# Patient Record
Sex: Female | Born: 1975 | Race: Black or African American | Hispanic: No | Marital: Married | Smoking: Never smoker
Health system: Southern US, Community
[De-identification: ages and names within clinical notes are randomized; demographics above are authoritative.]

## PROBLEM LIST (undated history)

## (undated) ENCOUNTER — Emergency Department (HOSPITAL_COMMUNITY): Admission: EM | Payer: Medicaid Other | Source: Home / Self Care

## (undated) DIAGNOSIS — M549 Dorsalgia, unspecified: Secondary | ICD-10-CM

## (undated) DIAGNOSIS — Z21 Asymptomatic human immunodeficiency virus [HIV] infection status: Secondary | ICD-10-CM

## (undated) DIAGNOSIS — E1129 Type 2 diabetes mellitus with other diabetic kidney complication: Secondary | ICD-10-CM

## (undated) DIAGNOSIS — R809 Proteinuria, unspecified: Secondary | ICD-10-CM

## (undated) DIAGNOSIS — B20 Human immunodeficiency virus [HIV] disease: Secondary | ICD-10-CM

## (undated) DIAGNOSIS — D849 Immunodeficiency, unspecified: Secondary | ICD-10-CM

## (undated) DIAGNOSIS — K8020 Calculus of gallbladder without cholecystitis without obstruction: Secondary | ICD-10-CM

## (undated) DIAGNOSIS — E119 Type 2 diabetes mellitus without complications: Secondary | ICD-10-CM

## (undated) HISTORY — DX: Type 2 diabetes mellitus without complications: E11.9

## (undated) HISTORY — DX: Dorsalgia, unspecified: M54.9

## (undated) HISTORY — PX: TUBAL LIGATION: SHX77

## (undated) HISTORY — DX: Asymptomatic human immunodeficiency virus (hiv) infection status: Z21

## (undated) HISTORY — DX: Type 2 diabetes mellitus with other diabetic kidney complication: R80.9

## (undated) HISTORY — DX: Type 2 diabetes mellitus with other diabetic kidney complication: E11.29

## (undated) HISTORY — PX: NO PAST SURGERIES: SHX2092

## (undated) HISTORY — DX: Calculus of gallbladder without cholecystitis without obstruction: K80.20

## (undated) HISTORY — DX: Human immunodeficiency virus (HIV) disease: B20

---

## 1998-10-16 ENCOUNTER — Inpatient Hospital Stay (HOSPITAL_COMMUNITY): Admission: AD | Admit: 1998-10-16 | Discharge: 1998-10-16 | Payer: Self-pay | Admitting: *Deleted

## 1998-12-11 ENCOUNTER — Ambulatory Visit (HOSPITAL_COMMUNITY): Admission: RE | Admit: 1998-12-11 | Discharge: 1998-12-11 | Payer: Self-pay | Admitting: *Deleted

## 1999-01-01 ENCOUNTER — Inpatient Hospital Stay (HOSPITAL_COMMUNITY): Admission: AD | Admit: 1999-01-01 | Discharge: 1999-01-01 | Payer: Self-pay | Admitting: Obstetrics

## 1999-01-28 ENCOUNTER — Encounter: Payer: Self-pay | Admitting: *Deleted

## 1999-01-28 ENCOUNTER — Inpatient Hospital Stay (HOSPITAL_COMMUNITY): Admission: AD | Admit: 1999-01-28 | Discharge: 1999-01-28 | Payer: Self-pay | Admitting: *Deleted

## 1999-01-31 ENCOUNTER — Encounter (HOSPITAL_COMMUNITY): Admission: RE | Admit: 1999-01-31 | Discharge: 1999-05-01 | Payer: Self-pay | Admitting: *Deleted

## 1999-02-06 ENCOUNTER — Encounter: Admission: RE | Admit: 1999-02-06 | Discharge: 1999-02-06 | Payer: Self-pay | Admitting: Obstetrics & Gynecology

## 1999-02-12 ENCOUNTER — Encounter: Admission: RE | Admit: 1999-02-12 | Discharge: 1999-02-12 | Payer: Self-pay | Admitting: Obstetrics & Gynecology

## 1999-02-19 ENCOUNTER — Encounter: Admission: RE | Admit: 1999-02-19 | Discharge: 1999-02-19 | Payer: Self-pay | Admitting: Obstetrics & Gynecology

## 1999-02-24 ENCOUNTER — Inpatient Hospital Stay (HOSPITAL_COMMUNITY): Admission: AD | Admit: 1999-02-24 | Discharge: 1999-02-24 | Payer: Self-pay | Admitting: Obstetrics & Gynecology

## 1999-02-24 ENCOUNTER — Encounter: Payer: Self-pay | Admitting: Obstetrics & Gynecology

## 1999-02-26 ENCOUNTER — Encounter: Admission: RE | Admit: 1999-02-26 | Discharge: 1999-02-26 | Payer: Self-pay | Admitting: Obstetrics & Gynecology

## 1999-02-28 ENCOUNTER — Inpatient Hospital Stay (HOSPITAL_COMMUNITY): Admission: AD | Admit: 1999-02-28 | Discharge: 1999-02-28 | Payer: Self-pay | Admitting: Obstetrics & Gynecology

## 1999-03-05 ENCOUNTER — Encounter: Admission: RE | Admit: 1999-03-05 | Discharge: 1999-03-05 | Payer: Self-pay | Admitting: Obstetrics & Gynecology

## 1999-03-06 ENCOUNTER — Inpatient Hospital Stay (HOSPITAL_COMMUNITY): Admission: AD | Admit: 1999-03-06 | Discharge: 1999-03-06 | Payer: Self-pay | Admitting: Obstetrics & Gynecology

## 1999-03-12 ENCOUNTER — Encounter: Admission: RE | Admit: 1999-03-12 | Discharge: 1999-03-12 | Payer: Self-pay | Admitting: Obstetrics & Gynecology

## 1999-03-26 ENCOUNTER — Encounter: Admission: RE | Admit: 1999-03-26 | Discharge: 1999-03-26 | Payer: Self-pay | Admitting: Obstetrics & Gynecology

## 1999-04-09 ENCOUNTER — Encounter: Admission: RE | Admit: 1999-04-09 | Discharge: 1999-04-09 | Payer: Self-pay | Admitting: Obstetrics & Gynecology

## 1999-04-16 ENCOUNTER — Encounter: Admission: RE | Admit: 1999-04-16 | Discharge: 1999-04-16 | Payer: Self-pay | Admitting: Obstetrics & Gynecology

## 1999-05-07 ENCOUNTER — Encounter: Admission: RE | Admit: 1999-05-07 | Discharge: 1999-05-07 | Payer: Self-pay | Admitting: Obstetrics & Gynecology

## 1999-05-12 ENCOUNTER — Inpatient Hospital Stay (HOSPITAL_COMMUNITY): Admission: AD | Admit: 1999-05-12 | Discharge: 1999-05-17 | Payer: Self-pay | Admitting: Obstetrics

## 1999-05-12 ENCOUNTER — Encounter (INDEPENDENT_AMBULATORY_CARE_PROVIDER_SITE_OTHER): Payer: Self-pay

## 1999-06-05 ENCOUNTER — Inpatient Hospital Stay (HOSPITAL_COMMUNITY): Admission: AD | Admit: 1999-06-05 | Discharge: 1999-06-05 | Payer: Self-pay | Admitting: Obstetrics & Gynecology

## 2000-02-02 ENCOUNTER — Inpatient Hospital Stay (HOSPITAL_COMMUNITY): Admission: AD | Admit: 2000-02-02 | Discharge: 2000-02-02 | Payer: Self-pay | Admitting: Obstetrics

## 2000-02-14 ENCOUNTER — Inpatient Hospital Stay (HOSPITAL_COMMUNITY): Admission: AD | Admit: 2000-02-14 | Discharge: 2000-02-14 | Payer: Self-pay | Admitting: Obstetrics

## 2000-03-19 ENCOUNTER — Inpatient Hospital Stay (HOSPITAL_COMMUNITY): Admission: EM | Admit: 2000-03-19 | Discharge: 2000-03-19 | Payer: Self-pay | Admitting: *Deleted

## 2000-04-01 ENCOUNTER — Inpatient Hospital Stay (HOSPITAL_COMMUNITY): Admission: AD | Admit: 2000-04-01 | Discharge: 2000-04-01 | Payer: Self-pay | Admitting: Obstetrics & Gynecology

## 2000-06-10 ENCOUNTER — Inpatient Hospital Stay (HOSPITAL_COMMUNITY): Admission: AD | Admit: 2000-06-10 | Discharge: 2000-06-10 | Payer: Self-pay | Admitting: Obstetrics & Gynecology

## 2000-07-12 ENCOUNTER — Inpatient Hospital Stay (HOSPITAL_COMMUNITY): Admission: AD | Admit: 2000-07-12 | Discharge: 2000-07-12 | Payer: Self-pay | Admitting: Obstetrics

## 2000-10-04 ENCOUNTER — Inpatient Hospital Stay (HOSPITAL_COMMUNITY): Admission: AD | Admit: 2000-10-04 | Discharge: 2000-10-04 | Payer: Self-pay | Admitting: Obstetrics

## 2000-10-08 ENCOUNTER — Inpatient Hospital Stay (HOSPITAL_COMMUNITY): Admission: AD | Admit: 2000-10-08 | Discharge: 2000-10-08 | Payer: Self-pay | Admitting: Obstetrics & Gynecology

## 2000-11-04 ENCOUNTER — Inpatient Hospital Stay (HOSPITAL_COMMUNITY): Admission: AD | Admit: 2000-11-04 | Discharge: 2000-11-04 | Payer: Self-pay | Admitting: Obstetrics & Gynecology

## 2001-01-19 ENCOUNTER — Inpatient Hospital Stay (HOSPITAL_COMMUNITY): Admission: AD | Admit: 2001-01-19 | Discharge: 2001-01-19 | Payer: Self-pay | Admitting: *Deleted

## 2001-01-26 ENCOUNTER — Inpatient Hospital Stay (HOSPITAL_COMMUNITY): Admission: AD | Admit: 2001-01-26 | Discharge: 2001-01-26 | Payer: Self-pay | Admitting: Obstetrics & Gynecology

## 2001-02-09 ENCOUNTER — Emergency Department (HOSPITAL_COMMUNITY): Admission: EM | Admit: 2001-02-09 | Discharge: 2001-02-09 | Payer: Self-pay | Admitting: Emergency Medicine

## 2001-03-18 ENCOUNTER — Inpatient Hospital Stay (HOSPITAL_COMMUNITY): Admission: AD | Admit: 2001-03-18 | Discharge: 2001-03-18 | Payer: Self-pay | Admitting: *Deleted

## 2001-03-18 ENCOUNTER — Encounter: Payer: Self-pay | Admitting: Obstetrics & Gynecology

## 2001-03-25 ENCOUNTER — Inpatient Hospital Stay (HOSPITAL_COMMUNITY): Admission: AD | Admit: 2001-03-25 | Discharge: 2001-03-25 | Payer: Self-pay | Admitting: Obstetrics & Gynecology

## 2001-04-01 ENCOUNTER — Inpatient Hospital Stay (HOSPITAL_COMMUNITY): Admission: RE | Admit: 2001-04-01 | Discharge: 2001-04-01 | Payer: Self-pay | Admitting: Obstetrics & Gynecology

## 2001-04-01 ENCOUNTER — Encounter: Payer: Self-pay | Admitting: Obstetrics & Gynecology

## 2001-04-12 ENCOUNTER — Inpatient Hospital Stay (HOSPITAL_COMMUNITY): Admission: AD | Admit: 2001-04-12 | Discharge: 2001-04-12 | Payer: Self-pay | Admitting: *Deleted

## 2001-05-09 ENCOUNTER — Inpatient Hospital Stay (HOSPITAL_COMMUNITY): Admission: AD | Admit: 2001-05-09 | Discharge: 2001-05-09 | Payer: Self-pay | Admitting: Obstetrics

## 2001-05-26 ENCOUNTER — Inpatient Hospital Stay (HOSPITAL_COMMUNITY): Admission: AD | Admit: 2001-05-26 | Discharge: 2001-05-26 | Payer: Self-pay | Admitting: *Deleted

## 2001-06-06 ENCOUNTER — Inpatient Hospital Stay (HOSPITAL_COMMUNITY): Admission: AD | Admit: 2001-06-06 | Discharge: 2001-06-06 | Payer: Self-pay

## 2001-06-14 ENCOUNTER — Inpatient Hospital Stay (HOSPITAL_COMMUNITY): Admission: AD | Admit: 2001-06-14 | Discharge: 2001-06-14 | Payer: Self-pay | Admitting: *Deleted

## 2001-06-17 ENCOUNTER — Ambulatory Visit (HOSPITAL_COMMUNITY): Admission: RE | Admit: 2001-06-17 | Discharge: 2001-06-17 | Payer: Self-pay | Admitting: *Deleted

## 2001-07-04 ENCOUNTER — Inpatient Hospital Stay (HOSPITAL_COMMUNITY): Admission: AD | Admit: 2001-07-04 | Discharge: 2001-07-04 | Payer: Self-pay | Admitting: Obstetrics

## 2001-07-08 ENCOUNTER — Inpatient Hospital Stay (HOSPITAL_COMMUNITY): Admission: AD | Admit: 2001-07-08 | Discharge: 2001-07-08 | Payer: Self-pay | Admitting: Obstetrics & Gynecology

## 2001-08-22 ENCOUNTER — Inpatient Hospital Stay (HOSPITAL_COMMUNITY): Admission: AD | Admit: 2001-08-22 | Discharge: 2001-08-22 | Payer: Self-pay | Admitting: Obstetrics

## 2001-09-07 ENCOUNTER — Inpatient Hospital Stay (HOSPITAL_COMMUNITY): Admission: AD | Admit: 2001-09-07 | Discharge: 2001-09-07 | Payer: Self-pay | Admitting: Obstetrics

## 2001-09-20 ENCOUNTER — Inpatient Hospital Stay (HOSPITAL_COMMUNITY): Admission: AD | Admit: 2001-09-20 | Discharge: 2001-09-20 | Payer: Self-pay | Admitting: *Deleted

## 2001-09-22 ENCOUNTER — Inpatient Hospital Stay (HOSPITAL_COMMUNITY): Admission: AD | Admit: 2001-09-22 | Discharge: 2001-09-22 | Payer: Self-pay | Admitting: *Deleted

## 2001-09-27 ENCOUNTER — Inpatient Hospital Stay (HOSPITAL_COMMUNITY): Admission: AD | Admit: 2001-09-27 | Discharge: 2001-09-27 | Payer: Self-pay | Admitting: *Deleted

## 2001-09-27 ENCOUNTER — Encounter: Payer: Self-pay | Admitting: *Deleted

## 2001-10-17 ENCOUNTER — Inpatient Hospital Stay (HOSPITAL_COMMUNITY): Admission: AD | Admit: 2001-10-17 | Discharge: 2001-10-17 | Payer: Self-pay | Admitting: *Deleted

## 2001-10-20 ENCOUNTER — Inpatient Hospital Stay (HOSPITAL_COMMUNITY): Admission: AD | Admit: 2001-10-20 | Discharge: 2001-10-20 | Payer: Self-pay | Admitting: *Deleted

## 2001-10-25 ENCOUNTER — Encounter (HOSPITAL_COMMUNITY): Admission: AD | Admit: 2001-10-25 | Discharge: 2001-11-08 | Payer: Self-pay | Admitting: *Deleted

## 2001-10-27 ENCOUNTER — Inpatient Hospital Stay (HOSPITAL_COMMUNITY): Admission: AD | Admit: 2001-10-27 | Discharge: 2001-10-27 | Payer: Self-pay | Admitting: *Deleted

## 2001-10-28 ENCOUNTER — Ambulatory Visit (HOSPITAL_COMMUNITY): Admission: RE | Admit: 2001-10-28 | Discharge: 2001-10-28 | Payer: Self-pay | Admitting: *Deleted

## 2001-10-28 ENCOUNTER — Encounter: Payer: Self-pay | Admitting: *Deleted

## 2001-11-09 ENCOUNTER — Inpatient Hospital Stay (HOSPITAL_COMMUNITY): Admission: AD | Admit: 2001-11-09 | Discharge: 2001-11-12 | Payer: Self-pay | Admitting: *Deleted

## 2001-11-09 ENCOUNTER — Encounter (INDEPENDENT_AMBULATORY_CARE_PROVIDER_SITE_OTHER): Payer: Self-pay

## 2001-11-17 ENCOUNTER — Inpatient Hospital Stay (HOSPITAL_COMMUNITY): Admission: AD | Admit: 2001-11-17 | Discharge: 2001-11-17 | Payer: Self-pay | Admitting: *Deleted

## 2001-12-13 ENCOUNTER — Inpatient Hospital Stay (HOSPITAL_COMMUNITY): Admission: AD | Admit: 2001-12-13 | Discharge: 2001-12-13 | Payer: Self-pay | Admitting: *Deleted

## 2002-05-15 ENCOUNTER — Inpatient Hospital Stay (HOSPITAL_COMMUNITY): Admission: AD | Admit: 2002-05-15 | Discharge: 2002-05-15 | Payer: Self-pay | Admitting: *Deleted

## 2002-06-13 ENCOUNTER — Inpatient Hospital Stay (HOSPITAL_COMMUNITY): Admission: AD | Admit: 2002-06-13 | Discharge: 2002-06-13 | Payer: Self-pay | Admitting: *Deleted

## 2002-07-06 ENCOUNTER — Inpatient Hospital Stay (HOSPITAL_COMMUNITY): Admission: AD | Admit: 2002-07-06 | Discharge: 2002-07-06 | Payer: Self-pay | Admitting: *Deleted

## 2002-07-12 ENCOUNTER — Emergency Department (HOSPITAL_COMMUNITY): Admission: EM | Admit: 2002-07-12 | Discharge: 2002-07-12 | Payer: Self-pay | Admitting: Emergency Medicine

## 2002-07-25 ENCOUNTER — Inpatient Hospital Stay (HOSPITAL_COMMUNITY): Admission: AD | Admit: 2002-07-25 | Discharge: 2002-07-25 | Payer: Self-pay | Admitting: *Deleted

## 2002-08-08 ENCOUNTER — Inpatient Hospital Stay (HOSPITAL_COMMUNITY): Admission: AD | Admit: 2002-08-08 | Discharge: 2002-08-08 | Payer: Self-pay | Admitting: *Deleted

## 2002-09-04 ENCOUNTER — Inpatient Hospital Stay (HOSPITAL_COMMUNITY): Admission: AD | Admit: 2002-09-04 | Discharge: 2002-09-04 | Payer: Self-pay | Admitting: *Deleted

## 2002-10-06 ENCOUNTER — Ambulatory Visit (HOSPITAL_COMMUNITY): Admission: RE | Admit: 2002-10-06 | Discharge: 2002-10-06 | Payer: Self-pay | Admitting: *Deleted

## 2002-11-20 ENCOUNTER — Other Ambulatory Visit: Admission: RE | Admit: 2002-11-20 | Discharge: 2002-11-20 | Payer: Self-pay | Admitting: Obstetrics and Gynecology

## 2003-01-24 ENCOUNTER — Inpatient Hospital Stay (HOSPITAL_COMMUNITY): Admission: AD | Admit: 2003-01-24 | Discharge: 2003-01-24 | Payer: Self-pay | Admitting: *Deleted

## 2003-01-27 ENCOUNTER — Inpatient Hospital Stay (HOSPITAL_COMMUNITY): Admission: AD | Admit: 2003-01-27 | Discharge: 2003-01-27 | Payer: Self-pay | Admitting: Obstetrics and Gynecology

## 2003-02-02 ENCOUNTER — Inpatient Hospital Stay (HOSPITAL_COMMUNITY): Admission: AD | Admit: 2003-02-02 | Discharge: 2003-02-02 | Payer: Self-pay | Admitting: Obstetrics and Gynecology

## 2003-02-07 ENCOUNTER — Inpatient Hospital Stay (HOSPITAL_COMMUNITY): Admission: AD | Admit: 2003-02-07 | Discharge: 2003-02-07 | Payer: Self-pay | Admitting: Obstetrics and Gynecology

## 2003-02-26 ENCOUNTER — Ambulatory Visit (HOSPITAL_COMMUNITY): Admission: RE | Admit: 2003-02-26 | Discharge: 2003-02-26 | Payer: Self-pay | Admitting: Obstetrics and Gynecology

## 2003-02-26 ENCOUNTER — Encounter: Payer: Self-pay | Admitting: Obstetrics and Gynecology

## 2003-03-14 ENCOUNTER — Inpatient Hospital Stay (HOSPITAL_COMMUNITY): Admission: AD | Admit: 2003-03-14 | Discharge: 2003-03-14 | Payer: Self-pay | Admitting: Obstetrics and Gynecology

## 2003-04-02 ENCOUNTER — Inpatient Hospital Stay (HOSPITAL_COMMUNITY): Admission: AD | Admit: 2003-04-02 | Discharge: 2003-04-02 | Payer: Self-pay | Admitting: Obstetrics and Gynecology

## 2003-04-11 ENCOUNTER — Inpatient Hospital Stay (HOSPITAL_COMMUNITY): Admission: AD | Admit: 2003-04-11 | Discharge: 2003-04-11 | Payer: Self-pay | Admitting: Obstetrics and Gynecology

## 2003-04-23 ENCOUNTER — Inpatient Hospital Stay (HOSPITAL_COMMUNITY): Admission: AD | Admit: 2003-04-23 | Discharge: 2003-04-23 | Payer: Self-pay | Admitting: Obstetrics and Gynecology

## 2003-04-24 ENCOUNTER — Inpatient Hospital Stay (HOSPITAL_COMMUNITY): Admission: AD | Admit: 2003-04-24 | Discharge: 2003-04-24 | Payer: Self-pay | Admitting: Obstetrics and Gynecology

## 2003-04-26 ENCOUNTER — Inpatient Hospital Stay (HOSPITAL_COMMUNITY): Admission: AD | Admit: 2003-04-26 | Discharge: 2003-05-01 | Payer: Self-pay | Admitting: Otolaryngology

## 2003-04-27 ENCOUNTER — Encounter (INDEPENDENT_AMBULATORY_CARE_PROVIDER_SITE_OTHER): Payer: Self-pay | Admitting: Specialist

## 2003-06-05 ENCOUNTER — Other Ambulatory Visit: Admission: RE | Admit: 2003-06-05 | Discharge: 2003-06-05 | Payer: Self-pay | Admitting: Obstetrics and Gynecology

## 2003-11-03 ENCOUNTER — Emergency Department (HOSPITAL_COMMUNITY): Admission: EM | Admit: 2003-11-03 | Discharge: 2003-11-04 | Payer: Self-pay | Admitting: Emergency Medicine

## 2003-11-09 ENCOUNTER — Inpatient Hospital Stay (HOSPITAL_COMMUNITY): Admission: AD | Admit: 2003-11-09 | Discharge: 2003-11-09 | Payer: Self-pay | Admitting: *Deleted

## 2004-02-20 ENCOUNTER — Inpatient Hospital Stay (HOSPITAL_COMMUNITY): Admission: AD | Admit: 2004-02-20 | Discharge: 2004-02-20 | Payer: Self-pay | Admitting: Obstetrics and Gynecology

## 2004-05-13 ENCOUNTER — Emergency Department (HOSPITAL_COMMUNITY): Admission: EM | Admit: 2004-05-13 | Discharge: 2004-05-13 | Payer: Self-pay | Admitting: Emergency Medicine

## 2004-08-04 ENCOUNTER — Inpatient Hospital Stay (HOSPITAL_COMMUNITY): Admission: AD | Admit: 2004-08-04 | Discharge: 2004-08-04 | Payer: Self-pay | Admitting: Obstetrics & Gynecology

## 2004-09-11 ENCOUNTER — Emergency Department (HOSPITAL_COMMUNITY): Admission: EM | Admit: 2004-09-11 | Discharge: 2004-09-11 | Payer: Self-pay | Admitting: Emergency Medicine

## 2004-10-01 ENCOUNTER — Emergency Department (HOSPITAL_COMMUNITY): Admission: EM | Admit: 2004-10-01 | Discharge: 2004-10-01 | Payer: Self-pay | Admitting: Family Medicine

## 2004-10-15 ENCOUNTER — Inpatient Hospital Stay (HOSPITAL_COMMUNITY): Admission: AD | Admit: 2004-10-15 | Discharge: 2004-10-15 | Payer: Self-pay | Admitting: Obstetrics and Gynecology

## 2004-12-02 ENCOUNTER — Emergency Department (HOSPITAL_COMMUNITY): Admission: EM | Admit: 2004-12-02 | Discharge: 2004-12-02 | Payer: Self-pay | Admitting: Emergency Medicine

## 2005-03-02 ENCOUNTER — Emergency Department (HOSPITAL_COMMUNITY): Admission: EM | Admit: 2005-03-02 | Discharge: 2005-03-02 | Payer: Self-pay | Admitting: Family Medicine

## 2005-04-02 ENCOUNTER — Emergency Department (HOSPITAL_COMMUNITY): Admission: EM | Admit: 2005-04-02 | Discharge: 2005-04-03 | Payer: Self-pay | Admitting: Emergency Medicine

## 2005-08-04 ENCOUNTER — Inpatient Hospital Stay (HOSPITAL_COMMUNITY): Admission: AD | Admit: 2005-08-04 | Discharge: 2005-08-04 | Payer: Self-pay | Admitting: *Deleted

## 2005-08-06 ENCOUNTER — Emergency Department (HOSPITAL_COMMUNITY): Admission: EM | Admit: 2005-08-06 | Discharge: 2005-08-06 | Payer: Self-pay | Admitting: Emergency Medicine

## 2006-03-11 ENCOUNTER — Emergency Department (HOSPITAL_COMMUNITY): Admission: EM | Admit: 2006-03-11 | Discharge: 2006-03-11 | Payer: Self-pay | Admitting: Emergency Medicine

## 2006-03-12 ENCOUNTER — Ambulatory Visit (HOSPITAL_COMMUNITY): Admission: RE | Admit: 2006-03-12 | Discharge: 2006-03-12 | Payer: Self-pay | Admitting: Emergency Medicine

## 2006-03-23 ENCOUNTER — Emergency Department (HOSPITAL_COMMUNITY): Admission: EM | Admit: 2006-03-23 | Discharge: 2006-03-23 | Payer: Self-pay | Admitting: Emergency Medicine

## 2006-03-25 ENCOUNTER — Emergency Department (HOSPITAL_COMMUNITY): Admission: EM | Admit: 2006-03-25 | Discharge: 2006-03-25 | Payer: Self-pay | Admitting: Family Medicine

## 2006-04-06 ENCOUNTER — Inpatient Hospital Stay (HOSPITAL_COMMUNITY): Admission: AD | Admit: 2006-04-06 | Discharge: 2006-04-06 | Payer: Self-pay

## 2006-05-24 ENCOUNTER — Inpatient Hospital Stay (HOSPITAL_COMMUNITY): Admission: AD | Admit: 2006-05-24 | Discharge: 2006-05-24 | Payer: Self-pay | Admitting: Family Medicine

## 2006-06-11 ENCOUNTER — Inpatient Hospital Stay (HOSPITAL_COMMUNITY): Admission: AD | Admit: 2006-06-11 | Discharge: 2006-06-11 | Payer: Self-pay | Admitting: Gynecology

## 2006-09-17 ENCOUNTER — Encounter: Admission: RE | Admit: 2006-09-17 | Discharge: 2006-09-17 | Payer: Self-pay | Admitting: Internal Medicine

## 2006-09-17 ENCOUNTER — Ambulatory Visit: Payer: Self-pay | Admitting: Internal Medicine

## 2006-09-17 ENCOUNTER — Encounter (INDEPENDENT_AMBULATORY_CARE_PROVIDER_SITE_OTHER): Payer: Self-pay | Admitting: *Deleted

## 2006-09-17 LAB — CONVERTED CEMR LAB
ALT: 27 units/L (ref 0–35)
AST: 30 units/L (ref 0–37)
Albumin: 4 g/dL (ref 3.5–5.2)
Alkaline Phosphatase: 73 units/L (ref 39–117)
BUN: 6 mg/dL (ref 6–23)
Basophils Absolute: 0 10*3/uL (ref 0.0–0.1)
Basophils Relative: 0 % (ref 0–1)
Bilirubin Urine: NEGATIVE
CD4 Count: 210 microliters
CD4 T Cell Abs: 210
CO2: 24 meq/L (ref 19–32)
Calcium: 8.9 mg/dL (ref 8.4–10.5)
Chlamydia, Swab/Urine, PCR: NEGATIVE
Chloride: 107 meq/L (ref 96–112)
Creatinine, Ser: 0.75 mg/dL (ref 0.40–1.20)
Eosinophils Absolute: 0 10*3/uL (ref 0.0–0.7)
Eosinophils Relative: 0 % (ref 0–5)
GC Probe Amp, Urine: NEGATIVE
Glucose, Bld: 100 mg/dL — ABNORMAL HIGH (ref 70–99)
HCT: 37.9 % (ref 36.0–46.0)
HCV Ab: NEGATIVE
HIV 1 RNA Quant: 29900 copies/mL
HIV 1 RNA Quant: 29900 copies/mL — ABNORMAL HIGH (ref <50)
HIV-1 RNA Quant, Log: 4.48 — ABNORMAL HIGH (ref <1.70)
HIV-1 antibody: POSITIVE — AB
HIV-2 Ab: UNDETERMINED — AB
HIV: REACTIVE
Hemoglobin, Urine: NEGATIVE
Hemoglobin: 11.7 g/dL — ABNORMAL LOW (ref 12.0–15.0)
Hep B Core Total Ab: NEGATIVE
Hep B S Ab: NEGATIVE
Hepatitis B Surface Ag: NEGATIVE
Ketones, ur: NEGATIVE mg/dL
Leukocytes, UA: NEGATIVE
Lymphocytes Relative: 47 % — ABNORMAL HIGH (ref 12–46)
Lymphs Abs: 1.1 10*3/uL (ref 0.7–3.3)
MCHC: 30.9 g/dL (ref 30.0–36.0)
MCV: 83.1 fL (ref 78.0–100.0)
Monocytes Absolute: 0.2 10*3/uL (ref 0.2–0.7)
Monocytes Relative: 10 % (ref 3–11)
Neutro Abs: 1 10*3/uL — ABNORMAL LOW (ref 1.7–7.7)
Neutrophils Relative %: 43 % (ref 43–77)
Nitrite: NEGATIVE
Platelets: 157 10*3/uL (ref 150–400)
Potassium: 3.9 meq/L (ref 3.5–5.3)
Protein, ur: NEGATIVE mg/dL
RBC: 4.56 M/uL (ref 3.87–5.11)
RDW: 13.1 % (ref 11.5–14.0)
Sodium: 138 meq/L (ref 135–145)
Specific Gravity, Urine: 1.03 (ref 1.005–1.03)
Total Bilirubin: 0.4 mg/dL (ref 0.3–1.2)
Total Protein: 8.8 g/dL — ABNORMAL HIGH (ref 6.0–8.3)
Urine Glucose: NEGATIVE mg/dL
Urobilinogen, UA: 1 (ref 0.0–1.0)
WBC: 2.3 10*3/uL — ABNORMAL LOW (ref 4.0–10.5)
pH: 6 (ref 5.0–8.0)

## 2006-09-29 ENCOUNTER — Ambulatory Visit: Payer: Self-pay | Admitting: Internal Medicine

## 2006-09-29 DIAGNOSIS — B20 Human immunodeficiency virus [HIV] disease: Secondary | ICD-10-CM

## 2006-09-29 HISTORY — DX: Human immunodeficiency virus (HIV) disease: B20

## 2006-10-06 ENCOUNTER — Ambulatory Visit: Payer: Self-pay | Admitting: Internal Medicine

## 2006-10-11 ENCOUNTER — Encounter (INDEPENDENT_AMBULATORY_CARE_PROVIDER_SITE_OTHER): Payer: Self-pay | Admitting: *Deleted

## 2006-10-11 LAB — CONVERTED CEMR LAB
CD4 Count: 0 microliters
HIV 1 RNA Quant: 0 copies/mL

## 2006-10-13 ENCOUNTER — Encounter: Payer: Self-pay | Admitting: Internal Medicine

## 2006-10-24 ENCOUNTER — Encounter (INDEPENDENT_AMBULATORY_CARE_PROVIDER_SITE_OTHER): Payer: Self-pay | Admitting: *Deleted

## 2006-11-09 ENCOUNTER — Encounter: Admission: RE | Admit: 2006-11-09 | Discharge: 2006-11-09 | Payer: Self-pay | Admitting: Internal Medicine

## 2006-11-09 ENCOUNTER — Ambulatory Visit: Payer: Self-pay | Admitting: Internal Medicine

## 2006-11-09 ENCOUNTER — Encounter: Payer: Self-pay | Admitting: Internal Medicine

## 2006-11-23 ENCOUNTER — Ambulatory Visit: Payer: Self-pay | Admitting: Internal Medicine

## 2006-12-22 ENCOUNTER — Ambulatory Visit: Payer: Self-pay | Admitting: Internal Medicine

## 2006-12-22 ENCOUNTER — Encounter: Admission: RE | Admit: 2006-12-22 | Discharge: 2006-12-22 | Payer: Self-pay | Admitting: Internal Medicine

## 2006-12-22 LAB — CONVERTED CEMR LAB
ALT: 17 units/L (ref 0–35)
AST: 20 units/L (ref 0–37)
Albumin: 3.6 g/dL (ref 3.5–5.2)
Alkaline Phosphatase: 74 units/L (ref 39–117)
BUN: 5 mg/dL — ABNORMAL LOW (ref 6–23)
Basophils Absolute: 0 10*3/uL (ref 0.0–0.1)
Basophils Relative: 0 % (ref 0–1)
CD4 Count: 200 microliters
CO2: 24 meq/L (ref 19–32)
Calcium: 9.2 mg/dL (ref 8.4–10.5)
Chloride: 105 meq/L (ref 96–112)
Creatinine, Ser: 0.83 mg/dL (ref 0.40–1.20)
Eosinophils Absolute: 0 10*3/uL (ref 0.0–0.7)
Eosinophils Relative: 1 % (ref 0–5)
Glucose, Bld: 98 mg/dL (ref 70–99)
HCT: 35.4 % — ABNORMAL LOW (ref 36.0–46.0)
HIV 1 RNA Quant: 241 copies/mL — ABNORMAL HIGH (ref <50)
HIV-1 RNA Quant, Log: 2.38 — ABNORMAL HIGH (ref <1.70)
Hemoglobin: 11.2 g/dL — ABNORMAL LOW (ref 12.0–15.0)
Lymphocytes Relative: 36 % (ref 12–46)
Lymphs Abs: 0.8 10*3/uL (ref 0.7–3.3)
MCHC: 31.6 g/dL (ref 30.0–36.0)
MCV: 83.7 fL (ref 78.0–100.0)
Monocytes Absolute: 0.1 10*3/uL — ABNORMAL LOW (ref 0.2–0.7)
Monocytes Relative: 6 % (ref 3–11)
Neutro Abs: 1.2 10*3/uL — ABNORMAL LOW (ref 1.7–7.7)
Neutrophils Relative %: 57 % (ref 43–77)
Platelets: 145 10*3/uL — ABNORMAL LOW (ref 150–400)
Potassium: 3.5 meq/L (ref 3.5–5.3)
RBC: 4.23 M/uL (ref 3.87–5.11)
RDW: 13.9 % (ref 11.5–14.0)
Sodium: 137 meq/L (ref 135–145)
Total Bilirubin: 2.8 mg/dL — ABNORMAL HIGH (ref 0.3–1.2)
Total Protein: 8.2 g/dL (ref 6.0–8.3)
WBC: 2.2 10*3/uL — ABNORMAL LOW (ref 4.0–10.5)

## 2007-01-12 ENCOUNTER — Ambulatory Visit: Payer: Self-pay | Admitting: Internal Medicine

## 2007-01-12 DIAGNOSIS — F329 Major depressive disorder, single episode, unspecified: Secondary | ICD-10-CM | POA: Insufficient documentation

## 2007-01-31 ENCOUNTER — Encounter: Payer: Self-pay | Admitting: Internal Medicine

## 2007-02-21 ENCOUNTER — Telehealth: Payer: Self-pay | Admitting: Internal Medicine

## 2007-02-21 ENCOUNTER — Inpatient Hospital Stay (HOSPITAL_COMMUNITY): Admission: AD | Admit: 2007-02-21 | Discharge: 2007-02-21 | Payer: Self-pay | Admitting: Obstetrics & Gynecology

## 2007-03-28 ENCOUNTER — Encounter: Admission: RE | Admit: 2007-03-28 | Discharge: 2007-03-28 | Payer: Self-pay | Admitting: Internal Medicine

## 2007-03-28 ENCOUNTER — Ambulatory Visit: Payer: Self-pay | Admitting: Internal Medicine

## 2007-03-28 LAB — CONVERTED CEMR LAB
ALT: 14 units/L (ref 0–35)
AST: 18 units/L (ref 0–37)
Albumin: 4 g/dL (ref 3.5–5.2)
Alkaline Phosphatase: 108 units/L (ref 39–117)
BUN: 12 mg/dL (ref 6–23)
Basophils Absolute: 0 10*3/uL (ref 0.0–0.1)
Basophils Relative: 0 % (ref 0–1)
CO2: 22 meq/L (ref 19–32)
Calcium: 8.7 mg/dL (ref 8.4–10.5)
Chloride: 106 meq/L (ref 96–112)
Creatinine, Ser: 0.75 mg/dL (ref 0.40–1.20)
Eosinophils Absolute: 0 10*3/uL (ref 0.0–0.7)
Eosinophils Relative: 1 % (ref 0–5)
Glucose, Bld: 91 mg/dL (ref 70–99)
HCT: 35.8 % — ABNORMAL LOW (ref 36.0–46.0)
HIV 1 RNA Quant: 288 copies/mL — ABNORMAL HIGH (ref <50)
HIV-1 RNA Quant, Log: 2.46 — ABNORMAL HIGH (ref <1.70)
Hemoglobin: 11.2 g/dL — ABNORMAL LOW (ref 12.0–15.0)
Lymphocytes Relative: 32 % (ref 12–46)
Lymphs Abs: 1.3 10*3/uL (ref 0.7–3.3)
MCHC: 31.3 g/dL (ref 30.0–36.0)
MCV: 83.4 fL (ref 78.0–100.0)
Monocytes Absolute: 0.3 10*3/uL (ref 0.2–0.7)
Monocytes Relative: 7 % (ref 3–11)
Neutro Abs: 2.5 10*3/uL (ref 1.7–7.7)
Neutrophils Relative %: 60 % (ref 43–77)
Platelets: 183 10*3/uL (ref 150–400)
Potassium: 4 meq/L (ref 3.5–5.3)
RBC: 4.29 M/uL (ref 3.87–5.11)
RDW: 13.7 % (ref 11.5–14.0)
Sodium: 138 meq/L (ref 135–145)
Total Bilirubin: 1 mg/dL (ref 0.3–1.2)
Total Protein: 8.4 g/dL — ABNORMAL HIGH (ref 6.0–8.3)
WBC: 4.2 10*3/uL (ref 4.0–10.5)

## 2007-04-11 ENCOUNTER — Encounter (INDEPENDENT_AMBULATORY_CARE_PROVIDER_SITE_OTHER): Payer: Self-pay | Admitting: *Deleted

## 2007-04-13 ENCOUNTER — Ambulatory Visit: Payer: Self-pay | Admitting: Internal Medicine

## 2007-07-07 ENCOUNTER — Encounter: Admission: RE | Admit: 2007-07-07 | Discharge: 2007-07-07 | Payer: Self-pay | Admitting: Internal Medicine

## 2007-07-07 ENCOUNTER — Ambulatory Visit: Payer: Self-pay | Admitting: Internal Medicine

## 2007-07-07 LAB — CONVERTED CEMR LAB
ALT: 20 units/L (ref 0–35)
AST: 22 units/L (ref 0–37)
Albumin: 4.2 g/dL (ref 3.5–5.2)
Alkaline Phosphatase: 128 units/L — ABNORMAL HIGH (ref 39–117)
BUN: 7 mg/dL (ref 6–23)
Basophils Absolute: 0 10*3/uL (ref 0.0–0.1)
Basophils Relative: 0 % (ref 0–1)
CO2: 21 meq/L (ref 19–32)
Calcium: 9.2 mg/dL (ref 8.4–10.5)
Chloride: 105 meq/L (ref 96–112)
Creatinine, Ser: 0.73 mg/dL (ref 0.40–1.20)
Eosinophils Absolute: 0 10*3/uL — ABNORMAL LOW (ref 0.2–0.7)
Eosinophils Relative: 1 % (ref 0–5)
Glucose, Bld: 130 mg/dL — ABNORMAL HIGH (ref 70–99)
HCT: 39.4 % (ref 36.0–46.0)
HIV 1 RNA Quant: <50 copies/mL (ref <50)
HIV-1 RNA Quant, Log: <1.7 (ref <1.70)
Hemoglobin: 12.6 g/dL (ref 12.0–15.0)
Lymphocytes Relative: 29 % (ref 12–46)
Lymphs Abs: 1.4 10*3/uL (ref 0.7–4.0)
MCHC: 32 g/dL (ref 30.0–36.0)
MCV: 82.1 fL (ref 78.0–100.0)
Monocytes Absolute: 0.3 10*3/uL (ref 0.1–1.0)
Monocytes Relative: 6 % (ref 3–12)
Neutro Abs: 2.9 10*3/uL (ref 1.7–7.7)
Neutrophils Relative %: 64 % (ref 43–77)
Platelets: 242 10*3/uL (ref 150–400)
Potassium: 4.1 meq/L (ref 3.5–5.3)
Preg, Serum: NEGATIVE
RBC: 4.8 M/uL (ref 3.87–5.11)
RDW: 13.8 % (ref 11.5–15.5)
Sodium: 138 meq/L (ref 135–145)
Total Bilirubin: 1.8 mg/dL — ABNORMAL HIGH (ref 0.3–1.2)
Total Protein: 8.9 g/dL — ABNORMAL HIGH (ref 6.0–8.3)
WBC: 4.6 10*3/uL (ref 4.0–10.5)

## 2007-07-26 ENCOUNTER — Ambulatory Visit: Payer: Self-pay | Admitting: Internal Medicine

## 2007-07-27 ENCOUNTER — Encounter: Payer: Self-pay | Admitting: Internal Medicine

## 2007-08-15 ENCOUNTER — Encounter: Payer: Self-pay | Admitting: Internal Medicine

## 2007-11-21 ENCOUNTER — Inpatient Hospital Stay (HOSPITAL_COMMUNITY): Admission: AD | Admit: 2007-11-21 | Discharge: 2007-11-21 | Payer: Self-pay | Admitting: Gynecology

## 2007-12-05 ENCOUNTER — Ambulatory Visit: Payer: Self-pay | Admitting: Internal Medicine

## 2007-12-05 ENCOUNTER — Encounter: Admission: RE | Admit: 2007-12-05 | Discharge: 2007-12-05 | Payer: Self-pay | Admitting: Internal Medicine

## 2007-12-05 LAB — CONVERTED CEMR LAB
ALT: 36 units/L — ABNORMAL HIGH (ref 0–35)
AST: 36 units/L (ref 0–37)
Albumin: 4.3 g/dL (ref 3.5–5.2)
Alkaline Phosphatase: 112 units/L (ref 39–117)
BUN: 8 mg/dL (ref 6–23)
Basophils Absolute: 0 10*3/uL (ref 0.0–0.1)
Basophils Relative: 0 % (ref 0–1)
CO2: 22 meq/L (ref 19–32)
Calcium: 9.4 mg/dL (ref 8.4–10.5)
Chloride: 105 meq/L (ref 96–112)
Creatinine, Ser: 0.74 mg/dL (ref 0.40–1.20)
Eosinophils Absolute: 0 10*3/uL (ref 0.0–0.7)
Eosinophils Relative: 1 % (ref 0–5)
Glucose, Bld: 104 mg/dL — ABNORMAL HIGH (ref 70–99)
HCT: 40.2 % (ref 36.0–46.0)
HIV 1 RNA Quant: 120 copies/mL — ABNORMAL HIGH (ref <50)
HIV-1 RNA Quant, Log: 2.08 — ABNORMAL HIGH (ref <1.70)
Hemoglobin: 12.6 g/dL (ref 12.0–15.0)
Lymphocytes Relative: 31 % (ref 12–46)
Lymphs Abs: 1.6 10*3/uL (ref 0.7–4.0)
MCHC: 31.3 g/dL (ref 30.0–36.0)
MCV: 83.2 fL (ref 78.0–100.0)
Monocytes Absolute: 0.4 10*3/uL (ref 0.1–1.0)
Monocytes Relative: 7 % (ref 3–12)
Neutro Abs: 3.3 10*3/uL (ref 1.7–7.7)
Neutrophils Relative %: 61 % (ref 43–77)
Platelets: 222 10*3/uL (ref 150–400)
Potassium: 3.8 meq/L (ref 3.5–5.3)
RBC: 4.83 M/uL (ref 3.87–5.11)
RDW: 13 % (ref 11.5–15.5)
Sodium: 139 meq/L (ref 135–145)
Total Bilirubin: 0.9 mg/dL (ref 0.3–1.2)
Total Protein: 8.7 g/dL — ABNORMAL HIGH (ref 6.0–8.3)
WBC: 5.4 10*3/uL (ref 4.0–10.5)

## 2007-12-20 ENCOUNTER — Ambulatory Visit: Payer: Self-pay | Admitting: Internal Medicine

## 2008-02-13 ENCOUNTER — Emergency Department (HOSPITAL_COMMUNITY): Admission: EM | Admit: 2008-02-13 | Discharge: 2008-02-13 | Payer: Self-pay | Admitting: Emergency Medicine

## 2008-02-14 ENCOUNTER — Observation Stay (HOSPITAL_COMMUNITY): Admission: EM | Admit: 2008-02-14 | Discharge: 2008-02-14 | Payer: Self-pay | Admitting: Emergency Medicine

## 2008-03-19 ENCOUNTER — Ambulatory Visit: Payer: Self-pay | Admitting: Internal Medicine

## 2008-03-19 ENCOUNTER — Encounter: Admission: RE | Admit: 2008-03-19 | Discharge: 2008-03-19 | Payer: Self-pay | Admitting: Internal Medicine

## 2008-03-19 LAB — CONVERTED CEMR LAB
ALT: 48 units/L — ABNORMAL HIGH (ref 0–35)
AST: 43 units/L — ABNORMAL HIGH (ref 0–37)
Albumin: 4.1 g/dL (ref 3.5–5.2)
Alkaline Phosphatase: 75 units/L (ref 39–117)
BUN: 5 mg/dL — ABNORMAL LOW (ref 6–23)
Basophils Absolute: 0 10*3/uL (ref 0.0–0.1)
Basophils Relative: 0 % (ref 0–1)
CO2: 21 meq/L (ref 19–32)
Calcium: 9.1 mg/dL (ref 8.4–10.5)
Chloride: 107 meq/L (ref 96–112)
Creatinine, Ser: 0.69 mg/dL (ref 0.40–1.20)
Eosinophils Absolute: 0 10*3/uL (ref 0.0–0.7)
Eosinophils Relative: 1 % (ref 0–5)
Glucose, Bld: 98 mg/dL (ref 70–99)
HCT: 35.9 % — ABNORMAL LOW (ref 36.0–46.0)
HIV 1 RNA Quant: 139 copies/mL — ABNORMAL HIGH (ref <50)
HIV-1 RNA Quant, Log: 2.14 — ABNORMAL HIGH (ref <1.70)
Hemoglobin: 11.5 g/dL — ABNORMAL LOW (ref 12.0–15.0)
Lymphocytes Relative: 40 % (ref 12–46)
Lymphs Abs: 1.3 10*3/uL (ref 0.7–4.0)
MCHC: 32 g/dL (ref 30.0–36.0)
MCV: 82.7 fL (ref 78.0–100.0)
Monocytes Absolute: 0.2 10*3/uL (ref 0.1–1.0)
Monocytes Relative: 7 % (ref 3–12)
Neutro Abs: 1.6 10*3/uL — ABNORMAL LOW (ref 1.7–7.7)
Neutrophils Relative %: 52 % (ref 43–77)
Platelets: 168 10*3/uL (ref 150–400)
Potassium: 3.9 meq/L (ref 3.5–5.3)
RBC: 4.34 M/uL (ref 3.87–5.11)
RDW: 13.2 % (ref 11.5–15.5)
Sodium: 139 meq/L (ref 135–145)
Total Bilirubin: 0.5 mg/dL (ref 0.3–1.2)
Total Protein: 7.9 g/dL (ref 6.0–8.3)
WBC: 3.2 10*3/uL — ABNORMAL LOW (ref 4.0–10.5)

## 2008-04-10 ENCOUNTER — Encounter: Payer: Self-pay | Admitting: Internal Medicine

## 2008-04-11 ENCOUNTER — Ambulatory Visit: Payer: Self-pay | Admitting: Internal Medicine

## 2008-07-04 ENCOUNTER — Ambulatory Visit: Payer: Self-pay | Admitting: Internal Medicine

## 2008-07-04 LAB — CONVERTED CEMR LAB
ALT: 83 units/L — ABNORMAL HIGH (ref 0–35)
AST: 102 units/L — ABNORMAL HIGH (ref 0–37)
Albumin: 4.1 g/dL (ref 3.5–5.2)
Alkaline Phosphatase: 94 units/L (ref 39–117)
BUN: 6 mg/dL (ref 6–23)
Basophils Absolute: 0 10*3/uL (ref 0.0–0.1)
Basophils Relative: 1 % (ref 0–1)
CO2: 21 meq/L (ref 19–32)
Calcium: 8.9 mg/dL (ref 8.4–10.5)
Chloride: 105 meq/L (ref 96–112)
Creatinine, Ser: 0.83 mg/dL (ref 0.40–1.20)
Eosinophils Absolute: 0 10*3/uL (ref 0.0–0.7)
Eosinophils Relative: 1 % (ref 0–5)
Glucose, Bld: 218 mg/dL — ABNORMAL HIGH (ref 70–99)
HCT: 39.6 % (ref 36.0–46.0)
HIV 1 RNA Quant: 65 copies/mL — ABNORMAL HIGH (ref <48)
HIV-1 RNA Quant, Log: 1.81 — ABNORMAL HIGH (ref <1.68)
Hemoglobin: 11.9 g/dL — ABNORMAL LOW (ref 12.0–15.0)
Lymphocytes Relative: 41 % (ref 12–46)
Lymphs Abs: 1.5 10*3/uL (ref 0.7–4.0)
MCHC: 30.1 g/dL (ref 30.0–36.0)
MCV: 85.3 fL (ref 78.0–100.0)
Monocytes Absolute: 0.2 10*3/uL (ref 0.1–1.0)
Monocytes Relative: 6 % (ref 3–12)
Neutro Abs: 1.8 10*3/uL (ref 1.7–7.7)
Neutrophils Relative %: 51 % (ref 43–77)
Platelets: 159 10*3/uL (ref 150–400)
Potassium: 3.9 meq/L (ref 3.5–5.3)
RBC: 4.64 M/uL (ref 3.87–5.11)
RDW: 13.3 % (ref 11.5–15.5)
Sodium: 139 meq/L (ref 135–145)
Total Bilirubin: 0.4 mg/dL (ref 0.3–1.2)
Total Protein: 7.9 g/dL (ref 6.0–8.3)
WBC: 3.6 10*3/uL — ABNORMAL LOW (ref 4.0–10.5)

## 2008-08-15 ENCOUNTER — Ambulatory Visit: Payer: Self-pay | Admitting: Internal Medicine

## 2008-08-15 DIAGNOSIS — R7309 Other abnormal glucose: Secondary | ICD-10-CM | POA: Insufficient documentation

## 2008-08-22 ENCOUNTER — Encounter: Payer: Self-pay | Admitting: Internal Medicine

## 2008-08-22 ENCOUNTER — Ambulatory Visit: Payer: Self-pay | Admitting: Internal Medicine

## 2008-08-28 ENCOUNTER — Encounter: Payer: Self-pay | Admitting: Internal Medicine

## 2008-09-03 ENCOUNTER — Encounter: Payer: Self-pay | Admitting: Internal Medicine

## 2008-09-04 ENCOUNTER — Encounter: Payer: Self-pay | Admitting: Internal Medicine

## 2008-11-13 ENCOUNTER — Ambulatory Visit: Payer: Self-pay | Admitting: Internal Medicine

## 2008-11-13 LAB — CONVERTED CEMR LAB
Albumin: 4 g/dL (ref 3.5–5.2)
Alkaline Phosphatase: 105 units/L (ref 39–117)
Basophils Absolute: 0 10*3/uL (ref 0.0–0.1)
Basophils Relative: 1 % (ref 0–1)
CO2: 23 meq/L (ref 19–32)
Eosinophils Relative: 2 % (ref 0–5)
GFR calc Af Amer: >60 mL/min (ref >60)
GFR calc non Af Amer: >60 mL/min (ref >60)
Glucose, Bld: 112 mg/dL — ABNORMAL HIGH (ref 70–99)
HCT: 33 % — ABNORMAL LOW (ref 36.0–46.0)
HIV-1 RNA Quant, Log: 2.05 — ABNORMAL HIGH (ref <1.68)
Lymphocytes Relative: 37 % (ref 12–46)
MCHC: 30.9 g/dL (ref 30.0–36.0)
Platelets: 157 10*3/uL (ref 150–400)
Potassium: 4 meq/L (ref 3.5–5.3)
RDW: 12.9 % (ref 11.5–15.5)
Sodium: 141 meq/L (ref 135–145)
Total Protein: 8.3 g/dL (ref 6.0–8.3)

## 2009-03-07 ENCOUNTER — Ambulatory Visit: Payer: Self-pay | Admitting: Internal Medicine

## 2009-03-07 LAB — CONVERTED CEMR LAB
ALT: 48 units/L — ABNORMAL HIGH (ref 0–35)
BUN: 9 mg/dL (ref 6–23)
CO2: 24 meq/L (ref 19–32)
Creatinine, Ser: 0.99 mg/dL (ref 0.40–1.20)
Eosinophils Absolute: 0 10*3/uL (ref 0.0–0.7)
Eosinophils Relative: 1 % (ref 0–5)
Glucose, Bld: 107 mg/dL — ABNORMAL HIGH (ref 70–99)
HCT: 34.2 % — ABNORMAL LOW (ref 36.0–46.0)
HDL: 39 mg/dL — ABNORMAL LOW (ref >39)
HIV 1 RNA Quant: 5780 copies/mL — ABNORMAL HIGH (ref <48)
Hemoglobin: 11.1 g/dL — ABNORMAL LOW (ref 12.0–15.0)
LDL Cholesterol: 65 mg/dL (ref 0–99)
Lymphocytes Relative: 39 % (ref 12–46)
Lymphs Abs: 1.5 10*3/uL (ref 0.7–4.0)
MCV: 80.3 fL (ref 78.0–100.0)
Monocytes Absolute: 0.3 10*3/uL (ref 0.1–1.0)
Monocytes Relative: 7 % (ref 3–12)
RBC: 4.26 M/uL (ref 3.87–5.11)
Total Bilirubin: 0.4 mg/dL (ref 0.3–1.2)
Total CHOL/HDL Ratio: 3.4
Triglycerides: 140 mg/dL (ref <150)
WBC: 3.9 10*3/uL — ABNORMAL LOW (ref 4.0–10.5)

## 2009-03-22 ENCOUNTER — Ambulatory Visit: Payer: Self-pay | Admitting: Internal Medicine

## 2009-05-22 ENCOUNTER — Ambulatory Visit: Payer: Self-pay | Admitting: Internal Medicine

## 2009-05-22 LAB — CONVERTED CEMR LAB
AST: 35 units/L (ref 0–37)
Albumin: 3.6 g/dL (ref 3.5–5.2)
Alkaline Phosphatase: 72 units/L (ref 39–117)
BUN: 7 mg/dL (ref 6–23)
Basophils Absolute: 0 10*3/uL (ref 0.0–0.1)
Calcium: 8.7 mg/dL (ref 8.4–10.5)
Creatinine, Ser: 0.7 mg/dL (ref 0.40–1.20)
Eosinophils Relative: 0 % (ref 0–5)
Glucose, Bld: 126 mg/dL — ABNORMAL HIGH (ref 70–99)
HCT: 34.2 % — ABNORMAL LOW (ref 36.0–46.0)
HIV 1 RNA Quant: 7760 copies/mL — ABNORMAL HIGH (ref <48)
HIV-1 RNA Quant, Log: 3.89 — ABNORMAL HIGH (ref <1.68)
Hemoglobin: 10.2 g/dL — ABNORMAL LOW (ref 12.0–15.0)
Lymphocytes Relative: 46 % (ref 12–46)
Monocytes Absolute: 0.3 10*3/uL (ref 0.1–1.0)
Monocytes Relative: 11 % (ref 3–12)
Neutro Abs: 1.1 10*3/uL — ABNORMAL LOW (ref 1.7–7.7)
RBC: 3.99 M/uL (ref 3.87–5.11)
RDW: 13.4 % (ref 11.5–15.5)

## 2009-06-12 ENCOUNTER — Ambulatory Visit: Payer: Self-pay | Admitting: Internal Medicine

## 2009-07-29 ENCOUNTER — Ambulatory Visit: Payer: Self-pay | Admitting: Internal Medicine

## 2009-07-29 LAB — CONVERTED CEMR LAB
Albumin: 3.9 g/dL (ref 3.5–5.2)
Alkaline Phosphatase: 90 units/L (ref 39–117)
BUN: 9 mg/dL (ref 6–23)
Basophils Absolute: 0 10*3/uL (ref 0.0–0.1)
CO2: 22 meq/L (ref 19–32)
Calcium: 9.2 mg/dL (ref 8.4–10.5)
Chloride: 104 meq/L (ref 96–112)
Eosinophils Relative: 1 % (ref 0–5)
Glucose, Bld: 133 mg/dL — ABNORMAL HIGH (ref 70–99)
HCT: 37.8 % (ref 36.0–46.0)
HIV-1 RNA Quant, Log: 1.92 — ABNORMAL HIGH (ref <1.68)
Hemoglobin: 11.8 g/dL — ABNORMAL LOW (ref 12.0–15.0)
Lymphocytes Relative: 39 % (ref 12–46)
Lymphs Abs: 1.6 10*3/uL (ref 0.7–4.0)
Monocytes Absolute: 0.3 10*3/uL (ref 0.1–1.0)
Monocytes Relative: 6 % (ref 3–12)
Neutro Abs: 2.2 10*3/uL (ref 1.7–7.7)
Potassium: 3.9 meq/L (ref 3.5–5.3)
RBC: 4.6 M/uL (ref 3.87–5.11)
Sodium: 137 meq/L (ref 135–145)
Total Protein: 9.1 g/dL — ABNORMAL HIGH (ref 6.0–8.3)
WBC: 4 10*3/uL (ref 4.0–10.5)

## 2009-08-02 ENCOUNTER — Ambulatory Visit: Payer: Self-pay | Admitting: Internal Medicine

## 2009-09-09 ENCOUNTER — Emergency Department (HOSPITAL_COMMUNITY): Admission: EM | Admit: 2009-09-09 | Discharge: 2009-09-09 | Payer: Self-pay | Admitting: Emergency Medicine

## 2009-09-13 ENCOUNTER — Encounter: Payer: Self-pay | Admitting: Internal Medicine

## 2009-10-04 ENCOUNTER — Encounter: Payer: Self-pay | Admitting: Internal Medicine

## 2009-10-10 ENCOUNTER — Emergency Department (HOSPITAL_COMMUNITY): Admission: EM | Admit: 2009-10-10 | Discharge: 2009-10-10 | Payer: Self-pay | Admitting: Family Medicine

## 2009-11-07 ENCOUNTER — Ambulatory Visit: Payer: Self-pay | Admitting: Internal Medicine

## 2009-11-07 ENCOUNTER — Emergency Department (HOSPITAL_COMMUNITY): Admission: EM | Admit: 2009-11-07 | Discharge: 2009-11-07 | Payer: Self-pay | Admitting: Emergency Medicine

## 2009-11-07 LAB — CONVERTED CEMR LAB
AST: 18 units/L (ref 0–37)
Alkaline Phosphatase: 83 units/L (ref 39–117)
BUN: 10 mg/dL (ref 6–23)
Basophils Absolute: 0 10*3/uL (ref 0.0–0.1)
Creatinine, Ser: 0.89 mg/dL (ref 0.40–1.20)
Eosinophils Absolute: 0 10*3/uL (ref 0.0–0.7)
Eosinophils Relative: 1 % (ref 0–5)
HCT: 35.5 % — ABNORMAL LOW (ref 36.0–46.0)
Lymphocytes Relative: 26 % (ref 12–46)
Neutrophils Relative %: 66 % (ref 43–77)
Platelets: 174 10*3/uL (ref 150–400)
RDW: 13.3 % (ref 11.5–15.5)

## 2009-12-13 ENCOUNTER — Encounter (INDEPENDENT_AMBULATORY_CARE_PROVIDER_SITE_OTHER): Payer: Self-pay | Admitting: *Deleted

## 2009-12-18 ENCOUNTER — Ambulatory Visit: Payer: Self-pay | Admitting: Internal Medicine

## 2010-01-24 ENCOUNTER — Telehealth (INDEPENDENT_AMBULATORY_CARE_PROVIDER_SITE_OTHER): Payer: Self-pay | Admitting: *Deleted

## 2010-01-24 ENCOUNTER — Ambulatory Visit: Payer: Self-pay | Admitting: Internal Medicine

## 2010-01-24 LAB — CONVERTED CEMR LAB: Pap Smear: NEGATIVE

## 2010-01-28 ENCOUNTER — Encounter: Payer: Self-pay | Admitting: Internal Medicine

## 2010-01-31 ENCOUNTER — Encounter: Payer: Self-pay | Admitting: Internal Medicine

## 2010-02-03 ENCOUNTER — Telehealth (INDEPENDENT_AMBULATORY_CARE_PROVIDER_SITE_OTHER): Payer: Self-pay | Admitting: *Deleted

## 2010-05-13 ENCOUNTER — Ambulatory Visit: Payer: Self-pay | Admitting: Internal Medicine

## 2010-05-13 LAB — CONVERTED CEMR LAB
CO2: 23 meq/L (ref 19–32)
Creatinine, Ser: 0.74 mg/dL (ref 0.40–1.20)
Eosinophils Relative: 1 % (ref 0–5)
Glucose, Bld: 86 mg/dL (ref 70–99)
HCT: 33.8 % — ABNORMAL LOW (ref 36.0–46.0)
HIV 1 RNA Quant: 43 copies/mL — ABNORMAL HIGH (ref <20)
HIV-1 RNA Quant, Log: 1.63 — ABNORMAL HIGH (ref <1.30)
Hemoglobin: 10.5 g/dL — ABNORMAL LOW (ref 12.0–15.0)
Lymphocytes Relative: 47 % — ABNORMAL HIGH (ref 12–46)
Lymphs Abs: 1.6 10*3/uL (ref 0.7–4.0)
Monocytes Absolute: 0.2 10*3/uL (ref 0.1–1.0)
Monocytes Relative: 7 % (ref 3–12)
RBC: 4.13 M/uL (ref 3.87–5.11)
Sodium: 141 meq/L (ref 135–145)
Total Bilirubin: 0.3 mg/dL (ref 0.3–1.2)
Total Protein: 7.6 g/dL (ref 6.0–8.3)
WBC: 3.4 10*3/uL — ABNORMAL LOW (ref 4.0–10.5)

## 2010-05-28 ENCOUNTER — Ambulatory Visit: Payer: Self-pay | Admitting: Internal Medicine

## 2010-08-26 ENCOUNTER — Encounter: Payer: Self-pay | Admitting: Internal Medicine

## 2010-08-26 ENCOUNTER — Ambulatory Visit
Admission: RE | Admit: 2010-08-26 | Discharge: 2010-08-26 | Payer: Self-pay | Source: Home / Self Care | Attending: Internal Medicine | Admitting: Internal Medicine

## 2010-08-26 LAB — CONVERTED CEMR LAB
ALT: 21 units/L (ref 0–35)
AST: 27 units/L (ref 0–37)
Albumin: 4 g/dL (ref 3.5–5.2)
Alkaline Phosphatase: 91 units/L (ref 39–117)
BUN: 7 mg/dL (ref 6–23)
Basophils Absolute: 0 10*3/uL (ref 0.0–0.1)
Basophils Relative: 1 % (ref 0–1)
CO2: 27 meq/L (ref 19–32)
Calcium: 9.2 mg/dL (ref 8.4–10.5)
Chloride: 105 meq/L (ref 96–112)
Creatinine, Ser: 0.74 mg/dL (ref 0.40–1.20)
Eosinophils Absolute: 0 10*3/uL (ref 0.0–0.7)
Eosinophils Relative: 1 % (ref 0–5)
Glucose, Bld: 121 mg/dL — ABNORMAL HIGH (ref 70–99)
HCT: 36.2 % (ref 36.0–46.0)
HIV 1 RNA Quant: <20 copies/mL (ref <20)
HIV-1 RNA Quant, Log: <1.3 (ref <1.30)
Hemoglobin: 11.4 g/dL — ABNORMAL LOW (ref 12.0–15.0)
Lymphocytes Relative: 37 % (ref 12–46)
Lymphs Abs: 1.5 10*3/uL (ref 0.7–4.0)
MCHC: 31.5 g/dL (ref 30.0–36.0)
MCV: 82.6 fL (ref 78.0–100.0)
Monocytes Absolute: 0.3 10*3/uL (ref 0.1–1.0)
Monocytes Relative: 7 % (ref 3–12)
Neutro Abs: 2.2 10*3/uL (ref 1.7–7.7)
Neutrophils Relative %: 54 % (ref 43–77)
Platelets: 165 10*3/uL (ref 150–400)
Potassium: 3.9 meq/L (ref 3.5–5.3)
RBC: 4.38 M/uL (ref 3.87–5.11)
RDW: 13.3 % (ref 11.5–15.5)
Sodium: 141 meq/L (ref 135–145)
Total Bilirubin: 0.3 mg/dL (ref 0.3–1.2)
Total Protein: 8.1 g/dL (ref 6.0–8.3)
WBC: 4.1 10*3/uL (ref 4.0–10.5)

## 2010-09-01 LAB — T-HELPER CELL (CD4) - (RCID CLINIC ONLY)
CD4 % Helper T Cell: 30 % — ABNORMAL LOW (ref 33–55)
CD4 T Cell Abs: 460 uL (ref 400–2700)

## 2010-09-11 ENCOUNTER — Ambulatory Visit
Admission: RE | Admit: 2010-09-11 | Discharge: 2010-09-11 | Payer: Self-pay | Source: Home / Self Care | Attending: Internal Medicine | Admitting: Internal Medicine

## 2010-09-14 LAB — CONVERTED CEMR LAB
ALT: 20 units/L (ref 0–35)
AST: 26 units/L (ref 0–37)
Albumin: 3.8 g/dL (ref 3.5–5.2)
Alkaline Phosphatase: 66 units/L (ref 39–117)
BUN: 7 mg/dL (ref 6–23)
Basophils Absolute: 0 10*3/uL (ref 0.0–0.1)
Basophils Relative: 0 % (ref 0–1)
CD4 Count: 200 microliters
CO2: 23 meq/L (ref 19–32)
Calcium: 9 mg/dL (ref 8.4–10.5)
Chloride: 108 meq/L (ref 96–112)
Creatinine, Ser: 0.84 mg/dL (ref 0.40–1.20)
Eosinophils Absolute: 0 10*3/uL (ref 0.0–0.7)
Eosinophils Relative: 0 % (ref 0–5)
Glucose, Bld: 109 mg/dL — ABNORMAL HIGH (ref 70–99)
HCT: 37.5 % (ref 36.0–46.0)
HIV 1 RNA Quant: 21600 copies/mL — ABNORMAL HIGH (ref <50)
HIV-1 RNA Quant, Log: 4.33 — ABNORMAL HIGH (ref <1.70)
Hemoglobin: 11.7 g/dL — ABNORMAL LOW (ref 12.0–15.0)
Lymphocytes Relative: 43 % (ref 12–46)
Lymphs Abs: 1 10*3/uL (ref 0.7–3.3)
MCHC: 31.2 g/dL (ref 30.0–36.0)
MCV: 84.3 fL (ref 78.0–100.0)
Monocytes Absolute: 0.2 10*3/uL (ref 0.2–0.7)
Monocytes Relative: 11 % (ref 3–11)
Neutro Abs: 1 10*3/uL — ABNORMAL LOW (ref 1.7–7.7)
Neutrophils Relative %: 45 % (ref 43–77)
Platelets: 164 10*3/uL (ref 150–400)
Potassium: 3.9 meq/L (ref 3.5–5.3)
RBC: 4.45 M/uL (ref 3.87–5.11)
RDW: 13.7 % (ref 11.5–14.0)
Sodium: 139 meq/L (ref 135–145)
Total Bilirubin: 0.4 mg/dL (ref 0.3–1.2)
Total Protein: 8.8 g/dL — ABNORMAL HIGH (ref 6.0–8.3)
WBC: 2.2 10*3/uL — ABNORMAL LOW (ref 4.0–10.5)

## 2010-09-18 NOTE — Miscellaneous (Signed)
Summary: Orders Update - labs  Clinical Lists Changes  Orders: Added new Test order of T-CBC w/Diff [phone]) - Signed Added new Test order of T-CD4SP [facility]) (CD4SP) - Signed Added new Test order of T-Comprehensive Metabolic Panel [id number]) - Signed Added new Test order of T-HIV Viral Load [phone]) - Signed     Process Orders Check Orders Results:     [facility]: ABN not required for this insurance Order queued for requisitioning for [facility]: November 07, 2009 3:13 PM  Tests Sent for requisitioning (November 07, 2009 3:13 PM):     11/07/2009: [facility] -- T-CBC w/Diff [[id number]] (signed)     11/07/2009: [facility] -- T-Comprehensive Metabolic Panel [[id number]] (signed)     11/07/2009: [facility] -- T-HIV Viral Load [phone] (signed)

## 2010-09-18 NOTE — Miscellaneous (Signed)
Summary: RW Update  Clinical Lists Changes  Observations: Added new observation of YEARAIDSPOS: 2010  (10/04/2009 14:52) Added new observation of HIV STATUS: CDC-defined AIDS  (10/04/2009 14:52)

## 2010-09-18 NOTE — Letter (Signed)
Summary: Generic Letter  [facility]  [address]   [place], [place] [address]   Phone: [phone]  Fax: [phone]          Minda Ditto  RILEA ARUTYUNYAN [id number] [facility] [place], [place]  [address]  Dear Ms. Allison Quarry,  It is time to call the clinic for your annual PAP Smear Appointment.  Please call the number above to schedule this important annual test.   I look forward to see you soon.  Sincerely,    Jennet Maduro RN Infectious Disease Clinic [facility]

## 2010-09-18 NOTE — Assessment & Plan Note (Signed)
Summary: f/u [mkj]   CC:  follow-up visit.  History of Present Illness: Pt here to get lab results. She has been taking her Atripla every day. She got married 4/4 to a HIV positive man. They have not been using condoms.  Preventive Screening-Counseling & Management  Alcohol-Tobacco     Alcohol drinks/day: 0     Smoking Status: never     Packs/Day: 1     Year Started:   12/09  Caffeine-Diet-Exercise     Caffeine use/day: soda 2 per day     Does Patient Exercise: yes     Type of exercise: walking     Exercise (avg: min/session): <30     Times/week: 7  Hep-HIV-STD-Contraception     HIV Risk: yes  Safety-Violence-Falls     Seat Belt Use: yes      Sexual History:  currently monogamous.        Drug Use:  never.        Blood Transfusions:  no.        Travel History:  NONE.     Updated Prior Medication List: ATRIPLA 600-200-300 MG TABS (EFAVIRENZ-EMTRICITAB-TENOFOVIR) Take 1 tablet by mouth at bedtime  Current Allergies (reviewed today): No known allergies  Past History:  Past Medical History: Last updated: 09/29/2006 HIV disease  Social History: Sexual History:  currently monogamous  Review of Systems  The patient denies anorexia, fever, and weight loss.    Vital Signs:  Patient profile:   35 year old female Menstrual status:  regular Height:      67 inches (170.18 cm) Weight:      276.8 pounds (125.82 kg) BMI:     43.51 Temp:     98.4 degrees F (36.89 degrees C) oral Pulse rate:   81 / minute BP sitting:   129 / 83  (right arm)  Vitals Entered By: Wendall Mola CMA Duncan Dull) (Dec 18, 2009 2:37 PM) CC: follow-up visit Is Patient Diabetic? No Pain Assessment Patient in pain? no      Nutritional Status BMI of > 30 = obese Nutritional Status Detail appetite"fine"  Does patient need assistance? Functional Status Self care Ambulation Normal Comments pt. missed about 2 doses of med. since last visit   Physical Exam  General:  alert,  well-developed, well-nourished, and well-hydrated.   Head:  normocephalic and atraumatic.   Mouth:  pharynx pink and moist.   Lungs:  normal breath sounds.      Impression & Recommendations:  Problem # 1:  HIV DISEASE (ICD-042) Pt.s most recent CD4ct was 420 and VL <48 .  Pt instructed to continue the current antiretroviral regimen.  Pt encouraged to take medication regularly and not miss doses.  Pt will f/u in 3 months for repeat blood work and will see me 2 weeks later.  Discussed the need to use condoms even if both are positive.  Diagnostics Reviewed:  HIV: CDC-defined AIDS (10/04/2009)   HIV-Western blot: POSITIVE (09/17/2006)   CD4: 420 (11/08/2009)   WBC: 6.2 (11/07/2009)   Hgb: 11.2 (11/07/2009)   HCT: 35.5 (11/07/2009)   Platelets: 174 (11/07/2009) HIV-1 RNA: <48 copies/mL (11/07/2009)   HBSAg: NO (10/11/2006)  Other Orders: Est. Patient Level III ([address]) Future Orders: T-CD4SP ([facility]) (CD4SP) ... 03/18/2010 T-HIV Viral Load [phone]) ... 03/18/2010 T-Comprehensive Metabolic Panel [id number]) ... 03/18/2010 T-CBC w/Diff ([id number]) ... 03/18/2010 T-RPR (Syphilis) [phone]) ... 03/18/2010  Patient Instructions: 1)  Please schedule a follow-up appointment in 3 months, 2 weeks after labs.  Prescriptions: ATRIPLA 600-200-300 MG TABS (EFAVIRENZ-EMTRICITAB-TENOFOVIR) Take 1 tablet by mouth at bedtime  #30 x 5   Entered and Authorized by:   Yisroel Ramming MD   Signed by:   Yisroel Ramming MD on 12/18/2009   Method used:   Print then Give to Patient   RxID:   [id number]

## 2010-09-18 NOTE — Assessment & Plan Note (Signed)
Summary: PAP SMEAR   Vital Signs:  Patient profile:   35 year old female Menstrual status:  regular LMP:     01/20/2010  Vitals Entered By: Jennet Maduro RN (January 24, 2010 4:02 PM) CC: PAP smear appt.  Pt given 2 bags of condoms.  Pt. given educational materials re:  HIV and women, partner protection, diet, exercise, nutrition and self-esteem.  Pt. currently on menses.  Informed that PAP might need to be repeated due to menses.  Pt. recently married.  Husband present for this visit.  Does patient need assistance? Functional Status Self care Ambulation Normal LMP (date): 01/20/2010 LMP - Character: normal     Menstrual Status regular Enter LMP: 01/20/2010 Last PAP Result NEGATIVE FOR INTRAEPITHELIAL LESIONS OR MALIGNANCY.   Preventive Screening-Counseling & Management  Caffeine-Diet-Exercise     Caffeine use/day: soda 2 per day     Does Patient Exercise: yes     Type of exercise: walking     Exercise (avg: min/session): <30     Times/week: 7  Hep-HIV-STD-Contraception     HIV Risk: no risk noted     HIV Risk Counseling: to avoid increased HIV risk  Safety-Violence-Falls     Seat Belt Use: yes  Comments: Pt. given 2 bags of condoms.  Using condoms as birth control.      Sexual History:  currently monogamous.    Evaluation and Follow-Up  Prevention For Positives: 01/24/2010   Safe sex practices discussed with patient. Condoms offered. Prior Medications: ATRIPLA 600-200-300 MG TABS (EFAVIRENZ-EMTRICITAB-TENOFOVIR) Take 1 tablet by mouth at bedtime Current Allergies: No known allergies  Orders Added: 1)  Est. Patient Level I [[address]] 2)  T-PAP [facility]) [[address]]        Medication Adherence: 01/24/2010   Adherence to medications reviewed with patient. Counseling to provide adequate adherence provided    Prevention For Positives: 01/24/2010   Safe sex practices discussed with patient. Condoms offered.                             Appended Document:  PAP SMEAR, RYAN WHITE FLOWSHEET UPDATED    Clinical Lists Changes  Observations: Added new observation of PAP SMEAR: NEGATIVE (01/24/2010 15:33) Added new observation of LAST PAP DAT: 01/24/2010 (01/24/2010 15:33)

## 2010-09-18 NOTE — Progress Notes (Signed)
Summary: Fax labs  Phone Note Call from Patient   Caller: Patient Reason for Call: Talk to Nurse Details for Reason: send labs to dentist Summary of Call: pt. called requesting labs be faxed to [facility] at [phone].  She needs to have a tooth pulled.  She is coming to see Angelique Blonder today for pap and I will have her sign a release at that time. Initial call taken by: Wendall Mola CMA Duncan Dull),  January 24, 2010 11:15 AM  Follow-up for Phone Call        ok Follow-up by: Yisroel Ramming MD,  January 24, 2010 3:26 PM     Appended Document: Fax labs pt. called, Dental office was having trouble with fax on Monday 01/27/10.  Labs refaxed

## 2010-09-18 NOTE — Miscellaneous (Signed)
Summary: Orders Update  Clinical Lists Changes  Orders: Added new Test order of T-CBC w/Diff [phone]) - Signed Added new Test order of T-CD4SP [facility]) (CD4SP) - Signed Added new Test order of T-Comprehensive Metabolic Panel [id number]) - Signed Added new Test order of T-HIV Viral Load [id number]) - Signed     Process Orders Check Orders Results:     [facility]: ABN not required for this insurance Tests Sent for requisitioning (May 13, 2010 12:13 PM):     05/13/2010: [facility] -- T-CBC w/Diff [[id number]] (signed)     05/13/2010: [facility] -- T-Comprehensive Metabolic Panel [[id number]] (signed)     05/13/2010: [facility] -- T-HIV Viral Load [phone] (signed)
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
## 2010-09-18 NOTE — Letter (Signed)
Summary: Results Follow-up Letter  [facility]  [address]   [place], [place] [address]   Phone: [phone]  Fax: [phone]           January 31, 2010  [address] [place], [place]  [address]  Dear Ms. Yetta Barre,   The following are the results of your recent test(s):  Test     Result     Pap Smear    Normal_XXX___  Not Normal_____  Comments:  Everything was normal.  I will see you in one year for your next PAP smear.  Thank you for coming to the Center for this important test.  Sincerely,    Jennet Maduro [facility]

## 2010-09-18 NOTE — Miscellaneous (Signed)
Summary: Dr. Dareen Piano  Dr. Dareen Piano   Imported By: Florinda Marker 01/28/2010 16:04:12  _____________________________________________________________________  External Attachment:    Type:   Image     Comment:   External Document

## 2010-09-18 NOTE — Miscellaneous (Signed)
Summary: Orders Update - [facility] referral  Clinical Lists Changes  Orders: Added new Referral order of Misc. Referral (Misc. Ref) - Signed 

## 2010-09-18 NOTE — Progress Notes (Signed)
Summary: needs oral surgeon  Phone Note Call from Patient   Caller: Patient Call For: Nurse Reason for Call: Talk to Nurse Details for Reason: needs oral surgeon Summary of Call: The oral surgeon I had faxed pts. labs to [facility], will not pull pts. tooth because of CD4 count, which is above 400.  I gave her the number for Ocie Doyne [phone].  Pt. will call back if she needs labs faxed to their office. Initial call taken by: Wendall Mola CMA Duncan Dull),  February 03, 2010 3:09 PM

## 2010-11-09 LAB — T-HELPER CELL (CD4) - (RCID CLINIC ONLY): CD4 T Cell Abs: 420 uL (ref 400–2700)

## 2010-11-11 ENCOUNTER — Inpatient Hospital Stay (INDEPENDENT_AMBULATORY_CARE_PROVIDER_SITE_OTHER)
Admission: RE | Admit: 2010-11-11 | Discharge: 2010-11-11 | Disposition: A | Discharge status: Home / Self Care | Payer: Medicaid Other | Source: Ambulatory Visit | Attending: Family Medicine | Admitting: Family Medicine

## 2010-11-11 DIAGNOSIS — N390 Urinary tract infection, site not specified: Secondary | ICD-10-CM

## 2010-11-11 LAB — POCT URINALYSIS DIP (DEVICE)
Nitrite: POSITIVE — AB
Specific Gravity, Urine: >=1.03 (ref 1.005–1.030)
Urobilinogen, UA: 1 mg/dL (ref 0.0–1.0)
pH: 6.5 (ref 5.0–8.0)

## 2010-11-20 LAB — T-HELPER CELL (CD4) - (RCID CLINIC ONLY): CD4 % Helper T Cell: 27 % — ABNORMAL LOW (ref 33–55)

## 2010-11-23 LAB — T-HELPER CELL (CD4) - (RCID CLINIC ONLY)
CD4 % Helper T Cell: 27 % — ABNORMAL LOW (ref 33–55)
CD4 T Cell Abs: 390 uL — ABNORMAL LOW (ref 400–2700)

## 2010-11-24 ENCOUNTER — Other Ambulatory Visit (INDEPENDENT_AMBULATORY_CARE_PROVIDER_SITE_OTHER): Payer: Medicaid Other

## 2010-11-24 ENCOUNTER — Other Ambulatory Visit: Payer: Self-pay | Admitting: Infectious Diseases

## 2010-11-24 DIAGNOSIS — Z79899 Other long term (current) drug therapy: Secondary | ICD-10-CM

## 2010-11-24 DIAGNOSIS — B20 Human immunodeficiency virus [HIV] disease: Secondary | ICD-10-CM

## 2010-11-24 LAB — LIPID PANEL: Total CHOL/HDL Ratio: 2.8 Ratio

## 2010-11-25 LAB — CBC WITH DIFFERENTIAL/PLATELET
Eosinophils Absolute: 0.1 10*3/uL (ref 0.0–0.7)
Eosinophils Relative: 1 % (ref 0–5)
HCT: 38.2 % (ref 36.0–46.0)
Lymphocytes Relative: 41 % (ref 12–46)
Lymphs Abs: 1.8 10*3/uL (ref 0.7–4.0)
MCH: 26.3 pg (ref 26.0–34.0)
MCV: 84.5 fL (ref 78.0–100.0)
Monocytes Absolute: 0.3 10*3/uL (ref 0.1–1.0)
RBC: 4.52 MIL/uL (ref 3.87–5.11)
RDW: 13 % (ref 11.5–15.5)
WBC: 4.4 10*3/uL (ref 4.0–10.5)

## 2010-11-25 LAB — T-HELPER CELL (CD4) - (RCID CLINIC ONLY): CD4 % Helper T Cell: 33 % (ref 33–55)

## 2010-11-25 LAB — HIV-1 RNA QUANT-NO REFLEX-BLD
HIV 1 RNA Quant: <20 copies/mL (ref <20)
HIV-1 RNA Quant, Log: <1.3 {Log} (ref <1.30)

## 2010-11-25 LAB — HEMOGLOBIN A1C
Hgb A1c MFr Bld: 6.5 % — ABNORMAL HIGH (ref <5.7)
Mean Plasma Glucose: 140 mg/dL — ABNORMAL HIGH (ref <117)

## 2010-11-26 ENCOUNTER — Emergency Department (HOSPITAL_COMMUNITY)
Admission: EM | Admit: 2010-11-26 | Discharge: 2010-11-26 | Disposition: A | Discharge status: Home / Self Care | Payer: Medicaid Other | Attending: Emergency Medicine | Admitting: Emergency Medicine

## 2010-11-26 DIAGNOSIS — B20 Human immunodeficiency virus [HIV] disease: Secondary | ICD-10-CM | POA: Insufficient documentation

## 2010-11-26 DIAGNOSIS — R0789 Other chest pain: Secondary | ICD-10-CM | POA: Insufficient documentation

## 2010-11-26 DIAGNOSIS — M546 Pain in thoracic spine: Secondary | ICD-10-CM | POA: Insufficient documentation

## 2010-11-26 DIAGNOSIS — M542 Cervicalgia: Secondary | ICD-10-CM | POA: Insufficient documentation

## 2010-11-26 DIAGNOSIS — M25519 Pain in unspecified shoulder: Secondary | ICD-10-CM | POA: Insufficient documentation

## 2010-11-26 LAB — COMPLETE METABOLIC PANEL WITH GFR
BUN: 11 mg/dL (ref 6–23)
CO2: 22 mEq/L (ref 19–32)
Calcium: 9.7 mg/dL (ref 8.4–10.5)
Chloride: 104 mEq/L (ref 96–112)
Creat: 0.78 mg/dL (ref 0.40–1.20)
GFR, Est African American: >60 mL/min (ref >60)
GFR, Est Non African American: >60 mL/min (ref >60)
Glucose, Bld: 101 mg/dL — ABNORMAL HIGH (ref 70–99)

## 2010-11-27 LAB — T-HELPER CELL (CD4) - (RCID CLINIC ONLY)
CD4 % Helper T Cell: 28 % — ABNORMAL LOW (ref 33–55)
CD4 T Cell Abs: 300 uL — ABNORMAL LOW (ref 400–2700)

## 2010-12-08 ENCOUNTER — Encounter: Payer: Self-pay | Admitting: Adult Health

## 2010-12-08 ENCOUNTER — Ambulatory Visit (INDEPENDENT_AMBULATORY_CARE_PROVIDER_SITE_OTHER): Payer: Medicaid Other | Admitting: Adult Health

## 2010-12-08 ENCOUNTER — Ambulatory Visit: Payer: Self-pay | Admitting: Adult Health

## 2010-12-08 DIAGNOSIS — E119 Type 2 diabetes mellitus without complications: Secondary | ICD-10-CM | POA: Insufficient documentation

## 2010-12-08 DIAGNOSIS — B20 Human immunodeficiency virus [HIV] disease: Secondary | ICD-10-CM

## 2010-12-08 DIAGNOSIS — Z23 Encounter for immunization: Secondary | ICD-10-CM

## 2010-12-08 DIAGNOSIS — E1165 Type 2 diabetes mellitus with hyperglycemia: Secondary | ICD-10-CM | POA: Insufficient documentation

## 2010-12-08 DIAGNOSIS — Z0000 Encounter for general adult medical examination without abnormal findings: Secondary | ICD-10-CM

## 2010-12-08 NOTE — Progress Notes (Signed)
  Subjective:    Patient ID: Beverly Johnson, female    DOB: 1976/04/17, 35 y.o.   MRN: [id number]  HPI presents to clinic for followup. Voices no physical complaints. Adherent to medications with good tolerance.   Review of Systems  Constitutional: Negative.   HENT: Negative.   Eyes: Negative.   Respiratory: Negative.   Cardiovascular: Negative.   Gastrointestinal: Negative.   Genitourinary: Negative.   Musculoskeletal: Negative.   Skin: Negative.   Neurological: Negative.   Hematological: Negative.   Psychiatric/Behavioral: Negative.        Objective:   Physical Exam  Constitutional: She is oriented to person, place, and time. She appears well-developed.       Morbidly obese  HENT:  Head: Normocephalic and atraumatic.  Eyes: Conjunctivae are normal. Pupils are equal, round, and reactive to light.  Neck: Normal range of motion. Neck supple.  Cardiovascular: Normal rate, regular rhythm, normal heart sounds and intact distal pulses.   Pulmonary/Chest: Effort normal and breath sounds normal.  Abdominal: Soft. Bowel sounds are normal.  Musculoskeletal: Normal range of motion.  Neurological: She is alert and oriented to person, place, and time.  Skin: Skin is warm and dry.  Psychiatric: Judgment and thought content normal.       Affect is blunted and her behavior is somewhat distant with short curt answers to questions.          Assessment & Plan:    1. HIV. CD4 from 11/24/2010 was 650 at 33%. Viral load less than 20 copies per mL. Currently stable. Given she has a history of tubal ligation. We recommend she continue present management with repeat labs in 10 weeks and followup in 3 months.   2.Diabetes Mellitus, Type II. Her hemoglobin A1c was 6.5%, which demonstrates a mean daily, glucose of 140 mg/dL. Rather than starting her on medications for her hyperglycemia, we recommend a referral to diabetes educator, for evaluation and teaching, and reevaluate her labs on her next  blood draw. If there are indications that her blood sugars continue to rise in spite of lifestyle modification then we will recommend some form of oral hypoglycemic therapy.  She verbally acknowledged this information and agreed with plan of care.

## 2010-12-30 NOTE — Discharge Summary (Signed)
NAMEKEAIRRA, BARDON                ACCOUNT NO.:  [id number]   MEDICAL RECORD NO.:  [id number]           PATIENT TYPE:   LOCATION:                                 FACILITY:   PHYSICIAN:  Mick Sell, MD DATE OF BIRTH:  14-May-1976   DATE OF ADMISSION:  02/14/2008  DATE OF DISCHARGE:  02/14/2008                               DISCHARGE SUMMARY   DISCHARGE DIAGNOSES:  1. Influenza like illness, empirically being treated for H1N      influenza.  2. Human immunodeficiency virus last known CD4 count 410 in April      2009.  3. Obesity.  4. Depression.  5. Status post bilateral tubal ligation.  6. Status post cesarean section.   DISCHARGE MEDICATIONS:  1. Reyataz 300 mg p.o. daily.  2. Norvir 100 mg p.o. daily.  3. Truvada 200 - 300 mg p.o. daily.  4. Tamiflu 75 mg p.o. b.i.d. x5 days.   CONDITION AT DISCHARGE AND FOLLOWUP:  Stable.  The patient was  instructed to follow up with Dr. Yisroel Ramming in the Infectious Disease  Clinic on March 06, 2008 at 3:30 p.m.  The patient was instructed to try  to stay at home and to stay away from other people until her symptoms  were gone or at least until she had completed all the Tamiflu in order  to prevent transmission.  There is influenza nasal swab pending as well  as an H1N1 serology pending.   PROCEDURES:  Chest x-ray on February 13, 2008, showed a low lung volumes  with bibasilar atelectasis and peribronchial thickening.   CONSULTANTS:  None.   ADMISSION HISTORY AND PHYSICAL:  Please see the chart for full details.   SUMMARY:  Ms. Allison Quarry is a 35 year old female with a past medical history  of HIV who presents with 5 days of upper respiratory infection, symptoms  of runny nose, sore throat, and dry cough.  Her symptoms have gotten  acutely worse in the past 2 days and now include feeling very hot,  sweaty with body aches and pains, headache and chest pain with coughing.  She went to the Urgent Care Center the evening prior to  admission, was  given amoxicillin, Tylenol, and guaifenesin.  She only took the  guaifenesin.  She feels much worse in this evening with increased pain  and cough.  She denies any nausea, vomiting, or diarrhea but states it  hurts to swallow.  Her cough is dry with no hematemesis.  Her chest pain  is central and is increased with cough at rest.  She has no wheezes or  shortness of breath.  She had a fever to 102 degrees on presentation.   Vital signs on admission, temperature initially was 102 degrees  Fahrenheit went down to 98.6, blood pressure 130/82, pulse initially was  107, went down to 96, respiration rate was 22, and she was sating 99% on  room air.  Pertinent physical exam findings include the fact that she  looked uncomfortable.  Her oropharynx was clear.  Neck was supple.  She  did have some tenderness  on the right anterior cervical area but no  lymphadenopathy.  Lungs were clear.  Heart was regular.  Abdomen was  benign.  Her skin was very warm.  She did not have any lymphadenopathy.  The rest of her exam was nonfocal.   LABORATORY DATA:  On admission, urine pregnancy was negative.  UA was  negative.  CBC revealed a white blood cell count of 3.7, hemoglobin  11.8, and platelets 146,000 with an MCV of 82.  Her iSTAT 8 was  revealing of a sodium of 137, potassium 3.7, chloride 104, glucose 106,  BUN 4, and creatinine 0.9, and her strep pneumo urinary antigen was  negative.   HOSPITAL COURSE:  1. Febrile illness with flu-like symptoms.  The patient was      immediately placed on droplet precautions given the pandemic of      H1N1 influenza that has reached [place], [place].  She      was started on Tamiflu empirically and influenza nasal swab and      H1N1 serology are pending.  The patient no longer meets the      criteria for inpatient treatment, therefore was discharged home to      convalesce.  She was instructed to increase her p.o. fluid intake,      to use  Tylenol for pain and use over-the-counter remedies for      symptomatic relief.  She was again instructed to try to stay away      from contact with people as she may have H1N1 and was encouraged to      complete the entire course of Tamiflu.  A repeat CBC with      differential revealed a white blood cell count of 3.5 with an ANC      of 2.5 and her complete metabolic profile revealed a slightly      elevated total bilirubin at 1.5 with slightly elevated AST at 91      and ALT at 65.  Her CD4 helper count was 230, down from 410 in      April 2009, but this could be due to her acute illness.  It is not      clear at this time what the elevated AST and ALT are from, however,      they have been elevated in the past.  This raises the concern that      the elevated liver enzymes could be a medication side effect which      can be followed up in the outpatient setting.  She has had an      abdominal ultrasound in July 2007 that revealed cholelithiasis with      no cholecystitis.   DISCHARGE LABS AND VITAL SIGNS:  On the day of discharge, her  temperature was 98.2, pulse 98, respiration 16, blood pressure 122/83,  and she was sating at 100% on room air.  Again, her CD4 count is 230 in  the setting of an acute illness.  Sodium was 134, potassium 3.9,  chloride 105, bicarb 23, glucose 93, BUN 2, creatinine 0.68, total bili  1.5, alk phos 65, AST 91, ALT 65, total protein 7.2, albumin 3.4, and  calcium 8.9.  White blood cell count 3.5 with an ANC of 2.5, hemoglobin  11.1 with an MCV of 81, and platelets 158.   Pending labs include an H1N1 serologies and influenza A and B nasal  swab.      Chauncey Reading, D.O.  Electronically  Signed      Mick Sell, MD  Electronically Signed    EA/MEDQ  D:  02/14/2008  T:  02/15/2008  Job:  [id number]   cc:   Tresa Endo L. Philipp Deputy, M.D.

## 2011-01-02 NOTE — Discharge Summary (Signed)
NAMEMAURISA, TESMER                          ACCOUNT NO.:  [id number]   MEDICAL RECORD NO.:  [id number]                   PATIENT TYPE:  INP   LOCATION:  9113                                 FACILITY:  [facility]   PHYSICIAN:  James A. Ashley Royalty, M.D.             DATE OF BIRTH:  [birth year]   DATE OF ADMISSION:  04/26/2003  DATE OF DISCHARGE:  05/01/2003                                 DISCHARGE SUMMARY   ADMITTING DIAGNOSES:  1. Intrauterine pregnancy at [redacted] weeks gestation.  2. Hypertension.  Rule out preeclampsia.  3. Previous cesarean section times two.  4. Desire for sterilization.  5. Obesity.  6. Group B strep positive.  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   DISCHARGE DIAGNOSES:  1. Repeat cesarean section with bilateral tubal sterilization.  2. Resolving preeclampsia.  3. Resolving anemia.   HOSPITAL COURSE:  On April 26, 2003, the patient was admitted to [facility] for 23-hour observation secondary to increased blood pressures and  increased SGOT.  The patient is a 35 year old, G3, P2, with an EDC of  May 11, 2003, which puts her at [redacted] weeks gestation.  Prenatal care was  complicated by a GBS positive status, repeat cesarean section x2, HSV, and  obesity.  The patient is also desiring repeat cesarean section and tubal  sterilization procedure.  Upon admission, the patient had borderline  preeclampsia, labs with an SGOT of 43 and SGPT of 23, uric acid of 3.9,  platelets of 187, and hemoglobin of 10.0.  Urine protein was negative.  DTR's were 2+ at this time.  The patient was observed for further signs of  preeclampsia.  On April 27, 2003, the patient was without significant  signs of PIH; however, her blood pressure was 160/84.  DTR's were 1+.  Hemoglobin was 9.7 at this time, with platelets of 205, and an SGOT of 41.  The plan at this time was made for a repeat cesarean section with a  bilateral tubal sterilization.   On April 27, 2003, a  repeat cesarean section was performed, with  delivery of a female infant weighing 7 pounds, 3 ounces, and 19.25 inches  long with Apgars of 8/8/9.  Following the procedure, the patient was taken  to the recovery room in satisfactory condition  where she was initiated on a  PCA pump.   On April 28, 2003, the patient had an eight hour urine output of only  125 cc.  Also, the patient had decreased level of consciousness at this  time, but a PCA pump was ordered to be discharged.  Hemoglobin at this time  was 8.1, platelets were 192, with an SGOT of 54 at this time.  The patient  was then transferred to the intensive care unit where she was placed on  magnesium sulfate.   On April 29, 2003, vital signs were stable.  DTR's were 1 to 2+.  Intake  and output was excellent.  The abdomen was soft.  The incision was clean,  dry, and intact.  Blood pressure at this time was 145/70.  Magnesium sulfate  was discontinued at this time.   On April 30, 2003, blood pressure was 140/84.  The incision was clean,  dry, and intact.  Abdomen was soft.  Vital signs were stable.  Hemoglobin  was decreased to 5.6 at this time.  The patient was transferred to the  postpartum floor with a repeat CBC ordered for the a.m.   On May 01, 2003, the patient was afebrile, lungs were clear, blood  pressure was 160/90.  The patient denied any severe symptoms related to [facility].  CBC was hemoglobin was 6.2, WBC's were 5.9.  The patient was discharged home  at this time.   DISCHARGE DIAGNOSES:  1. Resolving preeclampsia.  2. Resolving anemia.   FOLLOW UP:  Instructions were given to return to the office on May 03, 2003.   DISCHARGE MEDICATIONS:  1. Chromagen forte.  2. Percocet for pain.     Velora Mediate, NP                           Rudy Jew. Ashley Royalty, M.D.    TC/MEDQ  D:  06/26/2003  T:  06/27/2003  Job:  [id number]

## 2011-01-02 NOTE — Op Note (Signed)
NAMEKIMORI, TARTAGLIA                          ACCOUNT NO.:  [id number]   MEDICAL RECORD NO.:  [id number]                   PATIENT TYPE:  INP   LOCATION:  9113                                 FACILITY:  [facility]   PHYSICIAN:  James A. Ashley Royalty, M.D.             DATE OF BIRTH:  04-11-1976   DATE OF PROCEDURE:  04/27/2003  DATE OF DISCHARGE:  05/01/2003                                 OPERATIVE REPORT   PREOPERATIVE DIAGNOSIS:  1. Intrauterine pregnancy at approximately [redacted] weeks gestation.  2. Hypertension - possible pre-eclampsia.  3. Previous cesarean section.  4. Desire for attempt at permanent surgical sterilization.   POSTOPERATIVE DIAGNOSIS:  1. Intrauterine pregnancy at approximately [redacted] weeks gestation.  2. Hypertension - possible pre-eclampsia.  3. Previous cesarean section.  4. Desire for attempt at permanent surgical sterilization.   PROCEDURE:  1. Repeat low transverse cesarean section.  2. Bilateral tubal sterilization procedure (modified [place]).   SURGEON:  Rudy Jew. Ashley Royalty, M.D.   ASSISTANT:  Ronda Fairly. Galen Daft, M.D.   ANESTHESIA:  Spinal.   FINDINGS:  7 pound 3 ounce female, Apgars 8 at 1 minute, 9 at 5 minutes,  sent to the newborn nursery.   ESTIMATED BLOOD LOSS:  600 mL.   COMPLICATIONS:  None.   PACKS AND DRAINS:  None.   PROCEDURE:  The patient was taken to the operating room and placed in the  sitting position.  After spinal anesthesia was administered, she was placed  in the dorsal supine position and prepped and draped in the usual manner for  abdominal surgery.  A Foley catheter was placed.  A Pfannenstiel incision  was made down to the level of the fascia.  The fascia was nicked with the  knife and incised transversely with Mayo scissors.  The underlying rectus  muscles were separated from the overlying fascia using sharp and blunt  dissection.  The rectus muscles were separated in the midline exposing the  peritoneum which was entered  atraumatically with Metzenbaum scissors.  The  incision was extended longitudinally.  The uterus was identified and a  bladder flap created by incising the anterior uterine serosa and sharply and  bluntly dissecting the bladder inferiorly.  It was held in place with a  bladder blade.  The uterus was then entered through a low transverse  incision using sharp and blunt dissection.  The fluid was noted to be clear.  The infant was delivered from a vertex presentation in an atraumatic manner.  The infant was suctioned.  The cord was clamped, cut, and the infant given  immediately to the awaiting pediatric team.  Cord blood was obtained.  The  placenta and membranes were removed in their entirety and submitted to  pathology for histologic studies.  The uterus was then exteriorized.  The  uterus was then closed in two running layers of #1 Vicryl, the first using a  running locking  layer, the second was a running, intermittently locking, and  imbricating layer.  Hemostasis was noted.   Attention was turned to the tubal sterilization procedure.  The left  fallopian tube was grasped in the distal isthmic to proximal ampullary  portion.  A #1 plain catgut suture was tied around a knuckle of the tube in  this region.  A second suture was employed, as well.  The intervening  portion of fallopian tube was incised with Metzenbaum scissors and submitted  to pathology for histologic studies.  Hemostasis was noted.  The right  fallopian tube was grasped and an area in the distal isthmic to proximal  ampullary portion was chosen for ligation.  A #1 plain catgut suture was  placed and tied securely around this knuckle of tube.  A second suture was  tied, as well.  The intervening portion of fallopian tube was excised with  Metzenbaum scissors and submitted to pathology for histologic studies.  Hemostasis was noted.   At this point, the uterus, tubes, and ovaries were found, otherwise, to be  normal and  returned to the abdominal cavity.  Copious irrigation was  accomplished.  The fascia was closed with 0 Vicryl in a running fashion.  The skin was closed with staples.  The patient tolerated the procedure  extremely well.  At the conclusion of the procedure, the urine was clear and  copious.  She was taken to the recovery room in excellent condition.                                               James A. Ashley Royalty, M.D.    JAM/MEDQ  D:  05/03/2003  T:  05/04/2003  Job:  [id number]

## 2011-01-02 NOTE — Op Note (Signed)
[facility]  Patient:    Beverly Johnson, Beverly Johnson Visit Number: [id number] MRN: [id number]          Service Type: Attending:  Elinor Dodge, M.D. Dictated by:   Elinor Dodge, M.D. Proc. Date: 11/09/01                             Operative Report  PREOPERATIVE DIAGNOSES:       1. Previous cesarean section.                               2. Cephalopelvic disproportion.  POSTOPERATIVE DIAGNOSES:      1. Previous cesarean section.                               2. Cephalopelvic disproportion.  PROCEDURE:                    Low flap cesarean section.  SURGEON:                      Elinor Dodge, M.D.  DESCRIPTION OF PROCEDURE:     Under satisfactory spinal anesthesia with the patient in the dorsal supine position and a Foley catheter in the urinary bladder, the abdomen was prepped and draped in the usual sterile manner and entered through a vertical transverse incision along the line of the previous cesarean section, removing the old scar.  On entering the peritoneal cavity, which was done in layers, the visceral peritoneum and the anterior surface of the uterus were opened transversely with sharp dissection.  The bladder was pushed away from the lower uterine segment.  It was entered with sharp and blunt dissection.  From a LOT presentation, the baby was delivered with Simpson forceps after two failed attempts with the vacuum because of the size of the baby.  The baby was then easily delivered, the cord doubly clamped and divided and the baby handed to the pediatrician.  The placenta was spontaneously removed.  The uterus was explored.  The cervix was dilated with ring forceps.  The uterus was closed with continuous running locked 0 Vicryl on an atraumatic needle.  A figure-of-eight was placed in the midportion of the incision using the same suture for one small bleeding area in that area. The area was once again observed for bleeding and none was noted.  The fascia was  closed from either end of the incision, meeting in the midpoint, with continuous running alternating lock 0 Vicryl on an atraumatic needle. Subcutaneous bleeders were controlled with hot cautery.  Skin staples were used for skin edge approximation.  A dry sterile dressing was applied.  The patient tolerated the procedure well with a 600 cc blood loss and the Foley catheter draining clear amber urine at the end of the procedure.  She was transferred to the recovery room in satisfactory condition. Dictated by:   Elinor Dodge, M.D. Attending:  Elinor Dodge, M.D. DD:  11/09/01 TD:  11/10/01 Job: [id number]

## 2011-01-02 NOTE — Discharge Summary (Signed)
[facility]  Patient:    Beverly Johnson, Beverly Johnson Visit Number: [id number] MRN: [id number]          Service Type: MED Location: [facility] Attending Physician:  Enid Cutter Dictated by:   Elinor Dodge, M.D. Admit Date:  11/17/2001 Discharge Date: 11/17/2001                             Discharge Summary  HISTORY OF PRESENT ILLNESS:   The patient is a 35 year old para 1-0-2-1 with a previous C section who came in with spontaneous rupture of membranes for three hours and was admitted in active labor.  She did not want a VBAC.  After discussion, the patient insisted on a repeat cesarean section.  She is GBS positive.  HOSPITAL COURSE:              This was done shortly after she arrived the same day.  The cesarean section went well.  The patient did extremely well postoperatively and had an unremarkable postoperative course and was discharged on Motrin and Percocet for pain, given instructions to return for staple removal one week from the time of the surgery. Dictated by:   Elinor Dodge, M.D. Attending Physician:  Enid Cutter DD:  11/28/01 TD:  11/28/01 Job: [id number]

## 2011-02-24 ENCOUNTER — Other Ambulatory Visit (INDEPENDENT_AMBULATORY_CARE_PROVIDER_SITE_OTHER): Payer: Medicaid Other

## 2011-02-24 DIAGNOSIS — B20 Human immunodeficiency virus [HIV] disease: Secondary | ICD-10-CM

## 2011-02-24 DIAGNOSIS — E119 Type 2 diabetes mellitus without complications: Secondary | ICD-10-CM

## 2011-02-24 DIAGNOSIS — Z79899 Other long term (current) drug therapy: Secondary | ICD-10-CM

## 2011-02-24 LAB — COMPREHENSIVE METABOLIC PANEL
Albumin: 3.9 g/dL (ref 3.5–5.2)
Alkaline Phosphatase: 81 U/L (ref 39–117)
BUN: 8 mg/dL (ref 6–23)
Creat: 0.73 mg/dL (ref 0.50–1.10)
Glucose, Bld: 129 mg/dL — ABNORMAL HIGH (ref 70–99)
Total Bilirubin: 0.4 mg/dL (ref 0.3–1.2)

## 2011-02-24 LAB — LIPID PANEL
Cholesterol: 117 mg/dL (ref 0–200)
HDL: 41 mg/dL (ref >39)
Total CHOL/HDL Ratio: 2.9 Ratio

## 2011-02-25 LAB — CBC WITH DIFFERENTIAL/PLATELET
Basophils Relative: 0 % (ref 0–1)
Eosinophils Absolute: 0 10*3/uL (ref 0.0–0.7)
Eosinophils Relative: 1 % (ref 0–5)
HCT: 37.3 % (ref 36.0–46.0)
Hemoglobin: 11.5 g/dL — ABNORMAL LOW (ref 12.0–15.0)
MCH: 25.8 pg — ABNORMAL LOW (ref 26.0–34.0)
MCHC: 30.8 g/dL (ref 30.0–36.0)
Monocytes Absolute: 0.3 10*3/uL (ref 0.1–1.0)
Monocytes Relative: 7 % (ref 3–12)

## 2011-02-25 LAB — HIV-1 RNA QUANT-NO REFLEX-BLD: HIV-1 RNA Quant, Log: <1.3 {Log} (ref <1.30)

## 2011-02-25 LAB — HEMOGLOBIN A1C: Mean Plasma Glucose: 134 mg/dL — ABNORMAL HIGH (ref <117)

## 2011-03-10 ENCOUNTER — Ambulatory Visit (INDEPENDENT_AMBULATORY_CARE_PROVIDER_SITE_OTHER): Payer: Medicaid Other | Admitting: Adult Health

## 2011-03-10 ENCOUNTER — Encounter: Payer: Self-pay | Admitting: Adult Health

## 2011-03-10 DIAGNOSIS — R7309 Other abnormal glucose: Secondary | ICD-10-CM

## 2011-03-10 DIAGNOSIS — Z79899 Other long term (current) drug therapy: Secondary | ICD-10-CM

## 2011-03-10 DIAGNOSIS — B20 Human immunodeficiency virus [HIV] disease: Secondary | ICD-10-CM

## 2011-03-10 DIAGNOSIS — K8020 Calculus of gallbladder without cholecystitis without obstruction: Secondary | ICD-10-CM | POA: Insufficient documentation

## 2011-03-10 HISTORY — DX: Calculus of gallbladder without cholecystitis without obstruction: K80.20

## 2011-03-10 NOTE — Progress Notes (Signed)
  Subjective:    Patient ID: Beverly Johnson, female    DOB: September 17, 1975, 35 y.o.   MRN: [id number]  HPI Presents to clinic for routine scheduled followup. Relates issues with intermittent adherence to her antiretrovirals, stating that many times, she just forgets to take the medication. Other times, she relates symptoms that include dizziness, and "hot flashes." She states she has her husband available to help her, but she still manages to forget medications quite frequently. She states she does not want to change therapies, but she admits some intolerance to her medicines. She does relate a past history of enlarged. Gallstones and of recent, these have been problematic for her with right upper quadrant pain, and postprandial epigastric pain.   Review of Systems  Constitutional: Negative.   HENT: Negative.   Eyes: Negative.   Respiratory: Negative.   Cardiovascular: Negative.   Gastrointestinal: Positive for abdominal pain.  Genitourinary: Negative.   Musculoskeletal: Negative.   Skin: Negative.   Neurological: Negative.   Hematological: Negative.   Psychiatric/Behavioral: Negative.        Objective:   Physical Exam  Constitutional: She is oriented to person, place, and time. She appears well-developed. No distress.       Obese  HENT:  Head: Normocephalic and atraumatic.  Eyes: Conjunctivae and EOM are normal. Pupils are equal, round, and reactive to light.  Neck: Normal range of motion. Neck supple.  Cardiovascular: Normal rate and regular rhythm.   Pulmonary/Chest: Effort normal and breath sounds normal.  Abdominal: Soft. Bowel sounds are normal.       Some deep palpatory tenderness noted at the epigastrium, and the RUQ.  Musculoskeletal: Normal range of motion.  Neurological: She is alert and oriented to person, place, and time.  Skin: Skin is warm and dry.  Psychiatric: She has a normal mood and affect. Her behavior is normal. Judgment and thought content normal.           Assessment & Plan:  1. HIV. Labs obtained 02/24/2011. Show a CD4 count of 560 at 34% with a viral load of less than 20 copies per mL. She remains clinically stable, even though adherence remains marginal. She does not want to change therapies. Therefore, we strategized by having her incorporate her husband into her care by allowing him to give her the medication every night. She verbally acknowledged this plan and agreed with it. We will followup on next clinic visit, and should there still be problems, we will discuss treatment switch. We will have her come back and repeat labs in 10 weeks and have her followup in 3 months.  2. Cholelithiasis. We'll get an ultrasound of the abdomen first and depending on the findings, subsequent referral to general surgery for evaluation.

## 2011-05-12 LAB — URINALYSIS, ROUTINE W REFLEX MICROSCOPIC
Specific Gravity, Urine: >1.03 — ABNORMAL HIGH
Urobilinogen, UA: 1
pH: 5.5

## 2011-05-12 LAB — URINE MICROSCOPIC-ADD ON

## 2011-05-12 LAB — T-HELPER CELL (CD4) - (RCID CLINIC ONLY): CD4 T Cell Abs: 410

## 2011-05-14 LAB — URINALYSIS, ROUTINE W REFLEX MICROSCOPIC
Bilirubin Urine: NEGATIVE
Ketones, ur: NEGATIVE
Nitrite: NEGATIVE
Urobilinogen, UA: 4 — ABNORMAL HIGH
pH: 8

## 2011-05-14 LAB — COMPREHENSIVE METABOLIC PANEL
BUN: 2 — ABNORMAL LOW
CO2: 23
Chloride: 105
Creatinine, Ser: 0.68
GFR calc non Af Amer: >60
Glucose, Bld: 93
Total Bilirubin: 1.5 — ABNORMAL HIGH

## 2011-05-14 LAB — DIFFERENTIAL
Basophils Absolute: 0
Lymphocytes Relative: 18
Neutro Abs: 2.5

## 2011-05-14 LAB — URINE CULTURE: Colony Count: 70000

## 2011-05-14 LAB — CBC
HCT: 32.6 — ABNORMAL LOW
HCT: 35.2 — ABNORMAL LOW
Hemoglobin: 11.1 — ABNORMAL LOW
MCHC: 33.4
MCV: 81.1
MCV: 81.6
Platelets: 146 — ABNORMAL LOW
RBC: 4.02
RDW: 13.8
WBC: 3.5 — ABNORMAL LOW

## 2011-05-14 LAB — STREP PNEUMONIAE URINARY ANTIGEN: Strep Pneumo Urinary Antigen: NEGATIVE

## 2011-05-14 LAB — CULTURE, BLOOD (ROUTINE X 2)

## 2011-05-14 LAB — T-HELPER CELLS (CD4) COUNT (NOT AT ARMC)
CD4 % Helper T Cell: 35
CD4 T Cell Abs: 230 — ABNORMAL LOW

## 2011-05-14 LAB — POCT I-STAT, CHEM 8
BUN: 4 — ABNORMAL LOW
Calcium, Ion: 1.08 — ABNORMAL LOW
Chloride: 104
Sodium: 137

## 2011-05-14 LAB — HIV-1 RNA QUANT-NO REFLEX-BLD: HIV-1 RNA Quant, Log: <1.7 (ref <1.70)

## 2011-05-19 LAB — T-HELPER CELL (CD4) - (RCID CLINIC ONLY): CD4 T Cell Abs: 420

## 2011-05-22 LAB — GLUCOSE, CAPILLARY: Glucose-Capillary: 181 mg/dL — ABNORMAL HIGH (ref 70–99)

## 2011-05-26 LAB — T-HELPER CELL (CD4) - (RCID CLINIC ONLY)
CD4 % Helper T Cell: 27 — ABNORMAL LOW
CD4 T Cell Abs: 390 — ABNORMAL LOW

## 2011-06-01 ENCOUNTER — Telehealth: Payer: Self-pay | Admitting: *Deleted

## 2011-06-01 LAB — T-HELPER CELL (CD4) - (RCID CLINIC ONLY): CD4 % Helper T Cell: 26 — ABNORMAL LOW

## 2011-06-01 NOTE — Telephone Encounter (Signed)
Patient called asking about her surgical referral. Per NP note the referral would be based on ultrasound results of which patient never got.  Ultrasound referral given to prior authorization coordinator. When it is authorized ultrasound will be scheduled. Wendall Mola CMA

## 2011-06-02 ENCOUNTER — Telehealth: Payer: Self-pay | Admitting: *Deleted

## 2011-06-02 ENCOUNTER — Other Ambulatory Visit: Payer: Self-pay | Admitting: Infectious Diseases

## 2011-06-02 LAB — URINALYSIS, ROUTINE W REFLEX MICROSCOPIC
Bilirubin Urine: NEGATIVE
Glucose, UA: NEGATIVE
Ketones, ur: NEGATIVE
Protein, ur: NEGATIVE

## 2011-06-02 NOTE — Telephone Encounter (Signed)
Called patient to notify her of an appointment for an abdominal ultrasound scheduled for 06/04/11 at 8:15 AM at [facility]. Traci Sermon, NP originally ordered this in July.  Will get another provider to look at the results. Wendall Mola CMA

## 2011-06-04 ENCOUNTER — Ambulatory Visit (HOSPITAL_COMMUNITY)
Admission: RE | Admit: 2011-06-04 | Discharge: 2011-06-04 | Disposition: A | Discharge status: Home / Self Care | Payer: Medicaid Other | Source: Ambulatory Visit | Attending: Adult Health | Admitting: Adult Health

## 2011-06-04 DIAGNOSIS — K8020 Calculus of gallbladder without cholecystitis without obstruction: Secondary | ICD-10-CM | POA: Insufficient documentation

## 2011-06-04 DIAGNOSIS — R109 Unspecified abdominal pain: Secondary | ICD-10-CM | POA: Insufficient documentation

## 2011-06-04 DIAGNOSIS — K7689 Other specified diseases of liver: Secondary | ICD-10-CM | POA: Insufficient documentation

## 2011-06-05 ENCOUNTER — Telehealth: Payer: Self-pay | Admitting: *Deleted

## 2011-06-05 ENCOUNTER — Other Ambulatory Visit: Payer: Self-pay

## 2011-06-05 NOTE — Telephone Encounter (Signed)
Patient notified of appointment with [facility] for 06/16/11 at 11:10 AM with Dr. Carolynne Edouard. Wendall Mola CMA

## 2011-06-16 ENCOUNTER — Ambulatory Visit (INDEPENDENT_AMBULATORY_CARE_PROVIDER_SITE_OTHER): Payer: Medicaid Other | Admitting: General Surgery

## 2011-06-16 ENCOUNTER — Telehealth: Payer: Self-pay | Admitting: *Deleted

## 2011-06-16 ENCOUNTER — Encounter (INDEPENDENT_AMBULATORY_CARE_PROVIDER_SITE_OTHER): Payer: Self-pay | Admitting: General Surgery

## 2011-06-16 DIAGNOSIS — K8020 Calculus of gallbladder without cholecystitis without obstruction: Secondary | ICD-10-CM | POA: Insufficient documentation

## 2011-06-16 NOTE — Telephone Encounter (Signed)
She is in a surgeon's office to discuss her gallstones. She needed to know what meds she took. I told her Atripla & asked about who gave her meds for her other conditions. States she does not have other conditions or meds. States she is not a diabetic & does not have depression. She does not have another md. Told her with medicaid it was a good idea to get a pcp. She refused stating she has no need of one.

## 2011-06-16 NOTE — Patient Instructions (Signed)
Plan for laparoscopic cholecystectomy with cholangiogram

## 2011-06-16 NOTE — Progress Notes (Signed)
Subjective:     Patient ID: Beverly Johnson, female   DOB: 08/23/1975, 35 y.o.   MRN: [id number]  HPI We are asked to see the patient in consultation by Dr. Bradford Farrington to evaluate her for gallstones. The patient is a 35-year-old white female who has been experiencing right upper quadrant pain for several years. She describes the pain as all day every day type of pain. She denies any nausea or vomiting. She underwent an ultrasound which did show stones in her gallbladder but no gallbladder wall thickening or ductal dilatation.  Review of Systems  Constitutional: Negative.   HENT: Negative.   Eyes: Negative.   Respiratory: Negative.   Cardiovascular: Negative.   Gastrointestinal: Positive for abdominal pain.  Genitourinary: Negative.   Musculoskeletal: Negative.   Skin: Negative.   Neurological: Negative.   Hematological: Negative.   Psychiatric/Behavioral: Negative.    Past Medical History  Diagnosis Date  . Gallstones 03/10/2011  . HIV infection    History reviewed. No pertinent past surgical history. Current Outpatient Prescriptions  Medication Sig Dispense Refill  . efavirenz-emtrictabine-tenofovir (ATRIPLA) 600-200-300 MG per tablet Take 1 tablet by mouth at bedtime.         No Known Allergies     Objective:   Physical Exam  Constitutional: She is oriented to person, place, and time. She appears well-developed and well-nourished.  HENT:  Head: Normocephalic and atraumatic.  Eyes: Conjunctivae and EOM are normal. Pupils are equal, round, and reactive to light.  Neck: Normal range of motion. Neck supple.  Cardiovascular: Normal rate, regular rhythm and normal heart sounds.   Pulmonary/Chest: Effort normal and breath sounds normal.  Abdominal: Soft. Bowel sounds are normal. There is tenderness.  Musculoskeletal: Normal range of motion.  Neurological: She is alert and oriented to person, place, and time.  Skin: Skin is warm and dry.  Psychiatric: She has a normal  mood and affect. Her behavior is normal.       Assessment:     The patient has what sounds like symptomatic gallstones. Some of her pain so seemed to be migratory. Because of this I cannot guarantee that removing her gallbladder will take all of her pain away but I do believe it may help her. I have discussed with her in detail the risks and benefits of the operation to remove the gallbladder as well as some of the technical aspects and she understands and wishes to proceed.    Plan:     Plan for laparoscopic cholecystectomy with intraoperative cholangiogram      

## 2011-06-22 ENCOUNTER — Other Ambulatory Visit: Payer: Medicaid Other

## 2011-06-22 ENCOUNTER — Other Ambulatory Visit: Payer: Self-pay

## 2011-06-22 DIAGNOSIS — Z113 Encounter for screening for infections with a predominantly sexual mode of transmission: Secondary | ICD-10-CM

## 2011-06-22 DIAGNOSIS — Z79899 Other long term (current) drug therapy: Secondary | ICD-10-CM

## 2011-06-22 DIAGNOSIS — B20 Human immunodeficiency virus [HIV] disease: Secondary | ICD-10-CM

## 2011-06-23 ENCOUNTER — Other Ambulatory Visit (INDEPENDENT_AMBULATORY_CARE_PROVIDER_SITE_OTHER): Payer: Medicaid Other

## 2011-06-23 ENCOUNTER — Other Ambulatory Visit: Payer: Self-pay | Admitting: *Deleted

## 2011-06-23 ENCOUNTER — Other Ambulatory Visit: Payer: Self-pay | Admitting: Internal Medicine

## 2011-06-23 ENCOUNTER — Encounter (HOSPITAL_COMMUNITY): Payer: Self-pay

## 2011-06-23 DIAGNOSIS — Z113 Encounter for screening for infections with a predominantly sexual mode of transmission: Secondary | ICD-10-CM

## 2011-06-23 DIAGNOSIS — Z79899 Other long term (current) drug therapy: Secondary | ICD-10-CM

## 2011-06-23 DIAGNOSIS — B20 Human immunodeficiency virus [HIV] disease: Secondary | ICD-10-CM

## 2011-06-23 DIAGNOSIS — N390 Urinary tract infection, site not specified: Secondary | ICD-10-CM

## 2011-06-23 LAB — URINALYSIS, ROUTINE W REFLEX MICROSCOPIC
Ketones, ur: NEGATIVE mg/dL
Nitrite: POSITIVE — AB

## 2011-06-23 LAB — URINALYSIS, MICROSCOPIC ONLY: Casts: NONE SEEN

## 2011-06-23 MED ORDER — CIPROFLOXACIN HCL 500 MG PO TABS: 500.0000 mg | ORAL_TABLET | Freq: Two times a day (BID) | ORAL | Status: DC

## 2011-06-24 ENCOUNTER — Telehealth: Payer: Self-pay | Admitting: *Deleted

## 2011-06-24 ENCOUNTER — Ambulatory Visit (HOSPITAL_COMMUNITY): Admission: RE | Admit: 2011-06-24 | Payer: Medicaid Other | Source: Ambulatory Visit

## 2011-06-24 ENCOUNTER — Encounter (HOSPITAL_COMMUNITY): Payer: Self-pay

## 2011-06-24 ENCOUNTER — Other Ambulatory Visit: Payer: Self-pay | Admitting: *Deleted

## 2011-06-24 ENCOUNTER — Encounter (HOSPITAL_COMMUNITY)
Admission: RE | Admit: 2011-06-24 | Discharge: 2011-06-24 | Disposition: A | Discharge status: Home / Self Care | Payer: Medicaid Other | Source: Ambulatory Visit | Attending: General Surgery | Admitting: General Surgery

## 2011-06-24 DIAGNOSIS — N390 Urinary tract infection, site not specified: Secondary | ICD-10-CM

## 2011-06-24 LAB — GC/CHLAMYDIA PROBE AMP, URINE
Chlamydia, Swab/Urine, PCR: NEGATIVE
GC Probe Amp, Urine: NEGATIVE

## 2011-06-24 LAB — CBC WITH DIFFERENTIAL/PLATELET
Basophils Absolute: 0 10*3/uL (ref 0.0–0.1)
Basophils Relative: 0 % (ref 0–1)
Lymphocytes Relative: 41 % (ref 12–46)
MCHC: 31.2 g/dL (ref 30.0–36.0)
Neutro Abs: 1.7 10*3/uL (ref 1.7–7.7)
Neutrophils Relative %: 50 % (ref 43–77)
Platelets: 173 10*3/uL (ref 150–400)
RDW: 13.5 % (ref 11.5–15.5)
WBC: 3.5 10*3/uL — ABNORMAL LOW (ref 4.0–10.5)

## 2011-06-24 LAB — LIPID PANEL
LDL Cholesterol: 62 mg/dL (ref 0–99)
Total CHOL/HDL Ratio: 2.7 Ratio
Triglycerides: 74 mg/dL (ref <150)
VLDL: 15 mg/dL (ref 0–40)

## 2011-06-24 LAB — COMPLETE METABOLIC PANEL WITH GFR
ALT: 15 U/L (ref 0–35)
AST: 17 U/L (ref 0–37)
Albumin: 3.7 g/dL (ref 3.5–5.2)
Alkaline Phosphatase: 103 U/L (ref 39–117)
Calcium: 8.6 mg/dL (ref 8.4–10.5)
Chloride: 106 mEq/L (ref 96–112)
Creat: 0.68 mg/dL (ref 0.50–1.10)
Potassium: 4.3 mEq/L (ref 3.5–5.3)

## 2011-06-24 LAB — T-HELPER CELL (CD4) - (RCID CLINIC ONLY): CD4 T Cell Abs: 570 uL (ref 400–2700)

## 2011-06-24 LAB — SURGICAL PCR SCREEN
MRSA, PCR: NEGATIVE
Staphylococcus aureus: POSITIVE — AB

## 2011-06-24 LAB — HCG, SERUM, QUALITATIVE: Preg, Serum: NEGATIVE

## 2011-06-24 MED ORDER — CIPROFLOXACIN HCL 500 MG PO TABS: 500.0000 mg | ORAL_TABLET | Freq: Two times a day (BID) | ORAL | Status: DC

## 2011-06-24 NOTE — Pre-Procedure Instructions (Signed)
22 Beverly Johnson  06/24/2011   Your procedure is scheduled on: November 12  Report to [facility] at 5:30 AM.  Call this number if you have problems the morning of surgery: [phone]   Remember:   Do not eat food:After Midnight.  Do not drink clear liquids: 4 Hours before arrival.  Take these medicines the morning of surgery with A SIP OF WATER: Cipro, Atripla   Do not wear jewelry, make-up or nail polish.  Do not wear lotions, powders, or perfumes. You may wear deodorant.  Do not shave 48 hours prior to surgery.  Do not bring valuables to the hospital.  Contacts, dentures or bridgework may not be worn into surgery.  Leave suitcase in the car. After surgery it may be brought to your room.  For patients admitted to the hospital, checkout time is 11:00 AM the day of discharge.   Patients discharged the day of surgery will not be allowed to drive home.  Name and phone number of your driver: NA  Special Instructions: CHG Shower Use Special Wash: 1/2 bottle night before surgery and 1/2 bottle morning of surgery.   Please read over the following fact sheets that you were given: Pain Booklet, Coughing and Deep Breathing and Surgical Site Infection Prevention

## 2011-06-24 NOTE — Telephone Encounter (Signed)
Patient called and advised that she does not have the $3 copay medicaid requires to pick up her meds. Asked what she is to do if she can not pick up her meds. Asked her if she has anyone she can borrow the copay from and she said she will call her mother.

## 2011-06-25 LAB — HIV-1 RNA QUANT-NO REFLEX-BLD: HIV 1 RNA Quant: NOT DETECTED copies/mL (ref <20)

## 2011-06-28 MED ORDER — CEFAZOLIN SODIUM-DEXTROSE 2-3 GM-% IV SOLR
2.0000 g | INTRAVENOUS | Status: AC
  Administered 2011-06-29: 2 g via INTRAVENOUS
  Filled 2011-06-28: qty 50

## 2011-06-29 ENCOUNTER — Encounter (HOSPITAL_COMMUNITY): Payer: Self-pay | Admitting: Certified Registered Nurse Anesthetist

## 2011-06-29 ENCOUNTER — Inpatient Hospital Stay (HOSPITAL_COMMUNITY)
Admission: RE | Admit: 2011-06-29 | Discharge: 2011-06-30 | DRG: 419 | Disposition: A | Discharge status: Home / Self Care | Payer: Medicaid Other | Source: Ambulatory Visit | Attending: General Surgery | Admitting: General Surgery

## 2011-06-29 ENCOUNTER — Encounter (HOSPITAL_COMMUNITY)
Admission: RE | Disposition: A | Discharge status: Home / Self Care | Payer: Self-pay | Source: Ambulatory Visit | Attending: General Surgery

## 2011-06-29 ENCOUNTER — Other Ambulatory Visit (INDEPENDENT_AMBULATORY_CARE_PROVIDER_SITE_OTHER): Payer: Self-pay | Admitting: General Surgery

## 2011-06-29 ENCOUNTER — Ambulatory Visit (HOSPITAL_COMMUNITY): Payer: Medicaid Other | Admitting: Certified Registered Nurse Anesthetist

## 2011-06-29 ENCOUNTER — Ambulatory Visit (HOSPITAL_COMMUNITY): Payer: Medicaid Other

## 2011-06-29 ENCOUNTER — Encounter (HOSPITAL_COMMUNITY): Payer: Self-pay | Admitting: *Deleted

## 2011-06-29 DIAGNOSIS — Z01812 Encounter for preprocedural laboratory examination: Secondary | ICD-10-CM

## 2011-06-29 DIAGNOSIS — K8010 Calculus of gallbladder with chronic cholecystitis without obstruction: Secondary | ICD-10-CM

## 2011-06-29 DIAGNOSIS — N390 Urinary tract infection, site not specified: Secondary | ICD-10-CM

## 2011-06-29 DIAGNOSIS — K8020 Calculus of gallbladder without cholecystitis without obstruction: Principal | ICD-10-CM | POA: Diagnosis present

## 2011-06-29 HISTORY — PX: CHOLECYSTECTOMY: SHX55

## 2011-06-29 LAB — GLUCOSE, CAPILLARY: Glucose-Capillary: 141 mg/dL — ABNORMAL HIGH (ref 70–99)

## 2011-06-29 SURGERY — LAPAROSCOPIC CHOLECYSTECTOMY WITH INTRAOPERATIVE CHOLANGIOGRAM
Anesthesia: General | Site: Abdomen | Wound class: Contaminated

## 2011-06-29 MED ORDER — LIDOCAINE HCL 4 % IJ SOLN
INTRAMUSCULAR | Status: DC | PRN
  Administered 2011-06-29: 5 mL

## 2011-06-29 MED ORDER — CIPROFLOXACIN HCL 500 MG PO TABS
500.0000 mg | ORAL_TABLET | Freq: Two times a day (BID) | ORAL | Status: DC
  Administered 2011-06-29 – 2011-06-30 (×3): 500 mg via ORAL
  Filled 2011-06-29 (×5): qty 1

## 2011-06-29 MED ORDER — HYDROCODONE-ACETAMINOPHEN 5-325 MG PO TABS
1.0000 | ORAL_TABLET | ORAL | Status: DC | PRN
  Administered 2011-06-29: 2 via ORAL
  Administered 2011-06-30: 1 via ORAL
  Administered 2011-06-30: 2 via ORAL
  Filled 2011-06-29: qty 1
  Filled 2011-06-29 (×3): qty 2

## 2011-06-29 MED ORDER — ONDANSETRON HCL 4 MG/2ML IJ SOLN
4.0000 mg | Freq: Four times a day (QID) | INTRAMUSCULAR | Status: DC | PRN
  Administered 2011-06-29: 4 mg via INTRAVENOUS
  Filled 2011-06-29: qty 2

## 2011-06-29 MED ORDER — ONDANSETRON HCL 4 MG/2ML IJ SOLN
INTRAMUSCULAR | Status: DC | PRN
  Administered 2011-06-29: 4 mg via INTRAVENOUS

## 2011-06-29 MED ORDER — SODIUM CHLORIDE 0.9 % IR SOLN
Status: DC | PRN
  Administered 2011-06-29: 500 mL

## 2011-06-29 MED ORDER — PROMETHAZINE HCL 25 MG/ML IJ SOLN: 6.2500 mg | INTRAMUSCULAR | Status: DC | PRN

## 2011-06-29 MED ORDER — IOHEXOL 300 MG/ML  SOLN
INTRAMUSCULAR | Status: DC | PRN
  Administered 2011-06-29: 6 mL

## 2011-06-29 MED ORDER — GLYCOPYRROLATE 0.2 MG/ML IJ SOLN
INTRAMUSCULAR | Status: DC | PRN
  Administered 2011-06-29: .7 mg via INTRAVENOUS

## 2011-06-29 MED ORDER — LIDOCAINE HCL (CARDIAC) 20 MG/ML IV SOLN
INTRAVENOUS | Status: DC | PRN
  Administered 2011-06-29: 100 mg via INTRAVENOUS

## 2011-06-29 MED ORDER — ONDANSETRON HCL 4 MG PO TABS: 4.0000 mg | ORAL_TABLET | Freq: Four times a day (QID) | ORAL | Status: DC | PRN

## 2011-06-29 MED ORDER — MIDAZOLAM HCL 5 MG/5ML IJ SOLN
INTRAMUSCULAR | Status: DC | PRN
  Administered 2011-06-29: 2 mg via INTRAVENOUS

## 2011-06-29 MED ORDER — FENTANYL CITRATE 0.05 MG/ML IJ SOLN
INTRAMUSCULAR | Status: DC | PRN
  Administered 2011-06-29: 100 ug via INTRAVENOUS
  Administered 2011-06-29: 50 ug via INTRAVENOUS
  Administered 2011-06-29: 100 ug via INTRAVENOUS

## 2011-06-29 MED ORDER — LACTATED RINGERS IV SOLN: INTRAVENOUS | Status: DC

## 2011-06-29 MED ORDER — MORPHINE SULFATE 2 MG/ML IJ SOLN
2.0000 mg | INTRAMUSCULAR | Status: DC | PRN
  Administered 2011-06-29 (×3): 2 mg via INTRAVENOUS
  Filled 2011-06-29 (×3): qty 1

## 2011-06-29 MED ORDER — NEOSTIGMINE METHYLSULFATE 1 MG/ML IJ SOLN
INTRAMUSCULAR | Status: DC | PRN
  Administered 2011-06-29: 4 mg via INTRAVENOUS

## 2011-06-29 MED ORDER — WHITE PETROLATUM GEL
Status: AC
  Administered 2011-06-29: 13:00:00
  Filled 2011-06-29: qty 5

## 2011-06-29 MED ORDER — EFAVIRENZ-EMTRICITAB-TENOFOVIR 600-200-300 MG PO TABS
1.0000 | ORAL_TABLET | Freq: Every day | ORAL | Status: DC
Start: 2011-06-29 — End: 2011-06-30
  Administered 2011-06-29: 1 via ORAL
  Filled 2011-06-29 (×2): qty 1

## 2011-06-29 MED ORDER — ROCURONIUM BROMIDE 100 MG/10ML IV SOLN
INTRAVENOUS | Status: DC | PRN
  Administered 2011-06-29: 10 mg via INTRAVENOUS
  Administered 2011-06-29: 50 mg via INTRAVENOUS

## 2011-06-29 MED ORDER — KCL IN DEXTROSE-NACL 20-5-0.9 MEQ/L-%-% IV SOLN
INTRAVENOUS | Status: DC
  Administered 2011-06-29 – 2011-06-30 (×2): via INTRAVENOUS
  Filled 2011-06-29 (×2): qty 1000

## 2011-06-29 MED ORDER — LACTATED RINGERS IV SOLN
INTRAVENOUS | Status: DC | PRN
  Administered 2011-06-29: 08:00:00 via INTRAVENOUS

## 2011-06-29 MED ORDER — BUPIVACAINE-EPINEPHRINE 0.25% -1:200000 IJ SOLN
INTRAMUSCULAR | Status: DC | PRN
  Administered 2011-06-29: 22 mL

## 2011-06-29 MED ORDER — MEPERIDINE HCL 25 MG/ML IJ SOLN: 6.2500 mg | INTRAMUSCULAR | Status: DC | PRN

## 2011-06-29 MED ORDER — PROPOFOL 10 MG/ML IV EMUL
INTRAVENOUS | Status: DC | PRN
  Administered 2011-06-29: 200 mg via INTRAVENOUS

## 2011-06-29 MED ORDER — HYDROMORPHONE HCL PF 1 MG/ML IJ SOLN
0.2500 mg | INTRAMUSCULAR | Status: DC | PRN
  Administered 2011-06-29 (×2): 0.5 mg via INTRAVENOUS

## 2011-06-29 SURGICAL SUPPLY — 44 items
ADH SKN CLS APL DERMABOND .7 (GAUZE/BANDAGES/DRESSINGS) ×1
APPLIER CLIP ROT 10 11.4 M/L (STAPLE) ×2
APR CLP MED LRG 11.4X10 (STAPLE) ×1
BAG SPEC RTRVL LRG 6X4 10 (ENDOMECHANICALS) ×1
BLADE SURG ROTATE 9660 (MISCELLANEOUS) IMPLANT
CANISTER SUCTION 2500CC (MISCELLANEOUS) ×2 IMPLANT
CATH REDDICK CHOLANGI 4FR 50CM (CATHETERS) ×2 IMPLANT
CHLORAPREP W/TINT 26ML (MISCELLANEOUS) ×2 IMPLANT
CLIP APPLIE ROT 10 11.4 M/L (STAPLE) ×1 IMPLANT
CLOTH BEACON ORANGE TIMEOUT ST (SAFETY) ×2 IMPLANT
COVER MAYO STAND STRL (DRAPES) ×2 IMPLANT
COVER SURGICAL LIGHT HANDLE (MISCELLANEOUS) ×2 IMPLANT
DECANTER SPIKE VIAL GLASS SM (MISCELLANEOUS) ×4 IMPLANT
DERMABOND ADVANCED (GAUZE/BANDAGES/DRESSINGS) ×1
DERMABOND ADVANCED .7 DNX12 (GAUZE/BANDAGES/DRESSINGS) ×1 IMPLANT
DRAPE C-ARM 42X72 X-RAY (DRAPES) ×2 IMPLANT
DRAPE UTILITY 15X26 W/TAPE STR (DRAPE) ×4 IMPLANT
ELECT REM PT RETURN 9FT ADLT (ELECTROSURGICAL) ×2
ELECTRODE REM PT RTRN 9FT ADLT (ELECTROSURGICAL) ×1 IMPLANT
GLOVE BIO SURGEON STRL SZ7.5 (GLOVE) ×3 IMPLANT
GLOVE BIOGEL PI IND STRL 7.0 (GLOVE) IMPLANT
GLOVE BIOGEL PI IND STRL 7.5 (GLOVE) IMPLANT
GLOVE BIOGEL PI INDICATOR 7.0 (GLOVE) ×1
GLOVE BIOGEL PI INDICATOR 7.5 (GLOVE) ×2
GLOVE SURG SS PI 6.5 STRL IVOR (GLOVE) ×2 IMPLANT
GOWN STRL NON-REIN LRG LVL3 (GOWN DISPOSABLE) ×9 IMPLANT
IV CATH 14GX2 1/4 (CATHETERS) ×2 IMPLANT
KIT BASIN OR (CUSTOM PROCEDURE TRAY) ×2 IMPLANT
KIT ROOM TURNOVER OR (KITS) ×2 IMPLANT
NS IRRIG 1000ML POUR BTL (IV SOLUTION) ×2 IMPLANT
PAD ARMBOARD 7.5X6 YLW CONV (MISCELLANEOUS) ×2 IMPLANT
POUCH SPECIMEN RETRIEVAL 10MM (ENDOMECHANICALS) ×2 IMPLANT
SCISSORS LAP 5X35 DISP (ENDOMECHANICALS) IMPLANT
SET IRRIG TUBING LAPAROSCOPIC (IRRIGATION / IRRIGATOR) ×2 IMPLANT
SLEEVE ENDOPATH XCEL 5M (ENDOMECHANICALS) ×2 IMPLANT
SPECIMEN JAR SMALL (MISCELLANEOUS) ×2 IMPLANT
SUT MNCRL AB 4-0 PS2 18 (SUTURE) ×2 IMPLANT
TOWEL OR 17X24 6PK STRL BLUE (TOWEL DISPOSABLE) ×2 IMPLANT
TOWEL OR 17X26 10 PK STRL BLUE (TOWEL DISPOSABLE) ×2 IMPLANT
TRAY LAPAROSCOPIC (CUSTOM PROCEDURE TRAY) ×2 IMPLANT
TROCAR XCEL BLUNT TIP 100MML (ENDOMECHANICALS) ×2 IMPLANT
TROCAR XCEL NON-BLD 11X100MML (ENDOMECHANICALS) ×2 IMPLANT
TROCAR XCEL NON-BLD 5MMX100MML (ENDOMECHANICALS) ×2 IMPLANT
WATER STERILE IRR 1000ML POUR (IV SOLUTION) IMPLANT

## 2011-06-29 NOTE — Anesthesia Postprocedure Evaluation (Signed)
  Anesthesia Post-op Note  Patient: Beverly Johnson  Procedure(s) Performed:  LAPAROSCOPIC CHOLECYSTECTOMY WITH INTRAOPERATIVE CHOLANGIOGRAM  Patient Location: PACU  Anesthesia Type: General  Level of Consciousness: sedated  Airway and Oxygen Therapy: Patient Spontanous Breathing  Post-op Pain: none  Post-op Assessment: Patient's Cardiovascular Status Stable,  and Respiratory Function Stable  Post-op Vital Signs: stable  Complications: No apparent anesthesia complications

## 2011-06-29 NOTE — H&P (View-Only) (Signed)
Subjective:     Patient ID: Beverly Johnson, female   DOB: 12-26-1975, 35 y.o.   MRN: [id number]  HPI We are asked to see the patient in consultation by Dr. Talmadge Chad to evaluate her for gallstones. The patient is a 35 year old white female who has been experiencing right upper quadrant pain for several years. She describes the pain as all day every day type of pain. She denies any nausea or vomiting. She underwent an ultrasound which did show stones in her gallbladder but no gallbladder wall thickening or ductal dilatation.  Review of Systems  Constitutional: Negative.   HENT: Negative.   Eyes: Negative.   Respiratory: Negative.   Cardiovascular: Negative.   Gastrointestinal: Positive for abdominal pain.  Genitourinary: Negative.   Musculoskeletal: Negative.   Skin: Negative.   Neurological: Negative.   Hematological: Negative.   Psychiatric/Behavioral: Negative.    Past Medical History  Diagnosis Date  . Gallstones 03/10/2011  . HIV infection    History reviewed. No pertinent past surgical history. Current Outpatient Prescriptions  Medication Sig Dispense Refill  . efavirenz-emtrictabine-tenofovir (ATRIPLA) 600-200-300 MG per tablet Take 1 tablet by mouth at bedtime.         No Known Allergies     Objective:   Physical Exam  Constitutional: She is oriented to person, place, and time. She appears well-developed and well-nourished.  HENT:  Head: Normocephalic and atraumatic.  Eyes: Conjunctivae and EOM are normal. Pupils are equal, round, and reactive to light.  Neck: Normal range of motion. Neck supple.  Cardiovascular: Normal rate, regular rhythm and normal heart sounds.   Pulmonary/Chest: Effort normal and breath sounds normal.  Abdominal: Soft. Bowel sounds are normal. There is tenderness.  Musculoskeletal: Normal range of motion.  Neurological: She is alert and oriented to person, place, and time.  Skin: Skin is warm and dry.  Psychiatric: She has a normal  mood and affect. Her behavior is normal.       Assessment:     The patient has what sounds like symptomatic gallstones. Some of her pain so seemed to be migratory. Because of this I cannot guarantee that removing her gallbladder will take all of her pain away but I do believe it may help her. I have discussed with her in detail the risks and benefits of the operation to remove the gallbladder as well as some of the technical aspects and she understands and wishes to proceed.    Plan:     Plan for laparoscopic cholecystectomy with intraoperative cholangiogram

## 2011-06-29 NOTE — Progress Notes (Signed)
Via volunteer, Ree Kida, pt's family notified that pt going to [phone].

## 2011-06-29 NOTE — Interval H&P Note (Signed)
History and Physical Interval Note:   06/29/2011   7:19 AM   Beverly Johnson  has presented today for surgery, with the diagnosis of gallstones  The various methods of treatment have been discussed with the patient and family. After consideration of risks, benefits and other options for treatment, the patient has consented to  Procedure(s): LAPAROSCOPIC CHOLECYSTECTOMY WITH INTRAOPERATIVE CHOLANGIOGRAM as a surgical intervention .  The patients' history has been reviewed, patient examined, no change in status, stable for surgery.  I have reviewed the patients' chart and labs.  Questions were answered to the patient's satisfaction.     Robyne Askew  MD

## 2011-06-29 NOTE — Anesthesia Preprocedure Evaluation (Addendum)
Anesthesia Evaluation  Patient identified by MRN, date of birth, ID band Patient awake    Reviewed: Allergy & Precautions, H&P , NPO status , Patient's Chart, lab work & pertinent test results, reviewed documented beta blocker date and time   Airway Mallampati: II TM Distance: >3 FB Neck ROM: Full    Dental  (+) Teeth Intact and Dental Advisory Given   Pulmonary neg pulmonary ROS,  clear to auscultation  Pulmonary exam normal       Cardiovascular neg cardio ROS Regular Normal    Neuro/Psych PSYCHIATRIC DISORDERS Depression Negative Neurological ROS     GI/Hepatic Neg liver ROS, Gallstones   Endo/Other  Diabetes mellitus-Patient does not take medications for DM well controlled with diet alone  Renal/GU negative Renal ROS     Musculoskeletal negative musculoskeletal ROS (+)   Abdominal (+) obese,   Peds  Hematology  (+) HIV, Lab work reviewed for HIV, patient well controlled on medication   Anesthesia Other Findings   Reproductive/Obstetrics negative OB ROS                           Anesthesia Physical Anesthesia Plan  ASA: II  Anesthesia Plan: General   Post-op Pain Management:    Induction: Intravenous  Airway Management Planned: Oral ETT  Additional Equipment:   Intra-op Plan:   Post-operative Plan:   Informed Consent: I have reviewed the patients History and Physical, chart, labs and discussed the procedure including the risks, benefits and alternatives for the proposed anesthesia with the patient or authorized representative who has indicated his/her understanding and acceptance.   Dental advisory given  Plan Discussed with: CRNA and Surgeon  Anesthesia Plan Comments:         Anesthesia Quick Evaluation

## 2011-06-29 NOTE — Op Note (Signed)
Date 06/29/2011 Medical record #[id number] Name Saul Dorsi Preoperative diagnosis: Gallstones Postoperative diagnosis: Gallstones Anesthesia: Gen. Endotracheal Estimated blood loss: Minimal Complications: None  Procedure: After informed consent was obtained the patient was brought to the operating room and placed in the supine position on the operating room table. After adequate induction of general anesthesia the patient's abdomen was prepped with ChloraPrep allowed to dry and draped in usual sterile manner. The area below the umbilicus was infiltrated with quarter percent  Marcaine. A small incision was made with a 15 blade knife. The incision was carried down through the subcutaneous tissue bluntly with a hemostat and [employer] retractors. The linea alba was identified. The linea alba was incised with a 15 blade knife and each side was grasped with Coker clamps. The preperitoneal space was then probed with a hemostat until the peritoneum was opened and access was gained to the abdominal cavity. A 0 Vicryl pursestring stitch was placed in the fascia surrounding the opening. A Hassan cannula was then placed through the opening and anchored in place with the previously placed Vicryl purse string stitch. The abdomen was insufflated with carbon dioxide without difficulty. A laparoscope was inserted through the [facility] cannula in the right upper quadrant was inspected. Next the epigastric region was infiltrated with % Marcaine. A small incision was made with a 15 blade knife. A 10 mm port was placed bluntly through this incision into the abdominal cavity under direct vision. Next 2 sites were chosen laterally on the right side of the abdomen for placement of 5 mm ports. Each of these areas was infiltrated with quarter percent Marcaine. Small stab incisions were made with a 15 blade knife. 5 mm ports were then placed bluntly through these incisions into the abdominal cavity under direct vision without  difficulty. A blunt grasper was placed through the lateralmost 5 mm port and used to grasp the dome of the gallbladder and elevated anteriorly and superiorly. Another blunt grasper was placed through the other 5 mm port and used to retract the body and neck of the gallbladder. A dissector was placed through the epigastric port and using the electrocautery the peritoneal reflection at the gallbladder neck was opened. Blunt dissection was then carried out in this area until the gallbladder neck-cystic duct junction was readily identified and a good window was created. A single clip was placed on the gallbladder neck. A small  ductotomy was made just below the clip with laparoscopic scissors. A 14-gauge Angiocath was then placed through the anterior abdominal wall under direct vision. A Reddick cholangiogram catheter was then placed through the Angiocath and flushed. The catheter was then placed in the cystic duct and anchored in place with a clip. A cholangiogram was obtained that showed no filling defects good emptying into the duodenum an adequate length on the cystic duct. The anchoring clip and catheters were then removed from the patient. 3 clips were placed proximally on the cystic duct and the duct was divided between the 2 sets of clips. Posterior to this the cystic artery was identified and again dissected bluntly in a circumferential manner until a good window  was created. 2 clips were placed proximally and one distally on the artery and the artery was divided between the 2 sets of clips. Next a laparoscopic hook cautery device was used to separate the gallbladder from the liver bed. Prior to completely detaching the gallbladder from the liver bed the liver bed was inspected and several small bleeding points were coagulated with the  electrocautery until the area was completely hemostatic. The gallbladder was then detached the rest of it from the liver bed without difficulty. A laparoscopic bag was inserted  through the epigastric port. The gallbladder was placed within the bag and the bag was sealed. A laparoscope was then moved to the epigastric port. The gallbladder grasper was placed through the [facility] cannula and used to grasp the opening of the bag. The bag with the gallbladder was then removed with the [facility] cannula through the infraumbilical port without difficulty. The fascial defect was then closed with the previously placed Vicryl pursestring stitch as well as with another figure-of-eight 0 Vicryl stitch. The liver bed was inspected again and found to be hemostatic. The abdomen was irrigated with copious amounts of saline until the effluent was clear. The ports were then removed under direct vision without difficulty and were found to be hemostatic. The gas was allowed to escape. The skin incisions were all closed with interrupted 4-0 Monocryl subcuticular stitches. Dermabond dressings were applied. The patient tolerated the procedure well. At the end of the case all needle sponge and instrument counts were correct. The patient was then awakened and taken to recovery in stable condition

## 2011-06-29 NOTE — Preoperative (Signed)
Beta Blockers   Reason not to administer Beta Blockers:Not Applicable 

## 2011-06-29 NOTE — Transfer of Care (Signed)
Immediate Anesthesia Transfer of Care Note  Patient: Beverly Johnson  Procedure(s) Performed:  LAPAROSCOPIC CHOLECYSTECTOMY WITH INTRAOPERATIVE CHOLANGIOGRAM  Patient Location: PACU  Anesthesia Type: General  Level of Consciousness: awake, alert  and oriented  Airway & Oxygen Therapy: Patient Spontanous Breathing and Patient connected to nasal cannula oxygen  Post-op Assessment: Report given to PACU RN and Post -op Vital signs reviewed and stable  Post vital signs: Reviewed and stable  Complications: No apparent anesthesia complications

## 2011-06-29 NOTE — Anesthesia Procedure Notes (Signed)
Procedure Name: Intubation Date/Time: 06/29/2011 7:57 AM Performed by: Delbert Harness Pre-anesthesia Checklist: Patient identified, Emergency Drugs available, Suction available and Patient being monitored Patient Re-evaluated:Patient Re-evaluated prior to inductionOxygen Delivery Method: Circle System Utilized Preoxygenation: Pre-oxygenation with 100% oxygen Intubation Type: IV induction Ventilation: Oral airway inserted - appropriate to patient size and Mask ventilation without difficulty Laryngoscope Size: Mac and 3 Grade View: Grade II Tube type: Oral Tube size: 7.0 mm Number of attempts: 1 Airway Equipment and Method: stylet Placement Confirmation: ETT inserted through vocal cords under direct vision,  breath sounds checked- equal and bilateral and positive ETCO2 Secured at: 22 cm Tube secured with: Tape Dental Injury: Teeth and Oropharynx as per pre-operative assessment

## 2011-06-30 MED ORDER — HYDROCODONE-ACETAMINOPHEN 5-325 MG PO TABS: 1.0000 | ORAL_TABLET | ORAL | Status: AC | PRN

## 2011-06-30 NOTE — Progress Notes (Signed)
  1 Day Post-Op  Subjective: The patient complains of soreness. She is tolerating her diet.  Objective: Vital signs in last 24 hours: Temp:  [98.2 F (36.8 C)-98.8 F (37.1 C)] 98.2 F (36.8 C) (11/13 1036) Pulse Rate:  [68-95] 68  (11/13 1036) Resp:  [18-19] 18  (11/13 1036) BP: (98-138)/(58-84) 138/84 mmHg (11/13 1036) SpO2:  [100 %] 100 % (11/13 1036) Last BM Date: 06/28/11  Intake/Output from previous day: 11/12 0701 - 11/13 0700 In: 2572.2 [P.O.:840; I.V.:1732.2] Out: 2915 [Urine:2900; Blood:15] Intake/Output this shift: Total I/O In: 120 [P.O.:120] Out: 300 [Urine:300]  on exam the abdomen is soft with mild tenderness. Her incisions are healing nicely. There is no sign of infection.  Lab Results:  No results found for this basename: WBC:2,HGB:2,HCT:2,PLT:2 in the last 72 hours BMET No results found for this basename: NA:2,K:2,CL:2,CO2:2,GLUCOSE:2,BUN:2,CREATININE:2,CALCIUM:2 in the last 72 hours PT/INR No results found for this basename: LABPROT:2,INR:2 in the last 72 hours ABG No results found for this basename: PHART:2,PCO2:2,PO2:2,HCO3:2 in the last 72 hours  Studies/Results: Dg Cholangiogram Operative Or 16  06/29/2011  *RADIOLOGY REPORT*  Clinical Data:   Cholelithiasis, cholecystectomy  INTRAOPERATIVE CHOLANGIOGRAM  Technique:  Cholangiographic images from the C-arm fluoroscopic device were submitted for interpretation post-operatively.  Please see the procedural report for the amount of contrast and the fluoroscopy time utilized.  Comparison:  06/04/2011  Findings:  Biliary confluence, common hepatic duct, common bile duct and cystic duct remnant are all patent.  Contrast opacifies the duodenum.  Negative for biliary obstruction, filling defect, dilatation, or retained stone fragment.  IMPRESSION: Patent biliary system  Original Report Authenticated By: Judie Petit. Ruel Favors, M.D.    Anti-infectives: Anti-infectives     Start     Dose/Rate Route Frequency Ordered  Stop   06/29/11 2200  efavirenz-emtrictabine-tenofovir (ATRIPLA) 600-200-300 MG per tablet 1 tablet       1 tablet Oral Daily at bedtime 06/29/11 1143     06/29/11 1145   ciprofloxacin (CIPRO) tablet 500 mg        500 mg Oral 2 times daily 06/29/11 1143     06/28/11 1330   ceFAZolin (ANCEF) IVPB 2 g/50 mL premix        2 g 100 mL/hr over 30 Minutes Intravenous 60 min pre-op 06/28/11 1319 06/29/11 0757          Assessment/Plan: s/p Procedure(s): LAPAROSCOPIC CHOLECYSTECTOMY WITH INTRAOPERATIVE CHOLANGIOGRAM Advance diet Discharge  LOS: 1 day    TOTH III,PAUL S 06/30/2011

## 2011-06-30 NOTE — Progress Notes (Signed)
Discharged home. Patient is alert and oriented x4, not in any distress, c/o of mild pain in the incision site, pain med given.Home discharge instruction given, no question verbalized.

## 2011-07-02 NOTE — Discharge Summary (Signed)
Physician Discharge Summary  Patient ID: Beverly Johnson MRN: [id number] DOB/AGE: 22-Dec-1975 35 y.o.  Admit date: 06/29/2011 Discharge date: 07/02/2011  Admission Diagnoses:  Discharge Diagnoses:  Active Problems:  Cholelithiasis   Discharged Condition: good  Hospital Course: Patient had surgery. She tolerated it well. She was discharged the following morning.   Consults: none  Significant Diagnostic Studies: none   Treatments: surgery: Lap Cole with ioc  Discharge Exam: Blood pressure 135/82, pulse 71, temperature 98.5 F (36.9 C), temperature source Oral, resp. rate 18, height 5\' 6"  (1.676 m), weight 270 lb (122.471 kg), last menstrual period 06/09/2011, SpO2 100.00%. See daily note  Disposition: Home or Self Care  Discharge Orders    Future Appointments: Provider: Department: Dept Phone: Center:   07/07/2011 10:15 AM Staci Righter, MD [facility] [phone] [facility]     Future Orders Please Complete By Expires   Diet - low sodium heart healthy      Increase activity slowly      Discharge instructions      Comments:   [facility] ______[facility] LAPAROSCOPIC SURGERY: POST OP INSTRUCTIONS Always review your discharge instruction sheet given to you by the facility where your surgery was performed. IF YOU HAVE DISABILITY OR FAMILY LEAVE FORMS, YOU MUST BRING THEM TO THE OFFICE FOR PROCESSING.   DO NOT GIVE THEM TO YOUR DOCTOR.  A prescription for pain medication may be given to you upon discharge.  Take your pain medication as prescribed, if needed.  If narcotic pain medicine is not needed, then you may take acetaminophen (Tylenol) or ibuprofen (Advil) as needed. Take your usually prescribed medications unless otherwise directed. If you need a refill on your pain medication, please contact your pharmacy.  They will contact our office to request authorization. Prescriptions will not be filled after 5pm or on week-ends. You should follow a light diet  the first few days after arrival home, such as soup and crackers, etc.  Be sure to include lots of fluids daily. Most patients will experience some swelling and bruising in the area of the incisions.  Ice packs will help.  Swelling and bruising can take several days to resolve.  It is common to experience some constipation if taking pain medication after surgery.  Increasing fluid intake and taking a stool softener (such as Colace) will usually help or prevent this problem from occurring.  A mild laxative (Milk of Magnesia or Miralax) should be taken according to package instructions if there are no bowel movements after 48 hours. Unless discharge instructions indicate otherwise, you may remove your bandages 24-48 hours after surgery, and you may shower at that time.  You may have steri-strips (small skin tapes) in place directly over the incision.  These strips should be left on the skin for 7-10 days.  If your surgeon used skin glue on the incision, you may shower in 24 hours.  The glue will flake off over the next 2-3 weeks.  Any sutures or staples will be removed at the office during your follow-up visit. ACTIVITIES:  You may resume regular (light) daily activities beginning the next day-such as daily self-care, walking, climbing stairs-gradually increasing activities as tolerated.  You may have sexual intercourse when it is comfortable.  Refrain from any heavy lifting or straining until approved by your doctor. You may drive when you are no longer taking prescription pain medication, you can comfortably wear a seatbelt, and you can safely maneuver your car and apply brakes. RETURN TO  WORK:  __________________________________________________________ Beverly Johnson should see your doctor in the office for a follow-up appointment approximately 2-3 weeks after your surgery.  Make sure that you call for this appointment within a day or two after you arrive home to insure a convenient appointment time. OTHER  INSTRUCTIONS: __________________________________________________________________________________________________________________________ __________________________________________________________________________________________________________________________ WHEN TO CALL YOUR DOCTOR: Fever over 101.0 Inability to urinate Continued bleeding from incision. Increased pain, redness, or drainage from the incision. Increasing abdominal pain  The clinic staff is available to answer your questions during regular business hours.  Please don't hesitate to call and ask to speak to one of the nurses for clinical concerns.  If you have a medical emergency, go to the nearest emergency room or call 911.  A surgeon from [facility] is always on call at the hospital. [address], [place], [place]  [address] ? [address] [address], [place], [place]   [address] [phone] ? [phone] ? FAX [phone] Web site: [email or web address]   Lifting restrictions      Comments:   Do not lift more than 5lbs for next 2 weeks   No wound care      No dressing needed      Call MD for:  temperature >100.4      Call MD for:  persistant nausea and vomiting      Call MD for:  severe uncontrolled pain      Call MD for:  redness, tenderness, or signs of infection (pain, swelling, redness, odor or green/yellow discharge around incision site)      Call MD for:  difficulty breathing, headache or visual disturbances      Call MD for:  hives      Call MD for:  persistant dizziness or light-headedness      Call MD for:  extreme fatigue        Discharge Medication List as of 06/30/2011 12:37 PM    START taking these medications   Details  HYDROcodone-acetaminophen (NORCO) 5-325 MG per tablet Take 1-2 tablets by mouth every 4 (four) hours as needed., Starting 06/30/2011, Until Fri 07/10/11, Print      CONTINUE these medications which have NOT CHANGED   Details    efavirenz-emtrictabine-tenofovir (ATRIPLA) 600-200-300 MG per tablet Take 1 tablet by mouth at bedtime.  , Until Discontinued, Historical Med      STOP taking these medications     ciprofloxacin (CIPRO) 500 MG tablet        Follow-up Information    Follow up with TOTH III,Tamecca Artiga S, MD in 2 weeks.   Contact information:   [employer] [address] [place] [address] [phone]          Signed: Robyne Askew 07/02/2011, 9:22 AM

## 2011-07-03 ENCOUNTER — Encounter (HOSPITAL_COMMUNITY): Payer: Self-pay | Admitting: General Surgery

## 2011-07-06 ENCOUNTER — Ambulatory Visit: Payer: Medicaid Other | Admitting: Adult Health

## 2011-07-07 ENCOUNTER — Encounter: Payer: Self-pay | Admitting: Internal Medicine

## 2011-07-07 ENCOUNTER — Ambulatory Visit (INDEPENDENT_AMBULATORY_CARE_PROVIDER_SITE_OTHER): Payer: Medicaid Other | Admitting: Internal Medicine

## 2011-07-07 VITALS — BP 114/78 | HR 81 | Temp 98.2°F | Wt 266.0 lb

## 2011-07-07 DIAGNOSIS — B20 Human immunodeficiency virus [HIV] disease: Secondary | ICD-10-CM

## 2011-07-07 NOTE — Patient Instructions (Signed)
Increase exercise 

## 2011-07-07 NOTE — Assessment & Plan Note (Addendum)
The patient has good continued compliance and tolerance to medications and is undetectable to. She will continue with medications and followup with her primary provider in 4 months. I did remind her of the need for condom use. I also discussed with her the benefits of increasing her activity and weight loss due to her long-term prognosis which is excellent with her good adherence of the medications. I did discuss HIV and heart disease which occurs later in life and therefore she needs to improve her activity. The patient did voice her understanding. She has no issues in regards to the recent cholecystectomy and does have followup with surgery arranged.  The patient did refuse her flu shot

## 2011-07-07 NOTE — Progress Notes (Signed)
  Subjective:    Patient ID: Beverly Johnson, female    DOB: 1975-09-07, 35 y.o.   MRN: [id number]  HPI the patient comes in today for routine followup of her HIV. The patient tells me she has excellent tolerance and adherence to the medications. She has no specific complaints today though does not verbalize much with me. She denies any dysphasia, weight loss, diarrhea or pain. She recently underwent laparoscopic cholecystectomy, about one week ago, and postop is doing well.    Review of Systems  Constitutional: Negative for fever, activity change, appetite change, fatigue and unexpected weight change.  HENT: Negative for sore throat and trouble swallowing.   Respiratory: Negative for cough and shortness of breath.   Cardiovascular: Negative for palpitations.  Gastrointestinal: Negative for nausea, diarrhea and constipation.  Genitourinary: Negative for dysuria.  Skin: Negative for rash.  Neurological: Negative for headaches.  Hematological: Negative for adenopathy.  Psychiatric/Behavioral: Negative for dysphoric mood.       Objective:   Physical Exam  Constitutional: She appears well-developed and well-nourished. No distress.  Cardiovascular: Normal rate, regular rhythm and normal heart sounds.  Exam reveals no gallop and no friction rub.   No murmur heard. Pulmonary/Chest: Effort normal and breath sounds normal. No respiratory distress. She has no wheezes. She has no rales.  Abdominal: Soft. Bowel sounds are normal. She exhibits no distension. There is no tenderness.       Lap incisions clean and dry  Lymphadenopathy:    She has no cervical adenopathy.  Skin: Skin is warm and dry. No erythema.  Psychiatric: She has a normal mood and affect. Her behavior is normal.          Assessment & Plan:

## 2011-07-28 ENCOUNTER — Encounter (INDEPENDENT_AMBULATORY_CARE_PROVIDER_SITE_OTHER): Payer: Self-pay | Admitting: General Surgery

## 2011-07-28 ENCOUNTER — Ambulatory Visit (INDEPENDENT_AMBULATORY_CARE_PROVIDER_SITE_OTHER): Payer: Medicaid Other | Admitting: General Surgery

## 2011-07-28 DIAGNOSIS — K8020 Calculus of gallbladder without cholecystitis without obstruction: Secondary | ICD-10-CM

## 2011-07-28 NOTE — Patient Instructions (Signed)
May return to all normal activities 

## 2011-08-12 NOTE — Progress Notes (Signed)
Subjective:     Patient ID: Beverly Johnson, female   DOB: 21-Aug-1975, 35 y.o.   MRN: [id number]  HPI The patient is a 35 yo female who is about 3 weeks s/p laparoscopic cholecystectomy. She has done well since surgery and has no complaints. Her appetite is good and her bowels are working normally  Review of Systems     Objective:   Physical Exam On exam her abdomen is soft and nontender. Her incisions are healing well    Assessment:     S/P lap chole    Plan:     At this point she can begin returning to her normal activities without any restrictions. F/U prn

## 2011-08-28 ENCOUNTER — Encounter: Payer: Self-pay | Admitting: *Deleted

## 2011-08-28 ENCOUNTER — Telehealth: Payer: Self-pay | Admitting: *Deleted

## 2011-08-28 NOTE — Telephone Encounter (Signed)
Unable to leave message.  Phone disconnected.  Will mail a letter to remind pt to call Center for pap smear appt.

## 2011-09-11 ENCOUNTER — Ambulatory Visit (INDEPENDENT_AMBULATORY_CARE_PROVIDER_SITE_OTHER): Payer: Medicaid Other | Admitting: *Deleted

## 2011-09-11 ENCOUNTER — Other Ambulatory Visit (HOSPITAL_COMMUNITY)
Admission: RE | Admit: 2011-09-11 | Discharge: 2011-09-11 | Disposition: A | Discharge status: Home / Self Care | Payer: Medicaid Other | Source: Ambulatory Visit | Attending: Infectious Diseases | Admitting: Infectious Diseases

## 2011-09-11 DIAGNOSIS — Z01419 Encounter for gynecological examination (general) (routine) without abnormal findings: Secondary | ICD-10-CM | POA: Insufficient documentation

## 2011-09-11 DIAGNOSIS — B20 Human immunodeficiency virus [HIV] disease: Secondary | ICD-10-CM

## 2011-09-11 DIAGNOSIS — Z124 Encounter for screening for malignant neoplasm of cervix: Secondary | ICD-10-CM

## 2011-09-11 DIAGNOSIS — Z23 Encounter for immunization: Secondary | ICD-10-CM

## 2011-09-11 DIAGNOSIS — Z21 Asymptomatic human immunodeficiency virus [HIV] infection status: Secondary | ICD-10-CM

## 2011-09-11 NOTE — Progress Notes (Signed)
  Subjective:     Beverly Johnson is a 36 y.o. woman who comes in today for a  pap smear only.  Objective:    There were no vitals taken for this visit. Pelvic Exam: Pap smear obtained.   Assessment:    Screening pap smear.   Plan:    Follow up in one year, or as indicated by Pap results.  Pt given educational materials re:  HIV and women, BSE, nutrition, diet, exercise and self-esteem.

## 2011-09-11 NOTE — Patient Instructions (Signed)
  Your results will be ready in about a week.  I will mail them to you.  Thank you for coming to the Center for your care.

## 2011-09-24 ENCOUNTER — Encounter: Payer: Self-pay | Admitting: *Deleted

## 2011-11-12 ENCOUNTER — Encounter (HOSPITAL_COMMUNITY): Payer: Self-pay | Admitting: Emergency Medicine

## 2011-11-12 ENCOUNTER — Emergency Department (HOSPITAL_COMMUNITY)
Admission: EM | Admit: 2011-11-12 | Discharge: 2011-11-12 | Disposition: A | Discharge status: Home / Self Care | Payer: Medicaid Other | Attending: Emergency Medicine | Admitting: Emergency Medicine

## 2011-11-12 DIAGNOSIS — K3189 Other diseases of stomach and duodenum: Secondary | ICD-10-CM | POA: Insufficient documentation

## 2011-11-12 DIAGNOSIS — Z21 Asymptomatic human immunodeficiency virus [HIV] infection status: Secondary | ICD-10-CM | POA: Insufficient documentation

## 2011-11-12 DIAGNOSIS — K2990 Gastroduodenitis, unspecified, without bleeding: Secondary | ICD-10-CM | POA: Insufficient documentation

## 2011-11-12 DIAGNOSIS — K2970 Gastritis, unspecified, without bleeding: Secondary | ICD-10-CM | POA: Insufficient documentation

## 2011-11-12 DIAGNOSIS — M549 Dorsalgia, unspecified: Secondary | ICD-10-CM | POA: Insufficient documentation

## 2011-11-12 LAB — DIFFERENTIAL
Basophils Absolute: 0 10*3/uL (ref 0.0–0.1)
Lymphocytes Relative: 41 % (ref 12–46)
Lymphs Abs: 2 10*3/uL (ref 0.7–4.0)
Monocytes Absolute: 0.2 10*3/uL (ref 0.1–1.0)
Neutro Abs: 2.6 10*3/uL (ref 1.7–7.7)

## 2011-11-12 LAB — COMPREHENSIVE METABOLIC PANEL
ALT: 20 U/L (ref 0–35)
AST: 22 U/L (ref 0–37)
CO2: 25 mEq/L (ref 19–32)
Chloride: 102 mEq/L (ref 96–112)
Creatinine, Ser: 0.67 mg/dL (ref 0.50–1.10)
GFR calc non Af Amer: >90 mL/min (ref >90)
Glucose, Bld: 89 mg/dL (ref 70–99)
Sodium: 136 mEq/L (ref 135–145)
Total Bilirubin: 0.3 mg/dL (ref 0.3–1.2)

## 2011-11-12 LAB — CBC
HCT: 38 % (ref 36.0–46.0)
RBC: 4.59 MIL/uL (ref 3.87–5.11)
RDW: 13 % (ref 11.5–15.5)
WBC: 4.9 10*3/uL (ref 4.0–10.5)

## 2011-11-12 MED ORDER — FAMOTIDINE 20 MG PO TABS: 20.0000 mg | ORAL_TABLET | Freq: Two times a day (BID) | ORAL | Status: DC

## 2011-11-12 MED ORDER — GI COCKTAIL ~~LOC~~
30.0000 mL | Freq: Once | ORAL | Status: AC
  Administered 2011-11-12: 30 mL via ORAL
  Filled 2011-11-12: qty 30

## 2011-11-12 MED ORDER — OMEPRAZOLE 20 MG PO CPDR: 20.0000 mg | DELAYED_RELEASE_CAPSULE | Freq: Every day | ORAL | Status: DC

## 2011-11-12 MED ORDER — PANTOPRAZOLE SODIUM 40 MG PO TBEC
40.0000 mg | DELAYED_RELEASE_TABLET | Freq: Once | ORAL | Status: AC
  Administered 2011-11-12: 40 mg via ORAL
  Filled 2011-11-12: qty 1

## 2011-11-12 MED ORDER — FAMOTIDINE 20 MG PO TABS
20.0000 mg | ORAL_TABLET | Freq: Once | ORAL | Status: AC
  Administered 2011-11-12: 20 mg via ORAL
  Filled 2011-11-12: qty 1

## 2011-11-12 NOTE — ED Notes (Signed)
PT. REPORTS RIGHT BACK PAIN , SORE THROAT AND GASTRIC REFLUX FOR SEVERAL DAYS .

## 2011-11-12 NOTE — Discharge Instructions (Signed)
You were evaluated today for your complaints of abdominal pain. You have decided to leave AGAINST MEDICAL ADVICE before getting the rest of your lab test results back. If you have any worsening pains or wished to have continued evaluation and treatment of your symptoms and return to the emergency room at any time. You should return for worsening pain, fever, chills, persistent nausea vomiting. Please followup with the primary care provider.   Gastritis Gastritis is an inflammation (the body's way of reacting to injury and/or infection) of the stomach. It is often caused by viral or bacterial (germ) infections. It can also be caused by chemicals (including alcohol) and medications. This illness may be associated with generalized malaise (feeling tired, not well), cramps, and fever. The illness may last 2 to 7 days. If symptoms of gastritis continue, gastroscopy (looking into the stomach with a telescope-like instrument), biopsy (taking tissue samples), and/or blood tests may be necessary to determine the cause. Antibiotics will not affect the illness unless there is a bacterial infection present. One common bacterial cause of gastritis is an organism known as H. Pylori. This can be treated with antibiotics. Other forms of gastritis are caused by too much acid in the stomach. They can be treated with medications such as H2 blockers and antacids. Home treatment is usually all that is needed. Young children will quickly become dehydrated (loss of body fluids) if vomiting and diarrhea are both present. Medications may be given to control nausea. Medications are usually not given for diarrhea unless especially bothersome. Some medications slow the removal of the virus from the gastrointestinal tract. This slows down the healing process. HOME CARE INSTRUCTIONS Home care instructions for nausea and vomiting:  For adults: drink small amounts of fluids often. Drink at least 2 quarts a day. Take sips frequently. Do not  drink large amounts of fluid at one time. This may worsen the nausea.   Only take over-the-counter or prescription medicines for pain, discomfort, or fever as directed by your caregiver.   Drink clear liquids only. Those are anything you can see through such as water, broth, or soft drinks.   Once you are keeping clear liquids down, you may start full liquids, soups, juices, and ice cream or sherbet. Slowly add bland (plain, not spicy) foods to your diet.  Home care instructions for diarrhea:  Diarrhea can be caused by bacterial infections or a virus. Your condition should improve with time, rest, fluids, and/or anti-diarrheal medication.   Until your diarrhea is under control, you should drink clear liquids often in small amounts. Clear liquids include: water, broth, jell-o water and weak tea.  Avoid:  Milk.   Fruits.   Tobacco.   Alcohol.   Extremely hot or cold fluids.   Too much intake of anything at one time.  When your diarrhea stops you may add the following foods, which help the stool to become more formed:  Rice.   Bananas.   Apples without skin.   Dry toast.  Once these foods are tolerated you may add low-fat yogurt and low-fat cottage cheese. They will help to restore the normal bacterial balance in your bowel. Wash your hands well to avoid spreading bacteria (germ) or virus. SEEK IMMEDIATE MEDICAL CARE IF:   You are unable to keep fluids down.   Vomiting or diarrhea become persistent (constant).   Abdominal pain develops, increases, or localizes. (Right sided pain can be appendicitis. Left sided pain in adults can be diverticulitis.)   You develop a fever (  an oral temperature above 102 F (38.9 C)).   Diarrhea becomes excessive or contains blood or mucus.   You have excessive weakness, dizziness, fainting or extreme thirst.   You are not improving or you are getting worse.   You have any other questions or concerns.  Document Released: 07/28/2001  Document Revised: 07/23/2011 Document Reviewed: 08/03/2005 [facility] Patient Information 2012 [place], [place].   RESOURCE GUIDE  Dental Problems  Patients with Medicaid: [facility] [phone] [address]                                           [phone] [address] Phone:  [phone]                                                  Phone:  [phone]  If unable to pay or uninsured, contact:  [facility] or [facility]. to become qualified for the adult dental clinic.  Chronic Pain Problems Contact [facility]  [phone] Patients need to be referred by their primary care doctor.  Insufficient Money for Medicine Contact [employer] Way:  call "211" or [facility] 413-838-9544.  No Primary Care Doctor Call [facility]  ([phone] Other agencies that provide inexpensive medical care    [facility]  [phone]    [facility]  [phone]    [facility]  [phone]    [facility]  [phone]    [facility]  [phone]    [facility]  2486869926  Psychological Services [facility]  [phone] [facility] Services  2480662692 [facility] Mental Health   ([phone] (emergency services 340-803-4894)  Substance Abuse Resources [facility]  6124598674 [facility] (843)846-0388 [facility] (825) 437-2876 Floyd[facility] Flock (415)225-9193 Residential & Outpatient Substance Abuse Program  260-450-6779  Abuse/Neglect [facility] Child Abuse Hotline 782-451-7658 [facility] Child Abuse Hotline 458-003-2210 (After Hours)  Emergency Shelter [facility] (909)784-8307  [facility] (514)064-9783 [facility] 667-243-5893  MRSA Hotline #:   361-723-4021    [facility] Resources  [facility]     [employer]                           [facility] Dept. [address] [place]                       [address]      [address]  Patrecia Pace  [facility] Phone:  [phone]                                   Phone:  [phone]                 Phone:  [place] Phone:  [place] [phone] (After Hours)

## 2011-11-12 NOTE — ED Provider Notes (Signed)
History     CSN: [id number]  Arrival date & time 11/12/11  1949   First MD Initiated Contact with Patient 11/12/11 2222      Chief Complaint  Patient presents with  . Back Pain     HPI  History provided by the patient. Patient is a 36 year old female with history of cholecystectomy, and HIV infection with last CD4 count greater than 500 presents with complaints of indigestion symptoms for the past week. Patient states that she began to feel burning pains up her chest and throat which became worse after eating. Pain has been persistent and now radiates some to bilateral mid back area. Patient has tried several home remedies including drinking milk and taking mustard seads. She has also used TUMS occasionally without significant relief of symptoms. Symptoms are described as moderate. Patient denies any other aggravating or alleviating factors. Patient denies having any other associated symptoms. She denies any fever, chills, sweats, nausea, vomiting, diaphoresis, heart palpitations, shortness of breath, diarrhea or constipation. Patient denies any changes in BMs, no dark tarry stools or blood in stool.    Past Medical History  Diagnosis Date  . Gallstones 03/10/2011  . HIV infection     Past Surgical History  Procedure Date  . No past surgeries   . Cholecystectomy 06/29/2011    Procedure: LAPAROSCOPIC CHOLECYSTECTOMY WITH INTRAOPERATIVE CHOLANGIOGRAM;  Surgeon: Robyne Askew, MD;  Location: [facility] OR;  Service: General;  Laterality: N/A;    No family history on file.  History  Substance Use Topics  . Smoking status: Never Smoker   . Smokeless tobacco: Never Used  . Alcohol Use: No    OB History    Grav Para Term Preterm Abortions TAB SAB Ect Mult Living                  Review of Systems  Constitutional: Negative for fever and chills.  HENT: Positive for sore throat. Negative for congestion, rhinorrhea, trouble swallowing and voice change.   Respiratory: Negative for  cough and shortness of breath.   Gastrointestinal: Negative for nausea, vomiting, diarrhea and constipation.  Genitourinary: Negative for dysuria, frequency, hematuria, flank pain, vaginal bleeding and vaginal discharge.    Allergies  Review of patient's allergies indicates no known allergies.  Home Medications   Current Outpatient Rx  Name Route Sig Dispense Refill  . EFAVIRENZ-EMTRICITAB-TENOFOVIR 600-200-300 MG PO TABS Oral Take 1 tablet by mouth at bedtime.        BP 126/75  Pulse 89  Temp(Src) 99.2 F (37.3 C) (Oral)  Resp 20  SpO2 100%  LMP 10/28/2011  Physical Exam  Nursing note and vitals reviewed. Constitutional: She is oriented to person, place, and time. She appears well-developed and well-nourished. No distress.  HENT:  Head: Normocephalic.  Mouth/Throat: Oropharynx is clear and moist.  Neck: Normal range of motion. Neck supple.  Cardiovascular: Normal rate and regular rhythm.   Pulmonary/Chest: Effort normal and breath sounds normal. No respiratory distress. She has no wheezes. She has no rales.  Abdominal: Soft. She exhibits no distension. There is tenderness in the epigastric area.       Mild pains.  Musculoskeletal: She exhibits no edema and no tenderness.  Lymphadenopathy:    She has no cervical adenopathy.  Neurological: She is alert and oriented to person, place, and time.  Skin: Skin is warm and dry. No rash noted.  Psychiatric: She has a normal mood and affect. Her behavior is normal.    ED Course  Procedures   Results for orders placed during the hospital encounter of 11/12/11  CBC      Component Value Range   WBC 4.9  4.0 - 10.5 (K/uL)   RBC 4.59  3.87 - 5.11 (MIL/uL)   Hemoglobin 12.0  12.0 - 15.0 (g/dL)   HCT 16.1  09.6 - 04.5 (%)   MCV 82.8  78.0 - 100.0 (fL)   MCH 26.1  26.0 - 34.0 (pg)   MCHC 31.6  30.0 - 36.0 (g/dL)   RDW 40.9  81.1 - 91.4 (%)   Platelets 157  150 - 400 (K/uL)  DIFFERENTIAL      Component Value Range    Neutrophils Relative 53  43 - 77 (%)   Neutro Abs 2.6  1.7 - 7.7 (K/uL)   Lymphocytes Relative 41  12 - 46 (%)   Lymphs Abs 2.0  0.7 - 4.0 (K/uL)   Monocytes Relative 5  3 - 12 (%)   Monocytes Absolute 0.2  0.1 - 1.0 (K/uL)   Eosinophils Relative 1  0 - 5 (%)   Eosinophils Absolute 0.1  0.0 - 0.7 (K/uL)   Basophils Relative 0  0 - 1 (%)   Basophils Absolute 0.0  0.0 - 0.1 (K/uL)       1. Gastritis       MDM  10:20 PM patient seen and evaluated. Patient in no acute distress.  Pain is not pleuritic. Patient with no respiratory complaints. Patient is PERC negative.   11:50 PM patient states that she needs to leave to catch the bus and does not want to wait for any further test results. Patient does report feeling much better after medications. Patient was warned of concerning symptoms for which she should return and for the possibility for more emergent condition causing her symptoms. She expressed her understanding and still wished to leave.     Angus Seller, [place] 11/12/11 2356

## 2011-11-13 NOTE — ED Provider Notes (Signed)
Medical screening examination/treatment/procedure(s) were performed by non-physician practitioner and as supervising physician I was immediately available for consultation/collaboration.   Carleene Cooper III, MD 11/13/11 [phone]

## 2011-11-17 ENCOUNTER — Other Ambulatory Visit: Payer: Medicaid Other

## 2011-11-27 ENCOUNTER — Other Ambulatory Visit: Payer: Medicaid Other

## 2011-12-01 ENCOUNTER — Ambulatory Visit: Payer: Medicaid Other | Admitting: Internal Medicine

## 2011-12-10 ENCOUNTER — Ambulatory Visit: Payer: Medicaid Other | Admitting: Internal Medicine

## 2011-12-24 ENCOUNTER — Other Ambulatory Visit: Payer: Medicaid Other

## 2011-12-24 DIAGNOSIS — B20 Human immunodeficiency virus [HIV] disease: Secondary | ICD-10-CM

## 2011-12-24 LAB — CBC WITH DIFFERENTIAL/PLATELET
Eosinophils Absolute: 0 10*3/uL (ref 0.0–0.7)
Lymphocytes Relative: 38 % (ref 12–46)
Lymphs Abs: 1.4 10*3/uL (ref 0.7–4.0)
MCH: 27.1 pg (ref 26.0–34.0)
Neutrophils Relative %: 55 % (ref 43–77)
Platelets: 161 10*3/uL (ref 150–400)
RBC: 4.54 MIL/uL (ref 3.87–5.11)
WBC: 3.8 10*3/uL — ABNORMAL LOW (ref 4.0–10.5)

## 2011-12-24 LAB — COMPLETE METABOLIC PANEL WITH GFR
BUN: 9 mg/dL (ref 6–23)
CO2: 22 mEq/L (ref 19–32)
Calcium: 9.3 mg/dL (ref 8.4–10.5)
Chloride: 105 mEq/L (ref 96–112)
Creat: 0.71 mg/dL (ref 0.50–1.10)
GFR, Est African American: >89 mL/min
GFR, Est Non African American: >89 mL/min

## 2011-12-25 LAB — T-HELPER CELL (CD4) - (RCID CLINIC ONLY): CD4 T Cell Abs: 440 uL (ref 400–2700)

## 2011-12-29 ENCOUNTER — Ambulatory Visit: Payer: Medicaid Other | Admitting: Internal Medicine

## 2012-01-07 ENCOUNTER — Ambulatory Visit (INDEPENDENT_AMBULATORY_CARE_PROVIDER_SITE_OTHER): Payer: Medicaid Other | Admitting: Internal Medicine

## 2012-01-07 ENCOUNTER — Encounter: Payer: Self-pay | Admitting: Internal Medicine

## 2012-01-07 VITALS — BP 116/82 | HR 79 | Temp 98.5°F | Ht 66.0 in | Wt 175.0 lb

## 2012-01-07 DIAGNOSIS — B20 Human immunodeficiency virus [HIV] disease: Secondary | ICD-10-CM

## 2012-01-07 NOTE — Progress Notes (Signed)
  Subjective:    Patient ID: Beverly Johnson, female    DOB: 02/17/1976, 36 y.o.   MRN: [id number]  HPI This patient comes in for followup of her HIV. She was last seen about 6 months ago and was doing well and undetectable at that time. Her most recent labs also show a good CD4 count though the viral load has increased to 80. In discussion with her during this appointment, she tells me that she stopped her medications about the time of her last lab visit. She does not give a specific reason for stopping it only just not wanting to take it. She's had no specific problems with the medication she just tells me that she feels well and doesn't want to take it anymore. He does not feel she has significant side effects the medication but does endorse difficulty taking it daily. She tells me she has been "too busy" to take it sometimes. And apparently she has missed a number of doses. She denies feeling bad, she denies any intention of suicidal ideation and denies wanting to die. She lives at home with her husband and young children.   Review of Systems  Constitutional: Negative for fever, chills, appetite change and unexpected weight change.  HENT: Negative for sore throat and trouble swallowing.   Respiratory: Negative for cough, shortness of breath and wheezing.   Cardiovascular: Negative for chest pain, palpitations and leg swelling.  Gastrointestinal: Negative for nausea, abdominal pain and diarrhea.  Musculoskeletal: Negative for myalgias, joint swelling and arthralgias.  Skin: Negative for pallor and rash.  Neurological: Negative for dizziness, weakness and headaches.  Hematological: Negative for adenopathy.  Psychiatric/Behavioral: Negative for suicidal ideas, behavioral problems, confusion, sleep disturbance, dysphoric mood and decreased concentration. The patient is not nervous/anxious.        Objective:   Physical Exam  Constitutional: She appears well-developed and well-nourished. No  distress.       Morbidly obese  HENT:  Mouth/Throat: Oropharynx is clear and moist. No oropharyngeal exudate.  Cardiovascular: Normal rate, regular rhythm and normal heart sounds.  Exam reveals no gallop and no friction rub.   No murmur heard. Pulmonary/Chest: Effort normal and breath sounds normal. No respiratory distress. She has no wheezes. She has no rales.  Lymphadenopathy:    She has no cervical adenopathy.  Psychiatric:       Flat affect, minimal interaction.          Assessment & Plan:

## 2012-01-07 NOTE — Assessment & Plan Note (Signed)
I did spend time with her to discuss the risks of stopping therapy. Most predominantly I discussed with her the fact that without therapy she will die within a few years whereas if she takes the medicine daily she can have a long life. The patient did voice her understanding of the risks of not taking medication however continued to repeat that she feels well and does not want to take the medication. She did state that she does get some sleepiness with the medication however she is not interested in changing her therapy to anything else. By the end of the appointment, the patient was considering restarting the medication and did tell me at the end that she was going to restart. She did continue to have a flat affect but hopefully will start. I did tell her that the risks of stopping medicine and starting again are that it became resistant and that she will need to be followed closely with a repeat lab in about 4-6 weeks including a genotype. The patient voiced her understanding and will return in about 6 weeks to see if there is any resistance at that time. She does not want any mental health intervention and has no active suicidal ideation therefore will be monitored closely. She will return 2 weeks after her lab visit to further discuss. At this visit I will reassess adherence and understanding.

## 2012-02-23 ENCOUNTER — Other Ambulatory Visit: Payer: Medicaid Other

## 2012-02-23 DIAGNOSIS — B20 Human immunodeficiency virus [HIV] disease: Secondary | ICD-10-CM

## 2012-02-25 LAB — HIV-1 RNA ULTRAQUANT REFLEX TO GENTYP+: HIV-1 RNA Quant, Log: <1.3 {Log} (ref <1.30)

## 2012-03-08 ENCOUNTER — Encounter: Payer: Self-pay | Admitting: Internal Medicine

## 2012-03-08 ENCOUNTER — Ambulatory Visit (INDEPENDENT_AMBULATORY_CARE_PROVIDER_SITE_OTHER): Payer: Medicaid Other | Admitting: Internal Medicine

## 2012-03-08 VITALS — BP 108/75 | HR 79 | Temp 98.5°F | Ht 66.0 in | Wt 272.0 lb

## 2012-03-08 DIAGNOSIS — E119 Type 2 diabetes mellitus without complications: Secondary | ICD-10-CM

## 2012-03-08 DIAGNOSIS — B20 Human immunodeficiency virus [HIV] disease: Secondary | ICD-10-CM

## 2012-03-08 NOTE — Progress Notes (Signed)
  Subjective:    Patient ID: Beverly Johnson, female    DOB: 02-Jun-1976, 36 y.o.   MRN: [id number]  HPI She comes in here for followup of her HIV. She was seen about 2 months ago and endorsed stopping her medication. She had no particular reason other than feeling that she did not have time to take the medicine. She was depressed though denied any suicidal ideation. She had no particular reason or complication with the medication. She comes in now for followup after having just restarted her medicine. She reports excellent compliance and good tolerance with no issues with the medication. In fact her labs show that she is again undetectable with a good CD4 count. She has no complaints today and is happy to have lost several pounds. She continues to try to improve her diet and does have her young children that keep her active. No complaints and no recent hospitalizations   Review of Systems  Constitutional: Negative for fever, chills, fatigue and unexpected weight change.  HENT: Negative for sore throat and trouble swallowing.   Respiratory: Negative for cough and shortness of breath.   Cardiovascular: Negative for chest pain, palpitations and leg swelling.  Gastrointestinal: Negative for nausea, abdominal pain, diarrhea and constipation.  Musculoskeletal: Negative for myalgias, joint swelling and arthralgias.  Skin: Negative for rash.  Neurological: Negative for dizziness, weakness and headaches.  Hematological: Negative for adenopathy.  Psychiatric/Behavioral: Negative for dysphoric mood. The patient is not nervous/anxious.        Objective:   Physical Exam  Constitutional: She appears well-developed and well-nourished. No distress.  HENT:  Mouth/Throat: Oropharynx is clear and moist. No oropharyngeal exudate.  Cardiovascular: Normal rate, regular rhythm and normal heart sounds.  Exam reveals no gallop and no friction rub.   No murmur heard. Pulmonary/Chest: Effort normal and breath sounds  normal. No respiratory distress. She has no wheezes. She has no rales.  Abdominal: Soft. Bowel sounds are normal. She exhibits no distension. There is no tenderness. There is no rebound.  Lymphadenopathy:    She has no cervical adenopathy.          Assessment & Plan:

## 2012-03-08 NOTE — Assessment & Plan Note (Signed)
She is now doing well back on her medications. I encouraged her to have continued complaints and she is doing well and she is tolerating it very well. She has no complaints today. Encouraged continued weight loss as well. She does carry a diagnosis of diabetes as well and we'll check check a hemoglobin A1c next visit. If she has a significant elevation, I will refer her for a primary physician

## 2012-03-14 ENCOUNTER — Telehealth: Payer: Self-pay | Admitting: *Deleted

## 2012-03-14 NOTE — Telephone Encounter (Signed)
Requesting assistance with obtaining diabetic supplies to self-monitor her diabetes.  The pt remembers being seen "at another building on [address]" for this problem.  Does not currently have a PCP that manages her diabetes.  MD please advise.

## 2012-03-15 NOTE — Telephone Encounter (Signed)
She should get a PCP

## 2012-03-16 ENCOUNTER — Other Ambulatory Visit: Payer: Self-pay | Admitting: Internal Medicine

## 2012-03-16 DIAGNOSIS — E119 Type 2 diabetes mellitus without complications: Secondary | ICD-10-CM

## 2012-03-16 NOTE — Telephone Encounter (Addendum)
Pt's Medicaid is for [facility].  Will refer there for Primary Care needs.

## 2012-04-27 ENCOUNTER — Ambulatory Visit (INDEPENDENT_AMBULATORY_CARE_PROVIDER_SITE_OTHER): Payer: Medicaid Other | Admitting: Internal Medicine

## 2012-04-27 ENCOUNTER — Encounter: Payer: Self-pay | Admitting: Internal Medicine

## 2012-04-27 VITALS — BP 126/74 | HR 101 | Temp 98.1°F | Ht 66.0 in | Wt 279.2 lb

## 2012-04-27 DIAGNOSIS — Z9049 Acquired absence of other specified parts of digestive tract: Secondary | ICD-10-CM

## 2012-04-27 DIAGNOSIS — E669 Obesity, unspecified: Secondary | ICD-10-CM

## 2012-04-27 DIAGNOSIS — E6609 Other obesity due to excess calories: Secondary | ICD-10-CM | POA: Insufficient documentation

## 2012-04-27 DIAGNOSIS — Z6841 Body Mass Index (BMI) 40.0 and over, adult: Secondary | ICD-10-CM

## 2012-04-27 DIAGNOSIS — B20 Human immunodeficiency virus [HIV] disease: Secondary | ICD-10-CM

## 2012-04-27 DIAGNOSIS — E119 Type 2 diabetes mellitus without complications: Secondary | ICD-10-CM

## 2012-04-27 DIAGNOSIS — Z79899 Other long term (current) drug therapy: Secondary | ICD-10-CM

## 2012-04-27 DIAGNOSIS — Z9089 Acquired absence of other organs: Secondary | ICD-10-CM

## 2012-04-27 HISTORY — DX: Obesity, unspecified: E66.9

## 2012-04-27 LAB — CBC
HCT: 36.1 % (ref 36.0–46.0)
Hemoglobin: 12.1 g/dL (ref 12.0–15.0)
MCH: 26.9 pg (ref 26.0–34.0)
MCV: 80.4 fL (ref 78.0–100.0)
Platelets: 172 10*3/uL (ref 150–400)
RBC: 4.49 MIL/uL (ref 3.87–5.11)
RDW: 14.2 % (ref 11.5–15.5)
WBC: 4.6 10*3/uL (ref 4.0–10.5)

## 2012-04-27 LAB — GLUCOSE, CAPILLARY: Glucose-Capillary: 183 mg/dL — ABNORMAL HIGH (ref 70–99)

## 2012-04-27 NOTE — Progress Notes (Addendum)
Subjective:   Patient ID: Beverly Johnson female   DOB: Aug 17, 1976 36 y.o.   MRN: [id number]  HPI: [name] is a 36 y.o. African American morbidly obese female with PMH of HIV on Atripla and s/p cholecystectomy presenting to clinic today for new patient visit and evaluation.  [name] has no complaints at this time and was told she should have a PCP by her other physicians.  [name] claims she was told she had diabetes last year however never followed up, was never started on any medicine, and has not checked her blood glucose levels since then.  Her A1c today in clinic was 6.5 and she was counseled extensively on diabetes, diet control, weight loss, glucose monitoring, and exercise.  She does not like taking medications and is apprehensive about having to start any medications for diabetes, but is willing to check her glucose levels regularly.  [name] claims to have a sedentary lifestyle at home watching tv and taking care of her three children, however, she has started to cook more at home and eat healthier and walking more than normal.  She would like to lose weight but finds it very difficult.  She has no other major complaints at this time.  She denies any other major health problems other than gallstones in the past resulting in cholecystectomy and diagnosis of HIV approximately 6 years ago and she is with her partner who she apparently acquired it from.  She was counseled on still using contraception with her partner.  She denies any nausea, vomiting, diarrhea, fever, chills, abdominal pain, chest pain, shortness of breath, or any urinary complaints at this time.  [name] initially did not want to meet with anyone in regards to her diabetes however after further discussion she agrees to meet with our diabetes coordinator in clinic and we will then proceed with ordering correct glucose monitoring device and supplies per her recommendations and evaluation.  She does not like needles and  refuses to get Tetanus and influenza vaccine at this time.  [name] regularly follows up with Dr. Luciana Axe in ID clinic and has been adherent with her HIV medication recently with low viral load and CD4 count recently ~500.    Past Medical History  Diagnosis Date  . Gallstones 03/10/2011  . HIV infection    Current Outpatient Prescriptions  Medication Sig Dispense Refill  . efavirenz-emtrictabine-tenofovir (ATRIPLA) 600-200-300 MG per tablet Take 1 tablet by mouth at bedtime.         No family history on file. History   Social History  . Marital Status: Married    Spouse Name: N/A    Number of Children: N/A  . Years of Education: N/A   Social History Main Topics  . Smoking status: Never Smoker   . Smokeless tobacco: Never Used  . Alcohol Use: No  . Drug Use: No  . Sexually Active: None     pt. declined condoms   Other Topics Concern  . None   Social History Narrative  . None   Review of Systems: Constitutional: Denies fever, chills, diaphoresis, appetite change and fatigue. Morbid obesity.   HEENT: Denies photophobia, eye pain, redness, hearing loss, ear pain, congestion, sore throat, rhinorrhea, sneezing, mouth sores, trouble swallowing, neck pain, neck stiffness and tinnitus.   Respiratory: Denies SOB, DOE, cough, chest tightness,  and wheezing.   Cardiovascular: Denies chest pain, palpitations and leg swelling.  Gastrointestinal: Denies nausea, vomiting, abdominal pain, diarrhea, constipation, blood in stool  and abdominal distention.  Genitourinary: Denies dysuria, urgency, frequency, hematuria, flank pain and difficulty urinating.  Musculoskeletal: Denies myalgias, back pain, joint swelling, arthralgias and gait problem.  Skin: Denies pallor and wound. +Eczema on neck.   Neurological: Denies dizziness, seizures, syncope, weakness, light-headedness, numbness and headaches.  Hematological: Denies adenopathy. Easy bruising, personal or family bleeding history    Psychiatric/Behavioral: Denies suicidal ideation, mood changes, confusion, nervousness, sleep disturbance and agitation  Objective:  Physical Exam: Filed Vitals:   04/27/12 1336  BP: 126/74  Pulse: 101  Temp: 98.1 F (36.7 C)  TempSrc: Oral  Height: 5\' 6"  (1.676 m)  Weight: 279 lb 3.2 oz (126.644 kg)  SpO2: 98%   Constitutional: Vital signs reviewed.  Patient is a morbidly obese female in no acute distress and cooperative with exam. Alert and oriented x3.  Head: Normocephalic and atraumatic Ear: TM normal bilaterally Mouth: no erythema or exudates, MMM Eyes: PERRL, EOMI, conjunctivae normal, No scleral icterus.  Neck: Supple, Trachea midline normal ROM, +eczema patches on neck mainly on lateral and back surface.  Cardiovascular: RRR, S1 normal, S2 normal, no MRG, pulses symmetric and intact bilaterally Pulmonary/Chest: CTAB, no wheezes, rales, or rhonchi Abdominal: Soft. Obese, Non-tender, non-distended, bowel sounds are normal, no masses, organomegaly, or guarding present.  GU: no CVA tenderness Musculoskeletal: No joint deformities, erythema, or stiffness, ROM full and nontender.  Cracked right big toe nail.  +2dp b/l Hematology: no cervical, inginal, or axillary adenopathy.  Neurological: A&O x3, Strength is normal and symmetric bilaterally, cranial nerve II-XII are grossly intact, no focal motor deficit, sensory intact to light touch bilaterally.  Skin: Warm, dry and intact. No rash, cyanosis, or clubbing.  Psychiatric: Normal mood and affect. speech and behavior is normal. Judgment and thought content normal. Cognition and memory are normal.   Assessment & Plan:   INTERNAL MEDICINE TEACHING ATTENDING ADDENDUM - Lars Mage, MD: I personally saw and evaluated Ms Johnson in this clinic visit in conjunction with the resident, Dr. Virgina Organ. I have discussed the patient's plan of care with Dr. Virgina Organ during this visit. I have confirmed the physical exam findings and have read and  agree with the clinic note including the plan.

## 2012-04-27 NOTE — Patient Instructions (Addendum)
Please see Lupita Leash our diabetes educator on first available appointment, we can then order your diabetes supplies for glucose monitoring at this time  Continue working on your diet and weightloss as discussed today  Try to exercise at least 30 minutes each day  Return to clinic in one month for follow up with diary of glucose checks three times a day as discussed  Continue Atripla and follow up with Dr. Luciana Axe accordingly  Diabetes, Type 2 Diabetes is a long-lasting (chronic) disease. In type 2 diabetes, the pancreas does not make enough insulin (a hormone), and the body does not respond normally to the insulin that is made. This type of diabetes was also previously called adult-onset diabetes. It usually occurs after the age of 29, but it can occur at any age.  CAUSES  Type 2 diabetes happens because the pancreasis not making enough insulin or your body has trouble using the insulin that your pancreas does make properly. SYMPTOMS   Drinking more than usual.   Urinating more than usual.   Blurred vision.   Dry, itchy skin.   Frequent infections.   Feeling more tired than usual (fatigue).  DIAGNOSIS The diagnosis of type 2 diabetes is usually made by one of the following tests:  Fasting blood glucose test. You will not eat for at least 8 hours and then take a blood test.   Random blood glucose test. Your blood glucose (sugar) is checked at any time of the day regardless of when you ate.   Oral glucose tolerance test (OGTT). Your blood glucose is measured after you have not eaten (fasted) and then after you drink a glucose containing beverage.  TREATMENT   Healthy eating.   Exercise.   Medicine, if needed.   Monitoring blood glucose.   Seeing your caregiver regularly.  HOME CARE INSTRUCTIONS   Check your blood glucose at least once a day. More frequent monitoring may be necessary, depending on your medicines and on how well your diabetes is controlled. Your caregiver will  advise you.   Take your medicine as directed by your caregiver.   Do not smoke.   Make wise food choices. Ask your caregiver for information. Weight loss can improve your diabetes.   Learn about low blood glucose (hypoglycemia) and how to treat it.   Get your eyes checked regularly.   Have a yearly physical exam. Have your blood pressure checked and your blood and urine tested.   Wear a pendant or bracelet saying that you have diabetes.   Check your feet every night for cuts, sores, blisters, and redness. Let your caregiver know if you have any problems.  SEEK MEDICAL CARE IF:   You have problems keeping your blood glucose in target range.   You have problems with your medicines.   You have symptoms of an illness that do not improve after 24 hours.   You have a sore or wound that is not healing.   You notice a change in vision or a new problem with your vision.   You have a fever.  MAKE SURE YOU:  Understand these instructions.   Will watch your condition.   Will get help right away if you are not doing well or get worse.  Document Released: 08/03/2005 Document Revised: 07/23/2011 Document Reviewed: 01/19/2011 [facility] Patient Information 2012 [place], [place].  Diabetes and Exercise Regular exercise is important and can help:   Control blood glucose (sugar).   Decrease blood pressure.    Control blood lipids (  cholesterol, triglycerides).   Improve overall health.  BENEFITS FROM EXERCISE  Improved fitness.   Improved flexibility.   Improved endurance.   Increased bone density.   Weight control.   Increased muscle strength.   Decreased body fat.   Improvement of the body's use of insulin, a hormone.   Increased insulin sensitivity.   Reduction of insulin needs.   Reduced stress and tension.   Helps you feel better.  People with diabetes who add exercise to their lifestyle gain additional benefits, including:  Weight loss.   Reduced  appetite.   Improvement of the body's use of blood glucose.   Decreased risk factors for heart disease:   Lowering of cholesterol and triglycerides.   Raising the level of good cholesterol (high-density lipoproteins, HDL).   Lowering blood sugar.   Decreased blood pressure.  TYPE 1 DIABETES AND EXERCISE  Exercise will usually lower your blood glucose.   If blood glucose is greater than 240 mg/dl, check urine ketones. If ketones are present, do not exercise.   Location of the insulin injection sites may need to be adjusted with exercise. Avoid injecting insulin into areas of the body that will be exercised. For example, avoid injecting insulin into:   The arms when playing tennis.   The legs when jogging. For more information, discuss this with your caregiver.   Keep a record of:   Food intake.   Type and amount of exercise.   Expected peak times of insulin action.   Blood glucose levels.  Do this before, during, and after exercise. Review your records with your caregiver. This will help you to develop guidelines for adjusting food intake and insulin amounts.  TYPE 2 DIABETES AND EXERCISE  Regular physical activity can help control blood glucose.   Exercise is important because it may:   Increase the body's sensitivity to insulin.   Improve blood glucose control.   Exercise reduces the risk of heart disease. It decreases serum cholesterol and triglycerides. It also lowers blood pressure.   Those who take insulin or oral hypoglycemic agents should watch for signs of hypoglycemia. These signs include dizziness, shaking, sweating, chills, and confusion.   Body water is lost during exercise. It must be replaced. This will help to avoid loss of body fluids (dehydration) or heat stroke.  Be sure to talk to your caregiver before starting an exercise program to make sure it is safe for you. Remember, any activity is better than none.  Document Released: 10/24/2003 Document  Revised: 07/23/2011 Document Reviewed: 02/07/2009 [facility] Patient Information 2012 [place], [place].  Diabetes, Frequently Asked Questions WHAT IS DIABETES? Most of the food we eat is turned into glucose (sugar). Our bodies use it for energy. The pancreas makes a hormone called insulin. It helps glucose get into the cells of our bodies. When you have diabetes, your body either does not make enough insulin or cannot use its own insulin as well as it should. This causes sugars to build up in your blood. WHAT ARE THE SYMPTOMS OF DIABETES?  Frequent urination.   Excessive thirst.   Unexplained weight loss.   Extreme hunger.   Blurred vision.   Tingling or numbness in hands or feet.   Feeling very tired much of the time.   Dry, itchy skin.   Sores that are slow to heal.   Yeast infections.  WHAT ARE THE TYPES OF DIABETES? Type 1 Diabetes   About 10% of affected people have this type.   Usually occurs  before the age of 74.   Usually occurs in thin to normal weight people.  Type 2 Diabetes  About 90% of affected people have this type.   Usually occurs after the age of 52.   Usually occurs in overweight people.   More likely to have:   A family history of diabetes.   A history of diabetes during pregnancy (gestational diabetes).   High blood pressure.   High cholesterol and triglycerides.  Gestational Diabetes  Occurs in about 4% of pregnancies.   Usually goes away after the baby is born.   More likely to occur in women with:   Family history of diabetes.   Previous gestational diabetes.   Obese.   Over 21 years old.  WHAT IS PRE-DIABETES? Pre-diabetes means your blood glucose is higher than normal, but lower than the diabetes range. It also means you are at risk of getting type 2 diabetes and heart disease. If you are told you have pre-diabetes, have your blood glucose checked again in 1 to 2 years. WHAT IS THE TREATMENT FOR DIABETES? Treatment is aimed  at keeping blood glucose near normal levels at all times. Learning how to manage this yourself is important in treating diabetes. Depending on the type of diabetes you have, your treatment will include one or more of the following:  Monitoring your blood glucose.   Meal planning.   Exercise.   Oral medicine (pills) or insulin.  CAN DIABETES BE PREVENTED? With type 1 diabetes, prevention is more difficult, because the triggers that cause it are not yet known. With type 2 diabetes, prevention is more likely, with lifestyle changes:  Maintain a healthy weight.   Eat healthy.   Exercise.  IS THERE A CURE FOR DIABETES? No, there is no cure for diabetes. There is a lot of research going on that is looking for a cure, and progress is being made. Diabetes can be treated and controlled. People with diabetes can manage their diabetes and lead normal, active lives. SHOULD I BE TESTED FOR DIABETES? If you are at least 34 years old, you should be tested for diabetes. You should be tested again every 3 years. If you are 45 or older and overweight, you may want to get tested more often. If you are younger than 45, overweight, and have one or more of the following risk factors, you should be tested:  Family history of diabetes.   Inactive lifestyle.   High blood pressure.  WHAT ARE SOME OTHER SOURCES FOR INFORMATION ON DIABETES? The following organizations may help in your search for more information on diabetes: National Diabetes Education Program (NDEP) Internet: [employer] American Diabetes Association Internet: http://www.diabetes.org  Juvenile Diabetes Foundation International Internet: [employer] Document Released: 08/06/2003 Document Revised: 07/23/2011 Document Reviewed: 05/31/2009 [facility] Patient Information 2012 [place], [place].

## 2012-04-27 NOTE — Assessment & Plan Note (Addendum)
Counseled on weightloss especially in setting of diabetes and diet control along with exercise.    -continue to monitor -f/u diabetes education with Lupita Leash -f/u lipid panel

## 2012-04-27 NOTE — Assessment & Plan Note (Signed)
Followed closely by Dr. Luciana Axe, On Atripla, Last CD4 count 530 Viral load <20 (July 2013)  -Continue to follow with Dr. Luciana Axe -Continue Atripla -Counseled on contraception

## 2012-04-27 NOTE — Assessment & Plan Note (Signed)
Hx of gallstones, cholecystectomy November 2012

## 2012-04-27 NOTE — Assessment & Plan Note (Addendum)
Claims to have been diagnosed in 2012.  HbA1c today 6.5 on no medication, no monitor, does not check sugar at home  -Counseled extensively on diabetes education, monitoring of blood glucose, diet control, exercise -Referred to Lupita Leash for diabetes education, will order monitor and strips per her recommendations -Return to clinic in one month with diary of blood glucose checks TID -Counseled on weight loss and exercise -Will try conservative therapy with diet and exercise and weightloss at this time before starting medication -f/u cbc, cmp, lipid panel, urine microalbumin -referred to ophthalmologist for eye exam -foot exam done

## 2012-04-28 LAB — COMPLETE METABOLIC PANEL WITH GFR
ALT: 29 U/L (ref 0–35)
AST: 36 U/L (ref 0–37)
Albumin: 3.9 g/dL (ref 3.5–5.2)
Alkaline Phosphatase: 83 U/L (ref 39–117)
BUN: 9 mg/dL (ref 6–23)
Calcium: 9.4 mg/dL (ref 8.4–10.5)
Chloride: 104 mEq/L (ref 96–112)
Glucose, Bld: 144 mg/dL — ABNORMAL HIGH (ref 70–99)
Potassium: 3.9 mEq/L (ref 3.5–5.3)
Total Bilirubin: 0.5 mg/dL (ref 0.3–1.2)

## 2012-04-28 LAB — MICROALBUMIN / CREATININE URINE RATIO
Creatinine, Urine: 463.4 mg/dL
Microalb Creat Ratio: 1.6 mg/g (ref 0.0–30.0)
Microalb, Ur: 0.76 mg/dL (ref 0.00–1.89)

## 2012-04-28 LAB — LIPID PANEL
Cholesterol: 114 mg/dL (ref 0–200)
Total CHOL/HDL Ratio: 2.9 Ratio
VLDL: 32 mg/dL (ref 0–40)

## 2012-04-29 ENCOUNTER — Telehealth: Payer: Self-pay | Admitting: *Deleted

## 2012-04-29 NOTE — Telephone Encounter (Signed)
I would like her to been by Beverly Johnson in case we need to change her monitor and it becomes and issue plus she needs proper diabetic education as this is a new diagnosis and will need lifestyle changes and education.  She will also need to be taught how to use the machine and strips properly.  If we cannot get an appointment with Beverly Johnson as soon as possible please let me know and I can then order if necessary.    Thank you

## 2012-04-29 NOTE — Telephone Encounter (Signed)
Returned pt's call/message left.  Talked to Dr Virgina Organ yesterday; pt needs to scheduled an appt w/Donna Plyler - this was Told to pt. She said she was told otherwise and rxs for meter/strips would be sent to her pharmacy so she can start checking Her blood sugars. Left pt a message for to call and schedule an appt .

## 2012-05-02 NOTE — Telephone Encounter (Signed)
Pt has an appt w/Donna Plyler 05/04/12.

## 2012-05-04 ENCOUNTER — Other Ambulatory Visit: Payer: Self-pay | Admitting: Dietician

## 2012-05-04 ENCOUNTER — Ambulatory Visit (INDEPENDENT_AMBULATORY_CARE_PROVIDER_SITE_OTHER): Payer: Medicaid Other | Admitting: Dietician

## 2012-05-04 DIAGNOSIS — E119 Type 2 diabetes mellitus without complications: Secondary | ICD-10-CM

## 2012-05-04 MED ORDER — ACCU-CHEK FASTCLIX LANCETS MISC: 1.0000 | Freq: Two times a day (BID) | Status: DC

## 2012-05-04 MED ORDER — GLUCOSE BLOOD VI STRP: ORAL_STRIP | Status: DC

## 2012-05-04 MED ORDER — ACCU-CHEK NANO SMARTVIEW W/DEVICE KIT: 1.0000 | PACK | Freq: Two times a day (BID) | Status: DC

## 2012-05-04 NOTE — Progress Notes (Signed)
Diabetes Self-Management Training (DSMT)  Initial Visit  05/04/2012 [name], identified by name and date of birth, is a 36 y.o. female with Type 2 Diabetes. Year of diabetes diagnosis: 20  ASSESSMENT Patient concerns are Nutrition/meal planning, Monitoring and Weight control.  Last menstrual period 03/30/2012. There is no height or weight on file to calculate BMI. Lab Results  Component Value Date   LDLCALC 42 04/27/2012   Lab Results  Component Value Date   HGBA1C 6.5 04/27/2012   Labs reviewed.  Family history of diabetes: Not sure  Support systems: spouse- Who does not eat healthy per patient Special needs: None Prior DM Education: No Patients belief/attitude about diabetes: Diabetes can be controlled. Self foot exams daily: No Diabetes Complications: None   Medications See Medications list.  Is interested in learning more- when/if she is put on meds. No meds for diabetes now.   Exercise Plan Doing ADLs and walking for 30 minutesa day.   Self-Monitoring Medication Nutrition Monitor: gave her an accu check nano today CBG was 157 after coffee with 4 sugar and one cream Hyperglycemia: No Hypoglycemia: No   Meal Planning Limited knowledge and Interested in improving   Assessment comments: patient reports adequate resources for food shopping, yet goes to free meals at church for homeless. drinks koolaid, eats fried foods and sweets often    INDIVIDUAL DIABETES EDUCATION PLAN:  Nutrition management Physical activity and exercise Monitoring _______________________________________________________________________  Intervention TOPICS COVERED TODAY:  Nutrition management  Role of diet in the treatment of diabetes and the relationship between the three main macronutritents and blood glucose control. Reviewed blood glucose goals for pre and post meals and how to evaluate the patients' food intake on their blood glucose level. Physical activity and exercise   Role of exercise on diabetes management, blood pressure control and cardiac health. Monitoring  Taught/evaluated SMBG with accu check nano meter meter.  1.  Learning Objective:       State 2 things to change to assist with weight loss 2.  Behavioral Objective:         Nutrition: To improve blood glucose control I will follow meal plan of less sweet snd more fruit and veggies Never 0% Physical Activity: For improved blood glucose control and decrease insulin resistance, I will exercise 5 days a week  Sometimes 25%  Personalized Follow-Up Plan for Ongoing Self Management Support:  Doctor's Office, church, friends, family and CDE visits ______________________________________________________________________   Outcomes Expected outcomes: Demonstrated interest in learning.Expect positive changes in lifestyle. Self-care Barriers: Low literacy, Lack of transportation, Lack of material resources Education material provided: yes Patient to contact team via Phone if problems or questions. Time in: 0930     Time out: 1030 Future DSMT - 4-6 wks   Plyler, Lupita Leash

## 2012-05-04 NOTE — Patient Instructions (Addendum)
Please make follow up appointment in 1 month.  Please check blood sugar and bing meter to next visit.  In the next month I will : Cut back on sweets by eating more fruits and walk more to decrease weight and blood sugars.

## 2012-07-19 ENCOUNTER — Other Ambulatory Visit: Payer: Medicaid Other

## 2012-07-19 DIAGNOSIS — B20 Human immunodeficiency virus [HIV] disease: Secondary | ICD-10-CM

## 2012-07-19 DIAGNOSIS — E119 Type 2 diabetes mellitus without complications: Secondary | ICD-10-CM

## 2012-07-19 LAB — CBC WITH DIFFERENTIAL/PLATELET
Basophils Absolute: 0 10*3/uL (ref 0.0–0.1)
Eosinophils Absolute: 0 10*3/uL (ref 0.0–0.7)
Eosinophils Relative: 1 % (ref 0–5)
HCT: 36.1 % (ref 36.0–46.0)
MCH: 26.9 pg (ref 26.0–34.0)
MCHC: 33.8 g/dL (ref 30.0–36.0)
MCV: 79.7 fL (ref 78.0–100.0)
Monocytes Absolute: 0.2 10*3/uL (ref 0.1–1.0)
Platelets: 170 10*3/uL (ref 150–400)
RDW: 14.1 % (ref 11.5–15.5)

## 2012-07-19 LAB — COMPLETE METABOLIC PANEL WITH GFR
ALT: 32 U/L (ref 0–35)
AST: 32 U/L (ref 0–37)
BUN: 9 mg/dL (ref 6–23)
CO2: 25 mEq/L (ref 19–32)
Calcium: 9.3 mg/dL (ref 8.4–10.5)
Chloride: 104 mEq/L (ref 96–112)
Creat: 0.71 mg/dL (ref 0.50–1.10)
GFR, Est African American: >89 mL/min
Total Bilirubin: 0.4 mg/dL (ref 0.3–1.2)

## 2012-07-19 LAB — HEMOGLOBIN A1C
Hgb A1c MFr Bld: 6.5 % — ABNORMAL HIGH (ref <5.7)
Mean Plasma Glucose: 140 mg/dL — ABNORMAL HIGH (ref <117)

## 2012-07-20 LAB — T-HELPER CELL (CD4) - (RCID CLINIC ONLY): CD4 T Cell Abs: 540 uL (ref 400–2700)

## 2012-07-25 ENCOUNTER — Encounter: Payer: Self-pay | Admitting: *Deleted

## 2012-08-02 ENCOUNTER — Ambulatory Visit: Payer: Medicaid Other | Admitting: Internal Medicine

## 2012-08-04 ENCOUNTER — Encounter: Payer: Self-pay | Admitting: Internal Medicine

## 2012-08-04 ENCOUNTER — Ambulatory Visit (INDEPENDENT_AMBULATORY_CARE_PROVIDER_SITE_OTHER): Payer: Medicaid Other | Admitting: Internal Medicine

## 2012-08-04 VITALS — BP 110/73 | HR 94 | Temp 98.2°F | Wt 273.0 lb

## 2012-08-04 DIAGNOSIS — B20 Human immunodeficiency virus [HIV] disease: Secondary | ICD-10-CM

## 2012-08-04 DIAGNOSIS — Z113 Encounter for screening for infections with a predominantly sexual mode of transmission: Secondary | ICD-10-CM

## 2012-08-04 NOTE — Progress Notes (Signed)
  Subjective:    Patient ID: Beverly Johnson, female    DOB: 1975-12-05, 36 y.o.   MRN: [id number]  HPI She is here for followup of her HIV. She had previously stopped her medication for no reason however again in the spring started. She continues on Atripla initially in July was again undetectable as she is at this appointment. Her CD4 count also is stable. She has been established with a primary care physician for her diabetes. She does not require any medication at this time and she is following her blood sugars at home. She is unsure when her next appointment is with them. She has no complaints the   Review of Systems  Constitutional: Negative for fatigue and unexpected weight change.  HENT: Negative for trouble swallowing.   Respiratory: Negative for cough and shortness of breath.   Cardiovascular: Negative for leg swelling.  Gastrointestinal: Negative for nausea, abdominal pain and diarrhea.  Neurological: Negative for dizziness and headaches.       Objective:   Physical Exam  Constitutional: She appears well-developed and well-nourished. No distress.  HENT:  Mouth/Throat: Oropharynx is clear and moist. No oropharyngeal exudate.  Cardiovascular: Normal rate, regular rhythm and normal heart sounds.  Exam reveals no gallop and no friction rub.   No murmur heard. Pulmonary/Chest: Effort normal and breath sounds normal. No respiratory distress. She has no wheezes. She has no rales.          Assessment & Plan:

## 2012-08-04 NOTE — Assessment & Plan Note (Addendum)
She continues to do well and denies any missed doses. Encourage continued compliance. She knows to use condoms for sexual activity. She was encouraged followup with her primary physician and continue monitoring her blood sugars.  She does not currently have a followup appointment scheduled and told me it she is waiting for the clinic to call her

## 2012-11-07 ENCOUNTER — Encounter: Payer: Self-pay | Admitting: Internal Medicine

## 2012-11-07 ENCOUNTER — Emergency Department (HOSPITAL_COMMUNITY): Admission: EM | Admit: 2012-11-07 | Discharge: 2012-11-07 | Payer: Medicaid Other | Source: Home / Self Care

## 2012-11-07 ENCOUNTER — Ambulatory Visit (INDEPENDENT_AMBULATORY_CARE_PROVIDER_SITE_OTHER): Payer: Medicaid Other | Admitting: Internal Medicine

## 2012-11-07 VITALS — BP 129/90 | HR 94 | Temp 97.8°F | Wt 280.7 lb

## 2012-11-07 DIAGNOSIS — N949 Unspecified condition associated with female genital organs and menstrual cycle: Secondary | ICD-10-CM | POA: Insufficient documentation

## 2012-11-07 DIAGNOSIS — N9489 Other specified conditions associated with female genital organs and menstrual cycle: Secondary | ICD-10-CM

## 2012-11-07 MED ORDER — AZITHROMYCIN 500 MG PO TABS: 1000.0000 mg | ORAL_TABLET | Freq: Every day | ORAL | Status: DC

## 2012-11-07 NOTE — Progress Notes (Signed)
Patient ID: Beverly Johnson, female   DOB: 03/12/1976, 37 y.o.   MRN: [id number]  Subjective:   Patient ID: Beverly Johnson female   DOB: Aug 14, 1976 37 y.o.   MRN: [id number]  HPI: Beverly Johnson is a 38 y.o. female with history of T2DM (HbA1c 6.5) and HIV (last CD4 540, VL undetectable 07/2012) presenting to the clinic with complaints of a painful swelling over her L labia.  Patient reports 4 days of swelling and pain over L labia. Says pain occurs mostly with manipulation and when urine irritates skin. Denies any drainage from area.  Denies any vaginal discharge, abdominal pain, fever, chills, N/V/D, groin swelling. LMP 10/16/12. Sexually active with one partner, husband. Does not use condoms, both partners HIV +. Tubal ligation. Has history of genital herpes in the past. Says that this is very different today.   Past Medical History  Diagnosis Date  . Gallstones 03/10/2011  . HIV infection   . Diabetes mellitus without complication    Current Outpatient Prescriptions  Medication Sig Dispense Refill  . ACCU-CHEK FASTCLIX LANCETS MISC 1 each by Does not apply route 2 (two) times daily after a meal. Dx code 250.00  102 each  5  . azithromycin (ZITHROMAX) 500 MG tablet Take 2 tablets (1,000 mg total) by mouth daily.  2 tablet  0  . Blood Glucose Monitoring Suppl (ACCU-CHEK NANO SMARTVIEW) W/DEVICE KIT 1 each by Does not apply route 2 (two) times daily.  1 kit  0  . efavirenz-emtrictabine-tenofovir (ATRIPLA) 600-200-300 MG per tablet Take 1 tablet by mouth at bedtime.        Marland Kitchen glucose blood (ACCU-CHEK SMARTVIEW) test strip Use to check blood sugar twice a day after meals, dx code 250.00  100 each  6   No current facility-administered medications for this visit.   No family history on file. History   Social History  . Marital Status: Married    Spouse Name: N/A    Number of Children: N/A  . Years of Education: N/A   Social History Main Topics  . Smoking status: Never Smoker   .  Smokeless tobacco: Never Used  . Alcohol Use: No  . Drug Use: No  . Sexually Active: None     Comment: pt. declined condoms   Other Topics Concern  . None   Social History Narrative  . None   Review of Systems: 10 pt ROS performed, pertinent positives and negatives noted in HPI Objective:  Physical Exam: Filed Vitals:   11/07/12 1525  BP: 129/90  Pulse: 94  Temp: 97.8 F (36.6 C)  TempSrc: Oral  Weight: 280 lb 11.2 oz (127.325 kg)  SpO2: 99%   Vitals reviewed. General: sitting in bed Cardiac: RRR, no rubs, murmurs or gallops Pulm: clear to auscultation bilaterally, no wheezes, rales, or rhonchi Abd: soft, nontender, nondistended, BS present Groin: No inguinal LAD GU: Examination of L external labia reveals edema, small area of induration (1x1cm) with overlying skin erosion and erythema. No visible ulcer. No fluctuance. No vesicles. TTP. Neuro: alert and oriented X3, cranial nerves II-XII grossly intact, strength and sensation to light touch equal in bilateral upper and lower extremities  Assessment & Plan:   Please see problem-based charting for assessment and plan.

## 2012-11-07 NOTE — Assessment & Plan Note (Addendum)
Patient with edema and small area of induration over L labia with some superficial skin irritation and erosion. No vesicles or ulcer bases. Area is very TTP. No fluctuance or obvious abscess to drain today. Patient without systemic sx.  Solitary lesion which is painful, clinically not consistent with genital herpes and patient reports not similar to prior outbreaks. Possibly chancroid vs mild cellulitis. Do not suspect syphilitic chancre as lesion very painful. Patient refused IM ceftriaxone but agreed to po azithromycin. This should be adequate treatment for chancroid. Instructed her to RTC if worsening sx or lesion enlarges. May need drainage or further antibiotics (ie bactrim) if sx do not improve. She was instructed to take naproxen 500mg  BID to help with pain/swelling. Will check RPR.  Case discussed w Dr. Eben Burow.    ADDENDUM 11/09/2012  Called patient, she says that she is doing much better with resolution of her symptoms. Her RPR was negative. Notified her of negative lab results and instructed her to call back if symptoms return. She is in agreement.

## 2012-11-07 NOTE — Patient Instructions (Signed)
1. Please take azithromycin 2 pills once today.  2. You can take naproxen 500mg  twice a day to help with pain and swelling. 3. Please come back in 1 week if your symptoms are not better. I will call you if your lab results are not normal.

## 2013-01-31 ENCOUNTER — Other Ambulatory Visit (INDEPENDENT_AMBULATORY_CARE_PROVIDER_SITE_OTHER): Payer: Medicaid Other

## 2013-01-31 DIAGNOSIS — B20 Human immunodeficiency virus [HIV] disease: Secondary | ICD-10-CM

## 2013-02-01 LAB — T-HELPER CELL (CD4) - (RCID CLINIC ONLY): CD4 T Cell Abs: 610 uL (ref 400–2700)

## 2013-02-02 LAB — HIV-1 RNA QUANT-NO REFLEX-BLD
HIV 1 RNA Quant: 30 copies/mL — ABNORMAL HIGH (ref <20)
HIV-1 RNA Quant, Log: 1.48 {Log} — ABNORMAL HIGH (ref <1.30)

## 2013-02-14 ENCOUNTER — Ambulatory Visit: Payer: Medicaid Other | Admitting: Internal Medicine

## 2013-02-16 ENCOUNTER — Ambulatory Visit: Payer: Medicaid Other | Admitting: Internal Medicine

## 2013-02-21 ENCOUNTER — Telehealth: Payer: Self-pay | Admitting: *Deleted

## 2013-02-21 NOTE — Telephone Encounter (Signed)
Made appt.  Pt verbalized understanding.

## 2013-02-23 ENCOUNTER — Other Ambulatory Visit: Payer: Self-pay

## 2013-03-07 ENCOUNTER — Encounter: Payer: Medicaid Other | Admitting: Internal Medicine

## 2013-03-20 ENCOUNTER — Ambulatory Visit: Payer: Medicaid Other

## 2013-06-08 ENCOUNTER — Other Ambulatory Visit (HOSPITAL_COMMUNITY)
Admission: RE | Admit: 2013-06-08 | Discharge: 2013-06-08 | Disposition: A | Discharge status: Home / Self Care | Payer: Medicaid Other | Source: Ambulatory Visit | Attending: Infectious Diseases | Admitting: Infectious Diseases

## 2013-06-08 ENCOUNTER — Other Ambulatory Visit: Payer: Medicaid Other

## 2013-06-08 DIAGNOSIS — Z79899 Other long term (current) drug therapy: Secondary | ICD-10-CM

## 2013-06-08 DIAGNOSIS — Z113 Encounter for screening for infections with a predominantly sexual mode of transmission: Secondary | ICD-10-CM | POA: Insufficient documentation

## 2013-06-08 DIAGNOSIS — B20 Human immunodeficiency virus [HIV] disease: Secondary | ICD-10-CM

## 2013-06-08 LAB — COMPREHENSIVE METABOLIC PANEL
ALT: 46 U/L — ABNORMAL HIGH (ref 0–35)
Alkaline Phosphatase: 104 U/L (ref 39–117)
CO2: 26 mEq/L (ref 19–32)
Calcium: 9.2 mg/dL (ref 8.4–10.5)
Chloride: 105 mEq/L (ref 96–112)
Sodium: 137 mEq/L (ref 135–145)
Total Bilirubin: 0.3 mg/dL (ref 0.3–1.2)
Total Protein: 7.8 g/dL (ref 6.0–8.3)

## 2013-06-08 LAB — CBC WITH DIFFERENTIAL/PLATELET
Basophils Relative: 0 % (ref 0–1)
Eosinophils Absolute: 0 10*3/uL (ref 0.0–0.7)
Lymphocytes Relative: 47 % — ABNORMAL HIGH (ref 12–46)
Lymphs Abs: 1.6 10*3/uL (ref 0.7–4.0)
MCH: 26.7 pg (ref 26.0–34.0)
Neutrophils Relative %: 45 % (ref 43–77)
Platelets: 151 10*3/uL (ref 150–400)
RBC: 4.39 MIL/uL (ref 3.87–5.11)
WBC: 3.3 10*3/uL — ABNORMAL LOW (ref 4.0–10.5)

## 2013-06-08 LAB — LIPID PANEL
Cholesterol: 114 mg/dL (ref 0–200)
HDL: 43 mg/dL (ref >39)
LDL Cholesterol: 51 mg/dL (ref 0–99)
Total CHOL/HDL Ratio: 2.7 Ratio
VLDL: 20 mg/dL (ref 0–40)

## 2013-06-09 LAB — HIV-1 RNA QUANT-NO REFLEX-BLD: HIV 1 RNA Quant: 26 copies/mL — ABNORMAL HIGH (ref <20)

## 2013-06-19 ENCOUNTER — Ambulatory Visit: Payer: Medicaid Other

## 2013-06-22 ENCOUNTER — Other Ambulatory Visit: Payer: Self-pay

## 2013-06-22 ENCOUNTER — Ambulatory Visit: Payer: Medicaid Other | Admitting: Internal Medicine

## 2013-06-22 ENCOUNTER — Telehealth: Payer: Self-pay | Admitting: *Deleted

## 2013-06-22 NOTE — Telephone Encounter (Signed)
Called patient and left voice mail explaining our no show policy. Today was her 3rd no show. Wendall Mola

## 2013-06-28 ENCOUNTER — Emergency Department (HOSPITAL_COMMUNITY)
Admission: EM | Admit: 2013-06-28 | Discharge: 2013-06-28 | Disposition: A | Discharge status: Home / Self Care | Payer: Medicaid Other | Attending: Emergency Medicine | Admitting: Emergency Medicine

## 2013-06-28 ENCOUNTER — Encounter (HOSPITAL_COMMUNITY): Payer: Self-pay | Admitting: Emergency Medicine

## 2013-06-28 DIAGNOSIS — Y9389 Activity, other specified: Secondary | ICD-10-CM | POA: Insufficient documentation

## 2013-06-28 DIAGNOSIS — IMO0002 Reserved for concepts with insufficient information to code with codable children: Secondary | ICD-10-CM | POA: Insufficient documentation

## 2013-06-28 DIAGNOSIS — Z21 Asymptomatic human immunodeficiency virus [HIV] infection status: Secondary | ICD-10-CM | POA: Insufficient documentation

## 2013-06-28 DIAGNOSIS — Y92410 Unspecified street and highway as the place of occurrence of the external cause: Secondary | ICD-10-CM | POA: Insufficient documentation

## 2013-06-28 DIAGNOSIS — Z8719 Personal history of other diseases of the digestive system: Secondary | ICD-10-CM | POA: Insufficient documentation

## 2013-06-28 DIAGNOSIS — E119 Type 2 diabetes mellitus without complications: Secondary | ICD-10-CM | POA: Insufficient documentation

## 2013-06-28 MED ORDER — HYDROCODONE-ACETAMINOPHEN 5-325 MG PO TABS: 1.0000 | ORAL_TABLET | ORAL | Status: DC | PRN
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
## 2013-06-30 NOTE — ED Provider Notes (Signed)
Medical screening examination/treatment/procedure(s) were performed by non-physician practitioner and as supervising physician I was immediately available for consultation/collaboration.     Tysheem Accardo, MD 06/30/13 0714 

## 2013-07-27 ENCOUNTER — Ambulatory Visit (INDEPENDENT_AMBULATORY_CARE_PROVIDER_SITE_OTHER): Payer: Medicaid Other | Admitting: Internal Medicine

## 2013-07-27 ENCOUNTER — Telehealth: Payer: Self-pay | Admitting: *Deleted

## 2013-07-27 ENCOUNTER — Encounter: Payer: Self-pay | Admitting: Internal Medicine

## 2013-07-27 VITALS — BP 114/76 | HR 88 | Temp 98.3°F | Ht 67.0 in | Wt 272.0 lb

## 2013-07-27 DIAGNOSIS — B20 Human immunodeficiency virus [HIV] disease: Secondary | ICD-10-CM

## 2013-07-27 MED ORDER — EFAVIRENZ-EMTRICITAB-TENOFOVIR 600-200-300 MG PO TABS: 1.0000 | ORAL_TABLET | Freq: Every day | ORAL | Status: DC

## 2013-07-27 NOTE — Telephone Encounter (Signed)
Patient called requesting refills, states she hasn't needed a prescription in 3 years.  Patient came in for labs 05/2013, but hasn't been seen by a doctor in 1 year.  Pt has 3+ no-shows within 1 year.  Patient informed about walk-in clinic, advised that her doctor is here all day today.  Pt verbalized understanding that she wouldn't be given an appointment, stated she would try to be here today between 2 and 4 to hopefully be worked into Dr. Ephriam Knuckles clinic. Andree Coss, RN

## 2013-07-28 NOTE — Assessment & Plan Note (Signed)
Continue with Atripla. Reminded she needs q 6 month follow up to be sure her medication is effective.

## 2013-07-28 NOTE — Progress Notes (Signed)
   Subjective:    Patient ID: Beverly Johnson, female    DOB: 1975/09/02, 37 y.o.   MRN: [id number]  HPI She comes in for a walkin visit.  She has a long history of HIV and has been well controlled on Atripla.  She has not been seen in over 1 year and does not feel she needs to come in for appts.  She has no complaints today, though answers with yes or no and does not engage in conversation.  Denies weight loss, diarrhea.     Review of Systems  Constitutional: Negative for fatigue.  Gastrointestinal: Negative for abdominal pain and diarrhea.  Neurological: Negative for dizziness and light-headedness.  Psychiatric/Behavioral: Negative for sleep disturbance and dysphoric mood.       Objective:   Physical Exam  Constitutional: She appears well-developed and well-nourished. No distress.  HENT:  Mouth/Throat: No oropharyngeal exudate.  Eyes: Right eye exhibits no discharge. Left eye exhibits no discharge. No scleral icterus.  Cardiovascular: Normal rate, regular rhythm and normal heart sounds.   No murmur heard. Pulmonary/Chest: Effort normal. No respiratory distress.  Lymphadenopathy:    She has no cervical adenopathy.  Skin: No rash noted.          Assessment & Plan:
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
## 2013-08-08 ENCOUNTER — Telehealth: Payer: Self-pay | Admitting: *Deleted

## 2013-08-08 NOTE — Telephone Encounter (Signed)
Requested pt call [facility] for PAP smear appt.

## 2013-09-14 ENCOUNTER — Telehealth: Payer: Self-pay | Admitting: Licensed Clinical Social Worker

## 2013-09-14 NOTE — Telephone Encounter (Signed)
Patient complaining of upper abdominal pain under her breasts for 2 days. She states that she was told her liver enzymes were high and wanted to be seen today. She denies nausea or vomiting, fever or chills. Patient was very emotional, I advised her to go to the emergency room due to her symptoms and we did not have the availability to see her this morning. Patient agreed.

## 2013-09-19 ENCOUNTER — Encounter: Payer: Medicaid Other | Admitting: Internal Medicine

## 2013-11-23 ENCOUNTER — Other Ambulatory Visit: Payer: Self-pay

## 2014-01-12 ENCOUNTER — Other Ambulatory Visit: Payer: Self-pay | Admitting: Internal Medicine

## 2014-01-12 DIAGNOSIS — B20 Human immunodeficiency virus [HIV] disease: Secondary | ICD-10-CM

## 2014-02-20 ENCOUNTER — Encounter: Payer: Medicaid Other | Admitting: Internal Medicine

## 2014-02-27 ENCOUNTER — Ambulatory Visit (INDEPENDENT_AMBULATORY_CARE_PROVIDER_SITE_OTHER): Payer: Medicaid Other | Admitting: Internal Medicine

## 2014-02-27 ENCOUNTER — Encounter: Payer: Self-pay | Admitting: Internal Medicine

## 2014-02-27 VITALS — BP 121/86 | HR 74 | Temp 98.0°F | Ht 66.0 in | Wt 274.2 lb

## 2014-02-27 DIAGNOSIS — Z0000 Encounter for general adult medical examination without abnormal findings: Secondary | ICD-10-CM

## 2014-02-27 DIAGNOSIS — E119 Type 2 diabetes mellitus without complications: Secondary | ICD-10-CM

## 2014-02-27 LAB — GLUCOSE, CAPILLARY: Glucose-Capillary: 115 mg/dL — ABNORMAL HIGH (ref 70–99)

## 2014-02-27 LAB — POCT GLYCOSYLATED HEMOGLOBIN (HGB A1C): HEMOGLOBIN A1C: 6.6

## 2014-02-27 MED ORDER — METFORMIN HCL 500 MG PO TABS: 500.0000 mg | ORAL_TABLET | Freq: Two times a day (BID) | ORAL | Status: DC

## 2014-02-27 NOTE — Patient Instructions (Signed)
Please send me your records from the papsmear  Please get your eye exam done and send [place] the results as well.   Please reconsider the pneumonia vaccine  We started metformin today 572m by mouth twice a day. You may have some GI side effects in the beginning.   General Instructions:   Please bring your medicines with you each time you come to clinic.  Medicines may include prescription medications, over-the-counter medications, herbal remedies, eye drops, vitamins, or other pills.   Progress Toward Treatment Goals:  No flowsheet data found.  Self Care Goals & Plans:  Self Care Goal 02/27/2014  Monitor my health keep track of my blood glucose; check my feet daily  Be physically active find an activity I enjoy    No flowsheet data found.   Care Management & Community Referrals:  No flowsheet data found.    Metformin tablets What is this medicine? METFORMIN (met FOR min) is used to treat type 2 diabetes. It helps to control blood sugar. Treatment is combined with diet and exercise. This medicine can be used alone or with other medicines for diabetes. This medicine may be used for other purposes; ask your health care provider or pharmacist if you have questions. COMMON BRAND NAME(S): Glucophage What should I tell my health care provider before I take this medicine? They need to know if you have any of these conditions: -anemia -frequently drink alcohol-containing beverages -become easily dehydrated -heart attack -heart failure that is treated with medications -kidney disease -liver disease -polycystic ovary syndrome -serious infection or injury -vomiting -an unusual or allergic reaction to metformin, other medicines, foods, dyes, or preservatives -pregnant or trying to get pregnant -breast-feeding How should I use this medicine? Take this medicine by mouth. Take it with meals. Swallow the tablets with a drink of water. Follow the directions on the prescription label.  Take your medicine at regular intervals. Do not take your medicine more often than directed. Talk to your pediatrician regarding the use of this medicine in children. While this drug may be prescribed for children as young as 166years of age for selected conditions, precautions do apply. Overdosage: If you think you have taken too much of this medicine contact a poison control center or emergency room at once. NOTE: This medicine is only for you. Do not share this medicine with others. What if I miss a dose? If you miss a dose, take it as soon as you can. If it is almost time for your next dose, take only that dose. Do not take double or extra doses. What may interact with this medicine? Do not take this medicine with any of the following medications: -dofetilide -gatifloxacin -certain contrast medicines given before X-rays, CT scans, MRI, or other procedures This medicine may also interact with the following medications: -digoxin -diuretics -female hormones, like estrogens or progestins and birth control pills -isoniazid -medicines for blood pressure, heart disease, irregular heart beat -morphine -nicotinic acid -phenothiazines like chlorpromazine, mesoridazine, prochlorperazine, thioridazine -phenytoin -procainamide -quinidine -quinine -ranitidine -steroid medicines like prednisone or cortisone -stimulant medicines for attention disorders, weight loss, or to stay awake -thyroid medicines -trimethoprim -vancomycin This list may not describe all possible interactions. Give your health care provider a list of all the medicines, herbs, non-prescription drugs, or dietary supplements you use. Also tell them if you smoke, drink alcohol, or use illegal drugs. Some items may interact with your medicine. What should I watch for while using this medicine? Visit your doctor or health care  professional for regular checks on your progress. A test called the HbA1C (A1C) will be monitored. This is  a simple blood test. It measures your blood sugar control over the last 2 to 3 months. You will receive this test every 3 to 6 months. Learn how to check your blood sugar. Learn the symptoms of low and high blood sugar and how to manage them. Always carry a quick-source of sugar with you in case you have symptoms of low blood sugar. Examples include hard sugar candy or glucose tablets. Make sure others know that you can choke if you eat or drink when you develop serious symptoms of low blood sugar, such as seizures or unconsciousness. They must get medical help at once. Tell your doctor or health care professional if you have high blood sugar. You might need to change the dose of your medicine. If you are sick or exercising more than usual, you might need to change the dose of your medicine. Do not skip meals. Ask your doctor or health care professional if you should avoid alcohol. Many nonprescription cough and cold products contain sugar or alcohol. These can affect blood sugar. This medicine may cause ovulation in premenopausal women who do not have regular monthly periods. This may increase your chances of becoming pregnant. You should not take this medicine if you become pregnant or think you may be pregnant. Talk with your doctor or health care professional about your birth control options while taking this medicine. Contact your doctor or health care professional right away if think you are pregnant. If you are going to need surgery, a MRI, CT scan, or other procedure, tell your doctor that you are taking this medicine. You may need to stop taking this medicine before the procedure. Wear a medical ID bracelet or chain, and carry a card that describes your disease and details of your medicine and dosage times. What side effects may I notice from receiving this medicine? Side effects that you should report to your doctor or health care professional as soon as possible: -allergic reactions like skin  rash, itching or hives, swelling of the face, lips, or tongue -breathing problems -feeling faint or lightheaded, falls -muscle aches or pains -signs and symptoms of low blood sugar such as feeling anxious, confusion, dizziness, increased hunger, unusually weak or tired, sweating, shakiness, cold, irritable, headache, blurred vision, fast heartbeat, loss of consciousness -slow or irregular heartbeat -unusual stomach pain or discomfort -unusually tired or weak Side effects that usually do not require medical attention (report to your doctor or health care professional if they continue or are bothersome): -diarrhea -headache -heartburn -metallic taste in mouth -nausea -stomach gas, upset This list may not describe all possible side effects. Call your doctor for medical advice about side effects. You may report side effects to FDA at 1-800-FDA-1088. Where should I keep my medicine? Keep out of the reach of children. Store at room temperature between 15 and 30 degrees C (59 and 86 degrees F). Protect from moisture and light. Throw away any unused medicine after the expiration date. NOTE: This sheet is a summary. It may not cover all possible information. If you have questions about this medicine, talk to your doctor, pharmacist, or health care provider.  2015, [employer]vier/[employer] Standard. (2012-11-15 16:03:44)

## 2014-02-28 LAB — MICROALBUMIN / CREATININE URINE RATIO
CREATININE, URINE: 404.7 mg/dL
MICROALB UR: 0.84 mg/dL (ref 0.00–1.89)
Microalb Creat Ratio: 2.1 mg/g (ref 0.0–30.0)

## 2014-03-02 DIAGNOSIS — Z0000 Encounter for general adult medical examination without abnormal findings: Secondary | ICD-10-CM | POA: Insufficient documentation

## 2014-03-02 NOTE — Assessment & Plan Note (Signed)
Lab Results  Component Value Date   HGBA1C 6.6 02/27/2014   HGBA1C 6.5* 07/19/2012   HGBA1C 6.5 04/27/2012    Assessment: Diabetes control: good control (HgbA1C at goal) Progress toward A1C goal:  at goal Comments: wants to start metformin  Plan: Medications:  started low dose metformin 500mg  bid Home glucose monitoring: Frequency:   Timing:   Instruction/counseling given: reminded to get eye exam, reminded to bring medications to each visit, discussed the need for weight loss and discussed diet Educational resources provided:   Self management tools provided:   Other plans: foot exam done today

## 2014-03-02 NOTE — Progress Notes (Signed)
   Subjective:   Patient ID: Beverly Johnson female   DOB: 1975-08-21 38 y.o.   MRN: [id number]  HPI: [name] is a 38 y.o. female with HIV and well controlled DM2 presenting to opc today for routine follow up visit.   HIV--follows with ID clinic. On Atripla. Does not use condoms with husband who also has HIV. I counseled her again today on using protection.   DM2--A1C checked today, 6.6. Foot exam done. Could not do eye exam in opc today due to availability but she is asked to return to her prior eye exam provider. She wishes to start metformin today as she says her diet is not well controlled and it may help with her weight loss and keep her diabetes in check.   Health maintenance--refuses pneumococcal vaccine, does not like injections but has tattoos. Reports recently having a papsmear as part of a study protocol she is on, says she will send records. 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 No genital warts.   Past Medical History  Diagnosis Date  . Gallstones 03/10/2011  . HIV infection   . Diabetes mellitus without complication    Current Outpatient Prescriptions  Medication Sig Dispense Refill  . ATRIPLA 600-200-300 MG per tablet TAKE 1 TABLET BY MOUTH AT BEDTIME.  30 tablet  0  . metFORMIN (GLUCOPHAGE) 500 MG tablet Take 1 tablet (500 mg total) by mouth 2 (two) times daily with a meal.  60 tablet  3   No current facility-administered medications for this visit.   No family history on file. History   Social History  . Marital Status: Married    Spouse Name: N/A    Number of Children: N/A  . Years of Education: N/A   Social History Main Topics  . Smoking status: Never Smoker   . Smokeless tobacco: Never Used  . Alcohol Use: No  . Drug Use: No  . Sexual Activity: None     Comment: pt. declined condoms   Other Topics Concern  . None   Social History Narrative  . None   Review of Systems:  Constitutional:  Denies fever, chills  HEENT:  Denies congestion  Respiratory:   Denies SOB  Cardiovascular:  Denies chest pain  Gastrointestinal:  Denies nausea, vomiting, abdominal pain  Genitourinary:  Denies dysuria  Musculoskeletal:  Denies myalgias  Skin:  Denies pallor, rash and wound.   Neurological:  Denies headaches   Objective:  Physical Exam: Filed Vitals:   02/27/14 1456  BP: 121/86  Pulse: 74  Temp: 98 F (36.7 C)  TempSrc: Oral  Height: 5\' 6"  (1.676 m)  Weight: 274 lb 3.2 oz (124.376 kg)  SpO2: 98%   Vitals reviewed. General: sitting in chair, NAD HEENT: EOMI Cardiac: RRR Pulm: clear to auscultation bilaterally, no wheezes, rales, or rhonchi Abd: soft, nontender, obese, BS present Ext: warm and well perfused, +2DP B/L Neuro: alert and oriented X3, cranial nerves II-XII grossly intact, strength and sensation to light touch equal in bilateral upper and lower extremities  Assessment & Plan:  Discussed with Dr. Dareen Piano Started low dose metformin Needs eye exam

## 2014-03-02 NOTE — Progress Notes (Signed)
INTERNAL MEDICINE TEACHING ATTENDING ADDENDUM - Deandrae Wajda, MD: I reviewed and discussed at the time of visit with the resident Dr. Qureshi, the patient's medical history, physical examination, diagnosis and results of pertinent tests and treatment and I agree with the patient's care as documented.  

## 2014-03-02 NOTE — Assessment & Plan Note (Addendum)
Refuses vaccination.   Foot exam and a1c done today. Unable to do eye exam in opc today due to availability. Recommended to return to [facility] if cannot be seen in opc.  Reports having recent papsmear done and being told she has hpv. Need records she will get them faxed to [place].  Microalbumin done

## 2014-03-09 ENCOUNTER — Ambulatory Visit: Payer: Medicaid Other

## 2014-05-16 ENCOUNTER — Encounter: Payer: Self-pay | Admitting: *Deleted

## 2014-05-16 ENCOUNTER — Telehealth: Payer: Self-pay | Admitting: *Deleted

## 2014-05-16 NOTE — Telephone Encounter (Signed)
Needing MD/Lab/Pap smear appts - will send letter.

## 2014-05-31 ENCOUNTER — Other Ambulatory Visit: Payer: Medicaid Other

## 2014-05-31 DIAGNOSIS — B20 Human immunodeficiency virus [HIV] disease: Secondary | ICD-10-CM

## 2014-05-31 LAB — COMPLETE METABOLIC PANEL WITH GFR
ALT: 19 U/L (ref 0–35)
AST: 27 U/L (ref 0–37)
Albumin: 3.8 g/dL (ref 3.5–5.2)
Alkaline Phosphatase: 87 U/L (ref 39–117)
BILIRUBIN TOTAL: 0.4 mg/dL (ref 0.2–1.2)
BUN: 4 mg/dL — ABNORMAL LOW (ref 6–23)
CHLORIDE: 105 meq/L (ref 96–112)
CO2: 24 mEq/L (ref 19–32)
Calcium: 9.4 mg/dL (ref 8.4–10.5)
Creat: 0.73 mg/dL (ref 0.50–1.10)
GLUCOSE: 101 mg/dL — AB (ref 70–99)
Potassium: 3.7 mEq/L (ref 3.5–5.3)
Sodium: 138 mEq/L (ref 135–145)
Total Protein: 8.4 g/dL — ABNORMAL HIGH (ref 6.0–8.3)

## 2014-06-01 LAB — T-HELPER CELL (CD4) - (RCID CLINIC ONLY)
CD4 % Helper T Cell: 35 % (ref 33–55)
CD4 T Cell Abs: 630 /uL (ref 400–2700)

## 2014-06-04 LAB — HIV-1 RNA QUANT-NO REFLEX-BLD
HIV 1 RNA Quant: 160 copies/mL — ABNORMAL HIGH (ref <20)
HIV-1 RNA Quant, Log: 2.2 {Log} — ABNORMAL HIGH (ref <1.30)

## 2014-06-28 ENCOUNTER — Ambulatory Visit: Payer: Medicaid Other | Admitting: Internal Medicine

## 2014-09-04 ENCOUNTER — Emergency Department (HOSPITAL_COMMUNITY): Payer: Medicaid Other

## 2014-09-04 ENCOUNTER — Encounter (HOSPITAL_COMMUNITY): Payer: Self-pay | Admitting: Physical Medicine and Rehabilitation

## 2014-09-04 ENCOUNTER — Emergency Department (HOSPITAL_COMMUNITY)
Admission: EM | Admit: 2014-09-04 | Discharge: 2014-09-04 | Disposition: A | Discharge status: Home / Self Care | Payer: Medicaid Other | Attending: Emergency Medicine | Admitting: Emergency Medicine

## 2014-09-04 DIAGNOSIS — E669 Obesity, unspecified: Secondary | ICD-10-CM | POA: Diagnosis not present

## 2014-09-04 DIAGNOSIS — Z79899 Other long term (current) drug therapy: Secondary | ICD-10-CM | POA: Insufficient documentation

## 2014-09-04 DIAGNOSIS — R21 Rash and other nonspecific skin eruption: Secondary | ICD-10-CM

## 2014-09-04 DIAGNOSIS — R0602 Shortness of breath: Secondary | ICD-10-CM | POA: Diagnosis not present

## 2014-09-04 DIAGNOSIS — Z21 Asymptomatic human immunodeficiency virus [HIV] infection status: Secondary | ICD-10-CM | POA: Insufficient documentation

## 2014-09-04 DIAGNOSIS — Z862 Personal history of diseases of the blood and blood-forming organs and certain disorders involving the immune mechanism: Secondary | ICD-10-CM | POA: Diagnosis not present

## 2014-09-04 DIAGNOSIS — R1013 Epigastric pain: Secondary | ICD-10-CM | POA: Diagnosis not present

## 2014-09-04 DIAGNOSIS — Z8719 Personal history of other diseases of the digestive system: Secondary | ICD-10-CM | POA: Insufficient documentation

## 2014-09-04 DIAGNOSIS — E119 Type 2 diabetes mellitus without complications: Secondary | ICD-10-CM | POA: Insufficient documentation

## 2014-09-04 DIAGNOSIS — Z3202 Encounter for pregnancy test, result negative: Secondary | ICD-10-CM | POA: Diagnosis not present

## 2014-09-04 HISTORY — DX: Immunodeficiency, unspecified: D84.9

## 2014-09-04 LAB — COMPREHENSIVE METABOLIC PANEL
ALT: 16 U/L (ref 0–35)
AST: 28 U/L (ref 0–37)
Albumin: 2.9 g/dL — ABNORMAL LOW (ref 3.5–5.2)
Alkaline Phosphatase: 68 U/L (ref 39–117)
Anion gap: 3 — ABNORMAL LOW (ref 5–15)
BUN: 9 mg/dL (ref 6–23)
CO2: 23 mmol/L (ref 19–32)
Calcium: 8.6 mg/dL (ref 8.4–10.5)
Chloride: 110 mEq/L (ref 96–112)
Creatinine, Ser: 0.85 mg/dL (ref 0.50–1.10)
GFR calc Af Amer: >90 mL/min (ref >90)
GFR, EST NON AFRICAN AMERICAN: 87 mL/min — AB (ref >90)
GLUCOSE: 96 mg/dL (ref 70–99)
POTASSIUM: 4.1 mmol/L (ref 3.5–5.1)
SODIUM: 136 mmol/L (ref 135–145)
Total Bilirubin: 0.6 mg/dL (ref 0.3–1.2)
Total Protein: 7.7 g/dL (ref 6.0–8.3)

## 2014-09-04 LAB — CBC WITH DIFFERENTIAL/PLATELET
Basophils Absolute: 0 10*3/uL (ref 0.0–0.1)
Basophils Relative: 0 % (ref 0–1)
EOS ABS: 0.1 10*3/uL (ref 0.0–0.7)
Eosinophils Relative: 3 % (ref 0–5)
HCT: 29.9 % — ABNORMAL LOW (ref 36.0–46.0)
Hemoglobin: 9.4 g/dL — ABNORMAL LOW (ref 12.0–15.0)
LYMPHS ABS: 1.3 10*3/uL (ref 0.7–4.0)
Lymphocytes Relative: 45 % (ref 12–46)
MCH: 24.3 pg — ABNORMAL LOW (ref 26.0–34.0)
MCHC: 31.4 g/dL (ref 30.0–36.0)
MCV: 77.3 fL — ABNORMAL LOW (ref 78.0–100.0)
Monocytes Absolute: 0.2 10*3/uL (ref 0.1–1.0)
Monocytes Relative: 8 % (ref 3–12)
NEUTROS PCT: 44 % (ref 43–77)
Neutro Abs: 1.3 10*3/uL — ABNORMAL LOW (ref 1.7–7.7)
Platelets: 145 10*3/uL — ABNORMAL LOW (ref 150–400)
RBC: 3.87 MIL/uL (ref 3.87–5.11)
RDW: 13.1 % (ref 11.5–15.5)
WBC: 2.9 10*3/uL — AB (ref 4.0–10.5)

## 2014-09-04 LAB — URINALYSIS, ROUTINE W REFLEX MICROSCOPIC
Bilirubin Urine: NEGATIVE
GLUCOSE, UA: NEGATIVE mg/dL
Hgb urine dipstick: NEGATIVE
Ketones, ur: NEGATIVE mg/dL
LEUKOCYTES UA: NEGATIVE
NITRITE: NEGATIVE
PH: 5.5 (ref 5.0–8.0)
Protein, ur: NEGATIVE mg/dL
SPECIFIC GRAVITY, URINE: 1.021 (ref 1.005–1.030)
Urobilinogen, UA: 0.2 mg/dL (ref 0.0–1.0)

## 2014-09-04 LAB — POC URINE PREG, ED: Preg Test, Ur: NEGATIVE

## 2014-09-04 LAB — LIPASE, BLOOD: LIPASE: 44 U/L (ref 11–59)

## 2014-09-04 LAB — I-STAT TROPONIN, ED: TROPONIN I, POC: 0 ng/mL (ref 0.00–0.08)

## 2014-09-04 LAB — BRAIN NATRIURETIC PEPTIDE: B Natriuretic Peptide: 185.2 pg/mL — ABNORMAL HIGH (ref 0.0–100.0)

## 2014-09-04 MED ORDER — KETOROLAC TROMETHAMINE 30 MG/ML IJ SOLN
30.0000 mg | Freq: Once | INTRAMUSCULAR | Status: AC
  Administered 2014-09-04: 30 mg via INTRAMUSCULAR
  Filled 2014-09-04: qty 1

## 2014-09-04 MED ORDER — HYDROCORTISONE 1 % EX CREA: TOPICAL_CREAM | CUTANEOUS | Status: DC

## 2014-09-04 MED ORDER — KETOROLAC TROMETHAMINE 30 MG/ML IJ SOLN: 30.0000 mg | Freq: Once | INTRAMUSCULAR | Status: DC

## 2014-09-04 NOTE — Discharge Instructions (Signed)
Apply hydrogen cortisone cream twice daily as prescribed. Follow-up with your primary care physician.  Abdominal Pain Many things can cause abdominal pain. Usually, abdominal pain is not caused by a disease and will improve without treatment. It can often be observed and treated at home. Your health care provider will do a physical exam and possibly order blood tests and X-rays to help determine the seriousness of your pain. However, in many cases, more time must pass before a clear cause of the pain can be found. Before that point, your health care provider may not know if you need more testing or further treatment. HOME CARE INSTRUCTIONS  Monitor your abdominal pain for any changes. The following actions may help to alleviate any discomfort you are experiencing:  Only take over-the-counter or prescription medicines as directed by your health care provider.  Do not take laxatives unless directed to do so by your health care provider.  Try a clear liquid diet (broth, tea, or water) as directed by your health care provider. Slowly move to a bland diet as tolerated. SEEK MEDICAL CARE IF:  You have unexplained abdominal pain.  You have abdominal pain associated with nausea or diarrhea.  You have pain when you urinate or have a bowel movement.  You experience abdominal pain that wakes you in the night.  You have abdominal pain that is worsened or improved by eating food.  You have abdominal pain that is worsened with eating fatty foods.  You have a fever. SEEK IMMEDIATE MEDICAL CARE IF:   Your pain does not go away within 2 hours.  You keep throwing up (vomiting).  Your pain is felt only in portions of the abdomen, such as the right side or the left lower portion of the abdomen.  You pass bloody or black tarry stools. MAKE SURE YOU:  Understand these instructions.   Will watch your condition.   Will get help right away if you are not doing well or get worse.  Document  Released: 05/13/2005 Document Revised: 08/08/2013 Document Reviewed: 04/12/2013 [facility] Patient Information 2015 [place], [place]. This information is not intended to replace advice given to you by your health care provider. Make sure you discuss any questions you have with your health care provider.  Contact Dermatitis Contact dermatitis is a reaction to certain substances that touch the skin. Contact dermatitis can be either irritant contact dermatitis or allergic contact dermatitis. Irritant contact dermatitis does not require previous exposure to the substance for a reaction to occur.Allergic contact dermatitis only occurs if you have been exposed to the substance before. Upon a repeat exposure, your body reacts to the substance.  CAUSES  Many substances can cause contact dermatitis. Irritant dermatitis is most commonly caused by repeated exposure to mildly irritating substances, such as:  Makeup.  Soaps.  Detergents.  Bleaches.  Acids.  Metal salts, such as nickel. Allergic contact dermatitis is most commonly caused by exposure to:  Poisonous plants.  Chemicals (deodorants, shampoos).  Jewelry.  Latex.  Neomycin in triple antibiotic cream.  Preservatives in products, including clothing. SYMPTOMS  The area of skin that is exposed may develop:  Dryness or flaking.  Redness.  Cracks.  Itching.  Pain or a burning sensation.  Blisters. With allergic contact dermatitis, there may also be swelling in areas such as the eyelids, mouth, or genitals.  DIAGNOSIS  Your caregiver can usually tell what the problem is by doing a physical exam. In cases where the cause is uncertain and an allergic contact dermatitis  is suspected, a patch skin test may be performed to help determine the cause of your dermatitis. TREATMENT Treatment includes protecting the skin from further contact with the irritating substance by avoiding that substance if possible. Barrier creams, powders, and  gloves may be helpful. Your caregiver may also recommend:  Steroid creams or ointments applied 2 times daily. For best results, soak the rash area in cool water for 20 minutes. Then apply the medicine. Cover the area with a plastic wrap. You can store the steroid cream in the refrigerator for a "chilly" effect on your rash. That may decrease itching. Oral steroid medicines may be needed in more severe cases.  Antibiotics or antibacterial ointments if a skin infection is present.  Antihistamine lotion or an antihistamine taken by mouth to ease itching.  Lubricants to keep moisture in your skin.  Burow's solution to reduce redness and soreness or to dry a weeping rash. Mix one packet or tablet of solution in 2 cups cool water. Dip a clean washcloth in the mixture, wring it out a bit, and put it on the affected area. Leave the cloth in place for 30 minutes. Do this as often as possible throughout the day.  Taking several cornstarch or baking soda baths daily if the area is too large to cover with a washcloth. Harsh chemicals, such as alkalis or acids, can cause skin damage that is like a burn. You should flush your skin for 15 to 20 minutes with cold water after such an exposure. You should also seek immediate medical care after exposure. Bandages (dressings), antibiotics, and pain medicine may be needed for severely irritated skin.  HOME CARE INSTRUCTIONS  Avoid the substance that caused your reaction.  Keep the area of skin that is affected away from hot water, soap, sunlight, chemicals, acidic substances, or anything else that would irritate your skin.  Do not scratch the rash. Scratching may cause the rash to become infected.  You may take cool baths to help stop the itching.  Only take over-the-counter or prescription medicines as directed by your caregiver.  See your caregiver for follow-up care as directed to make sure your skin is healing properly. SEEK MEDICAL CARE IF:   Your  condition is not better after 3 days of treatment.  You seem to be getting worse.  You see signs of infection such as swelling, tenderness, redness, soreness, or warmth in the affected area.  You have any problems related to your medicines. Document Released: 07/31/2000 Document Revised: 10/26/2011 Document Reviewed: 01/06/2011 [facility] Patient Information 2015 [place], [place]. This information is not intended to replace advice given to you by your health care provider. Make sure you discuss any questions you have with your health care provider.  Rash A rash is a change in the color or texture of your skin. There are many different types of rashes. You may have other problems that accompany your rash. CAUSES   Infections.  Allergic reactions. This can include allergies to pets or foods.  Certain medicines.  Exposure to certain chemicals, soaps, or cosmetics.  Heat.  Exposure to poisonous plants.  Tumors, both cancerous and noncancerous. SYMPTOMS   Redness.  Scaly skin.  Itchy skin.  Dry or cracked skin.  Bumps.  Blisters.  Pain. DIAGNOSIS  Your caregiver may do a physical exam to determine what type of rash you have. A skin sample (biopsy) may be taken and examined under a microscope. TREATMENT  Treatment depends on the type of rash you have. Your  caregiver may prescribe certain medicines. For serious conditions, you may need to see a skin doctor (dermatologist). HOME CARE INSTRUCTIONS   Avoid the substance that caused your rash.  Do not scratch your rash. This can cause infection.  You may take cool baths to help stop itching.  Only take over-the-counter or prescription medicines as directed by your caregiver.  Keep all follow-up appointments as directed by your caregiver. SEEK IMMEDIATE MEDICAL CARE IF:  You have increasing pain, swelling, or redness.  You have a fever.  You have new or severe symptoms.  You have body aches, diarrhea, or  vomiting.  Your rash is not better after 3 days. MAKE SURE YOU:  Understand these instructions.  Will watch your condition.  Will get help right away if you are not doing well or get worse. Document Released: 07/24/2002 Document Revised: 10/26/2011 Document Reviewed: 05/18/2011 [facility] Patient Information 2015 [place], [place]. This information is not intended to replace advice given to you by your health care provider. Make sure you discuss any questions you have with your health care provider.

## 2014-09-04 NOTE — ED Provider Notes (Signed)
CSN: [id number]     Arrival date & time 09/04/14  0947 History   First MD Initiated Contact with Patient 09/04/14 [phone]     Chief Complaint  Patient presents with  . Abdominal Pain  . Pruritis     (Consider location/radiation/quality/duration/timing/severity/associated sxs/prior Treatment) HPI Comments: 39 year old female with a past medical history of diabetes and HIV presenting with shortness of breath on exertion and when laying flat 2 weeks. Denies chest pain, cough or wheezing. Denies ever feeling short of breath like this in the past. Admits to associated nonradiating epigastric abdominal pain. Pain is intermittent, coming and going at random. Denies fever, chills, nausea, vomiting or diarrhea. History of cholecystectomy. Denies any urinary or GYN symptoms. Denies abdominal distention. Patient is also complaining of a rash on her abdomen 1 month. States rash is itchy, started as small bumps and got a little bigger. The rash has now spread to her buttock area. Denies any new soaps, detergents or lotions. Last CD4 count October 2015 was 630.  Patient is a 39 y.o. female presenting with abdominal pain. The history is provided by the patient.  Abdominal Pain Associated symptoms: shortness of breath     Past Medical History  Diagnosis Date  . Gallstones 03/10/2011  . HIV infection   . Diabetes mellitus without complication   . Immune deficiency disorder    Past Surgical History  Procedure Laterality Date  . No past surgeries    . Cholecystectomy  06/29/2011    Procedure: LAPAROSCOPIC CHOLECYSTECTOMY WITH INTRAOPERATIVE CHOLANGIOGRAM;  Surgeon: Merrie Roof, MD;  Location: [facility];  Service: General;  Laterality: N/A;   No family history on file. History  Substance Use Topics  . Smoking status: Never Smoker   . Smokeless tobacco: Never Used  . Alcohol Use: No   OB History    No data available     Review of Systems  Respiratory: Positive for shortness of breath.    Gastrointestinal: Positive for abdominal pain.  Skin: Positive for rash.  All other systems reviewed and are negative.     Allergies  Review of patient's allergies indicates no known allergies.  Home Medications   Prior to Admission medications   Medication Sig Start Date End Date Taking? Authorizing Provider  ATRIPLA [phone] MG per tablet TAKE 1 TABLET BY MOUTH AT BEDTIME. 01/12/14   Campbell Riches, MD  metFORMIN (GLUCOPHAGE) 500 MG tablet Take 1 tablet (500 mg total) by mouth 2 (two) times daily with a meal. 02/27/14   Wilber Oliphant, MD   BP 128/78 mmHg  Pulse 76  Temp(Src) 97.5 F (36.4 C) (Oral)  Resp 15  Ht 5\' 7"  (1.702 m)  Wt 295 lb (133.811 kg)  BMI 46.19 kg/m2  SpO2 98%  LMP 08/07/2014 Physical Exam  Constitutional: She is oriented to person, place, and time. She appears well-developed and well-nourished. No distress.  Obese.  HENT:  Head: Normocephalic and atraumatic.  Mouth/Throat: Oropharynx is clear and moist.  Eyes: Conjunctivae and EOM are normal. Pupils are equal, round, and reactive to light.  Neck: Normal range of motion. Neck supple. No JVD present.  Cardiovascular: Normal rate, regular rhythm, normal heart sounds and intact distal pulses.   No extremity edema.  Pulmonary/Chest: Effort normal and breath sounds normal. No respiratory distress. She has no wheezes. She has no rales.  Abdominal: Soft. Bowel sounds are normal. She exhibits no distension. There is no tenderness.  Protuberant abdomen. Mild epigastric tenderness. No peritoneal signs.  Musculoskeletal:  Normal range of motion. She exhibits no edema.  Neurological: She is alert and oriented to person, place, and time. She has normal strength. No sensory deficit.  Speech fluent, goal oriented. Moves limbs without ataxia. Equal grip strength bilateral.  Skin: Skin is warm and dry. She is not diaphoretic.  Small flat dark pigmented lesions on abdomen. Few small maculopapular areas with  excoriations on buttock. No secondary infection. Spares palms of hands, soles of feet.  Psychiatric: She has a normal mood and affect. Her behavior is normal.  Nursing note and vitals reviewed.   ED Course  Procedures (including critical care time) Labs Review Labs Reviewed  CBC WITH DIFFERENTIAL - Abnormal; Notable for the following:    WBC 2.9 (*)    Hemoglobin 9.4 (*)    HCT 29.9 (*)    MCV 77.3 (*)    MCH 24.3 (*)    Platelets 145 (*)    Neutro Abs 1.3 (*)    All other components within normal limits  COMPREHENSIVE METABOLIC PANEL - Abnormal; Notable for the following:    Albumin 2.9 (*)    GFR calc non Af Amer 87 (*)    Anion gap 3 (*)    All other components within normal limits  URINALYSIS, ROUTINE W REFLEX MICROSCOPIC - Abnormal; Notable for the following:    APPearance HAZY (*)    All other components within normal limits  BRAIN NATRIURETIC PEPTIDE - Abnormal; Notable for the following:    B Natriuretic Peptide 185.2 (*)    All other components within normal limits  LIPASE, BLOOD  POC URINE PREG, ED  Randolm Idol, ED    Imaging Review Dg Chest 2 View  09/04/2014   CLINICAL DATA:  Two week history of shortness of breath. Two day history of cough  EXAM: CHEST  2 VIEW  COMPARISON:  September 09, 2009  FINDINGS: There is no edema or consolidation. The heart size and pulmonary vascularity are within normal limits. No adenopathy. No bone lesions.  IMPRESSION: No edema or consolidation.   Electronically Signed   By: Lowella Grip M.D.   On: 09/04/2014 11:51     EKG Interpretation   Date/Time:  Tuesday September 04 2014 10:42:46 EST Ventricular Rate:  76 PR Interval:  183 QRS Duration: 71 QT Interval:  357 QTC Calculation: 401 R Axis:   62 Text Interpretation:  Sinus rhythm No significant change since last  tracing Confirmed by ALLEN  MD, ANTHONY ([id number]) on 09/04/2014 10:48:42 AM      MDM   Final diagnoses:  SOB (shortness of breath)  Epigastric abdominal  pain  Rash   Pt in NAD. AFVSS. Lungs clear. Mild epigastric tenderness. No peritoneal signs. Doubt cardiac. HEART score 2. PERC negative. Doubt PE. Labs showing anemia with hemoglobin of 9.4, no other acute findings. Previous hemoglobin October 2014 of 11.7. No bleeding. CXR negative for pulmonary vascular congestion or edema. She is requesting food throughout her entire encounter. After receiving Toradol, abdominal pain improved. Regarding rash, appears to be contact dermatitis. Will treat with hydrocortisone cream. Labs, imaging studies, vitals and nursing notes reviewed. Follow-up with PCP. Stable for discharge. Return precautions given. Patient states understanding of treatment care plan and is agreeable.   Carman Ching, PA-C 09/04/14 1312  Leota Jacobsen, MD 09/05/14 1001

## 2014-09-04 NOTE — ED Notes (Signed)
Pt presents to department for evaluation of diffuse abdominal pain and itching all over body. Ongoing x2 weeks. History of HIV. Pt is alert and oriented x4. NAD.

## 2014-09-12 ENCOUNTER — Encounter: Payer: Self-pay | Admitting: Internal Medicine

## 2014-10-22 ENCOUNTER — Encounter: Payer: Self-pay | Admitting: *Deleted

## 2014-10-24 ENCOUNTER — Other Ambulatory Visit: Payer: Self-pay | Admitting: *Deleted

## 2014-10-24 ENCOUNTER — Other Ambulatory Visit: Payer: Medicaid Other

## 2014-10-24 ENCOUNTER — Other Ambulatory Visit (HOSPITAL_COMMUNITY)
Admission: RE | Admit: 2014-10-24 | Discharge: 2014-10-24 | Disposition: A | Discharge status: Home / Self Care | Payer: Medicaid Other | Source: Ambulatory Visit | Attending: Internal Medicine | Admitting: Internal Medicine

## 2014-10-24 ENCOUNTER — Telehealth: Payer: Self-pay | Admitting: *Deleted

## 2014-10-24 DIAGNOSIS — Z79899 Other long term (current) drug therapy: Secondary | ICD-10-CM

## 2014-10-24 DIAGNOSIS — Z113 Encounter for screening for infections with a predominantly sexual mode of transmission: Secondary | ICD-10-CM | POA: Insufficient documentation

## 2014-10-24 DIAGNOSIS — B20 Human immunodeficiency virus [HIV] disease: Secondary | ICD-10-CM

## 2014-10-24 LAB — CBC WITH DIFFERENTIAL/PLATELET
BASOS ABS: 0 10*3/uL (ref 0.0–0.1)
BASOS PCT: 0 % (ref 0–1)
EOS ABS: 0.1 10*3/uL (ref 0.0–0.7)
Eosinophils Relative: 2 % (ref 0–5)
HCT: 34.3 % — ABNORMAL LOW (ref 36.0–46.0)
HEMOGLOBIN: 10.8 g/dL — AB (ref 12.0–15.0)
LYMPHS ABS: 1.2 10*3/uL (ref 0.7–4.0)
Lymphocytes Relative: 39 % (ref 12–46)
MCH: 24.1 pg — AB (ref 26.0–34.0)
MCHC: 31.5 g/dL (ref 30.0–36.0)
MCV: 76.6 fL — ABNORMAL LOW (ref 78.0–100.0)
MONOS PCT: 6 % (ref 3–12)
MPV: 9.4 fL (ref 8.6–12.4)
Monocytes Absolute: 0.2 10*3/uL (ref 0.1–1.0)
Neutro Abs: 1.6 10*3/uL — ABNORMAL LOW (ref 1.7–7.7)
Neutrophils Relative %: 53 % (ref 43–77)
PLATELETS: 191 10*3/uL (ref 150–400)
RBC: 4.48 MIL/uL (ref 3.87–5.11)
RDW: 15.4 % (ref 11.5–15.5)
WBC: 3.1 10*3/uL — ABNORMAL LOW (ref 4.0–10.5)

## 2014-10-24 LAB — COMPLETE METABOLIC PANEL WITH GFR
ALT: 13 U/L (ref 0–35)
AST: 22 U/L (ref 0–37)
Albumin: 3.5 g/dL (ref 3.5–5.2)
Alkaline Phosphatase: 83 U/L (ref 39–117)
BUN: 10 mg/dL (ref 6–23)
CO2: 20 mEq/L (ref 19–32)
Calcium: 9.2 mg/dL (ref 8.4–10.5)
Chloride: 102 mEq/L (ref 96–112)
Creat: 0.8 mg/dL (ref 0.50–1.10)
GFR, Est African American: >89 mL/min
GLUCOSE: 194 mg/dL — AB (ref 70–99)
Potassium: 3.9 mEq/L (ref 3.5–5.3)
SODIUM: 136 meq/L (ref 135–145)
Total Bilirubin: 0.4 mg/dL (ref 0.2–1.2)
Total Protein: 8.9 g/dL — ABNORMAL HIGH (ref 6.0–8.3)

## 2014-10-24 LAB — LIPID PANEL
CHOL/HDL RATIO: 3.4 ratio
CHOLESTEROL: 122 mg/dL (ref 0–200)
HDL: 36 mg/dL — ABNORMAL LOW (ref >=46)
LDL CALC: 65 mg/dL (ref 0–99)
TRIGLYCERIDES: 107 mg/dL (ref <150)
VLDL: 21 mg/dL (ref 0–40)

## 2014-10-24 NOTE — Telephone Encounter (Signed)
Patient showed up to clinic today and wanted to be seen for a rash. Advised the patient as she has not been seen in the clinic since 07/2013 she will need to be seen at the Urgent Care or ED. Contact and insurance information verified and patient was scheduled to see a doctor 11/06/14 and had labs today.

## 2014-10-25 LAB — T-HELPER CELL (CD4) - (RCID CLINIC ONLY)
CD4 T CELL ABS: 460 /uL (ref 400–2700)
CD4 T CELL HELPER: 34 % (ref 33–55)

## 2014-10-25 LAB — URINE CYTOLOGY ANCILLARY ONLY
Chlamydia: NEGATIVE
Neisseria Gonorrhea: NEGATIVE

## 2014-10-25 LAB — RPR

## 2014-10-26 LAB — HIV-1 RNA ULTRAQUANT REFLEX TO GENTYP+
HIV 1 RNA Quant: 489 copies/mL — ABNORMAL HIGH (ref <20)
HIV-1 RNA Quant, Log: 2.69 {Log} — ABNORMAL HIGH (ref <1.30)

## 2014-10-29 LAB — HLA B*5701: HLA-B*5701 w/rflx HLA-B High: NEGATIVE

## 2014-11-06 ENCOUNTER — Ambulatory Visit (INDEPENDENT_AMBULATORY_CARE_PROVIDER_SITE_OTHER): Payer: Medicaid Other | Admitting: Internal Medicine

## 2014-11-06 ENCOUNTER — Encounter: Payer: Self-pay | Admitting: Internal Medicine

## 2014-11-06 VITALS — BP 119/82 | HR 82 | Temp 98.2°F | Ht 67.5 in | Wt 266.0 lb

## 2014-11-06 DIAGNOSIS — L731 Pseudofolliculitis barbae: Secondary | ICD-10-CM

## 2014-11-06 DIAGNOSIS — B86 Scabies: Secondary | ICD-10-CM | POA: Insufficient documentation

## 2014-11-06 DIAGNOSIS — L738 Other specified follicular disorders: Secondary | ICD-10-CM

## 2014-11-06 DIAGNOSIS — B20 Human immunodeficiency virus [HIV] disease: Secondary | ICD-10-CM

## 2014-11-06 MED ORDER — ELVITEG-COBIC-EMTRICIT-TENOFAF 150-150-200-10 MG PO TABS: 1.0000 | ORAL_TABLET | Freq: Every day | ORAL | Status: DC

## 2014-11-06 MED ORDER — HYDROCORTISONE 2.5 % EX OINT: TOPICAL_OINTMENT | Freq: Two times a day (BID) | CUTANEOUS | Status: DC

## 2014-11-06 NOTE — Progress Notes (Signed)
   Subjective:    Patient ID: Beverly Johnson, female    DOB: 1975-08-31, 38 y.o.   MRN: [id number]  HPI She is here for follow-up of HIV. She has had very sporadic follow-up and has been on a Atripla. He previously has been undetectable the last lab in October revealed a viral load of 160 and again 2 weeks prior to this visit and her viral load was up to 489. Her CD4 count is 460 which is down from previous. In discussion of this with her she did tell me that she has not been taking her medications daily. She feels that she has side effects of the medication including drowsiness and a "burning feeling" when she takes a Atripla. She therefore has been tired of taking it. She also complains of a rash which has been itchy and is on her abdomen, arms and back. She feels that she scratches at night and is asleep.   Review of Systems  Constitutional: Negative for appetite change, fatigue and unexpected weight change.  HENT: Negative for trouble swallowing.   Gastrointestinal: Negative for nausea, abdominal pain and diarrhea.  Skin:       Diffuse pruritic rash  Neurological: Negative for dizziness and light-headedness.  Psychiatric/Behavioral: Positive for sleep disturbance.       Difficulty sleeping with rash       Objective:   Physical Exam  Constitutional: She appears well-developed and well-nourished. No distress.  HENT:  Mouth/Throat: No oropharyngeal exudate.  Eyes: No scleral icterus.  Cardiovascular: Normal rate, regular rhythm and normal heart sounds.   No murmur heard. Pulmonary/Chest: Effort normal and breath sounds normal. No respiratory distress. She has no rales.  Lymphadenopathy:    She has no cervical adenopathy.  Skin:  She has diffuse 3-4 mm macular pruritic rash on her abdomen, arms and back          Assessment & Plan:

## 2014-11-06 NOTE — Assessment & Plan Note (Signed)
I did discuss with her different options available for regimens for HIV and with her issue with the efavirenz would be optimal to take another medication. I will change her to [facility] and this was sent to her pharmacy. She will follow up in 1 month for labs and I will see her after that see if she is tolerating it well.

## 2014-11-06 NOTE — Assessment & Plan Note (Signed)
This is likely from her active virus. I will try topical steroids and she's been use Benadryl at night to help her sleep. Hopefully this will resolve with her virus again becoming suppressed.

## 2014-11-26 ENCOUNTER — Other Ambulatory Visit: Payer: Medicaid Other

## 2014-12-04 ENCOUNTER — Other Ambulatory Visit: Payer: Medicaid Other

## 2014-12-04 DIAGNOSIS — B20 Human immunodeficiency virus [HIV] disease: Secondary | ICD-10-CM

## 2014-12-04 LAB — COMPLETE METABOLIC PANEL WITH GFR
ALK PHOS: 102 U/L (ref 39–117)
ALT: 10 U/L (ref 0–35)
AST: 18 U/L (ref 0–37)
Albumin: 3.5 g/dL (ref 3.5–5.2)
BUN: 9 mg/dL (ref 6–23)
CALCIUM: 8.7 mg/dL (ref 8.4–10.5)
CHLORIDE: 107 meq/L (ref 96–112)
CO2: 23 meq/L (ref 19–32)
Creat: 0.75 mg/dL (ref 0.50–1.10)
GFR, Est African American: >89 mL/min
GLUCOSE: 123 mg/dL — AB (ref 70–99)
Potassium: 3.8 mEq/L (ref 3.5–5.3)
SODIUM: 137 meq/L (ref 135–145)
Total Bilirubin: 0.3 mg/dL (ref 0.2–1.2)
Total Protein: 8 g/dL (ref 6.0–8.3)

## 2014-12-04 LAB — CBC WITH DIFFERENTIAL/PLATELET
Basophils Absolute: 0 10*3/uL (ref 0.0–0.1)
Basophils Relative: 0 % (ref 0–1)
EOS ABS: 0.1 10*3/uL (ref 0.0–0.7)
EOS PCT: 3 % (ref 0–5)
HCT: 31.3 % — ABNORMAL LOW (ref 36.0–46.0)
HEMOGLOBIN: 10.1 g/dL — AB (ref 12.0–15.0)
LYMPHS ABS: 1.3 10*3/uL (ref 0.7–4.0)
LYMPHS PCT: 45 % (ref 12–46)
MCH: 24.6 pg — AB (ref 26.0–34.0)
MCHC: 32.3 g/dL (ref 30.0–36.0)
MCV: 76.2 fL — AB (ref 78.0–100.0)
MONOS PCT: 8 % (ref 3–12)
MPV: 9.4 fL (ref 8.6–12.4)
Monocytes Absolute: 0.2 10*3/uL (ref 0.1–1.0)
Neutro Abs: 1.3 10*3/uL — ABNORMAL LOW (ref 1.7–7.7)
Neutrophils Relative %: 44 % (ref 43–77)
Platelets: 153 10*3/uL (ref 150–400)
RBC: 4.11 MIL/uL (ref 3.87–5.11)
RDW: 16.1 % — ABNORMAL HIGH (ref 11.5–15.5)
WBC: 2.9 10*3/uL — ABNORMAL LOW (ref 4.0–10.5)

## 2014-12-05 LAB — T-HELPER CELL (CD4) - (RCID CLINIC ONLY)
CD4 % Helper T Cell: 35 % (ref 33–55)
CD4 T Cell Abs: 490 /uL (ref 400–2700)

## 2014-12-06 LAB — HIV-1 RNA QUANT-NO REFLEX-BLD
HIV 1 RNA Quant: <20 copies/mL (ref <20)
HIV-1 RNA Quant, Log: <1.3 {Log} (ref <1.30)

## 2014-12-25 ENCOUNTER — Ambulatory Visit: Payer: Medicaid Other | Admitting: Internal Medicine

## 2014-12-25 ENCOUNTER — Ambulatory Visit (INDEPENDENT_AMBULATORY_CARE_PROVIDER_SITE_OTHER): Payer: Medicaid Other | Admitting: Internal Medicine

## 2014-12-25 ENCOUNTER — Encounter: Payer: Self-pay | Admitting: Internal Medicine

## 2014-12-25 VITALS — BP 137/91 | HR 73 | Temp 98.1°F | Ht 67.0 in | Wt 269.0 lb

## 2014-12-25 DIAGNOSIS — B86 Scabies: Secondary | ICD-10-CM

## 2014-12-25 DIAGNOSIS — B20 Human immunodeficiency virus [HIV] disease: Secondary | ICD-10-CM

## 2014-12-25 MED ORDER — PERMETHRIN 5 % EX CREA: 1.0000 "application " | TOPICAL_CREAM | Freq: Once | CUTANEOUS | Status: DC

## 2014-12-25 NOTE — Assessment & Plan Note (Signed)
She is much improved and pleased with the regimen. She'll return in 3 months to assure suppression and then we'll space the visits out after that.

## 2014-12-25 NOTE — Assessment & Plan Note (Signed)
I suspect this is scabies and will have her try permethrin and discussed that she should put it on and then clean her house including washing sheets in washing off counters and all of her close. Then to wash the cream off after 8-12 hours. I gave her 1 refill as well in case she needs to repeat.

## 2014-12-25 NOTE — Progress Notes (Signed)
   Subjective:    Patient ID: Beverly Johnson, female    DOB: 01/02/1976, 39 y.o.   MRN: [id number]  HPI She is here for follow-up of HIV.  She has a long history of HIV with sporadic follow-up and poor compliance on a Atripla and I last visit changed her to [place]. She is very pleased with the regimen having had side effects from the previous a Atripla including sleep difficulties.  She now is having no significant side effects and has been taking daily with no missed doses. Her viral load reflects this and is undetectable for the first time since 2013. Her CD4 stable at 490.  She continues to have itching of her skin and small papular lesions that she scratches.  No pus. 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   Review of Systems  Constitutional: Negative for fatigue.  Gastrointestinal: Negative for nausea and diarrhea.  Skin:       Papillary lesions on arms and breast. She does get them in between her fingers as well  Neurological: Negative for dizziness and light-headedness.  Psychiatric/Behavioral: Negative for sleep disturbance and dysphoric mood.       Objective:   Physical Exam  Constitutional: She appears well-developed and well-nourished. No distress.  HENT:  Mouth/Throat: No oropharyngeal exudate.  Eyes: No scleral icterus.  Cardiovascular: Normal rate, regular rhythm and normal heart sounds.   No murmur heard. Pulmonary/Chest: Effort normal and breath sounds normal. No respiratory distress.  Lymphadenopathy:    She has no cervical adenopathy.  Skin:  Small papular lesions on her right breast and one or 2 noted on her arms          Assessment & Plan:

## 2014-12-27 ENCOUNTER — Encounter: Payer: Self-pay | Admitting: *Deleted

## 2015-01-03 ENCOUNTER — Telehealth: Payer: Self-pay | Admitting: *Deleted

## 2015-01-03 NOTE — Telephone Encounter (Signed)
Attempting to schedule pt for PAP smear.  Was told that this was not this pt's telephone number.  Received a new number for the pt from [employer].  Left message for pt to call to schedule an appt.

## 2015-02-11 ENCOUNTER — Other Ambulatory Visit: Payer: Self-pay

## 2015-04-17 ENCOUNTER — Other Ambulatory Visit: Payer: Self-pay | Admitting: Internal Medicine

## 2015-04-17 DIAGNOSIS — B20 Human immunodeficiency virus [HIV] disease: Secondary | ICD-10-CM

## 2015-05-08 ENCOUNTER — Ambulatory Visit: Payer: Medicaid Other | Admitting: Internal Medicine

## 2015-05-08 ENCOUNTER — Encounter: Payer: Self-pay | Admitting: Internal Medicine

## 2015-05-29 ENCOUNTER — Ambulatory Visit (INDEPENDENT_AMBULATORY_CARE_PROVIDER_SITE_OTHER): Payer: Medicaid Other | Admitting: Internal Medicine

## 2015-05-29 ENCOUNTER — Encounter: Payer: Self-pay | Admitting: Internal Medicine

## 2015-05-29 VITALS — BP 130/78 | HR 87 | Temp 98.2°F | Ht 66.0 in | Wt 290.7 lb

## 2015-05-29 DIAGNOSIS — E119 Type 2 diabetes mellitus without complications: Secondary | ICD-10-CM

## 2015-05-29 LAB — POCT GLYCOSYLATED HEMOGLOBIN (HGB A1C): HEMOGLOBIN A1C: 7.6

## 2015-05-29 LAB — GLUCOSE, CAPILLARY: Glucose-Capillary: 157 mg/dL — ABNORMAL HIGH (ref 65–99)

## 2015-05-29 MED ORDER — METFORMIN HCL 500 MG PO TABS: 500.0000 mg | ORAL_TABLET | Freq: Every day | ORAL | Status: DC

## 2015-05-29 NOTE — Assessment & Plan Note (Signed)
HgB A1c increased from 6.6 last year for 7.6 today. She reports a largely sedentary lifestyle and frequently eats candy. She says she did not take her previously prescribed metformin because she didn't feel like it, not because she had any adverse effect. She is not interested in meeting with a diabetes coordinator but says she will try out metformin, especially after I described the long-term consequences of uncontrolled diabetes. She indicates she will focus on cutting out candy first. - 500 mg metformin once daily, will increase to BID at next visit if no improvement in A1c and if she tolerates the medication - Counseled on dietary habits and exercise, informed she might be able to come off medication if she loses weight

## 2015-05-29 NOTE — Progress Notes (Signed)
   Subjective:    Patient ID: Beverly Johnson, female    DOB: July 06, 1976, 39 y.o.   MRN: [id number]  HPI  Beverly Johnson is a 39 year old with a PMH of HIV (CD4 460, VL undetectable) and T2DM who comes to the clinic for a general wellness visit. She has no acute complaints. She says she has not been taking the metformin that was prescribed to her previously.   Review of Systems  Constitutional: Negative for fever and fatigue.       Recent weight gain.  HENT: Negative for sore throat.   Respiratory: Negative for cough and shortness of breath.   Cardiovascular: Negative for chest pain.  Gastrointestinal: Negative for nausea, vomiting and diarrhea.  Genitourinary: Negative for dysuria.  Musculoskeletal: Negative for back pain and arthralgias.  Allergic/Immunologic: Negative for environmental allergies.  Neurological: Negative for light-headedness and headaches.  Psychiatric/Behavioral: Negative for dysphoric mood.       Objective:   Physical Exam  Constitutional:  Obese woman in no acute distress  HENT:  Mouth/Throat: Oropharynx is clear and moist. No oropharyngeal exudate.  Eyes: EOM are normal. Pupils are equal, round, and reactive to light.  Cardiovascular: Normal rate and regular rhythm.   Pulmonary/Chest: Effort normal and breath sounds normal. No respiratory distress.  Abdominal: Soft. Bowel sounds are normal. She exhibits no distension.  Lymphadenopathy:    She has no cervical adenopathy.  Neurological: She is alert. She displays normal reflexes.  Skin: Skin is warm and dry.  Psychiatric: She has a normal mood and affect.  Vitals reviewed.         Assessment & Plan:  See problem based assessment and plan for details.

## 2015-05-29 NOTE — Patient Instructions (Signed)
Ms. Mathurin, it was a pleasure meeting you today. It seems your diabetes is worsening, which is likely related to your recent weight gain. We're going to prescribe a medication called Metformin, which you will take once a day. However, the most important thing you can do it to lose weight, which is accomplished through changes in diet and exercise. Small changes to diet, like cutting out candy, can have a large impact. We will see you back in three months to see how your diabetes and weight loss are doing. Below I've also included some instructions on some simple exercises.    Exercising to Lose Weight Exercising can help you to lose weight. In order to lose weight through exercise, you need to do vigorous-intensity exercise. You can tell that you are exercising with vigorous intensity if you are breathing very hard and fast and cannot hold a conversation while exercising. Moderate-intensity exercise helps to maintain your current weight. You can tell that you are exercising at a moderate level if you have a higher heart rate and faster breathing, but you are still able to hold a conversation. HOW OFTEN SHOULD I EXERCISE? Choose an activity that you enjoy and set realistic goals. Your health care provider can help you to make an activity plan that works for you. Exercise regularly as directed by your health care provider. This may include:  Doing resistance training twice each week, such as:  Push-ups.  Sit-ups.  Lifting weights.  Using resistance bands.  Doing a given intensity of exercise for a given amount of time. Choose from these options:  150 minutes of moderate-intensity exercise every week.  75 minutes of vigorous-intensity exercise every week.  A mix of moderate-intensity and vigorous-intensity exercise every week.    HOW CAN I BE MORE ACTIVE IN MY DAY-TO-DAY ACTIVITIES?  Use the stairs instead of the elevator.  Take a walk during your lunch break.  If you drive, park your  car farther away from work or school.  If you take public transportation, get off one stop early and walk the rest of the way.  Make all of your phone calls while standing up and walking around.  Get up, stretch, and walk around every 30 minutes throughout the day. WHAT GUIDELINES SHOULD I FOLLOW WHILE EXERCISING?  Do not exercise so much that you hurt yourself, feel dizzy, or get very short of breath.  Consult your health care provider prior to starting a new exercise program.  Wear comfortable clothes and shoes with good support.  Drink plenty of water while you exercise to prevent dehydration or heat stroke. Body water is lost during exercise and must be replaced.  Work out until you breathe faster and your heart beats faster.   This information is not intended to replace advice given to you by your health care provider. Make sure you discuss any questions you have with your health care provider.   Document Released: 09/05/2010 Document Revised: 08/24/2014 Document Reviewed: 01/04/2014 Elsevier Interactive Patient Education [employer].

## 2015-06-05 NOTE — Progress Notes (Signed)
I saw and evaluated the patient.  I personally confirmed the key portions of Dr. Ford's history and exam and reviewed pertinent patient test results.  The assessment, diagnosis, and plan were formulated together and I agree with the documentation in the resident's note. 

## 2015-07-02 ENCOUNTER — Other Ambulatory Visit: Payer: Medicaid Other

## 2015-07-02 DIAGNOSIS — B20 Human immunodeficiency virus [HIV] disease: Secondary | ICD-10-CM

## 2015-07-03 LAB — T-HELPER CELL (CD4) - (RCID CLINIC ONLY)
CD4 T CELL ABS: 580 /uL (ref 400–2700)
CD4 T CELL HELPER: 34 % (ref 33–55)

## 2015-07-04 LAB — HIV-1 RNA QUANT-NO REFLEX-BLD: HIV 1 RNA Quant: <20 copies/mL (ref <20)

## 2015-07-25 ENCOUNTER — Ambulatory Visit (INDEPENDENT_AMBULATORY_CARE_PROVIDER_SITE_OTHER): Payer: Medicaid Other | Admitting: Internal Medicine

## 2015-07-25 VITALS — BP 140/92 | HR 84 | Temp 98.0°F | Ht 67.0 in | Wt 286.5 lb

## 2015-07-25 DIAGNOSIS — Z113 Encounter for screening for infections with a predominantly sexual mode of transmission: Secondary | ICD-10-CM

## 2015-07-25 DIAGNOSIS — B20 Human immunodeficiency virus [HIV] disease: Secondary | ICD-10-CM | POA: Diagnosis present

## 2015-07-25 DIAGNOSIS — Z79899 Other long term (current) drug therapy: Secondary | ICD-10-CM | POA: Insufficient documentation

## 2015-07-25 DIAGNOSIS — Z6841 Body Mass Index (BMI) 40.0 and over, adult: Secondary | ICD-10-CM

## 2015-07-25 NOTE — Assessment & Plan Note (Signed)
Encouraged continued efforts to exercise and loose weight

## 2015-07-25 NOTE — Progress Notes (Signed)
Patient ID: Beverly Johnson, female   DOB: 02-Jun-1976, 39 y.o.   MRN: [id number] CC: Follow up for HIV  Interval history: Currently is asymptomatic and well-controlled on Genvoya, which she started earlier this year, changing from [place].  Since last visit she has had no new issues, continues to see her PCP and work on her diabetes, though A1C has increased.   Has no associated n/v.  Denies any missed doses.     Prior to Admission medications   Medication Sig Start Date End Date Taking? Authorizing Provider  GENVOYA 150-150-200-10 MG TABS tablet TAKE 1 TABLET BY MOUTH DAILY WITH BREAKFAST. 04/17/15   Thayer Headings, MD  metFORMIN (GLUCOPHAGE) 500 MG tablet Take 1 tablet (500 mg total) by mouth daily with breakfast. 05/29/15 05/28/16  Liberty Handy, MD    Review of Systems Constitutional: negative for fatigue Gastrointestinal: negative for nausea and vomiting Musculoskeletal: negative for myalgias and arthralgias All other systems reviewed and are negative   Physical Exam: CONSTITUTIONAL:in no apparent distress and alert  Filed Vitals:   07/25/15 0958  BP: 140/92  Pulse: 84  Temp: 98 F (36.7 C)   Eyes: anicteric HENT: no thrush, no cervical lymphadenopathy Respiratory: Normal respiratory effort; CTA B Psych: flat affect  Lab Results  Component Value Date   HIV1RNAQUANT <20 07/02/2015   HIV1RNAQUANT <20 12/04/2014   HIV1RNAQUANT 489* 10/24/2014   No components found for: HIV1GENOTYPRPLUS No components found for: THELPERCELL

## 2015-07-25 NOTE — Assessment & Plan Note (Signed)
Doing great on the Genvoya and will rtc 6 months.

## 2015-10-28 ENCOUNTER — Telehealth: Payer: Self-pay | Admitting: *Deleted

## 2015-10-28 NOTE — Telephone Encounter (Signed)
Patient called for advice. She has dental pain and her dentist is unable to see her.  She has called her PCP, they do not have anything available.  She states she was advised to go to the Urgent Care for evaluation as she states her pain is too great to wait.  RN agreed, advised her to follow her PCP's advice.  She states she will go to [facility]. Landis Gandy, RN

## 2015-11-15 ENCOUNTER — Other Ambulatory Visit: Payer: Self-pay | Admitting: Internal Medicine

## 2015-11-27 ENCOUNTER — Telehealth: Payer: Self-pay | Admitting: Dietician

## 2015-11-27 NOTE — Telephone Encounter (Signed)
Wanted a meter to check her blood sugars, afraid of a low blood sugar. Diabetes educator told her how to reset her meter and to call the tool free number on the back if it did not work. Also advised that metformin does not usually make blood sugar lower than normal and that self monitoring is optional when on that medicine. Encouraged her to schedule an appointment with her doctor.

## 2015-12-04 ENCOUNTER — Ambulatory Visit (INDEPENDENT_AMBULATORY_CARE_PROVIDER_SITE_OTHER): Payer: Medicaid Other | Admitting: Internal Medicine

## 2015-12-04 ENCOUNTER — Encounter: Payer: Self-pay | Admitting: Internal Medicine

## 2015-12-04 ENCOUNTER — Other Ambulatory Visit: Payer: Self-pay | Admitting: Dietician

## 2015-12-04 VITALS — BP 135/87 | HR 71 | Temp 98.5°F | Ht 67.0 in | Wt 283.7 lb

## 2015-12-04 DIAGNOSIS — B20 Human immunodeficiency virus [HIV] disease: Secondary | ICD-10-CM

## 2015-12-04 DIAGNOSIS — Z21 Asymptomatic human immunodeficiency virus [HIV] infection status: Secondary | ICD-10-CM

## 2015-12-04 DIAGNOSIS — E119 Type 2 diabetes mellitus without complications: Secondary | ICD-10-CM

## 2015-12-04 DIAGNOSIS — Z7984 Long term (current) use of oral hypoglycemic drugs: Secondary | ICD-10-CM

## 2015-12-04 DIAGNOSIS — L918 Other hypertrophic disorders of the skin: Secondary | ICD-10-CM | POA: Insufficient documentation

## 2015-12-04 DIAGNOSIS — D2230 Melanocytic nevi of unspecified part of face: Secondary | ICD-10-CM | POA: Diagnosis not present

## 2015-12-04 LAB — HM DIABETES EYE EXAM

## 2015-12-04 LAB — POCT GLYCOSYLATED HEMOGLOBIN (HGB A1C): HEMOGLOBIN A1C: 8.6

## 2015-12-04 LAB — GLUCOSE, CAPILLARY: GLUCOSE-CAPILLARY: 240 mg/dL — AB (ref 65–99)

## 2015-12-04 MED ORDER — ACCU-CHEK NANO SMARTVIEW W/DEVICE KIT: PACK | Status: DC

## 2015-12-04 MED ORDER — ACCU-CHEK FASTCLIX LANCETS MISC: Status: DC

## 2015-12-04 MED ORDER — METFORMIN HCL 1000 MG PO TABS: 1000.0000 mg | ORAL_TABLET | Freq: Two times a day (BID) | ORAL | Status: DC

## 2015-12-04 MED ORDER — GLUCOSE BLOOD VI STRP: ORAL_STRIP | Status: DC

## 2015-12-04 NOTE — Patient Instructions (Signed)
Thank you for your visit today.   Please return to the internal medicine clinic in 1 month(s) or sooner if needed.     I have made the following additions/changes to your medications:  Increase metformin to 1000mg  twice daily with meals. We will refer you to dermatology. I will also get an eye exam.   Please be sure to bring all of your medications with you to every visit; this includes herbal supplements, vitamins, eye drops, and any over-the-counter medications.   Should you have any questions regarding your medications and/or any new or worsening symptoms, please be sure to call the clinic at [phone].   If you believe that you are suffering from a life threatening condition or one that may result in the loss of limb or function, then you should call 911 and proceed to the nearest Emergency Department.

## 2015-12-04 NOTE — Progress Notes (Addendum)
Patient ID: Beverly Johnson, female   DOB: Dec 18, 1975, 40 y.o.   MRN: [id number]     Subjective:   Patient ID: Beverly Johnson female    DOB: July 10, 1976 40 y.o.    MRN: [id number] Health Maintenance Due: Health Maintenance Due  Topic Date Due  . PNEUMOCOCCAL POLYSACCHARIDE VACCINE (2) 09/30/2011  . PAP SMEAR  09/10/2012  . OPHTHALMOLOGY EXAM  05/03/2013  . FOOT EXAM  02/28/2015  . URINE MICROALBUMIN  02/28/2015  . HEMOGLOBIN A1C  08/29/2015  . LIPID PANEL  10/24/2015    _________________________________________________  HPI: Beverly Johnson is a 40 y.o. female here for diabetes follow up.  Pt has a PMH outlined below.  Please see problem-based charting assessment and plan for further status of patient's chronic medical problems addressed at today's visit.  PMH: Past Medical History  Diagnosis Date  . Gallstones 03/10/2011  . HIV infection ([place])   . Diabetes mellitus without complication ([place])   . Immune deficiency disorder ([place])     Medications: Current Outpatient Prescriptions on File Prior to Visit  Medication Sig Dispense Refill  . GENVOYA 150-150-200-10 MG TABS tablet TAKE 1 TABLET BY MOUTH DAILY WITH BREAKFAST. 30 tablet 5   No current facility-administered medications on file prior to visit.    Allergies: No Known Allergies  FH: No family history on file.  SH: Social History   Social History  . Marital Status: Married    Spouse Name: N/A  . Number of Children: N/A  . Years of Education: N/A   Social History Main Topics  . Smoking status: Never Smoker   . Smokeless tobacco: Never Used  . Alcohol Use: No  . Drug Use: No  . Sexual Activity: Not Asked     Comment: pt. declined condoms   Other Topics Concern  . None   Social History Narrative    Review of Systems: Constitutional: Negative for fever, chills.  Eyes: Negative for blurred vision.  Respiratory: Negative for cough and shortness of breath.  Cardiovascular: Negative for chest pain.    Gastrointestinal: Negative for nausea, vomiting, diarrhea.  Neurological: Negative for dizziness.   Objective:   Vital Signs: Filed Vitals:   12/04/15 1355  BP: 135/87  Pulse: 71  Temp: 98.5 F (36.9 C)  TempSrc: Oral  Height: 5\' 7"  (1.702 m)  Weight: 283 lb 11.2 oz (128.685 kg)  SpO2: 100%      BP Readings from Last 3 Encounters:  12/04/15 135/87  07/25/15 140/92  05/29/15 130/78    Physical Exam: Constitutional: Vital signs reviewed.  Patient is in NAD and cooperative with exam.  Head: Normocephalic and atraumatic, several nevi and an acrochordon. Eyes: EOMI, conjunctivae nl, no scleral icterus.  Neck: Supple. Cardiovascular: RRR, no MRG. Pulmonary/Chest: Normal effort, CTAB, no wheezes, rales, or rhonchi. Abdominal: Soft. Obese. NT/ND +BS. Neurological: A&O x3, cranial nerves II-XII are grossly intact, moving all extremities. Extremities: 2+DP b/l; no LE edema. Skin: Warm, dry and intact. Several moles on her face.    Assessment & Plan:   Assessment and plan was discussed and formulated with my attending.

## 2015-12-04 NOTE — Addendum Note (Signed)
Addended by: Lockyer Bales on: 12/04/2015 03:57 PM   Modules accepted: Orders

## 2015-12-04 NOTE — Assessment & Plan Note (Addendum)
Pt takes metformin daily in the AM and reports she is compliant.  She has had diabetes since 2013.  No polyuria, polydipsia, polyphagia.  Ha1c 8.6 today. -increase metformin to 1000mg  xr bid -discussed possibly adding a GLP1 (she does not seem amenable to injections) -referral to [facility] -discussed decreasing carbs, juices, milk -discussed diet and weight loss helping DM  -retinal scan, lipid panel, urine micro alb

## 2015-12-04 NOTE — Assessment & Plan Note (Signed)
Follows with Dr. Linus Salmons. -cont current meds

## 2015-12-04 NOTE — Assessment & Plan Note (Signed)
Pt has an acrochordon and several nevi on her face that she would like removed.  No itching, bleeding, or increase is size, symmetry, or change in color of nevi.   -referral to dermatology

## 2015-12-04 NOTE — Progress Notes (Signed)
Medicine attending: Medical history, presenting problems, physical findings, and medications, reviewed with resident physician Dr Michail Jewels on the day of the patient visit and I concur with her evaluation and management plan.

## 2015-12-05 LAB — MICROALBUMIN / CREATININE URINE RATIO
CREATININE, UR: 144 mg/dL
MICROALB/CREAT RATIO: 4.4 mg/g creat (ref 0.0–30.0)
MICROALBUM., U, RANDOM: 6.4 ug/mL

## 2015-12-06 ENCOUNTER — Encounter: Payer: Self-pay | Admitting: Dietician

## 2015-12-08 ENCOUNTER — Encounter (HOSPITAL_COMMUNITY): Payer: Self-pay | Admitting: Emergency Medicine

## 2015-12-08 ENCOUNTER — Emergency Department (HOSPITAL_COMMUNITY): Payer: Medicaid Other

## 2015-12-08 ENCOUNTER — Emergency Department (HOSPITAL_COMMUNITY)
Admission: EM | Admit: 2015-12-08 | Discharge: 2015-12-09 | Disposition: A | Discharge status: Home / Self Care | Payer: Medicaid Other | Attending: Emergency Medicine | Admitting: Emergency Medicine

## 2015-12-08 DIAGNOSIS — Z7984 Long term (current) use of oral hypoglycemic drugs: Secondary | ICD-10-CM | POA: Diagnosis not present

## 2015-12-08 DIAGNOSIS — E119 Type 2 diabetes mellitus without complications: Secondary | ICD-10-CM | POA: Diagnosis not present

## 2015-12-08 DIAGNOSIS — Z79899 Other long term (current) drug therapy: Secondary | ICD-10-CM | POA: Diagnosis not present

## 2015-12-08 DIAGNOSIS — Z8719 Personal history of other diseases of the digestive system: Secondary | ICD-10-CM | POA: Diagnosis not present

## 2015-12-08 DIAGNOSIS — R079 Chest pain, unspecified: Secondary | ICD-10-CM | POA: Diagnosis present

## 2015-12-08 DIAGNOSIS — B20 Human immunodeficiency virus [HIV] disease: Secondary | ICD-10-CM | POA: Insufficient documentation

## 2015-12-08 DIAGNOSIS — R0789 Other chest pain: Secondary | ICD-10-CM | POA: Diagnosis not present

## 2015-12-08 DIAGNOSIS — E876 Hypokalemia: Secondary | ICD-10-CM | POA: Diagnosis not present

## 2015-12-08 DIAGNOSIS — D649 Anemia, unspecified: Secondary | ICD-10-CM | POA: Diagnosis not present

## 2015-12-08 LAB — BASIC METABOLIC PANEL
ANION GAP: 11 (ref 5–15)
BUN: 10 mg/dL (ref 6–20)
CO2: 21 mmol/L — AB (ref 22–32)
Calcium: 9.1 mg/dL (ref 8.9–10.3)
Chloride: 104 mmol/L (ref 101–111)
Creatinine, Ser: 0.77 mg/dL (ref 0.44–1.00)
GLUCOSE: 174 mg/dL — AB (ref 65–99)
POTASSIUM: 3.2 mmol/L — AB (ref 3.5–5.1)
Sodium: 136 mmol/L (ref 135–145)

## 2015-12-08 LAB — I-STAT TROPONIN, ED: Troponin i, poc: 0 ng/mL (ref 0.00–0.08)

## 2015-12-08 LAB — CBC
HEMATOCRIT: 33.4 % — AB (ref 36.0–46.0)
HEMOGLOBIN: 10.7 g/dL — AB (ref 12.0–15.0)
MCH: 26 pg (ref 26.0–34.0)
MCHC: 32 g/dL (ref 30.0–36.0)
MCV: 81.1 fL (ref 78.0–100.0)
Platelets: 166 10*3/uL (ref 150–400)
RBC: 4.12 MIL/uL (ref 3.87–5.11)
RDW: 13 % (ref 11.5–15.5)
WBC: 4.8 10*3/uL (ref 4.0–10.5)

## 2015-12-08 MED ORDER — KETOROLAC TROMETHAMINE 30 MG/ML IJ SOLN: 30.0000 mg | Freq: Once | INTRAMUSCULAR | Status: DC

## 2015-12-08 MED ORDER — KETOROLAC TROMETHAMINE 60 MG/2ML IM SOLN
60.0000 mg | Freq: Once | INTRAMUSCULAR | Status: AC
  Administered 2015-12-08: 60 mg via INTRAMUSCULAR
  Filled 2015-12-08: qty 2

## 2015-12-08 NOTE — ED Provider Notes (Signed)
CSN: [id number]     Arrival date & time 12/08/15  2138 History  By signing my name below, I, Evelene Croon, attest that this documentation has been prepared under the direction and in the presence of Delora Fuel, MD . Electronically Signed: Evelene Croon, Scribe. 12/08/2015. 11:43 PM.    Chief Complaint  Patient presents with  . Chest Pain   The history is provided by the patient. No language interpreter was used.   HPI Comments:  Beverly Johnson is a 40 y.o. female with a history of NIDDM and HIV, who presents to the Emergency Department complaining of gradually worsening sharp  right sided CP, onset ~ 1640 today. She rates her pain a 10/10 and notes her pain was exacerbated after eating. She denies nausea, SOB, and diaphoresis. She also denies tobacco use, recent surgery, recent long periods of immobilization,  h/o PE/DVT, HTN and HLD. No alleviating factors noted.  Past Medical History  Diagnosis Date  . Gallstones 03/10/2011  . HIV infection ([place])   . Diabetes mellitus without complication ([employer])   . Immune deficiency disorder [facility])    Past Surgical History  Procedure Laterality Date  . No past surgeries    . Cholecystectomy  06/29/2011    Procedure: LAPAROSCOPIC CHOLECYSTECTOMY WITH INTRAOPERATIVE CHOLANGIOGRAM;  Surgeon: Merrie Roof, MD;  Location: [facility];  Service: General;  Laterality: N/A;   No family history on file. Social History  Substance Use Topics  . Smoking status: Never Smoker   . Smokeless tobacco: Never Used  . Alcohol Use: No   OB History    No data available     Review of Systems  Constitutional: Negative for diaphoresis.  Respiratory: Negative for shortness of breath.   Cardiovascular: Positive for chest pain.  Gastrointestinal: Negative for nausea.  All other systems reviewed and are negative.   Allergies  Review of patient's allergies indicates no known allergies.  Home Medications   Prior to Admission medications   Medication Sig Start  Date End Date Taking? Authorizing Provider  ACCU-CHEK FASTCLIX LANCETS MISC Check blood sugar one time a day 12/04/15  Yes Calix Bales, MD  Blood Glucose Monitoring Suppl (ACCU-CHEK NANO SMARTVIEW) w/Device KIT Check blood sugar one time a day 12/04/15  Yes Hogen Bales, MD  GENVOYA 150-150-200-10 MG TABS tablet TAKE 1 TABLET BY MOUTH DAILY WITH BREAKFAST. 11/18/15  Yes Thayer Headings, MD  glucose blood (ACCU-CHEK SMARTVIEW) test strip Check blood sugar one time a day 12/04/15  Yes Makarewicz Bales, MD  metFORMIN (GLUCOPHAGE) 1000 MG tablet Take 1 tablet (1,000 mg total) by mouth 2 (two) times daily with a meal. 12/04/15 12/03/16 Yes Cocozza Bales, MD   Temp(Src) 98.7 F (37.1 C) (Oral)  Ht _0  (1.702 m)  Wt 285 lb (129.275 kg)  BMI 44.63 kg/m2  SpO2 100%  LMP 12/02/2015 (Approximate) Physical Exam  Constitutional: She is oriented to person, place, and time. She appears well-developed and well-nourished. No distress.  HENT:  Head: Normocephalic and atraumatic.  Eyes: Conjunctivae and EOM are normal. Pupils are equal, round, and reactive to light.  Neck: Normal range of motion. Neck supple. No JVD present.  Cardiovascular: Normal rate, regular rhythm and normal heart sounds.   No murmur heard. Pulmonary/Chest: Effort normal and breath sounds normal. She has no wheezes. She has no rales. She exhibits tenderness (bilateral parasternal ).  Abdominal: Soft. Bowel sounds are normal. She exhibits no distension and no mass. There is no tenderness.  Musculoskeletal: Normal range of motion. She exhibits no edema.  Lymphadenopathy:    She has no cervical adenopathy.  Neurological: She is alert and oriented to person, place, and time. No cranial nerve deficit. She exhibits normal muscle tone. Coordination normal.  Skin: Skin is warm and dry.  Psychiatric: She has a normal mood and affect. Her behavior is normal. Judgment and thought content normal.  Nursing note and vitals reviewed.   ED  Course  Procedures DIAGNOSTIC STUDIES:  Oxygen Saturation is 100% on RA, normal by my interpretation.    COORDINATION OF CARE:  11:23 PM Discussed treatment plan with pt at bedside and pt agreed to plan.  Labs Review Results for orders placed or performed during the hospital encounter of 16/10/96  Basic metabolic panel  Result Value Ref Range   Sodium 136 135 - 145 mmol/L   Potassium 3.2 (L) 3.5 - 5.1 mmol/L   Chloride 104 101 - 111 mmol/L   CO2 21 (L) 22 - 32 mmol/L   Glucose, Bld 174 (H) 65 - 99 mg/dL   BUN 10 6 - 20 mg/dL   Creatinine, Ser 0.77 0.44 - 1.00 mg/dL   Calcium 9.1 8.9 - 10.3 mg/dL   GFR calc non Af Amer >60 >60 mL/min   GFR calc Af Amer >60 >60 mL/min   Anion gap 11 5 - 15  CBC  Result Value Ref Range   WBC 4.8 4.0 - 10.5 K/uL   RBC 4.12 3.87 - 5.11 MIL/uL   Hemoglobin 10.7 (L) 12.0 - 15.0 g/dL   HCT 33.4 (L) 36.0 - 46.0 %   MCV 81.1 78.0 - 100.0 fL   MCH 26.0 26.0 - 34.0 pg   MCHC 32.0 30.0 - 36.0 g/dL   RDW 13.0 11.5 - 15.5 %   Platelets 166 150 - 400 K/uL  I-stat troponin, ED  Result Value Ref Range   Troponin i, poc 0.00 0.00 - 0.08 ng/mL   Comment 3           Imaging Review Dg Chest 2 View  12/08/2015  CLINICAL DATA:  Acute onset of right-sided chest pain, radiating to the back and neck. Initial encounter. EXAM: CHEST  2 VIEW COMPARISON:  Chest radiograph performed 09/04/2014 FINDINGS: The lungs are well-aerated and clear. There is no evidence of focal opacification, pleural effusion or pneumothorax. The heart is normal in size; the mediastinal contour is within normal limits. No acute osseous abnormalities are seen. Clips are noted within the right upper quadrant, reflecting prior cholecystectomy. IMPRESSION: No acute cardiopulmonary process seen. Electronically Signed   By: Garald Balding M.D.   On: 12/08/2015 22:21   I have personally reviewed and evaluated these images and lab results as part of my medical decision-making.   EKG  Interpretation   Date/Time:  Sunday December 08 2015 21:39:17 EDT Ventricular Rate:  82 PR Interval:  191 QRS Duration: 84 QT Interval:  363 QTC Calculation: 424 R Axis:   34 Text Interpretation:  Sinus rhythm Normal ECG When compared with ECG of  09/04/2014, No significant change was found Confirmed by [facility]  MD, Laresa Oshiro  ([id number]) on 12/08/2015 11:03:38 PM      MDM   Final diagnoses:  Chest wall pain  Hypokalemia  Normocytic normochromic anemia    Chest pain which, per exam, is clearly chest wall pain. ECG is normal as is a chest x-ray and troponin. She is noted to have hypokalemia and is given oral potassium. She is given a dose  of ketorolac, which did not help her pain. Decision was made to give her a single dose of dexamethasone and discharge her with a prescription for oxycodone-acetaminophen as well as K-Dur. Old records are reviewed and she has no relevant past visits.  I personally performed the services described in this documentation, which was scribed in my presence. The recorded information has been reviewed and is accurate.      Delora Fuel, MD 47/30/85 [id number]

## 2015-12-08 NOTE — ED Notes (Signed)
Taken to xray.

## 2015-12-08 NOTE — ED Notes (Signed)
Brought ems from job for c/o right sided chest pain that patient reports pain goes down the right arm and into the back and into neck.  Hx of HIV and DM.

## 2015-12-09 MED ORDER — OXYCODONE-ACETAMINOPHEN 5-325 MG PO TABS
1.0000 | ORAL_TABLET | Freq: Once | ORAL | Status: AC
  Administered 2015-12-09: 1 via ORAL
  Filled 2015-12-09: qty 1

## 2015-12-09 MED ORDER — POTASSIUM CHLORIDE CRYS ER 20 MEQ PO TBCR: 20.0000 meq | EXTENDED_RELEASE_TABLET | Freq: Two times a day (BID) | ORAL | Status: DC

## 2015-12-09 MED ORDER — DEXAMETHASONE 4 MG PO TABS
10.0000 mg | ORAL_TABLET | Freq: Once | ORAL | Status: AC
  Administered 2015-12-09: 10 mg via ORAL
  Filled 2015-12-09: qty 3

## 2015-12-09 MED ORDER — POTASSIUM CHLORIDE CRYS ER 20 MEQ PO TBCR
40.0000 meq | EXTENDED_RELEASE_TABLET | Freq: Once | ORAL | Status: AC
  Administered 2015-12-09: 40 meq via ORAL
  Filled 2015-12-09: qty 2

## 2015-12-09 MED ORDER — OXYCODONE-ACETAMINOPHEN 5-325 MG PO TABS: 1.0000 | ORAL_TABLET | ORAL | Status: DC | PRN

## 2015-12-09 NOTE — Discharge Instructions (Signed)
Chest Wall Pain Chest wall pain is pain in or around the bones and muscles of your chest. Sometimes, an injury causes this pain. Sometimes, the cause may not be known. This pain may take several weeks or longer to get better. HOME CARE INSTRUCTIONS  Pay attention to any changes in your symptoms. Take these actions to help with your pain:   Rest as told by your health care provider.   Avoid activities that cause pain. These include any activities that use your chest muscles or your abdominal and side muscles to lift heavy items.   If directed, apply ice to the painful area:  Put ice in a plastic bag.  Place a towel between your skin and the bag.  Leave the ice on for 20 minutes, 2-3 times per day.  Take over-the-counter and prescription medicines only as told by your health care provider.  Do not use tobacco products, including cigarettes, chewing tobacco, and e-cigarettes. If you need help quitting, ask your health care provider.  Keep all follow-up visits as told by your health care provider. This is important. SEEK MEDICAL CARE IF:  You have a fever.  Your chest pain becomes worse.  You have new symptoms. SEEK IMMEDIATE MEDICAL CARE IF:  You have nausea or vomiting.  You feel sweaty or light-headed.  You have a cough with phlegm (sputum) or you cough up blood.  You develop shortness of breath.   This information is not intended to replace advice given to you by your health care provider. Make sure you discuss any questions you have with your health care provider.   Document Released: 08/03/2005 Document Revised: 04/24/2015 Document Reviewed: 10/29/2014 Elsevier Interactive Patient Education 2016 [employer].  Hypokalemia Hypokalemia means that the amount of potassium in the blood is lower than normal.Potassium is a chemical, called an electrolyte, that helps regulate the amount of fluid in the body. It also stimulates muscle contraction and helps nerves function  properly.Most of the body's potassium is inside of cells, and only a very small amount is in the blood. Because the amount in the blood is so small, minor changes can be life-threatening. CAUSES  Antibiotics.  Diarrhea or vomiting.  Using laxatives too much, which can cause diarrhea.  Chronic kidney disease.  Water pills (diuretics).  Eating disorders (bulimia).  Low magnesium level.  Sweating a lot. SIGNS AND SYMPTOMS  Weakness.  Constipation.  Fatigue.  Muscle cramps.  Mental confusion.  Skipped heartbeats or irregular heartbeat (palpitations).  Tingling or numbness. DIAGNOSIS  Your health care provider can diagnose hypokalemia with blood tests. In addition to checking your potassium level, your health care provider may also check other lab tests. TREATMENT Hypokalemia can be treated with potassium supplements taken by mouth or adjustments in your current medicines. If your potassium level is very low, you may need to get potassium through a vein (IV) and be monitored in the hospital. A diet high in potassium is also helpful. Foods high in potassium are:  Nuts, such as peanuts and pistachios.  Seeds, such as sunflower seeds and pumpkin seeds.  Peas, lentils, and lima beans.  Whole grain and bran cereals and breads.  Fresh fruit and vegetables, such as apricots, avocado, bananas, cantaloupe, kiwi, oranges, tomatoes, asparagus, and potatoes.  Orange and tomato juices.  Red meats.  Fruit yogurt. HOME CARE INSTRUCTIONS  Take all medicines as prescribed by your health care provider.  Maintain a healthy diet by including nutritious food, such as fruits, vegetables, nuts, whole grains,  and lean meats.  If you are taking a laxative, be sure to follow the directions on the label. SEEK MEDICAL CARE IF:  Your weakness gets worse.  You feel your heart pounding or racing.  You are vomiting or having diarrhea.  You are diabetic and having trouble keeping your  blood glucose in the normal range. SEEK IMMEDIATE MEDICAL CARE IF:  You have chest pain, shortness of breath, or dizziness.  You are vomiting or having diarrhea for more than 2 days.  You faint. MAKE SURE YOU:   Understand these instructions.  Will watch your condition.  Will get help right away if you are not doing well or get worse.   This information is not intended to replace advice given to you by your health care provider. Make sure you discuss any questions you have with your health care provider.   Document Released: 08/03/2005 Document Revised: 08/24/2014 Document Reviewed: 02/03/2013 Elsevier Interactive Patient Education 2016 Else[employer] Inc.  Acetaminophen; Oxycodone tablets What is this medicine? ACETAMINOPHEN; OXYCODONE (a set a MEE noe fen; ox i KOE done) is a pain reliever. It is used to treat moderate to severe pain. This medicine may be used for other purposes; ask your health care provider or pharmacist if you have questions. What should I tell my health care provider before I take this medicine? They need to know if you have any of these conditions: -brain tumor -Crohn's disease, inflammatory bowel disease, or ulcerative colitis -drug abuse or addiction -head injury -heart or circulation problems -if you often drink alcohol -kidney disease or problems going to the bathroom -liver disease -lung disease, asthma, or breathing problems -an unusual or allergic reaction to acetaminophen, oxycodone, other opioid analgesics, other medicines, foods, dyes, or preservatives -pregnant or trying to get pregnant -breast-feeding How should I use this medicine? Take this medicine by mouth with a full glass of water. Follow the directions on the prescription label. You can take it with or without food. If it upsets your stomach, take it with food. Take your medicine at regular intervals. Do not take it more often than directed. Talk to your pediatrician regarding the use of  this medicine in children. Special care may be needed. Patients over 77 years old may have a stronger reaction and need a smaller dose. Overdosage: If you think you have taken too much of this medicine contact a poison control center or emergency room at once. NOTE: This medicine is only for you. Do not share this medicine with others. What if I miss a dose? If you miss a dose, take it as soon as you can. If it is almost time for your next dose, take only that dose. Do not take double or extra doses. What may interact with this medicine? -alcohol -antihistamines -barbiturates like amobarbital, butalbital, butabarbital, methohexital, pentobarbital, phenobarbital, thiopental, and secobarbital -benztropine -drugs for bladder problems like solifenacin, trospium, oxybutynin, tolterodine, hyoscyamine, and methscopolamine -drugs for breathing problems like ipratropium and tiotropium -drugs for certain stomach or intestine problems like propantheline, homatropine methylbromide, glycopyrrolate, atropine, belladonna, and dicyclomine -general anesthetics like etomidate, ketamine, nitrous oxide, propofol, desflurane, enflurane, halothane, isoflurane, and sevoflurane -medicines for depression, anxiety, or psychotic disturbances -medicines for sleep -muscle relaxants -naltrexone -narcotic medicines (opiates) for pain -phenothiazines like perphenazine, thioridazine, chlorpromazine, mesoridazine, fluphenazine, prochlorperazine, promazine, and trifluoperazine -scopolamine -tramadol -trihexyphenidyl This list may not describe all possible interactions. Give your health care provider a list of all the medicines, herbs, non-prescription drugs, or dietary supplements you use. Also tell  them if you smoke, drink alcohol, or use illegal drugs. Some items may interact with your medicine. What should I watch for while using this medicine? Tell your doctor or health care professional if your pain does not go away, if  it gets worse, or if you have new or a different type of pain. You may develop tolerance to the medicine. Tolerance means that you will need a higher dose of the medication for pain relief. Tolerance is normal and is expected if you take this medicine for a long time. Do not suddenly stop taking your medicine because you may develop a severe reaction. Your body becomes used to the medicine. This does NOT mean you are addicted. Addiction is a behavior related to getting and using a drug for a non-medical reason. If you have pain, you have a medical reason to take pain medicine. Your doctor will tell you how much medicine to take. If your doctor wants you to stop the medicine, the dose will be slowly lowered over time to avoid any side effects. You may get drowsy or dizzy. Do not drive, use machinery, or do anything that needs mental alertness until you know how this medicine affects you. Do not stand or sit up quickly, especially if you are an older patient. This reduces the risk of dizzy or fainting spells. Alcohol may interfere with the effect of this medicine. Avoid alcoholic drinks. There are different types of narcotic medicines (opiates) for pain. If you take more than one type at the same time, you may have more side effects. Give your health care provider a list of all medicines you use. Your doctor will tell you how much medicine to take. Do not take more medicine than directed. Call emergency for help if you have problems breathing. The medicine will cause constipation. Try to have a bowel movement at least every 2 to 3 days. If you do not have a bowel movement for 3 days, call your doctor or health care professional. Do not take Tylenol (acetaminophen) or medicines that have acetaminophen with this medicine. Too much acetaminophen can be very dangerous. Many nonprescription medicines contain acetaminophen. Always read the labels carefully to avoid taking more acetaminophen. What side effects may I  notice from receiving this medicine? Side effects that you should report to your doctor or health care professional as soon as possible: -allergic reactions like skin rash, itching or hives, swelling of the face, lips, or tongue -breathing difficulties, wheezing -confusion -light headedness or fainting spells -severe stomach pain -unusually weak or tired -yellowing of the skin or the whites of the eyes Side effects that usually do not require medical attention (report to your doctor or health care professional if they continue or are bothersome): -dizziness -drowsiness -nausea -vomiting This list may not describe all possible side effects. Call your doctor for medical advice about side effects. You may report side effects to FDA at 1-800-FDA-1088. Where should I keep my medicine? Keep out of the reach of children. This medicine can be abused. Keep your medicine in a safe place to protect it from theft. Do not share this medicine with anyone. Selling or giving away this medicine is dangerous and against the law. This medicine may cause accidental overdose and death if it taken by other adults, children, or pets. Mix any unused medicine with a substance like cat litter or coffee grounds. Then throw the medicine away in a sealed container like a sealed bag or a coffee can with a lid.  Do not use the medicine after the expiration date. Store at room temperature between 20 and 25 degrees C (68 and 77 degrees F). NOTE: This sheet is a summary. It may not cover all possible information. If you have questions about this medicine, talk to your doctor, pharmacist, or health care provider.    2016, Elsevier/Gold Standard. (2014-07-04 15:18:46)  Potassium Salts tablets, extended-release tablets or capsules What is this medicine? POTASSIUM (poe TASS i um) is a natural salt that is important for the heart, muscles, and nerves. It is found in many foods and is normally supplied by a well balanced diet.  This medicine is used to treat low potassium. This medicine may be used for other purposes; ask your health care provider or pharmacist if you have questions. What should I tell my health care provider before I take this medicine? They need to know if you have any of these conditions: -Addison's disease -dehydration -diabetes -difficulty swallowing -heart disease -history of high levels of potassium in the blood -irregular heartbeat -kidney disease -recent severe burn -stomach ulcers or other stomach problems -an unusual or allergic reaction to potassium, tartrazine, other medicines, foods, dyes, or preservatives -pregnant or trying to get pregnant -breast-feeding How should I use this medicine? Take this medicine by mouth with a full glass of water. Take with food. Follow the directions on the prescription label. Do not suck on, crush, or chew this medicine. If you have difficulty swallowing, ask the pharmacist how to take. Take your medicine at regular intervals. Do not take it more often than directed. Do not stop taking except on your doctor's advice. Talk to your pediatrician regarding the use of this medicine in children. Special care may be needed. Overdosage: If you think you have taken too much of this medicine contact a poison control center or emergency room at once. NOTE: This medicine is only for you. Do not share this medicine with others. What if I miss a dose? If you miss a dose, take it as soon as you can. If it is almost time for your next dose, take only that dose. Do not take double or extra doses. What may interact with this medicine? Do not take this medicine with any of the following medications: -eplerenone -certain medicines for stomach problems like atropine; difenoxin and glycopyrrolate -sodium polystyrene sulfonate This medicine may also interact with the following medications: -certain medicines for blood pressure or heart disease like lisinopril, losartan,  quinapril, valsartan -medicines for cold or allergies -NSAIDs, medicines for pain and inflammation, like ibuprofen or napoxen -other potassium supplements -salt substitutes -some diuretics This list may not describe all possible interactions. Give your health care provider a list of all the medicines, herbs, non-prescription drugs, or dietary supplements you use. Also tell them if you smoke, drink alcohol, or use illegal drugs. Some items may interact with your medicine. What should I watch for while using this medicine? Visit your doctor or health care professional for regular check ups. You will need lab work done regularly. You may need to be on a special diet while taking this medicine. Ask your doctor. What side effects may I notice from receiving this medicine? Side effects that you should report to your doctor or health care professional as soon as possible: -allergic reactions like skin rash, itching or hives, swelling of the face, lips, or tongue -anxious -black, tarry stools -breathing problems -confusion -heartburn -irregular heartbeat -numbness or tingling in hands or feet -pain when swallowing -unusually weak or  tired -weakness, heaviness of legs Side effects that usually do not require medical attention (report to your doctor or health care professional if they continue or are bothersome): -diarrhea -nausea -upset stomach -vomiting This list may not describe all possible side effects. Call your doctor for medical advice about side effects. You may report side effects to FDA at 1-800-FDA-1088. Where should I keep my medicine? Keep out of the reach of children. Store at room temperature between 15 and 30 degrees C (59 and 86 degrees F ). Keep bottle closed tightly to protect this medicine from light and moisture. Throw away any unused medicine after the expiration date. NOTE: This sheet is a summary. It may not cover all possible information. If you have questions about  this medicine, talk to your doctor, pharmacist, or health care provider.    2016, Elsevier/Gold Standard. (2015-01-10 08:55:21)

## 2016-01-21 ENCOUNTER — Other Ambulatory Visit: Payer: Medicaid Other

## 2016-02-11 ENCOUNTER — Ambulatory Visit: Payer: Medicaid Other | Admitting: Internal Medicine

## 2016-02-11 ENCOUNTER — Encounter: Payer: Self-pay | Admitting: *Deleted

## 2016-03-03 NOTE — Addendum Note (Signed)
Addended by: Orson Gear on: 03/03/2016 03:10 PM   Modules accepted: Orders

## 2016-03-30 ENCOUNTER — Other Ambulatory Visit (HOSPITAL_COMMUNITY)
Admission: RE | Admit: 2016-03-30 | Discharge: 2016-03-30 | Disposition: A | Discharge status: Home / Self Care | Payer: Medicaid Other | Source: Ambulatory Visit | Attending: Internal Medicine | Admitting: Internal Medicine

## 2016-03-30 ENCOUNTER — Other Ambulatory Visit: Payer: Medicaid Other

## 2016-03-30 DIAGNOSIS — Z113 Encounter for screening for infections with a predominantly sexual mode of transmission: Secondary | ICD-10-CM | POA: Diagnosis present

## 2016-03-30 DIAGNOSIS — B20 Human immunodeficiency virus [HIV] disease: Secondary | ICD-10-CM

## 2016-03-30 LAB — CBC WITH DIFFERENTIAL/PLATELET
Basophils Absolute: 0 cells/uL (ref 0–200)
Basophils Relative: 0 %
Eosinophils Absolute: 42 cells/uL (ref 15–500)
Eosinophils Relative: 1 %
HEMATOCRIT: 35.1 % (ref 35.0–45.0)
Hemoglobin: 11.3 g/dL — ABNORMAL LOW (ref 11.7–15.5)
LYMPHS ABS: 1680 {cells}/uL (ref 850–3900)
MCH: 26.8 pg — ABNORMAL LOW (ref 27.0–33.0)
MCHC: 32.2 g/dL (ref 32.0–36.0)
MCV: 83.2 fL (ref 80.0–100.0)
MONO ABS: 420 {cells}/uL (ref 200–950)
MPV: 10.1 fL (ref 7.5–12.5)
Monocytes Relative: 10 %
NEUTROS ABS: 2058 {cells}/uL (ref 1500–7800)
Neutrophils Relative %: 49 %
PLATELETS: 156 10*3/uL (ref 140–400)
RBC: 4.22 MIL/uL (ref 3.80–5.10)
RDW: 14 % (ref 11.0–15.0)
WBC: 4.2 10*3/uL (ref 3.8–10.8)

## 2016-03-31 LAB — HIV-1 RNA QUANT-NO REFLEX-BLD
HIV 1 RNA Quant: <20 copies/mL (ref <20)
HIV-1 RNA Quant, Log: <1.3 Log copies/mL (ref <1.30)

## 2016-03-31 LAB — COMPLETE METABOLIC PANEL WITH GFR
ALBUMIN: 3.7 g/dL (ref 3.6–5.1)
ALK PHOS: 76 U/L (ref 33–115)
ALT: 19 U/L (ref 6–29)
AST: 21 U/L (ref 10–30)
BUN: 10 mg/dL (ref 7–25)
CALCIUM: 8.8 mg/dL (ref 8.6–10.2)
CO2: 25 mmol/L (ref 20–31)
CREATININE: 0.79 mg/dL (ref 0.50–1.10)
Chloride: 104 mmol/L (ref 98–110)
GFR, Est African American: >89 mL/min (ref >=60)
GFR, Est Non African American: >89 mL/min (ref >=60)
GLUCOSE: 164 mg/dL — AB (ref 65–99)
Potassium: 4.4 mmol/L (ref 3.5–5.3)
SODIUM: 135 mmol/L (ref 135–146)
TOTAL PROTEIN: 7.3 g/dL (ref 6.1–8.1)
Total Bilirubin: 0.3 mg/dL (ref 0.2–1.2)

## 2016-03-31 LAB — T-HELPER CELL (CD4) - (RCID CLINIC ONLY)
CD4 % Helper T Cell: 36 % (ref 33–55)
CD4 T CELL ABS: 530 /uL (ref 400–2700)

## 2016-03-31 LAB — URINE CYTOLOGY ANCILLARY ONLY
Chlamydia: NEGATIVE
Neisseria Gonorrhea: NEGATIVE

## 2016-03-31 LAB — RPR

## 2016-04-06 ENCOUNTER — Telehealth: Payer: Self-pay

## 2016-04-06 NOTE — Telephone Encounter (Signed)
Patient called wanting to know and explain the results of her test performed on 08/14. Patient stated that she saw the results on Mychart but did not understand why the results stated she needed more testing. Explained to patient that her viral load is less than 20 and undetected by the test and the message she sees is a disclaimer they put with results that were undetected. Reminded patient that appointment was on 09/06 and to keep appointment with Dr. Linus Salmons so he can answer any other question that may arise before then. Patient verbalized understand and she will keep appointment. Rodman Key, LPN

## 2016-04-22 ENCOUNTER — Ambulatory Visit (INDEPENDENT_AMBULATORY_CARE_PROVIDER_SITE_OTHER): Payer: Medicaid Other | Admitting: Internal Medicine

## 2016-04-22 ENCOUNTER — Encounter: Payer: Self-pay | Admitting: Internal Medicine

## 2016-04-22 VITALS — BP 120/85 | HR 89 | Temp 98.2°F | Ht 67.0 in | Wt 275.0 lb

## 2016-04-22 DIAGNOSIS — Z113 Encounter for screening for infections with a predominantly sexual mode of transmission: Secondary | ICD-10-CM | POA: Diagnosis not present

## 2016-04-22 DIAGNOSIS — B20 Human immunodeficiency virus [HIV] disease: Secondary | ICD-10-CM | POA: Diagnosis present

## 2016-04-22 NOTE — Assessment & Plan Note (Signed)
Negative screening.

## 2016-04-22 NOTE — Progress Notes (Signed)
Patient ID: Beverly Johnson, female   DOB: October 15, 1975, 40 y.o.   MRN: [id number] CC: Follow up for HIV  Interval history: Currently is asymptomatic and well-controlled on Genvoya, which she started last year, changing from [place].  Since last visit she has had no new issues, continues to see her PCP and work on her diabetes, last A1C 8.6.   Has no associated n/v.  Denies any missed doses.     Prior to Admission medications   Medication Sig Start Date End Date Taking? Authorizing Provider  GENVOYA 150-150-200-10 MG TABS tablet TAKE 1 TABLET BY MOUTH DAILY WITH BREAKFAST. 04/17/15   Thayer Headings, MD  metFORMIN (GLUCOPHAGE) 500 MG tablet Take 1 tablet (500 mg total) by mouth daily with breakfast. 05/29/15 05/28/16  Liberty Handy, MD    Review of Systems Constitutional: negative for fatigue Gastrointestinal: negative for nausea and vomiting Musculoskeletal: negative for myalgias and arthralgias All other systems reviewed and are negative   Physical Exam: CONSTITUTIONAL:in no apparent distress and alert  Vitals:   04/22/16 1450  BP: 120/85  Pulse: 89  Temp: 98.2 F (36.8 C)   Eyes: anicteric HENT: no thrush, no cervical lymphadenopathy Respiratory: Normal respiratory effort; CTA B Psych: flat affect  Lab Results  Component Value Date   HIV1RNAQUANT <20 03/30/2016   HIV1RNAQUANT <20 07/02/2015   HIV1RNAQUANT <20 12/04/2014

## 2016-04-22 NOTE — Assessment & Plan Note (Signed)
Doing well.  Continue with the same and RTC 6 months.

## 2016-10-20 ENCOUNTER — Other Ambulatory Visit: Payer: Self-pay | Admitting: Internal Medicine

## 2016-11-09 NOTE — Progress Notes (Signed)
   CC: follow up of diabetes    HPI: [name] is a 41 y.o. with past medical history of type 2 diabetes mellitus, morbid obesity, and HIV who presents to clinic for follow up of type 2 diabetes and morbid obesity.   Please see problem list for status of the pt's chronic medical problems.  Past Medical History:  Diagnosis Date  . Diabetes mellitus without complication ([place])   . Gallstones 03/10/2011  . HIV infection ([place])   . Immune deficiency disorder ([place])     Review of Systems:  Please see each problem below for a pertinent review of systems.  Physical Exam:  Vitals:   11/10/16 1347  BP: 136/75  Pulse: 91  Temp: 98.7 F (37.1 C)  TempSrc: Oral  SpO2: 100%  Weight: 277 lb 12.8 oz (126 kg)  Height: 5\' 7"  (1.702 m)   Physical Exam  Constitutional: She is oriented to person, place, and time. She appears well-developed and well-nourished. No distress.  HENT:  Head: Normocephalic and atraumatic.  Eyes: Conjunctivae are normal. No scleral icterus.  Neck: Normal range of motion.  Cardiovascular: Normal rate and regular rhythm.   No murmur heard. Pulmonary/Chest: Effort normal. No respiratory distress. She has no wheezes. She has no rales.  Abdominal: Soft. She exhibits no distension. There is no tenderness. There is no guarding.  Neurological: She is alert and oriented to person, place, and time.  Skin: Skin is warm and dry. She is not diaphoretic.  Psychiatric: She has a normal mood and affect. Her behavior is normal.     Assessment & Plan:   See Encounters Tab for problem based charting.  Health care maintenance Epic reflect that she is due for pap smear however she is in a clinical trial and has had pap smears at 6 month intervals. She reports the results of these have all been normal. She will attempt to have the results sent to our office.  She has declined pneumococcal and influenza vaccination today  Lipid panel today  A1c today   T2 DM  Taking  metformin 500 mg BID, A1c today 7.9 with goal for A1c 7. She has been prescribed metformin 1000 mg BID but found these pills were too big so she has instead been taking 500 mg BID. I believe she could have improvement in her A1c if she begins taking two 500 mg tablets twice daily and she is in agreement with this plan. She denies GI upset.  Last eye exam 11/2015 : no diabetic retinopathy  Last urine microalbumin/creatinine 11/2015: 4.4  -increase to metformin 1000 mg BID -follow up A1c today  -Last lipid pane 10/2014 LDL 65, follow up screening today  -RTC in 3 months, if her A1c is not at goal I will consider adding victoza to aid in weight loss and A1c reduction  Morbid obesity  She has been working on diet and exercise control. She continues to eat fried food and drink 2% milk but has switched from white bread to wheat bread. She does consider herself to have an active lifestyle and says that she walks a lot.  - encouraged follow up with [facility]   HIV  Reports good compliance with Genoya. Follows with Dr. Linus Salmons. Last CD4 count 03/2016 530, viral RNA <20.  - ID is following, I appreciate their recommendations  Patient discussed with Dr. Evette Doffing

## 2016-11-10 ENCOUNTER — Encounter: Payer: Self-pay | Admitting: Internal Medicine

## 2016-11-10 ENCOUNTER — Ambulatory Visit (INDEPENDENT_AMBULATORY_CARE_PROVIDER_SITE_OTHER): Payer: Medicaid Other | Admitting: Internal Medicine

## 2016-11-10 VITALS — BP 136/75 | HR 91 | Temp 98.7°F | Ht 67.0 in | Wt 277.8 lb

## 2016-11-10 DIAGNOSIS — Z13220 Encounter for screening for lipoid disorders: Secondary | ICD-10-CM | POA: Diagnosis not present

## 2016-11-10 DIAGNOSIS — Z21 Asymptomatic human immunodeficiency virus [HIV] infection status: Secondary | ICD-10-CM

## 2016-11-10 DIAGNOSIS — Z6841 Body Mass Index (BMI) 40.0 and over, adult: Secondary | ICD-10-CM | POA: Diagnosis not present

## 2016-11-10 DIAGNOSIS — Z7984 Long term (current) use of oral hypoglycemic drugs: Secondary | ICD-10-CM | POA: Diagnosis not present

## 2016-11-10 DIAGNOSIS — E119 Type 2 diabetes mellitus without complications: Secondary | ICD-10-CM

## 2016-11-10 DIAGNOSIS — Z79899 Other long term (current) drug therapy: Secondary | ICD-10-CM

## 2016-11-10 LAB — GLUCOSE, CAPILLARY: Glucose-Capillary: 326 mg/dL — ABNORMAL HIGH (ref 65–99)

## 2016-11-10 LAB — POCT GLYCOSYLATED HEMOGLOBIN (HGB A1C): Hemoglobin A1C: 7.9

## 2016-11-10 NOTE — Patient Instructions (Signed)
It was a pleasure to meet you today [name],   Today we spoke about your diabetes,  Start taking your metformin 500 mg as two tablets twice daily, I think this will help to lower your blood sugar  Keep up the good work with diet and exercise!   Please have your pap smear results sent to our clinic  Please schedule a follow up appointment with me in 3 months   Eating Healthy on a Budget There are many ways to save money at the grocery store and continue to eat healthy. You can be successful if you plan your meals according to your budget, purchase according to your budget and grocery list, and prepare food yourself. How can I buy more food on a limited budget? Plan   Plan meals and snacks according to a grocery list and budget you create.  Look for recipes where you can cook once and make enough food for two meals.  Include meals that will "stretch" more expensive foods such as stews, casseroles, and stir-fry dishes.  Make a grocery list and make sure to bring it with you to the store. If you have a smart phone, you could use your phone to create your shopping list. Purchase   When grocery shopping, buy only the items on your grocery list and go only to the areas of the store that have the items on your list. Prepare   Some meal items can be prepared in advance. Pre-cook on days when you have extra time.  Make extra food (such as by doubling recipes) and freeze the extras in meal-sized containers or in individual portions for fast meals and snacks.  Use leftovers in your meal plan for the week.  Try some meatless meals or try "no cook" meals like salads.  When you come home from the grocery store, wash and prepare your fruits and vegetables so they are ready to use and eat. This will help reduce food waste. How can I buy more food on a limited budget? Try these tips the next time you go shopping:  [place] store brands or generic brands.  Use coupons only for foods and brands  you normally buy. Avoid buying items you wouldn't normally buy simply because they are on sale.  Check online and in newspapers for weekly deals.  Buy healthy items from the bulk bins when available, such as herbs, spices, flours, pastas, nuts, and dried fruit.  Buy fruits and vegetables that are in season. Prices are usually lower on in-season produce.  Compare and contrast different items. You can do this by looking at the unit price on the price tag. Use it to compare different brands and sizes to find out which item is the best deal.  Choose naturally low-cost healthy items, such as carrots, potatoes, apples, bananas, and oranges. Dried or canned beans are a low-cost protein source.  Buy in bulk and freeze extra food. Items you can buy in bulk include meats, fish, poultry, frozen fruits, and frozen vegetables.  Limit the purchase of prepared or "ready-to-eat" foods, such as pre-cut fruits and vegetables and pre-made salads.  If possible, shop around to discover which grocery store offers the best prices. Some stores charge much more than other stores for the same items.  Do not shop when you are hungry. If you shop while hungry, It may be hard to stick to your list and budget.  Stick to your list and resist impulse buys. Treat your list as your official  plan for the week.  Buy a variety of vegetables and fruit by purchasing fresh, frozen, and canned items.  Look beyond eye level. Foods at eye level (adult or child eye level) are more expensive. Look at the top and bottom shelves for deals.  Be efficient with your time when shopping. The more time you spend at the store, the more money you are likely to spend.  Consider other retailers such as dollar stores, larger [employer], local fruit and vegetable stands, and farmers markets. What are some tips for less expensive food substitutions? When choosing more expensive foods like meats and dairy, try these tips to save  money:  Choose cheaper cuts of meat, such as bone-in chicken thighs and drumsticks instead skinless and boneless chicken. When you are ready to prepare the chicken, you can remove the skin yourself to make it healthier.  Choose lean meats like chicken or [place]. When choosing ground beef, make sure it is lean ground beef (92% lean, 8% fat). If you do buy a fattier ground beef, drain the fat before eating.  Buy dried beans and peas, such as lentils, split peas, or kidney beans.  For seafood, choose canned tuna, salmon, or sardines.  Eggs are a low-cost source of protein.  Buy the larger tubs of yogurt instead of individual-sized containers.  Choose water instead of sodas and other sweetened beverages.  Skip buying chips, cookies, and other "junk food". These items are usually expensive, high in calories, and low in nutritional value. How can I prepare the foods I buy in the healthiest way? Practice these tips for cooking foods in the healthiest way to reduce excess fat and calorie intake:  Steam, saute, grill, or bake foods instead of frying them.  Make sure half your plate is filled with fruits or vegetables. Choose from fresh, frozen, or canned fruits and vegetables. If eating canned, remember to rinse them before eating. This will remove any excess salt added for packaging.  Trim all fat from meat before cooking. Remove the skin from chicken or [place].  Spoon off fat from meat dishes once they have been chilled in the refrigerator and the fat has hardened on the top.  Use skim milk, low-fat milk, or evaporated skim milk when making cream sauces, soups, or puddings.  Substitute low-fat yogurt, sour cream, or cottage cheese for sour cream and mayonnaise in dips and dressings.  Try lemon juice, herbs, or spices to season food instead of salt, butter, or margarine. This information is not intended to replace advice given to you by your health care provider. Make sure you discuss any  questions you have with your health care provider. Document Released: 04/06/2014 Document Revised: 02/21/2016 Document Reviewed: 03/06/2014 Elsevier Interactive Patient Education  2017 [employer].

## 2016-11-11 ENCOUNTER — Telehealth: Payer: Self-pay | Admitting: Dietician

## 2016-11-11 LAB — LIPID PANEL
CHOLESTEROL TOTAL: 129 mg/dL (ref 100–199)
Chol/HDL Ratio: 2.9 ratio units (ref 0.0–4.4)
HDL: 44 mg/dL (ref >39)
LDL CALC: 54 mg/dL (ref 0–99)
TRIGLYCERIDES: 153 mg/dL — AB (ref 0–149)
VLDL Cholesterol Cal: 31 mg/dL (ref 5–40)

## 2016-11-11 NOTE — Telephone Encounter (Signed)
I agree, thank you.

## 2016-11-11 NOTE — Telephone Encounter (Signed)
Left message for return call.

## 2016-11-11 NOTE — Telephone Encounter (Signed)
Patient returned call and was hesitant to schedule an appointment. She questioned why she needed an appointment when everything was fine at her visit. I encouraged her that is was a nutrition check up (like a yearly doctor's check up) and that Dr. Hetty Ely was following recommendations for good care.  She agreed to an appointment on April 12th at 2 PM.

## 2016-11-12 NOTE — Addendum Note (Signed)
Addended by: Lalla Brothers T on: 11/12/2016 02:43 PM   Modules accepted: Level of Service

## 2016-11-12 NOTE — Progress Notes (Signed)
Internal Medicine Clinic Attending  Case discussed with Dr. Blum at the time of the visit.  We reviewed the resident's history and exam and pertinent patient test results.  I agree with the assessment, diagnosis, and plan of care documented in the resident's note. 

## 2016-11-26 ENCOUNTER — Ambulatory Visit: Payer: Medicaid Other | Admitting: Dietician

## 2017-01-21 ENCOUNTER — Other Ambulatory Visit: Payer: Medicaid Other

## 2017-01-22 ENCOUNTER — Ambulatory Visit (INDEPENDENT_AMBULATORY_CARE_PROVIDER_SITE_OTHER): Payer: Medicaid Other | Admitting: Internal Medicine

## 2017-01-22 ENCOUNTER — Encounter: Payer: Self-pay | Admitting: Internal Medicine

## 2017-01-22 VITALS — BP 138/82 | HR 89 | Temp 98.3°F | Ht 67.0 in | Wt 275.7 lb

## 2017-01-22 DIAGNOSIS — E669 Obesity, unspecified: Secondary | ICD-10-CM | POA: Diagnosis not present

## 2017-01-22 DIAGNOSIS — E119 Type 2 diabetes mellitus without complications: Secondary | ICD-10-CM | POA: Diagnosis not present

## 2017-01-22 DIAGNOSIS — Z21 Asymptomatic human immunodeficiency virus [HIV] infection status: Secondary | ICD-10-CM | POA: Diagnosis not present

## 2017-01-22 DIAGNOSIS — R252 Cramp and spasm: Secondary | ICD-10-CM | POA: Insufficient documentation

## 2017-01-22 MED ORDER — B COMPLEX VITAMINS PO CAPS: 1.0000 | ORAL_CAPSULE | Freq: Every day | ORAL | Status: DC

## 2017-01-22 NOTE — Progress Notes (Signed)
   CC: "My legs have been cramping for the last month."  HPI:  Beverly Johnson is a 41 y.o. woman with history of diabetes (A1c 7.9%), obesity, HIV (CD4 530, VL undetectable) who presents for evaluation of painful leg cramps.  For the past month she's been noticing painful cramps in both calves and occasionally the back of her thighs.   The cramping is worst at night and first thing in the morning, but she also has some symptoms during the day. She can feel the muscle tightening up the time of the pain.  When she is not having a cramp, she doesn't have any pain or tenderness in the legs. She has not noticed any swelling in her legs or feet, or shortness of breath or chest pain.  Nothing changed acutely in the last week, but she decided to come to the doctor because it seemed to be getting any better.  She otherwise feels well.   Past Medical History:  Diagnosis Date  . Diabetes mellitus without complication ([place])   . Gallstones 03/10/2011  . HIV infection ([place])   . Immune deficiency disorder ([place])     Review of Systems:  Review of Systems  Constitutional: Negative for chills and fever.  Respiratory: Negative for cough and shortness of breath.   Cardiovascular: Negative for chest pain and leg swelling.  Musculoskeletal: Positive for myalgias.  Skin: Negative for rash.  Neurological: Negative for speech change and focal weakness.    Physical Exam:  Vitals:   01/22/17 1501  BP: 138/82  Pulse: 89  Temp: 98.3 F (36.8 C)  TempSrc: Oral  SpO2: 100%  Weight: 275 lb 11.2 oz (125.1 kg)  Height: 5\' 7"  (1.702 m)   Physical Exam  Constitutional: She is oriented to person, place, and time.  Obese woman in no distress  Cardiovascular: Normal rate and regular rhythm.   Pulmonary/Chest: Effort normal and breath sounds normal.  Musculoskeletal:  Increased tone in bilateral calves Tenderness to deep palpation over bilateral soleus Pain in calf with active and passive dorsiflexion  bilaterally symmetric No edema, erythema, deformity No tenderness to palpation of the gastrocnemius, thighs, or  Neurological: She is alert and oriented to person, place, and time.  Skin: Skin is warm and dry.  Psychiatric: She has a normal mood and affect. Her behavior is normal.    Assessment & Plan:   See Encounters Tab for problem based charting.  Patient discussed with Dr. Dareen Piano

## 2017-01-22 NOTE — Assessment & Plan Note (Addendum)
Bilateral calf cramping worst at night is consistent with benign nocturnal leg cramps. She has had no vomiting or diarrhea and is not on a diuretic, she could have an underlying electrolyte abnormality. I have very low suspicion for myopathy given lack of weakness, muscle belly tenderness, and distribution of involvement. With the symmetric involvement, lack of edema, and lack of cardiopulmonary symptoms I do not think she has DVT.  -Check CMP -B complex vitamin -Stretching, massage, heat

## 2017-01-22 NOTE — Patient Instructions (Addendum)
I am not sure exactly why her legs have been cramping. While it is very uncomfortable, I do not think anything dangerous is going on. I would like to check some blood tests in order to see if your levels of potassium or calcium could be off, which can cause leg cramps.  I recommend gentle stretching of your calves much has your pain can tolerate.  You may also find that massaging them, and using heat packs may help. Make sure you drink plenty of water.  I also recommend starting to take a B complex vitamin daily, as these can sometimes help with leg cramps.  Leg Cramps Leg cramps occur when a muscle or muscles tighten and you have no control over this tightening (involuntary muscle contraction). Muscle cramps can develop in any muscle, but the most common place is in the calf muscles of the leg. Those cramps can occur during exercise or when you are at rest. Leg cramps are painful, and they may last for a few seconds to a few minutes. Cramps may return several times before they finally stop. Usually, leg cramps are not caused by a serious medical problem. In many cases, the cause is not known. Some common causes include:  Overexertion.  Overuse from repetitive motions, or doing the same thing over and over.  Remaining in a certain position for a long period of time.  Improper preparation, form, or technique while performing a sport or an activity.  Dehydration.  Injury.  Side effects of some medicines.  Abnormally low levels of the salts and ions in your blood (electrolytes), especially potassium and calcium. These levels could be low if you are taking water pills (diuretics) or if you are pregnant.  Follow these instructions at home: Watch your condition for any changes. Taking the following actions may help to lessen any discomfort that you are feeling:  Stay well-hydrated. Drink enough fluid to keep your urine clear or pale yellow.  Try massaging, stretching, and relaxing the  affected muscle. Do this for several minutes at a time.  For tight or tense muscles, use a warm towel, heating pad, or hot shower water directed to the affected area.  If you are sore or have pain after a cramp, applying ice to the affected area may relieve discomfort. ? Put ice in a plastic bag. ? Place a towel between your skin and the bag. ? Leave the ice on for 20 minutes, 2-3 times per day.  Avoid strenuous exercise for several days if you have been having frequent leg cramps.  Make sure that your diet includes the essential minerals for your muscles to work normally.  Take medicines only as directed by your health care provider.  Contact a health care provider if:  Your leg cramps get more severe or more frequent, or they do not improve over time.  Your foot becomes cold, numb, or blue. This information is not intended to replace advice given to you by your health care provider. Make sure you discuss any questions you have with your health care provider. Document Released: 09/10/2004 Document Revised: 01/09/2016 Document Reviewed: 07/11/2014 Elsevier Interactive Patient Education  [employer].

## 2017-01-23 ENCOUNTER — Telehealth: Payer: Self-pay | Admitting: Pulmonary Disease

## 2017-01-23 LAB — CMP14 + ANION GAP
A/G RATIO: 1.2 (ref 1.2–2.2)
ALT: 28 IU/L (ref 0–32)
ANION GAP: 14 mmol/L (ref 10.0–18.0)
AST: 47 IU/L — ABNORMAL HIGH (ref 0–40)
Albumin: 4 g/dL (ref 3.5–5.5)
Alkaline Phosphatase: 135 IU/L — ABNORMAL HIGH (ref 39–117)
BILIRUBIN TOTAL: 0.3 mg/dL (ref 0.0–1.2)
BUN / CREAT RATIO: 10 (ref 9–23)
BUN: 8 mg/dL (ref 6–20)
CO2: 21 mmol/L (ref 18–29)
Calcium: 9.5 mg/dL (ref 8.7–10.2)
Chloride: 96 mmol/L (ref 96–106)
Creatinine, Ser: 0.83 mg/dL (ref 0.57–1.00)
GFR, EST AFRICAN AMERICAN: 103 mL/min/{1.73_m2} (ref >59)
GFR, EST NON AFRICAN AMERICAN: 89 mL/min/{1.73_m2} (ref >59)
GLOBULIN, TOTAL: 3.4 g/dL (ref 1.5–4.5)
Glucose: 510 mg/dL (ref 65–99)
POTASSIUM: 4.2 mmol/L (ref 3.5–5.2)
Sodium: 131 mmol/L — ABNORMAL LOW (ref 134–144)
Total Protein: 7.4 g/dL (ref 6.0–8.5)

## 2017-01-23 NOTE — Telephone Encounter (Signed)
Called by [facility] for critical glucose value of 510 Rest of CMP with no anion gap and normal bicarb. Will forward to clinic provider.

## 2017-01-24 NOTE — Telephone Encounter (Signed)
Hello, I'm trying to get in touch with her now. Thank you for letting me know about this.

## 2017-01-24 NOTE — Telephone Encounter (Signed)
I just spoke with [name]. She says that her CBG monitoring at home has been 247 since this office visit, she denies nausea, vomiting, abdominal pain, polydipsia, or polyuria. She has continued to have leg cramping which is very bothersome for her. She is open to returning to clinic this week and will call tomorrow to schedule an appointment. She will need an increase in her diabetes management regiment.

## 2017-01-24 NOTE — Telephone Encounter (Signed)
Hello Beverly Johnson... Would you mind calling Ms. Lobo to follow up on this?

## 2017-01-24 NOTE — Telephone Encounter (Signed)
Thank you Nina!

## 2017-01-27 NOTE — Progress Notes (Signed)
Internal Medicine Clinic Attending  Case discussed with Dr. O'Sullivan at the time of the visit.  We reviewed the resident's history and exam and pertinent patient test results.  I agree with the assessment, diagnosis, and plan of care documented in the resident's note. 

## 2017-01-29 NOTE — Telephone Encounter (Signed)
Attempted to contact patient at the request of Dr. Jenetta DownerConley Canal since no future appointment has been scheduled.  No answer left message asking to please return my phone call.

## 2017-01-30 ENCOUNTER — Emergency Department (HOSPITAL_COMMUNITY)
Admission: EM | Admit: 2017-01-30 | Discharge: 2017-01-31 | Disposition: A | Discharge status: Home / Self Care | Payer: Medicaid Other | Attending: Emergency Medicine | Admitting: Emergency Medicine

## 2017-01-30 ENCOUNTER — Encounter (HOSPITAL_COMMUNITY): Payer: Self-pay

## 2017-01-30 DIAGNOSIS — R739 Hyperglycemia, unspecified: Secondary | ICD-10-CM

## 2017-01-30 DIAGNOSIS — E1165 Type 2 diabetes mellitus with hyperglycemia: Secondary | ICD-10-CM | POA: Diagnosis present

## 2017-01-30 DIAGNOSIS — Z7984 Long term (current) use of oral hypoglycemic drugs: Secondary | ICD-10-CM | POA: Diagnosis not present

## 2017-01-30 DIAGNOSIS — B20 Human immunodeficiency virus [HIV] disease: Secondary | ICD-10-CM | POA: Diagnosis not present

## 2017-01-30 LAB — URINALYSIS, ROUTINE W REFLEX MICROSCOPIC
Bacteria, UA: NONE SEEN
Bilirubin Urine: NEGATIVE
HGB URINE DIPSTICK: NEGATIVE
KETONES UR: NEGATIVE mg/dL
LEUKOCYTES UA: NEGATIVE
NITRITE: NEGATIVE
Protein, ur: NEGATIVE mg/dL
Specific Gravity, Urine: 1.031 — ABNORMAL HIGH (ref 1.005–1.030)
WBC, UA: NONE SEEN WBC/hpf (ref 0–5)
pH: 5 (ref 5.0–8.0)

## 2017-01-30 LAB — BASIC METABOLIC PANEL
Anion gap: 7 (ref 5–15)
BUN: 10 mg/dL (ref 6–20)
CHLORIDE: 96 mmol/L — AB (ref 101–111)
CO2: 22 mmol/L (ref 22–32)
CREATININE: 0.84 mg/dL (ref 0.44–1.00)
Calcium: 9 mg/dL (ref 8.9–10.3)
GFR calc Af Amer: >60 mL/min (ref >60)
GFR calc non Af Amer: >60 mL/min (ref >60)
GLUCOSE: 536 mg/dL — AB (ref 65–99)
POTASSIUM: 3.8 mmol/L (ref 3.5–5.1)
SODIUM: 125 mmol/L — AB (ref 135–145)

## 2017-01-30 LAB — CBC
HCT: 37.4 % (ref 36.0–46.0)
Hemoglobin: 12.3 g/dL (ref 12.0–15.0)
MCH: 26.2 pg (ref 26.0–34.0)
MCHC: 32.9 g/dL (ref 30.0–36.0)
MCV: 79.6 fL (ref 78.0–100.0)
Platelets: 188 10*3/uL (ref 150–400)
RBC: 4.7 MIL/uL (ref 3.87–5.11)
RDW: 13.2 % (ref 11.5–15.5)
WBC: 4.9 10*3/uL (ref 4.0–10.5)

## 2017-01-30 MED ORDER — SODIUM CHLORIDE 0.9 % IV BOLUS (SEPSIS)
2000.0000 mL | Freq: Once | INTRAVENOUS | Status: AC
  Administered 2017-01-30: 2000 mL via INTRAVENOUS

## 2017-01-30 NOTE — ED Provider Notes (Signed)
[facility] DEPT Provider Note   CSN: [id number] Arrival date & time: 01/30/17  2109     History   Chief Complaint Chief Complaint  Patient presents with  . Hyperglycemia    HPI Beverly Johnson is a 41 y.o. female.  The history is provided by the patient and medical records. No language interpreter was used.   Beverly Johnson is a 41 y.o. female  with a PMH of DM2, HIV who presents to the Emergency Department complaining of wlevated glucose level on glucometer earlier today. Patient states that she felt in her usual state of health today and has had no recent illness. She went to the [employer] for dinner where she ate a sandwich, french fries and milkshake. She later went to [employer] where she got a frozen fruit slushie. She went home and checked her glucose with her and it read "high", prompting her to come to the emergency department. She states she is typically on metformin daily, she did not take this today. She notes that she takes it when she "can tell her sugars are high". No fever, chills, abdominal pain, nausea, vomiting, shortness of breath, dysuria. No other complaints. She took no medications prior to arrival.    Past Medical History:  Diagnosis Date  . Diabetes mellitus without complication ([place])   . Gallstones 03/10/2011  . HIV infection ([place])   . Immune deficiency disorder [facility])     Patient Active Problem List   Diagnosis Date Noted  . Bilateral leg cramps 01/22/2017  . Acrochordon 12/04/2015  . Healthcare maintenance 03/02/2014  . Morbid obesity with BMI of 45.0-49.9, adult ([place]) 04/27/2012  . Diabetes mellitus type 2, controlled ([place]) 12/08/2010  . DEPRESSION 01/12/2007  . Human immunodeficiency virus (HIV) disease ([place]) 09/29/2006    Past Surgical History:  Procedure Laterality Date  . CHOLECYSTECTOMY  06/29/2011   Procedure: LAPAROSCOPIC CHOLECYSTECTOMY WITH INTRAOPERATIVE CHOLANGIOGRAM;  Surgeon: Merrie Roof, MD;  Location: [facility];  Service:  General;  Laterality: N/A;  . NO PAST SURGERIES      OB History    No data available       Home Medications    Prior to Admission medications   Medication Sig Start Date End Date Taking? Authorizing Provider  GENVOYA 150-150-200-10 MG TABS tablet TAKE 1 TABLET BY MOUTH DAILY WITH BREAKFAST. 10/20/16  Yes Comer, Okey Regal, MD  metFORMIN (GLUCOPHAGE) 500 MG tablet Take 500 mg by mouth 2 (two) times daily with a meal.   Yes [provider]  ACCU-CHEK FASTCLIX LANCETS MISC Check blood sugar one time a day 12/04/15   Shisler Bales, MD  b complex vitamins capsule Take 1 capsule by mouth daily. Patient not taking: Reported on 01/30/2017 01/22/17   Minus Liberty, MD  Blood Glucose Monitoring Suppl (ACCU-CHEK NANO SMARTVIEW) w/Device KIT Check blood sugar one time a day 12/04/15   Marquess Bales, MD  glucose blood (ACCU-CHEK SMARTVIEW) test strip Check blood sugar one time a day 12/04/15   Cruise Bales, MD  metFORMIN (GLUCOPHAGE) 1000 MG tablet Take 1 tablet (1,000 mg total) by mouth 2 (two) times daily with a meal. Patient not taking: Reported on 01/30/2017 12/04/15 01/30/17  Milone Bales, MD    Family History History reviewed. No pertinent family history.  Social History Social History  Substance Use Topics  . Smoking status: Never Smoker  . Smokeless tobacco: Never Used  . Alcohol use No     Allergies   Patient  has no known allergies.   Review of Systems Review of Systems  Allergic/Immunologic: Positive for immunocompromised state.  All other systems reviewed and are negative.    Physical Exam Updated Vital Signs BP 125/85   Pulse 89   Temp 99.1 F (37.3 C)   Resp (!) 22   LMP 12/30/2016 (Approximate)   SpO2 100%   Physical Exam  Constitutional: She is oriented to person, place, and time. She appears well-developed and well-nourished. No distress.  HENT:  Head: Normocephalic and atraumatic.  Cardiovascular: Normal rate, regular rhythm and  normal heart sounds.   No murmur heard. Pulmonary/Chest: Effort normal and breath sounds normal. No respiratory distress. She has no wheezes. She has no rales.  Abdominal: Soft. She exhibits no distension. There is no tenderness.  Musculoskeletal: She exhibits no edema.  Neurological: She is alert and oriented to person, place, and time.  Skin: Skin is warm and dry.  Nursing note and vitals reviewed.    ED Treatments / Results  Labs (all labs ordered are listed, but only abnormal results are displayed) Labs Reviewed  BASIC METABOLIC PANEL - Abnormal; Notable for the following:       Result Value   Sodium 125 (*)    Chloride 96 (*)    Glucose, Bld 536 (*)    All other components within normal limits  URINALYSIS, ROUTINE W REFLEX MICROSCOPIC - Abnormal; Notable for the following:    Color, Urine STRAW (*)    Specific Gravity, Urine 1.031 (*)    Glucose, UA >=500 (*)    Squamous Epithelial / LPF 0-5 (*)    All other components within normal limits  CBG MONITORING, ED - Abnormal; Notable for the following:    Glucose-Capillary 348 (*)    All other components within normal limits  CBC  CBG MONITORING, ED    EKG  EKG Interpretation None       Radiology No results found.  Procedures Procedures (including critical care time)  Medications Ordered in ED Medications  sodium chloride 0.9 % bolus 2,000 mL (0 mLs Intravenous Stopped 01/31/17 0036)     Initial Impression / Assessment and Plan / ED Course  I have reviewed the triage vital signs and the nursing notes.  Pertinent labs & imaging results that were available during my care of the patient were reviewed by me and considered in my medical decision making (see chart for details).    Beverly Johnson is a 41 y.o. female who presents to ED for hyperglycemia which she noticed today. Patient states her sugars typically fun in 100's-200's. Today she ate full [employer] meal including french fries and milkshake. She later went  to [employer] and had a fruit slushie that had candy mixed in it. When she got home, she checked her blood sugar and it read high. Of note, she also has been inconsistent with taking her metformin and did not take it this morning. No AMS, nausea, vomiting, abdominal pain, shortness of breath. BMP with glucose of 5536. Normal CO2 and AG of 7. No ketones in the urine. No sxs to suggest DKA. Patient hydrated in ED with glucose trending downward, 348. On re-evaluation, patient with benign abdominal exam and no complaints. Discussed at length the importance of taking metformin daily as directed. Also discussed diet including decreasing sugary snack, carbs, etc. Strongly encouraged that she monitor sugars closely at home, increase hydration, adhere to dietary changes discussed and call PCP on Monday am to schedule follow up appointment.  Reasons to return to ER discussed and all questions answered.   Final Clinical Impressions(s) / ED Diagnoses   Final diagnoses:  Hyperglycemia    New Prescriptions New Prescriptions   No medications on file     Larron Armor, Ozella Almond, PA-C 01/31/17 0100    Drenda Freeze, MD 01/31/17 1501

## 2017-01-30 NOTE — ED Triage Notes (Signed)
Pt complaining of hyperglycemia. Pt states noncompliance with metformin. Pt states eating slushies and icecreams today. Pt states glucometer read "High".

## 2017-01-31 LAB — CBG MONITORING, ED: GLUCOSE-CAPILLARY: 348 mg/dL — AB (ref 65–99)

## 2017-01-31 NOTE — ED Notes (Signed)
Pt ambulated to bathroom without difficulty.

## 2017-01-31 NOTE — Discharge Instructions (Signed)
It was my pleasure taking care of you today!   Drink plenty of fluids. Take your metformin daily as directed. Do not skip doses. Monitor your sugars closely. Decrease amount of sugary drinks and treats. Also be careful about eating too many carbs (bread, pasta, french fries, potatoes, etc.). I would like you to follow up with your primary care physician to discuss today's hospital visit and to make sure your sugars are doing better. Please call them on Monday morning to schedule this follow up appointment. Return to the ER for new or worsening symptoms, any additional concerns.

## 2017-02-04 ENCOUNTER — Ambulatory Visit: Payer: Medicaid Other | Admitting: Internal Medicine

## 2017-02-09 ENCOUNTER — Ambulatory Visit: Payer: Medicaid Other

## 2017-02-09 ENCOUNTER — Encounter: Payer: Self-pay | Admitting: Internal Medicine

## 2017-03-08 ENCOUNTER — Emergency Department (HOSPITAL_COMMUNITY)
Admission: EM | Admit: 2017-03-08 | Discharge: 2017-03-08 | Disposition: A | Discharge status: Home / Self Care | Payer: Medicaid Other | Attending: Physician Assistant | Admitting: Physician Assistant

## 2017-03-08 ENCOUNTER — Encounter (HOSPITAL_COMMUNITY): Payer: Self-pay | Admitting: *Deleted

## 2017-03-08 DIAGNOSIS — E119 Type 2 diabetes mellitus without complications: Secondary | ICD-10-CM | POA: Insufficient documentation

## 2017-03-08 DIAGNOSIS — F329 Major depressive disorder, single episode, unspecified: Secondary | ICD-10-CM | POA: Insufficient documentation

## 2017-03-08 DIAGNOSIS — M79605 Pain in left leg: Secondary | ICD-10-CM | POA: Diagnosis not present

## 2017-03-08 DIAGNOSIS — M79604 Pain in right leg: Secondary | ICD-10-CM | POA: Insufficient documentation

## 2017-03-08 DIAGNOSIS — B20 Human immunodeficiency virus [HIV] disease: Secondary | ICD-10-CM | POA: Diagnosis not present

## 2017-03-08 DIAGNOSIS — Z9049 Acquired absence of other specified parts of digestive tract: Secondary | ICD-10-CM | POA: Insufficient documentation

## 2017-03-08 DIAGNOSIS — Z79899 Other long term (current) drug therapy: Secondary | ICD-10-CM | POA: Diagnosis not present

## 2017-03-08 DIAGNOSIS — Z7984 Long term (current) use of oral hypoglycemic drugs: Secondary | ICD-10-CM | POA: Insufficient documentation

## 2017-03-08 NOTE — ED Notes (Signed)
This RN attempted to give discharge instructions and paperwork. Patient not in the room or any of the bathrooms in the area.  Discharging patient from computer system.  Paper work here if patient returns.

## 2017-03-08 NOTE — ED Triage Notes (Signed)
Pt states that she has had bilateral leg cramping for several months. Pt states that the pain is constant. Pt denies redness, swelling, or point tenderness. Pt states that she has seen her PCP for this as well.

## 2017-03-08 NOTE — ED Notes (Signed)
Both entire legs hurting x several months, saw her dr and was told to exersize a little and take vit which she has been doing but cont to have pain aND CRAMPS

## 2017-03-08 NOTE — Discharge Instructions (Signed)
Please follow-up with your primary care for your leg pain.please return with any chest pain, shortness breath, or other concerns.

## 2017-03-08 NOTE — ED Provider Notes (Signed)
[facility] DEPT Provider Note   CSN: [id number] Arrival date & time: 03/08/17  1320   By signing my name below, I, Eunice Blase, attest that this documentation has been prepared under the direction and in the presence of Mackuen, Fredia Sorrow, MD. Electronically signed, Eunice Blase, ED Scribe. 03/08/17. 4:21 PM.  History   Chief Complaint Chief Complaint  Patient presents with  . Leg Pain   The history is provided by the patient and medical records. No language interpreter was used.    Beverly Johnson is a 41 y.o. female with h/o HIV and DM presenting to the Emergency Department concerning recurrent, worsening bilateral leg cramping x "months" that became severe today. She also notes frequent muscle cramps. Constant, 8/10 cramps described. She states the pain is worse with walking. She states she has had this issue evaluated by her PCP and sent home with home care instructions. No redness, swelling or pain noted to affected extremities. No other complaints at this time.   Past Medical History:  Diagnosis Date  . Diabetes mellitus without complication ([place])   . Gallstones 03/10/2011  . HIV infection ([place])   . Immune deficiency disorder [facility])     Patient Active Problem List   Diagnosis Date Noted  . Bilateral leg cramps 01/22/2017  . Acrochordon 12/04/2015  . Healthcare maintenance 03/02/2014  . Morbid obesity with BMI of 45.0-49.9, adult ([place]) 04/27/2012  . Diabetes mellitus type 2, controlled ([place]) 12/08/2010  . DEPRESSION 01/12/2007  . Human immunodeficiency virus (HIV) disease ([place]) 09/29/2006    Past Surgical History:  Procedure Laterality Date  . CHOLECYSTECTOMY  06/29/2011   Procedure: LAPAROSCOPIC CHOLECYSTECTOMY WITH INTRAOPERATIVE CHOLANGIOGRAM;  Surgeon: Merrie Roof, MD;  Location: [facility];  Service: General;  Laterality: N/A;  . NO PAST SURGERIES      OB History    No data available       Home Medications    Prior to Admission medications     Medication Sig Start Date End Date Taking? Authorizing Provider  ACCU-CHEK FASTCLIX LANCETS MISC Check blood sugar one time a day 12/04/15   Cockrum Bales, MD  b complex vitamins capsule Take 1 capsule by mouth daily. Patient not taking: Reported on 01/30/2017 01/22/17   Minus Liberty, MD  Blood Glucose Monitoring Suppl (ACCU-CHEK NANO SMARTVIEW) w/Device KIT Check blood sugar one time a day 12/04/15   Melcher Bales, MD  GENVOYA 150-150-200-10 MG TABS tablet TAKE 1 TABLET BY MOUTH DAILY WITH BREAKFAST. 10/20/16   Comer, Okey Regal, MD  glucose blood (ACCU-CHEK SMARTVIEW) test strip Check blood sugar one time a day 12/04/15   Cruzan Bales, MD  metFORMIN (GLUCOPHAGE) 1000 MG tablet Take 1 tablet (1,000 mg total) by mouth 2 (two) times daily with a meal. Patient not taking: Reported on 01/30/2017 12/04/15 01/30/17  Abed Bales, MD  metFORMIN (GLUCOPHAGE) 500 MG tablet Take 500 mg by mouth 2 (two) times daily with a meal.    [provider]    Family History No family history on file.  Social History Social History  Substance Use Topics  . Smoking status: Never Smoker  . Smokeless tobacco: Never Used  . Alcohol use No     Allergies   Patient has no known allergies.   Review of Systems Review of Systems  Constitutional: Negative for fever.  Cardiovascular: Negative for leg swelling.  Gastrointestinal: Negative for nausea and vomiting.  Musculoskeletal: Positive for myalgias. Negative for back pain, gait problem  and joint swelling.  Skin: Negative for color change and wound.  Neurological: Negative for weakness and numbness.  All other systems reviewed and are negative.    Physical Exam Updated Vital Signs BP 126/86 (BP Location: Left Arm)   Pulse 89   Temp 99 F (37.2 C) (Oral)   Resp 18   SpO2 100%   Physical Exam  Constitutional: She is oriented to person, place, and time. She appears well-developed and well-nourished.  HENT:  Head:  Normocephalic.  Eyes: Pupils are equal, round, and reactive to light. Conjunctivae and EOM are normal.  Neck: Normal range of motion.  Cardiovascular: Normal rate, regular rhythm and normal heart sounds.  Exam reveals no gallop and no friction rub.   No murmur heard. Pulmonary/Chest: Effort normal. No respiratory distress. She has no wheezes.  Abdominal: She exhibits no distension. There is no tenderness.  Musculoskeletal: Normal range of motion. She exhibits no tenderness.  Neurological: She is alert and oriented to person, place, and time.  Skin: Skin is warm. No erythema.  Psychiatric: She has a normal mood and affect.  Nursing note and vitals reviewed.    ED Treatments / Results  DIAGNOSTIC STUDIES: Oxygen Saturation is 100% on RA, NL by my interpretation.    COORDINATION OF CARE: 4:18 PM-Discussed next steps with pt. Pt verbalized understanding and is agreeable with the plan. Pt prepared for d/c, advised of symptomatic care at home, F/U instructions and return precautions. Will provide resources.   Labs (all labs ordered are listed, but only abnormal results are displayed) Labs Reviewed - No data to display  EKG  EKG Interpretation None       Radiology No results found.  Procedures Procedures (including critical care time)  Medications Ordered in ED Medications - No data to display   Initial Impression / Assessment and Plan / ED Course  I have reviewed the triage vital signs and the nursing notes.  Pertinent labs & imaging results that were available during my care of the patient were reviewed by me and considered in my medical decision making (see chart for details).    I personally performed the services described in this documentation, which was scribed in my presence. The recorded information has been reviewed and is accurate.   Patient complaining of months of nonspecific pain in her legs. Patient has normal appearance of the legs. We will have her  increase her potassium intake in case his low potassium causing cramping. We'll have her follow-up with primary care physician.  Doubt anyvascular cause, no evidence of infection, no swelling, could be atypical myopathy and we'll have her follow up with PCP for muscle biopsy as needed if continued.  Final Clinical Impressions(s) / ED Diagnoses   Final diagnoses:  None    New Prescriptions New Prescriptions   No medications on file       Macarthur Critchley, MD 03/08/17 [phone]

## 2017-03-11 ENCOUNTER — Other Ambulatory Visit: Payer: Self-pay | Admitting: Internal Medicine

## 2017-03-12 ENCOUNTER — Other Ambulatory Visit: Payer: Self-pay | Admitting: *Deleted

## 2017-03-12 DIAGNOSIS — E119 Type 2 diabetes mellitus without complications: Secondary | ICD-10-CM

## 2017-03-14 MED ORDER — ACCU-CHEK FASTCLIX LANCETS MISC: 4 refills | Status: DC

## 2017-03-14 MED ORDER — GLUCOSE BLOOD VI STRP
ORAL_STRIP | 8 refills | Status: DC
Start: 2017-03-14 — End: 2018-02-14

## 2017-03-16 ENCOUNTER — Encounter: Payer: Self-pay | Admitting: Internal Medicine

## 2017-03-16 ENCOUNTER — Ambulatory Visit (INDEPENDENT_AMBULATORY_CARE_PROVIDER_SITE_OTHER): Payer: Medicaid Other | Admitting: Internal Medicine

## 2017-03-16 VITALS — BP 129/84 | HR 88 | Temp 98.6°F | Ht 67.0 in | Wt 269.2 lb

## 2017-03-16 DIAGNOSIS — B20 Human immunodeficiency virus [HIV] disease: Secondary | ICD-10-CM | POA: Diagnosis not present

## 2017-03-16 DIAGNOSIS — R0789 Other chest pain: Secondary | ICD-10-CM

## 2017-03-16 DIAGNOSIS — Z79899 Other long term (current) drug therapy: Secondary | ICD-10-CM | POA: Diagnosis not present

## 2017-03-16 DIAGNOSIS — Z6841 Body Mass Index (BMI) 40.0 and over, adult: Secondary | ICD-10-CM

## 2017-03-16 DIAGNOSIS — E119 Type 2 diabetes mellitus without complications: Secondary | ICD-10-CM

## 2017-03-16 DIAGNOSIS — E559 Vitamin D deficiency, unspecified: Secondary | ICD-10-CM | POA: Diagnosis not present

## 2017-03-16 DIAGNOSIS — E1165 Type 2 diabetes mellitus with hyperglycemia: Secondary | ICD-10-CM | POA: Diagnosis present

## 2017-03-16 DIAGNOSIS — R252 Cramp and spasm: Secondary | ICD-10-CM

## 2017-03-16 LAB — GLUCOSE, CAPILLARY: GLUCOSE-CAPILLARY: 328 mg/dL — AB (ref 65–99)

## 2017-03-16 LAB — POCT GLYCOSYLATED HEMOGLOBIN (HGB A1C): HEMOGLOBIN A1C: 11.7

## 2017-03-16 MED ORDER — OMEPRAZOLE 40 MG PO CPDR: 40.0000 mg | DELAYED_RELEASE_CAPSULE | Freq: Every day | ORAL | 1 refills | Status: DC

## 2017-03-16 MED ORDER — ROSUVASTATIN CALCIUM 5 MG PO TABS: 5.0000 mg | ORAL_TABLET | Freq: Every day | ORAL | 5 refills | Status: DC

## 2017-03-16 NOTE — Patient Instructions (Addendum)
For the pain in your throat, start taking Mylanta ( an over the counter medication ) and omeprazole - this is a prescription that should be taken daily   For your high cholesterol, start taking rosuvastatin    Schedule a follow up appointment with Dr. Hetty Ely in 3-6 months

## 2017-03-16 NOTE — Progress Notes (Addendum)
CC: acute concern of leg pain and chest pain and follow up of HIV, type 2 diabetes mellitus, and extreme obesity  HPI:  [name] is a 41 y.o. with PMH as listed below who presents for acute concern of leg pain and follow up of HIV, type 2 diabetes mellitus, and extreme obesity. Please see the assessment and plans for the status of the patient chronic medical problems.   Past Medical History:  Diagnosis Date  . Diabetes mellitus without complication ([place])   . Gallstones 03/10/2011  . HIV infection ([place])   . Immune deficiency disorder ([place])    Review of Systems:  Refer to history of present illness and assessment and plans for pertinent review of systems, all others reviewed and negative.   Physical Exam:  Vitals:   03/16/17 1453  BP: 129/84  Pulse: 88  Temp: 98.6 F (37 C)  TempSrc: Oral  SpO2: 100%  Weight: 269 lb 3.2 oz (122.1 kg)  Height: 5\' 7"  (1.702 m)   Physical Exam  Constitutional: She is well-developed, well-nourished, and in no distress. No distress.  Cardiovascular: Normal rate and regular rhythm.   No murmur heard. No peripheral edema   Pulmonary/Chest: Effort normal and breath sounds normal. She has no wheezes. She has no rales. She exhibits tenderness.  Abdominal: Soft. She exhibits no distension. There is no guarding.  Tenderness to palpation over the upper abdominal quadrants  Skin: She is not diaphoretic.  Feet are well groomed and without ulcers or wounds    Assessment & Plan:   Chest wall pain  Patient has chest pain which started on Saturday shortly after eating a [employer] sandwich. The pain feels sharp and is on the right side of her chest and radiates to her back. The pain is worse with swallowing and reproducible with pressing on her chest wall. The pain is associated with a feeling that she needs to burp but cant. The pain is not worse with exertion. She denies shortness of breath, sore throat, metallic taste, difficulty swallowing.    Assessment: Patient has reproducible chest wall tenderness on exam and features of esophagitis. She is initially tearful and appears nervous, this improves throughout the conversation and she is not in distress. The features of this reproducible chest wall pain are atypical for cardiac pain.  - discussed return precautions  - trial of mylanta and omeprazole 40 mg daily  HIV  Follows with Dr. Linus Salmons. Last CD4 03/2016 530, and viral load undetectable. Reports good compliance with Genvoya. Follow up scheduled with [facility] next month.  - Continue Genvoya   T2 DM, uncontrolled with hyperglycemia  Last A1c 10/2016 7.9. Patient does not have her meter today. Recently blood glucose captured in the ED have been >500. Reports poor compliance with metformin 1000 mg BID she often forgets to take the second dose because she works in the evenings. Denies GI upset related to the metformin.   Assessment: Uncontrolled Type 2 Diabetes mellitus with possible proteinuria. Discussed with [name] the need for better metformin compliance and the option to add on a once daily injection such as victoza or pill such as sitagliptan. She is not open to adding on any more medications and understands the risk of having uncontrolled diabetes including nerve damage, kidney damage, heart attacks and stroke. Microalbumin/creatinine today is 409 which is in the range of macroalbuminuria. Review of the literature shows that we will need to confirm with 2/3 follow up specimens falling within the severely elevated albumin  range within the next 3-6 months to make a diagnosis of macroalbuminuria. If microalbumin remains elevated at this follow up check she will need to be started on ACE - I therapy.   -follow up A1c today >> 11.7. -Refilled metformin 1000 mg BID, the 1000 mg tablets are too high for her so she request 500 mg tablets  -Last eye exam 11/2015 : no diabetic retinopathy, referral for follow up eye exam today - start Rosuvastatin 5  mg daily ( see hyperlipidemia tab )  -Last urine microalbumin/creatinine 11/2015: 4.4, microalbumin/creatinine today 409  ADDENDUM: I have placed a standing order for follow up urine in 1 month and asked the lab to remind the patient of this appointment. She is concerned for this new finding and expresses that she does not want to go into kidney failure.   Morbid obesity  Gradually losing weight, has lost 6 lbs in the past month. Eats fast food often out of convenient. Declines working with a nutritionist on this or starting [place].  Assessment: Severe obesity with comorbidities including type 2 diabetes.  - continue counseling   leg cramps  Patient has cramping pain in her thighs, calves, and feet that feels like charlie horses and is constant throughout the day. The cramping does not radiate from her back and it is not associated with incontinence or numbness. She denies burning pain in her feet. The pain does not get worse with walking or at night. She was evaluated for this in the ED 6/16 and found to have normal potassium and hgb.  Assessment: The features of this cramping leg pain may which does not improve throughout the day is concerning for vitamin deficiency.  -TSH, B12, Vitamin D >> Vitamin D 17, will initiate treatment   Vitamin D deficiency  Vitamin D today 17.5 in the setting of cramping leg pain.  - Prescribed Vitamin D 1000 units daily  - Follow up level in 3 months   See Encounters Tab for problem based charting.  Patient discussed with Dr. Daryll Drown

## 2017-03-17 LAB — VITAMIN B12: Vitamin B-12: 580 pg/mL (ref 232–1245)

## 2017-03-17 LAB — MICROALBUMIN / CREATININE URINE RATIO
Creatinine, Urine: 70.8 mg/dL
MICROALBUM., U, RANDOM: 290.2 ug/mL
Microalb/Creat Ratio: 409.9 mg/g creat — ABNORMAL HIGH (ref 0.0–30.0)

## 2017-03-17 LAB — VITAMIN D 25 HYDROXY (VIT D DEFICIENCY, FRACTURES): VIT D 25 HYDROXY: 17.5 ng/mL — AB (ref 30.0–100.0)

## 2017-03-17 LAB — TSH: TSH: 1.2 u[IU]/mL (ref 0.450–4.500)

## 2017-03-21 DIAGNOSIS — E559 Vitamin D deficiency, unspecified: Secondary | ICD-10-CM | POA: Insufficient documentation

## 2017-03-21 DIAGNOSIS — R0789 Other chest pain: Secondary | ICD-10-CM | POA: Insufficient documentation

## 2017-03-21 HISTORY — DX: Vitamin D deficiency, unspecified: E55.9

## 2017-03-21 MED ORDER — VITAMIN D 1000 UNITS PO TABS: 1000.0000 [IU] | ORAL_TABLET | Freq: Every day | ORAL | 0 refills | Status: DC

## 2017-03-21 NOTE — Assessment & Plan Note (Signed)
Patient has cramping pain in her thighs, calves, and feet that feels like charlie horses and is constant throughout the day. The cramping does not radiate from her back and it is not associated with incontinence or numbness. She denies burning pain in her feet. The pain does not get worse with walking or at night. She was evaluated for this in the ED 6/16 and found to have normal potassium and hgb.  Assessment: The features of this cramping leg pain may which does not improve throughout the day is concerning for vitamin deficiency.  -TSH, B12, Vitamin D >> Vitamin D 17, will initiate treatment

## 2017-03-21 NOTE — Assessment & Plan Note (Signed)
Vitamin D today 17.5 in the setting of cramping leg pain.  - Prescribed Vitamin D 1000 units daily  - Follow up level in 3 months

## 2017-03-21 NOTE — Assessment & Plan Note (Signed)
Gradually losing weight, has lost 6 lbs in the past month. Eats fast food often out of convenient. Declines working with a nutritionist on this or starting [place].  Assessment: Severe obesity with comorbidities including type 2 diabetes.  - continue counseling

## 2017-03-21 NOTE — Assessment & Plan Note (Signed)
Follows with Dr. Linus Salmons. Last CD4 03/2016 530, and viral load undetectable. Reports good compliance with Genvoya. Follow up scheduled with [facility] next month.  - Continue Jorje Guild

## 2017-03-21 NOTE — Assessment & Plan Note (Addendum)
Last A1c 10/2016 7.9. Patient does not have her meter today. Recently blood glucose captured in the ED have been >500. Reports poor compliance with metformin 1000 mg BID she often forgets to take the second dose because she works in the evenings. Denies GI upset related to the metformin.   Assessment: Uncontrolled Type 2 Diabetes mellitus with possible proteinuria. Discussed with Beverly Johnson the need for better metformin compliance and the option to add on a once daily injection such as victoza or pill such as sitagliptan. She is not open to adding on any more medications and understands the risk of having uncontrolled diabetes including nerve damage, kidney damage, heart attacks and stroke. Microalbumin/creatinine today is 409 which is in the range of macroalbuminuria. Review of the literature shows that we will need to confirm with 2/3 follow up specimens falling within the severely elevated albumin range within the next 3-6 months to make a diagnosis of macroalbuminuria. If microalbumin remains elevated at this follow up check she will need to be started on ACE - I therapy.   -follow up A1c today >> 11.7. -continue to metformin 1000 mg BID -Last eye exam 11/2015 : no diabetic retinopathy, referral for follow up eye exam today - start Rosuvastatin 5 mg daily ( see hyperlipidemia tab )  -Last urine microalbumin/creatinine 11/2015: 4.4, microalbumin/creatinine today 409\  ADDENDUM: I have placed a standing order for follow up urine in 1 month and asked the lab to remind the patient of this appointment. She is concerned for this new finding and expresses that she does not want to go into kidney failure.

## 2017-03-21 NOTE — Assessment & Plan Note (Signed)
Patient has chest pain which started on Saturday shortly after eating a [employer] sandwich. The pain feels sharp and is on the right side of her chest and radiates to her back. The pain is worse with swallowing and reproducible with pressing on her chest wall. The pain is associated with a feeling that she needs to burp but cant. The pain is not worse with exertion. She denies shortness of breath, sore throat, metallic taste, difficulty swallowing.  Assessment: Patient has reproducible chest wall tenderness on exam and features of esophagitis. She is initially tearful and appears nervous, this improves throughout the conversation and she is not in distress. The features of this reproducible chest wall pain are atypical for cardiac pain.  - discussed return precautions  - trial of mylanta and omeprazole 40 mg daily

## 2017-03-22 MED ORDER — METFORMIN HCL 500 MG PO TABS: 1000.0000 mg | ORAL_TABLET | Freq: Two times a day (BID) | ORAL | 11 refills | Status: DC

## 2017-03-22 NOTE — Addendum Note (Signed)
Addended by: Meryl Dare on: 03/22/2017 12:57 PM   Modules accepted: Orders

## 2017-03-22 NOTE — Progress Notes (Signed)
Internal Medicine Clinic Attending  Case discussed with Dr. Blum at the time of the visit.  We reviewed the resident's history and exam and pertinent patient test results.  I agree with the assessment, diagnosis, and plan of care documented in the resident's note. 

## 2017-03-30 ENCOUNTER — Encounter: Payer: Self-pay | Admitting: Internal Medicine

## 2017-03-30 ENCOUNTER — Ambulatory Visit (INDEPENDENT_AMBULATORY_CARE_PROVIDER_SITE_OTHER): Payer: Medicaid Other | Admitting: Internal Medicine

## 2017-03-30 VITALS — BP 116/82 | HR 96 | Temp 98.6°F | Ht 68.0 in | Wt 267.0 lb

## 2017-03-30 DIAGNOSIS — Z79899 Other long term (current) drug therapy: Secondary | ICD-10-CM | POA: Diagnosis not present

## 2017-03-30 DIAGNOSIS — R252 Cramp and spasm: Secondary | ICD-10-CM | POA: Diagnosis not present

## 2017-03-30 DIAGNOSIS — G8929 Other chronic pain: Secondary | ICD-10-CM

## 2017-03-30 DIAGNOSIS — M25562 Pain in left knee: Secondary | ICD-10-CM

## 2017-03-30 DIAGNOSIS — B20 Human immunodeficiency virus [HIV] disease: Secondary | ICD-10-CM | POA: Diagnosis not present

## 2017-03-30 DIAGNOSIS — E1129 Type 2 diabetes mellitus with other diabetic kidney complication: Secondary | ICD-10-CM | POA: Diagnosis not present

## 2017-03-30 DIAGNOSIS — M25569 Pain in unspecified knee: Secondary | ICD-10-CM

## 2017-03-30 DIAGNOSIS — E1165 Type 2 diabetes mellitus with hyperglycemia: Secondary | ICD-10-CM

## 2017-03-30 DIAGNOSIS — M25561 Pain in right knee: Secondary | ICD-10-CM | POA: Diagnosis not present

## 2017-03-30 DIAGNOSIS — Z113 Encounter for screening for infections with a predominantly sexual mode of transmission: Secondary | ICD-10-CM | POA: Diagnosis not present

## 2017-03-30 DIAGNOSIS — IMO0002 Reserved for concepts with insufficient information to code with codable children: Secondary | ICD-10-CM

## 2017-03-30 DIAGNOSIS — R809 Proteinuria, unspecified: Secondary | ICD-10-CM | POA: Diagnosis not present

## 2017-03-30 LAB — CBC WITH DIFFERENTIAL/PLATELET
BASOS ABS: 0 {cells}/uL (ref 0–200)
Basophils Relative: 0 %
EOS PCT: 1 %
Eosinophils Absolute: 52 cells/uL (ref 15–500)
HCT: 38 % (ref 35.0–45.0)
HEMOGLOBIN: 12 g/dL (ref 11.7–15.5)
LYMPHS PCT: 33 %
Lymphs Abs: 1716 cells/uL (ref 850–3900)
MCH: 26.3 pg — AB (ref 27.0–33.0)
MCHC: 31.6 g/dL — AB (ref 32.0–36.0)
MCV: 83.2 fL (ref 80.0–100.0)
MPV: 10.5 fL (ref 7.5–12.5)
Monocytes Absolute: 364 cells/uL (ref 200–950)
Monocytes Relative: 7 %
NEUTROS PCT: 59 %
Neutro Abs: 3068 cells/uL (ref 1500–7800)
Platelets: 164 10*3/uL (ref 140–400)
RBC: 4.57 MIL/uL (ref 3.80–5.10)
RDW: 14.1 % (ref 11.0–15.0)
WBC: 5.2 10*3/uL (ref 3.8–10.8)

## 2017-03-30 MED ORDER — ELVITEG-COBIC-EMTRICIT-TENOFAF 150-150-200-10 MG PO TABS: 1.0000 | ORAL_TABLET | Freq: Every day | ORAL | 11 refills | Status: DC

## 2017-03-30 NOTE — Assessment & Plan Note (Signed)
I suggested weight loss; likely OA related

## 2017-03-30 NOTE — Assessment & Plan Note (Signed)
Most likely related to poorly controlled diabetes

## 2017-03-30 NOTE — Assessment & Plan Note (Signed)
I discussed with her that she is not in any impending danger for dialysis in the near future but needs to get her sugar controlled to avoid damage.

## 2017-03-30 NOTE — Progress Notes (Signed)
   Subjective:    Patient ID: Beverly Johnson, female    DOB: 02-14-1976, 41 y.o.   MRN: [id number]  HPI Here for follow up of HIV She has not been seen in 1 year but continues to take her medications well with no missed doses.  No labs prior to the appt.  She has no issues with Genvoya with no associated n/v/d.  She has multiple other complaints including bilateral knee pain, leg cramps and tired of taking diabetes medication.  She had a recent microablumin that was increased, though creat has been stable.  She is worried about dialysis and not wanting to ever do dialysis.  Her latest A1C is 11.7.     Review of Systems  Constitutional: Negative for chills and fatigue.  Gastrointestinal: Negative for diarrhea.  Musculoskeletal: Positive for arthralgias. Negative for joint swelling.  Skin: Negative for rash.       Objective:   Physical Exam  Constitutional: She appears well-developed and well-nourished. No distress.  HENT:  Mouth/Throat: No oropharyngeal exudate.  Eyes: No scleral icterus.  Cardiovascular: Normal rate, regular rhythm and normal heart sounds.   No murmur heard. Pulmonary/Chest: Effort normal and breath sounds normal. No respiratory distress. She has no wheezes.  Skin: No rash noted.   SH: no tobacco       Assessment & Plan:

## 2017-03-30 NOTE — Assessment & Plan Note (Signed)
I had a long discussion with her regarding her diabetes.  I explained the need to get her sugars in control and she can limit her medications needs by significant weight loss, improving her diet.  I discussed that her leg cramping and other issues are likely related to her diabetes and to start to work closely with her PCP.

## 2017-03-30 NOTE — Assessment & Plan Note (Signed)
Will screen today 

## 2017-03-31 ENCOUNTER — Telehealth: Payer: Self-pay | Admitting: Dietician

## 2017-03-31 LAB — COMPLETE METABOLIC PANEL WITH GFR
ALBUMIN: 3.6 g/dL (ref 3.6–5.1)
ALK PHOS: 105 U/L (ref 33–115)
ALT: 25 U/L (ref 6–29)
AST: 31 U/L — AB (ref 10–30)
BILIRUBIN TOTAL: 0.5 mg/dL (ref 0.2–1.2)
BUN: 7 mg/dL (ref 7–25)
CALCIUM: 8.8 mg/dL (ref 8.6–10.2)
CO2: 22 mmol/L (ref 20–32)
CREATININE: 0.81 mg/dL (ref 0.50–1.10)
Chloride: 101 mmol/L (ref 98–110)
GFR, Est African American: >89 mL/min (ref >=60)
GFR, Est Non African American: >89 mL/min (ref >=60)
GLUCOSE: 351 mg/dL — AB (ref 65–99)
Potassium: 4.3 mmol/L (ref 3.5–5.3)
SODIUM: 133 mmol/L — AB (ref 135–146)
TOTAL PROTEIN: 7.5 g/dL (ref 6.1–8.1)

## 2017-03-31 LAB — LIPID PANEL
CHOLESTEROL: 93 mg/dL (ref <200)
HDL: 40 mg/dL — ABNORMAL LOW (ref >50)
LDL CALC: 22 mg/dL (ref <100)
TRIGLYCERIDES: 153 mg/dL — AB (ref <150)
Total CHOL/HDL Ratio: 2.3 Ratio (ref <5.0)
VLDL: 31 mg/dL — AB (ref <30)

## 2017-03-31 LAB — RPR

## 2017-03-31 NOTE — Telephone Encounter (Signed)
She was at work. Her spouse suggested try again early am tomorrow

## 2017-03-31 NOTE — Telephone Encounter (Signed)
Patient calls back, she says she has been taking the metformin two pills twice a day for about 1 week. (She had to wait for someone to bring her her medicine.) She does not check her blood sugar regularly but checked one time in the past week and her blood sugar was in the 400s. We discussed that 400 was a high blood sugar. I asked her if when she checks it again and it is still high in the 300s or 400s what was her plan for follow up. She says she doesn't want to go on dialysis or take injections or other pills and has been working on her diet. She said she would come to the office on August 27th like her diabetes doctor told her to do. I explained that we usually like to have patients make appointments and tried to transfer her call to the front office and was unsuccessful. She said she'd call back to make the appointment. Note A1C was 11.7% on 03/16/17, so it would be good for her to follow up soon.

## 2017-04-01 LAB — HIV-1 RNA QUANT-NO REFLEX-BLD
HIV 1 RNA Quant: <20 copies/mL — AB
HIV-1 RNA Quant, Log: <1.3 Log copies/mL — AB

## 2017-04-01 LAB — T-HELPER CELL (CD4) - (RCID CLINIC ONLY)
CD4 % Helper T Cell: 33 % (ref 33–55)
CD4 T Cell Abs: 670 /uL (ref 400–2700)

## 2017-04-01 NOTE — Telephone Encounter (Signed)
Thank you... I agree that she will need to be evaluated soon

## 2017-04-02 ENCOUNTER — Encounter (HOSPITAL_COMMUNITY): Payer: Self-pay

## 2017-04-02 ENCOUNTER — Emergency Department (HOSPITAL_COMMUNITY)
Admission: EM | Admit: 2017-04-02 | Discharge: 2017-04-02 | Disposition: A | Discharge status: Home / Self Care | Payer: Medicaid Other | Attending: Emergency Medicine | Admitting: Emergency Medicine

## 2017-04-02 DIAGNOSIS — Z5321 Procedure and treatment not carried out due to patient leaving prior to being seen by health care provider: Secondary | ICD-10-CM | POA: Insufficient documentation

## 2017-04-02 DIAGNOSIS — L299 Pruritus, unspecified: Secondary | ICD-10-CM | POA: Insufficient documentation

## 2017-04-02 DIAGNOSIS — N938 Other specified abnormal uterine and vaginal bleeding: Secondary | ICD-10-CM | POA: Diagnosis not present

## 2017-04-02 DIAGNOSIS — R309 Painful micturition, unspecified: Secondary | ICD-10-CM | POA: Diagnosis not present

## 2017-04-02 NOTE — ED Triage Notes (Signed)
Pt presents to the ed with complaints of vaginal bleeding, itching, and burning with urination. Denies any other symptoms.

## 2017-04-03 ENCOUNTER — Encounter (HOSPITAL_COMMUNITY): Payer: Self-pay | Admitting: Emergency Medicine

## 2017-04-03 ENCOUNTER — Emergency Department (HOSPITAL_COMMUNITY)
Admission: EM | Admit: 2017-04-03 | Discharge: 2017-04-03 | Disposition: A | Discharge status: Home / Self Care | Payer: Medicaid Other | Source: Home / Self Care | Attending: Emergency Medicine | Admitting: Emergency Medicine

## 2017-04-03 ENCOUNTER — Emergency Department (HOSPITAL_COMMUNITY)
Admission: EM | Admit: 2017-04-03 | Discharge: 2017-04-03 | Disposition: A | Discharge status: Home / Self Care | Payer: Medicaid Other | Attending: Emergency Medicine | Admitting: Emergency Medicine

## 2017-04-03 ENCOUNTER — Encounter (HOSPITAL_COMMUNITY): Payer: Self-pay

## 2017-04-03 DIAGNOSIS — B9689 Other specified bacterial agents as the cause of diseases classified elsewhere: Secondary | ICD-10-CM

## 2017-04-03 DIAGNOSIS — Z5321 Procedure and treatment not carried out due to patient leaving prior to being seen by health care provider: Secondary | ICD-10-CM | POA: Insufficient documentation

## 2017-04-03 DIAGNOSIS — N3001 Acute cystitis with hematuria: Secondary | ICD-10-CM

## 2017-04-03 DIAGNOSIS — N760 Acute vaginitis: Principal | ICD-10-CM

## 2017-04-03 DIAGNOSIS — B379 Candidiasis, unspecified: Secondary | ICD-10-CM

## 2017-04-03 LAB — COMPREHENSIVE METABOLIC PANEL
ALK PHOS: 116 U/L (ref 38–126)
ALT: 26 U/L (ref 14–54)
ANION GAP: 7 (ref 5–15)
AST: 36 U/L (ref 15–41)
Albumin: 3.6 g/dL (ref 3.5–5.0)
BUN: 8 mg/dL (ref 6–20)
CALCIUM: 9.3 mg/dL (ref 8.9–10.3)
CO2: 24 mmol/L (ref 22–32)
CREATININE: 0.69 mg/dL (ref 0.44–1.00)
Chloride: 103 mmol/L (ref 101–111)
Glucose, Bld: 293 mg/dL — ABNORMAL HIGH (ref 65–99)
Potassium: 3.9 mmol/L (ref 3.5–5.1)
SODIUM: 134 mmol/L — AB (ref 135–145)
TOTAL PROTEIN: 8.1 g/dL (ref 6.5–8.1)
Total Bilirubin: 0.4 mg/dL (ref 0.3–1.2)

## 2017-04-03 LAB — CBC
HCT: 35.5 % — ABNORMAL LOW (ref 36.0–46.0)
HEMOGLOBIN: 11.7 g/dL — AB (ref 12.0–15.0)
MCH: 26.3 pg (ref 26.0–34.0)
MCHC: 33 g/dL (ref 30.0–36.0)
MCV: 79.8 fL (ref 78.0–100.0)
PLATELETS: 190 10*3/uL (ref 150–400)
RBC: 4.45 MIL/uL (ref 3.87–5.11)
RDW: 13.1 % (ref 11.5–15.5)
WBC: 4.3 10*3/uL (ref 4.0–10.5)

## 2017-04-03 LAB — LIPASE, BLOOD: LIPASE: 41 U/L (ref 11–51)

## 2017-04-03 LAB — URINALYSIS, ROUTINE W REFLEX MICROSCOPIC
Bacteria, UA: NONE SEEN
Bilirubin Urine: NEGATIVE
Ketones, ur: NEGATIVE mg/dL
Leukocytes, UA: NEGATIVE
Nitrite: NEGATIVE
PH: 6 (ref 5.0–8.0)
Protein, ur: NEGATIVE mg/dL
SPECIFIC GRAVITY, URINE: 1.037 — AB (ref 1.005–1.030)

## 2017-04-03 LAB — WET PREP, GENITAL
SPERM: NONE SEEN
Trich, Wet Prep: NONE SEEN
WBC WET PREP: NONE SEEN
YEAST WET PREP: NONE SEEN

## 2017-04-03 LAB — POC URINE PREG, ED: Preg Test, Ur: NEGATIVE

## 2017-04-03 LAB — I-STAT BETA HCG BLOOD, ED (MC, WL, AP ONLY)

## 2017-04-03 MED ORDER — PHENAZOPYRIDINE HCL 200 MG PO TABS
200.0000 mg | ORAL_TABLET | Freq: Once | ORAL | Status: AC
  Administered 2017-04-03: 200 mg via ORAL
  Filled 2017-04-03: qty 1

## 2017-04-03 MED ORDER — FLUCONAZOLE 200 MG PO TABS
200.0000 mg | ORAL_TABLET | Freq: Once | ORAL | Status: AC
  Administered 2017-04-03: 200 mg via ORAL
  Filled 2017-04-03: qty 1

## 2017-04-03 MED ORDER — METRONIDAZOLE 500 MG PO TABS: 500.0000 mg | ORAL_TABLET | Freq: Two times a day (BID) | ORAL | 0 refills | Status: DC

## 2017-04-03 MED ORDER — PHENAZOPYRIDINE HCL 200 MG PO TABS: 200.0000 mg | ORAL_TABLET | Freq: Three times a day (TID) | ORAL | 0 refills | Status: DC

## 2017-04-03 NOTE — ED Triage Notes (Addendum)
Patient c/o lower abdominal pain, light vaginal bleeding x 2 days and dysuria x 1 week. Patient states she was seen today, but left because she had to be somewhere real quick.

## 2017-04-03 NOTE — ED Provider Notes (Signed)
[facility] DEPT Provider Note   CSN: [id number] Arrival date & time: 04/03/17  0848  By signing my name below, I, Marcello Moores, attest that this documentation has been prepared under the direction and in the presence of Debroah Baller, NP Electronically Signed: Marcello Moores, ED Scribe. 04/03/17. 11:53 AM.  History   Chief Complaint Chief Complaint  Patient presents with  . Vaginal Bleeding  . Dysuria  . Abdominal Pain   The history is provided by the patient. No language interpreter was used.  Vaginal Bleeding  Primary symptoms include dysuria, and vaginal bleeding (? vs hematuria).  Primary symptoms include no discharge. There has been no fever. This is a new problem. The current episode started 2 days ago. The problem occurs constantly. The problem has been gradually worsening. The symptoms occur during urination. She is not pregnant. She has not missed her period. Her LMP was weeks ago. Associated symptoms include abdominal pain and frequency. She has tried nothing for the symptoms. Sexual activity: sexually active. Contraceptive use: tubal ligation. Associated medical issues include STD (HIV).  Dysuria   This is a new problem. The current episode started more than 2 days ago. The problem occurs every urination. The problem has not changed since onset.The pain is moderate. There has been no fever. She is sexually active. There is no history of pyelonephritis. Associated symptoms include frequency and urgency. Pertinent negatives include no chills and no discharge. She has tried nothing for the symptoms.  Abdominal Pain   This is a new problem. The current episode started 2 days ago. The problem occurs constantly. The problem has been gradually worsening. The pain is associated with an unknown factor. The pain is located in the suprapubic region. The quality of the pain is sharp. The pain is at a severity of 7/10. The pain is moderate. Associated symptoms include dysuria and  frequency. Pertinent negatives include fever and headaches. Nothing aggravates the symptoms. Nothing relieves the symptoms.   HPI Comments: Beverly Johnson is a 41 y.o. female with hx of HIV and DM presents to the Emergency Department complaining of persistent mild ?vaginal bleeding or bleeding with urination, with associated 7/10 "sharp" abdominal pain, frequency, and urgency that began two days ago. Her associated dysuria began one week ago. (She clarified that she is unsure if it's vaginal bleeding or hematuria). The pt states that she has had a UTI before and that her current symptoms feel similar. Her LNMP was March 14, 2017. G: 3 P: 3 A: 0, where her children were delivered via C-section. Although the pt denies usage of birth control, she reports of a h/x of tubal ligation. The pt additionally reports being married for 7 years. The pt denies headaches, fevers, chills, congestion, vaginal discharge, and additional complaints at this time. Last sexual intercourse 2 weeks ago.  Patient recently evaluated in the Infectious Disease Clinic and had a good report.   Past Medical History:  Diagnosis Date  . Diabetes mellitus without complication ([place])   . Gallstones 03/10/2011  . HIV infection ([place])   . Immune deficiency disorder [facility])     Patient Active Problem List   Diagnosis Date Noted  . Screening examination for venereal disease 03/30/2017  . Encounter for long-term (current) use of high-risk medication 03/30/2017  . Microalbuminuria due to type 2 diabetes mellitus ([place]) 03/30/2017  . Knee pain, chronic 03/30/2017  . Chest wall pain 03/21/2017  . Vitamin D deficiency 03/21/2017  . Bilateral leg cramps 01/22/2017  . Healthcare maintenance  03/02/2014  . Morbid obesity with BMI of 45.0-49.9, adult ([place]) 04/27/2012  . Uncontrolled type 2 diabetes mellitus ([place]) 12/08/2010  . Human immunodeficiency virus (HIV) disease ([place]) 09/29/2006    Past Surgical History:  Procedure Laterality Date    . CHOLECYSTECTOMY  06/29/2011   Procedure: LAPAROSCOPIC CHOLECYSTECTOMY WITH INTRAOPERATIVE CHOLANGIOGRAM;  Surgeon: Merrie Roof, MD;  Location: [facility];  Service: General;  Laterality: N/A;  . NO PAST SURGERIES      OB History    No data available       Home Medications    Prior to Admission medications   Medication Sig Start Date End Date Taking? Authorizing Provider  b complex vitamins capsule Take 1 capsule by mouth daily. 01/22/17  Yes Minus Liberty, MD  cholecalciferol (VITAMIN D) 1000 units tablet Take 1 tablet (1,000 Units total) by mouth daily. 03/21/17  Yes Ledell Noss, MD  elvitegravir-cobicistat-emtricitabine-tenofovir (GENVOYA) 150-150-200-10 MG TABS tablet Take 1 tablet by mouth daily with breakfast. 03/30/17  Yes Comer, Okey Regal, MD  metFORMIN (GLUCOPHAGE) 500 MG tablet Take 2 tablets (1,000 mg total) by mouth 2 (two) times daily with a meal. 03/22/17 05/19/18 Yes Ledell Noss, MD  rosuvastatin (CRESTOR) 5 MG tablet Take 1 tablet (5 mg total) by mouth daily. 03/16/17  Yes Ledell Noss, MD  ACCU-CHEK FASTCLIX LANCETS MISC Check blood sugar one time a day 03/14/17   Ledell Noss, MD  Blood Glucose Monitoring Suppl (ACCU-CHEK NANO SMARTVIEW) w/Device KIT Check blood sugar one time a day 12/04/15   Nawrot Bales, MD  glucose blood (ACCU-CHEK SMARTVIEW) test strip Check blood sugar one time a day 03/14/17   Ledell Noss, MD  metroNIDAZOLE (FLAGYL) 500 MG tablet Take 1 tablet (500 mg total) by mouth 2 (two) times daily. 04/03/17   Ashley Murrain, NP  phenazopyridine (PYRIDIUM) 200 MG tablet Take 1 tablet (200 mg total) by mouth 3 (three) times daily. 04/03/17   Ashley Murrain, NP    Family History No family history on file.  Social History Social History  Substance Use Topics  . Smoking status: Never Smoker  . Smokeless tobacco: Never Used  . Alcohol use No     Allergies   Patient has no known allergies.   Review of Systems Review of Systems  Constitutional: Negative for  chills and fever.  HENT: Negative.   Gastrointestinal: Positive for abdominal pain.  Genitourinary: Positive for dysuria, frequency, urgency and vaginal bleeding (? vs hematuria).  Musculoskeletal: Positive for back pain.  Skin: Negative for rash.  Allergic/Immunologic: Positive for immunocompromised state.  Neurological: Negative for syncope and headaches.  Hematological: Negative for adenopathy.  Psychiatric/Behavioral: The patient is not nervous/anxious.      Physical Exam Updated Vital Signs BP 112/71   Pulse 74   Temp 97.9 F (36.6 C) (Oral)   Resp 14   LMP 03/13/2017 (Exact Date)   SpO2 100%   Physical Exam  Constitutional: She appears well-developed and well-nourished. No distress.  HENT:  Head: Normocephalic.  Eyes: EOM are normal.  Neck: Normal range of motion.  Cardiovascular: Normal rate and regular rhythm.   Pulmonary/Chest: Effort normal. No respiratory distress.  Clear to auscultation. No CVA tenderness.   Abdominal: Soft. Bowel sounds are normal. She exhibits no distension. There is tenderness in the suprapubic area. There is no CVA tenderness.  Genitourinary:  Genitourinary Comments: External genitalia with rash c/w monilia. Yellow frothy d/c vaginal vault, no CMT, no adnexal tenderness. Unable to palpate uterus due to  patient habitus.   Musculoskeletal: Normal range of motion.  Neurological: She is alert.  Skin: Skin is warm and dry.  Psychiatric: She has a normal mood and affect.  Nursing note and vitals reviewed.    ED Treatments / Results   DIAGNOSTIC STUDIES: Oxygen Saturation is 97% on RA, normal by my interpretation.   COORDINATION OF CARE: 11:30 AM-Discussed next steps with pt. Pt verbalized understanding and is agreeable with the plan.   Labs (all labs ordered are listed, but only abnormal results are displayed) Labs Reviewed  WET PREP, GENITAL - Abnormal; Notable for the following:       Result Value   Clue Cells Wet Prep HPF POC  PRESENT (*)    All other components within normal limits  URINALYSIS, ROUTINE W REFLEX MICROSCOPIC - Abnormal; Notable for the following:    Specific Gravity, Urine 1.037 (*)    Glucose, UA >=500 (*)    Hgb urine dipstick LARGE (*)    Squamous Epithelial / LPF 0-5 (*)    All other components within normal limits  URINE CULTURE  POC URINE PREG, ED  GC/CHLAMYDIA PROBE AMP ([place]) NOT AT [facility]    Radiology No results found.  Procedures Procedures (including critical care time)  Medications Ordered in ED Medications  fluconazole (DIFLUCAN) tablet 200 mg (200 mg Oral Given 04/03/17 1431)  phenazopyridine (PYRIDIUM) tablet 200 mg (200 mg Oral Given 04/03/17 1431)     Initial Impression / Assessment and Plan / ED Course  I have reviewed the triage vital signs and the nursing notes. 41 y.o. female with HIV here today with hematuria and urinary frequency stable for d/c without acute abdomen and does not appear toxic. Labs from yesterday reviewed and labs from today reviewed. I discussed with the patient clinical and lab findings and plan of care. Will treat for BV and hemorrhagic cystitis, urine sent for culture. Will treat for yeast infection. I discussed this case with Dr. Billy Fischer and she agrees with plan.  Patient will f/u with her PCP or return for worsening symptoms.   Final Clinical Impressions(s) / ED Diagnoses   Final diagnoses:  BV (bacterial vaginosis)  Acute cystitis with hematuria  Monilia infection    New Prescriptions Discharge Medication List as of 04/03/2017  2:02 PM    START taking these medications   Details  metroNIDAZOLE (FLAGYL) 500 MG tablet Take 1 tablet (500 mg total) by mouth 2 (two) times daily., Starting Sat 04/03/2017, Print    phenazopyridine (PYRIDIUM) 200 MG tablet Take 1 tablet (200 mg total) by mouth 3 (three) times daily., Starting Sat 04/03/2017, Print      I personally performed the services described in this documentation, which was  scribed in my presence. The recorded information has been reviewed and is accurate.    Debroah [place], [place] 04/03/17 2774    Gareth Morgan, MD 04/05/17 1224

## 2017-04-03 NOTE — ED Triage Notes (Signed)
Patient complaining of lower abdominal pain. Patient states that this started Thursday Apr 01, 2017. Patient states that she is spotting blood and it burns when she urinates. She also is having lower abdominal pain.

## 2017-04-03 NOTE — ED Notes (Signed)
Patient stated she has to leave because she has an emergency that she has to be at 8:00 am.

## 2017-04-05 LAB — GC/CHLAMYDIA PROBE AMP (~~LOC~~) NOT AT ARMC
Chlamydia: NEGATIVE
NEISSERIA GONORRHEA: NEGATIVE

## 2017-04-06 LAB — URINE CULTURE: Culture: 70000 — AB

## 2017-04-07 ENCOUNTER — Telehealth: Payer: Self-pay | Admitting: Emergency Medicine

## 2017-04-07 NOTE — Progress Notes (Signed)
ED Antimicrobial Stewardship Positive Culture Follow Up   Beverly Johnson is an 41 y.o. female who presented to [facility] on 04/03/2017 with a chief complaint of  Chief Complaint  Patient presents with  . Vaginal Bleeding  . Dysuria  . Abdominal Pain    Recent Results (from the past 720 hour(s))  Wet prep, genital     Status: Abnormal   Collection Time: 04/03/17 12:21 PM  Result Value Ref Range Status   Yeast Wet Prep HPF POC NONE SEEN NONE SEEN Final   Trich, Wet Prep NONE SEEN NONE SEEN Final   Clue Cells Wet Prep HPF POC PRESENT (A) NONE SEEN Final   WBC, Wet Prep HPF POC NONE SEEN NONE SEEN Final   Sperm NONE SEEN  Final  Urine culture     Status: Abnormal   Collection Time: 04/03/17  2:00 PM  Result Value Ref Range Status   Specimen Description URINE, RANDOM  Final   Special Requests NONE  Final   Culture 70,000 COLONIES/mL STAPHYLOCOCCUS AUREUS (A)  Final   Report Status 04/06/2017 FINAL  Final   Organism ID, Bacteria STAPHYLOCOCCUS AUREUS (A)  Final      Susceptibility   Staphylococcus aureus - MIC*    CIPROFLOXACIN <=0.5 SENSITIVE Sensitive     GENTAMICIN <=0.5 SENSITIVE Sensitive     NITROFURANTOIN <=16 SENSITIVE Sensitive     OXACILLIN <=0.25 SENSITIVE Sensitive     TETRACYCLINE <=1 SENSITIVE Sensitive     VANCOMYCIN <=0.5 SENSITIVE Sensitive     TRIMETH/SULFA <=10 SENSITIVE Sensitive     CLINDAMYCIN <=0.25 SENSITIVE Sensitive     RIFAMPIN <=0.5 SENSITIVE Sensitive     Inducible Clindamycin NEGATIVE Sensitive     * 70,000 COLONIES/mL STAPHYLOCOCCUS AUREUS    []  Treated with , organism resistant to prescribed antimicrobial []  Patient discharged originally without antimicrobial agent and treatment is now indicated  Taking metronidazole for bacterial vaginosis.  Plan: Call patient to ask if still having urinary symptoms. If she has symptoms, plan to start cephalexin 500 mg PO BID x 7 days.  ED Provider: Lenn Sink, PA-C  Nida Boatman, PharmD PGY1 Acute  Care Pharmacy Resident Pager: [phone] 04/07/2017 10:16 AM

## 2017-04-07 NOTE — Telephone Encounter (Signed)
Post ED Visit - Positive Culture Follow-up: Successful Patient Follow-Up  Culture assessed and recommendations reviewed by: []  Elenor Quinones, Pharm.D. []  Heide Guile, Pharm.D., BCPS AQ-ID []  Parks Neptune, Pharm.D., BCPS []  Alycia Rossetti, Pharm.D., BCPS []  [place], Pharm.D., BCPS, AAHIVP []  Legrand Como, Pharm.D., BCPS, AAHIVP []  Salome Arnt, PharmD, BCPS []  Dimitri Ped, PharmD, BCPS []  Vincenza Hews, PharmD, BCPS Nida Boatman PharmD  Positive urine culture  [x]  Patient discharged without antimicrobial prescription and treatment is now indicated []  Organism is resistant to prescribed ED discharge antimicrobial []  Patient with positive blood cultures  Changes discussed with ED provider: Lenn Sink PA New antibiotic prescription start Cephalexin 500mg  po bid x 7 days Called to [facility] [phone]  Contacted patient, 04/07/2017 1446   Beverly Johnson 04/07/2017, 2:43 PM

## 2017-04-12 ENCOUNTER — Ambulatory Visit: Payer: Medicaid Other

## 2017-05-03 ENCOUNTER — Other Ambulatory Visit: Payer: Self-pay | Admitting: Internal Medicine

## 2017-05-03 ENCOUNTER — Ambulatory Visit: Payer: Self-pay

## 2017-05-04 ENCOUNTER — Encounter: Payer: Self-pay | Admitting: Internal Medicine

## 2017-05-26 ENCOUNTER — Emergency Department (HOSPITAL_COMMUNITY)
Admission: EM | Admit: 2017-05-26 | Discharge: 2017-05-26 | Disposition: A | Discharge status: Home / Self Care | Payer: Medicaid Other | Attending: Emergency Medicine | Admitting: Emergency Medicine

## 2017-05-26 ENCOUNTER — Encounter (HOSPITAL_COMMUNITY): Payer: Self-pay | Admitting: Emergency Medicine

## 2017-05-26 DIAGNOSIS — N898 Other specified noninflammatory disorders of vagina: Secondary | ICD-10-CM | POA: Diagnosis present

## 2017-05-26 DIAGNOSIS — B373 Candidiasis of vulva and vagina: Secondary | ICD-10-CM | POA: Diagnosis not present

## 2017-05-26 DIAGNOSIS — B3731 Acute candidiasis of vulva and vagina: Secondary | ICD-10-CM

## 2017-05-26 DIAGNOSIS — Z79899 Other long term (current) drug therapy: Secondary | ICD-10-CM | POA: Diagnosis not present

## 2017-05-26 DIAGNOSIS — Z7984 Long term (current) use of oral hypoglycemic drugs: Secondary | ICD-10-CM | POA: Diagnosis not present

## 2017-05-26 DIAGNOSIS — E119 Type 2 diabetes mellitus without complications: Secondary | ICD-10-CM | POA: Insufficient documentation

## 2017-05-26 MED ORDER — MICONAZOLE NITRATE 2 % EX CREA: 1.0000 "application " | TOPICAL_CREAM | Freq: Two times a day (BID) | CUTANEOUS | 0 refills | Status: DC

## 2017-05-26 MED ORDER — FLUCONAZOLE 200 MG PO TABS: 200.0000 mg | ORAL_TABLET | Freq: Every day | ORAL | 0 refills | Status: AC

## 2017-05-26 MED ORDER — FLUCONAZOLE 200 MG PO TABS
200.0000 mg | ORAL_TABLET | Freq: Once | ORAL | Status: AC
  Administered 2017-05-26: 200 mg via ORAL
  Filled 2017-05-26: qty 1

## 2017-05-26 NOTE — ED Triage Notes (Signed)
Pt c/o vaginal itching and rash. Denies discharge.

## 2017-05-26 NOTE — ED Provider Notes (Signed)
[facility] DEPT Provider Note   CSN: [id number] Arrival date & time: 05/26/17  1051     History   Chief Complaint Chief Complaint  Patient presents with  . Vaginal Itching    HPI Beverly Johnson is a 42 y.o. female.  Patient is a 41 year old female with a history of HIV, diabetes and gallstones presenting today with persistent vaginal discharge and a rash in her vaginal area. It is itching constantly and a thick curd-like discharge.  She denies any abdominal pain, fever or other systemic symptoms. Patient states last week she was treated for urinary tract infection and the yeast. The burning with urination went away but the yeast is only worsened.   The history is provided by the patient.  Vaginal Itching  This is a new problem. The current episode started more than 1 week ago. The problem occurs constantly. The problem has been gradually worsening. Pertinent negatives include no abdominal pain. Associated symptoms comments: No fever, no concern for STD as patient is married and has been sexually active with only her husband for the last 7 years..    Past Medical History:  Diagnosis Date  . Diabetes mellitus without complication ([place])   . Gallstones 03/10/2011  . HIV infection ([place])   . Immune deficiency disorder [facility])     Patient Active Problem List   Diagnosis Date Noted  . Screening examination for venereal disease 03/30/2017  . Encounter for long-term (current) use of high-risk medication 03/30/2017  . Microalbuminuria due to type 2 diabetes mellitus ([place]) 03/30/2017  . Knee pain, chronic 03/30/2017  . Chest wall pain 03/21/2017  . Vitamin D deficiency 03/21/2017  . Bilateral leg cramps 01/22/2017  . Healthcare maintenance 03/02/2014  . Morbid obesity with BMI of 45.0-49.9, adult ([place]) 04/27/2012  . Uncontrolled type 2 diabetes mellitus ([place]) 12/08/2010  . Human immunodeficiency virus (HIV) disease ([place]) 09/29/2006    Past Surgical History:  Procedure  Laterality Date  . CHOLECYSTECTOMY  06/29/2011   Procedure: LAPAROSCOPIC CHOLECYSTECTOMY WITH INTRAOPERATIVE CHOLANGIOGRAM;  Surgeon: Merrie Roof, MD;  Location: [place];  Service: General;  Laterality: N/A;  . NO PAST SURGERIES      OB History    No data available       Home Medications    Prior to Admission medications   Medication Sig Start Date End Date Taking? Authorizing Provider  ACCU-CHEK FASTCLIX LANCETS MISC Check blood sugar one time a day 03/14/17  Yes Ledell Noss, MD  b complex vitamins capsule Take 1 capsule by mouth daily. 01/22/17  Yes Minus Liberty, MD  Blood Glucose Monitoring Suppl (ACCU-CHEK NANO SMARTVIEW) w/Device KIT Check blood sugar one time a day 12/04/15  Yes Hook Bales, MD  cholecalciferol (VITAMIN D) 1000 units tablet Take 1 tablet (1,000 Units total) by mouth daily. 03/21/17  Yes Ledell Noss, MD  elvitegravir-cobicistat-emtricitabine-tenofovir (GENVOYA) 150-150-200-10 MG TABS tablet Take 1 tablet by mouth daily with breakfast. 03/30/17  Yes Comer, Okey Regal, MD  glucose blood (ACCU-CHEK SMARTVIEW) test strip Check blood sugar one time a day 03/14/17  Yes Ledell Noss, MD  metFORMIN (GLUCOPHAGE) 500 MG tablet Take 2 tablets (1,000 mg total) by mouth 2 (two) times daily with a meal. 03/22/17 05/19/18 Yes Ledell Noss, MD  omeprazole (PRILOSEC) 40 MG capsule TAKE 1 CAPSULE (40 MG TOTAL) BY MOUTH DAILY. Patient taking differently: Take 40 mg by mouth daily as needed (for acid reflex).  05/04/17  Yes Ledell Noss, MD  metroNIDAZOLE (FLAGYL) 500 MG tablet  Take 1 tablet (500 mg total) by mouth 2 (two) times daily. Patient not taking: Reported on 05/26/2017 04/03/17   Ashley Murrain, NP  phenazopyridine (PYRIDIUM) 200 MG tablet Take 1 tablet (200 mg total) by mouth 3 (three) times daily. Patient not taking: Reported on 05/26/2017 04/03/17   Ashley Murrain, NP  rosuvastatin (CRESTOR) 5 MG tablet Take 1 tablet (5 mg total) by mouth daily. Patient not taking: Reported on  05/26/2017 03/16/17   Ledell Noss, MD    Family History History reviewed. No pertinent family history.  Social History Social History  Substance Use Topics  . Smoking status: Never Smoker  . Smokeless tobacco: Never Used  . Alcohol use No     Allergies   Patient has no known allergies.   Review of Systems Review of Systems  Gastrointestinal: Negative for abdominal pain.  All other systems reviewed and are negative.    Physical Exam Updated Vital Signs BP 105/90 (BP Location: Right Arm)   Pulse 78   Temp 97.8 F (36.6 C) (Oral)   Resp 12   LMP 05/21/2017 (Approximate)   SpO2 100%   Physical Exam  Constitutional: She is oriented to person, place, and time. She appears well-developed and well-nourished. No distress.  HENT:  Head: Normocephalic and atraumatic.  Mouth/Throat: Oropharynx is clear and moist.  Eyes: Pupils are equal, round, and reactive to light. Conjunctivae and EOM are normal.  Neck: Normal range of motion. Neck supple.  Cardiovascular: Normal rate.   Abdominal: Soft. She exhibits no distension. There is no tenderness. There is no rebound and no guarding.  Genitourinary:    There is rash on the right labia. There is rash on the left labia. Vaginal discharge found.  Genitourinary Comments: Thick curd-like discharge present in the vaginal vault. Mild edema of the labia  Musculoskeletal: Normal range of motion. She exhibits no edema or tenderness.  Neurological: She is alert and oriented to person, place, and time.  Skin: Skin is warm and dry. No rash noted. No erythema.  Psychiatric: She has a normal mood and affect. Her behavior is normal.  Nursing note and vitals reviewed.    ED Treatments / Results  Labs (all labs ordered are listed, but only abnormal results are displayed) Labs Reviewed - No data to display  EKG  EKG Interpretation None       Radiology No results found.  Procedures Procedures (including critical care  time)  Medications Ordered in ED Medications - No data to display   Initial Impression / Assessment and Plan / ED Course  I have reviewed the triage vital signs and the nursing notes.  Pertinent labs & imaging results that were available during my care of the patient were reviewed by me and considered in my medical decision making (see chart for details).     Patient presenting here with worsening yeast infection. Low concern for STD and patient is sexually active with only 1 person. She recently was treated for a UTI and after that her symptoms worsened. Will give patient Diflucan and topical medication.  Final Clinical Impressions(s) / ED Diagnoses   Final diagnoses:  Yeast vaginitis    New Prescriptions New Prescriptions   FLUCONAZOLE (DIFLUCAN) 200 MG TABLET    Take 1 tablet (200 mg total) by mouth daily.   MICONAZOLE (MICOTIN) 2 % CREAM    Apply 1 application topically 2 (two) times daily. Do not put inside the vagina     Blanchie Dessert, MD 05/26/17 1337

## 2017-06-22 ENCOUNTER — Encounter: Payer: Self-pay | Admitting: Internal Medicine

## 2017-07-15 NOTE — Addendum Note (Signed)
Addended by: Truddie Crumble on: 07/15/2017 04:09 PM   Modules accepted: Orders

## 2017-07-18 ENCOUNTER — Emergency Department (HOSPITAL_COMMUNITY)
Admission: EM | Admit: 2017-07-18 | Discharge: 2017-07-18 | Disposition: A | Discharge status: Home / Self Care | Payer: Medicaid Other | Attending: Emergency Medicine | Admitting: Emergency Medicine

## 2017-07-18 ENCOUNTER — Encounter (HOSPITAL_COMMUNITY): Payer: Self-pay | Admitting: Emergency Medicine

## 2017-07-18 ENCOUNTER — Other Ambulatory Visit: Payer: Self-pay

## 2017-07-18 ENCOUNTER — Emergency Department (HOSPITAL_COMMUNITY): Payer: Medicaid Other

## 2017-07-18 DIAGNOSIS — Z7984 Long term (current) use of oral hypoglycemic drugs: Secondary | ICD-10-CM | POA: Diagnosis not present

## 2017-07-18 DIAGNOSIS — Z79899 Other long term (current) drug therapy: Secondary | ICD-10-CM | POA: Diagnosis not present

## 2017-07-18 DIAGNOSIS — Z9049 Acquired absence of other specified parts of digestive tract: Secondary | ICD-10-CM | POA: Insufficient documentation

## 2017-07-18 DIAGNOSIS — R0789 Other chest pain: Secondary | ICD-10-CM | POA: Insufficient documentation

## 2017-07-18 DIAGNOSIS — M791 Myalgia, unspecified site: Secondary | ICD-10-CM

## 2017-07-18 DIAGNOSIS — E119 Type 2 diabetes mellitus without complications: Secondary | ICD-10-CM | POA: Insufficient documentation

## 2017-07-18 DIAGNOSIS — Z21 Asymptomatic human immunodeficiency virus [HIV] infection status: Secondary | ICD-10-CM | POA: Diagnosis not present

## 2017-07-18 DIAGNOSIS — M7918 Myalgia, other site: Secondary | ICD-10-CM | POA: Diagnosis not present

## 2017-07-18 DIAGNOSIS — M79604 Pain in right leg: Secondary | ICD-10-CM | POA: Diagnosis present

## 2017-07-18 LAB — COMPREHENSIVE METABOLIC PANEL
ALBUMIN: 3.4 g/dL — AB (ref 3.5–5.0)
ALT: 37 U/L (ref 14–54)
AST: 45 U/L — AB (ref 15–41)
Alkaline Phosphatase: 125 U/L (ref 38–126)
Anion gap: 7 (ref 5–15)
BILIRUBIN TOTAL: 0.6 mg/dL (ref 0.3–1.2)
BUN: 7 mg/dL (ref 6–20)
CHLORIDE: 105 mmol/L (ref 101–111)
CO2: 22 mmol/L (ref 22–32)
Calcium: 9.2 mg/dL (ref 8.9–10.3)
Creatinine, Ser: 0.61 mg/dL (ref 0.44–1.00)
GFR calc Af Amer: >60 mL/min (ref >60)
GFR calc non Af Amer: >60 mL/min (ref >60)
GLUCOSE: 260 mg/dL — AB (ref 65–99)
POTASSIUM: 3.7 mmol/L (ref 3.5–5.1)
SODIUM: 134 mmol/L — AB (ref 135–145)
TOTAL PROTEIN: 8.1 g/dL (ref 6.5–8.1)

## 2017-07-18 LAB — URINALYSIS, ROUTINE W REFLEX MICROSCOPIC
BILIRUBIN URINE: NEGATIVE
Glucose, UA: >=500 mg/dL — AB
Hgb urine dipstick: NEGATIVE
KETONES UR: 5 mg/dL — AB
Nitrite: NEGATIVE
PROTEIN: NEGATIVE mg/dL
Specific Gravity, Urine: 1.036 — ABNORMAL HIGH (ref 1.005–1.030)
pH: 5 (ref 5.0–8.0)

## 2017-07-18 LAB — CBC WITH DIFFERENTIAL/PLATELET
BASOS ABS: 0 10*3/uL (ref 0.0–0.1)
BASOS PCT: 0 %
EOS ABS: 0 10*3/uL (ref 0.0–0.7)
Eosinophils Relative: 1 %
HEMATOCRIT: 35.7 % — AB (ref 36.0–46.0)
HEMOGLOBIN: 11.9 g/dL — AB (ref 12.0–15.0)
Lymphocytes Relative: 46 %
Lymphs Abs: 2.3 10*3/uL (ref 0.7–4.0)
MCH: 26.1 pg (ref 26.0–34.0)
MCHC: 33.3 g/dL (ref 30.0–36.0)
MCV: 78.3 fL (ref 78.0–100.0)
MONO ABS: 0.3 10*3/uL (ref 0.1–1.0)
Monocytes Relative: 5 %
NEUTROS ABS: 2.4 10*3/uL (ref 1.7–7.7)
NEUTROS PCT: 48 %
Platelets: 186 10*3/uL (ref 150–400)
RBC: 4.56 MIL/uL (ref 3.87–5.11)
RDW: 13.7 % (ref 11.5–15.5)
WBC: 5 10*3/uL (ref 4.0–10.5)

## 2017-07-18 LAB — POC URINE PREG, ED: PREG TEST UR: NEGATIVE

## 2017-07-18 LAB — I-STAT TROPONIN, ED: TROPONIN I, POC: 0.01 ng/mL (ref 0.00–0.08)

## 2017-07-18 LAB — CK: Total CK: 104 U/L (ref 38–234)

## 2017-07-18 MED ORDER — SODIUM CHLORIDE 0.9 % IV BOLUS (SEPSIS)
1000.0000 mL | Freq: Once | INTRAVENOUS | Status: AC
  Administered 2017-07-18: 1000 mL via INTRAVENOUS

## 2017-07-18 NOTE — ED Triage Notes (Signed)
Pt reports she has had pain on the entire R hand side of her body for the past 2 days. No extremity weakness or arm drift. No speech difficulties. No recent injuries.

## 2017-07-18 NOTE — ED Notes (Signed)
Rainbow sent down  Called lab for a Ck add on per provider order

## 2017-07-18 NOTE — ED Notes (Signed)
Added CK on to previous lab collection

## 2017-07-18 NOTE — ED Notes (Signed)
Alert oriented Uses all extremities  Clear speech noted  C/o of right and left side pain.  Verbalizes no falls or issues indicative of trama.  Hx of type diabetes and medication compliance.

## 2017-07-18 NOTE — ED Provider Notes (Signed)
[facility] DEPT Provider Note   CSN: [id number] Arrival date & time: 07/18/17  1400     History   Chief Complaint Chief Complaint  Patient presents with  . Arm Pain  . Leg Pain    HPI Beverly Johnson is a 41 y.o. female.  The history is provided by the patient and medical records. No language interpreter was used.     Beverly Johnson is a 41 y.o. female  with a PMH of HIV, DM who presents to the Emergency Department complaining of right leg, right arm and right-sided chest pain. Also endorses intermittent low back pain. Pain has been intermittent over the last couple of weeks, but today, pain intensified. She notes pain feeling more severe and sharper. This will come-and-go throughout the day. No medications taken prior to arrival for her symptoms. Her sugars have been "up-and-down", typically in the 300 range. She notes hx of similar sxs in the past -  "ever since I became a type 2 diabetic". She states at that time, she was given calcium pills for it, but that didn't help much. No fever, chills, cough, congestion, shortness of breath, abdominal pain, n/v/d, urinary symptoms, weakness or numbness.   Past Medical History:  Diagnosis Date  . Diabetes mellitus without complication ([place])   . Gallstones 03/10/2011  . HIV infection ([place])   . Immune deficiency disorder [facility])     Patient Active Problem List   Diagnosis Date Noted  . Screening examination for venereal disease 03/30/2017  . Encounter for long-term (current) use of high-risk medication 03/30/2017  . Microalbuminuria due to type 2 diabetes mellitus ([place]) 03/30/2017  . Knee pain, chronic 03/30/2017  . Chest wall pain 03/21/2017  . Vitamin D deficiency 03/21/2017  . Bilateral leg cramps 01/22/2017  . Healthcare maintenance 03/02/2014  . Morbid obesity with BMI of 45.0-49.9, adult ([place]) 04/27/2012  . Uncontrolled type 2 diabetes mellitus ([place]) 12/08/2010  . Human immunodeficiency virus  (HIV) disease ([place]) 09/29/2006    Past Surgical History:  Procedure Laterality Date  . CHOLECYSTECTOMY  06/29/2011   Procedure: LAPAROSCOPIC CHOLECYSTECTOMY WITH INTRAOPERATIVE CHOLANGIOGRAM;  Surgeon: Merrie Roof, MD;  Location: [place];  Service: General;  Laterality: N/A;  . NO PAST SURGERIES      OB History    No data available       Home Medications    Prior to Admission medications   Medication Sig Start Date End Date Taking? Authorizing Provider  ACCU-CHEK FASTCLIX LANCETS MISC Check blood sugar one time a day 03/14/17  Yes Ledell Noss, MD  b complex vitamins capsule Take 1 capsule by mouth daily. 01/22/17  Yes Minus Liberty, MD  Blood Glucose Monitoring Suppl (ACCU-CHEK NANO SMARTVIEW) w/Device KIT Check blood sugar one time a day 12/04/15  Yes Sabey Bales, MD  cholecalciferol (VITAMIN D) 1000 units tablet Take 1 tablet (1,000 Units total) by mouth daily. 03/21/17  Yes Ledell Noss, MD  elvitegravir-cobicistat-emtricitabine-tenofovir (GENVOYA) 150-150-200-10 MG TABS tablet Take 1 tablet by mouth daily with breakfast. 03/30/17  Yes Comer, Okey Regal, MD  glucose blood (ACCU-CHEK SMARTVIEW) test strip Check blood sugar one time a day 03/14/17  Yes Ledell Noss, MD  metFORMIN (GLUCOPHAGE) 500 MG tablet Take 2 tablets (1,000 mg total) by mouth 2 (two) times daily with a meal. 03/22/17 05/19/18 Yes Ledell Noss, MD  miconazole (MICOTIN) 2 % cream Apply 1 application topically 2 (two) times daily. Do not put inside the vagina 05/26/17  Yes  Blanchie Dessert, MD  metroNIDAZOLE (FLAGYL) 500 MG tablet Take 1 tablet (500 mg total) by mouth 2 (two) times daily. Patient not taking: Reported on 05/26/2017 04/03/17   Ashley Murrain, NP  omeprazole (PRILOSEC) 40 MG capsule TAKE 1 CAPSULE (40 MG TOTAL) BY MOUTH DAILY. Patient not taking: Reported on 07/18/2017 05/04/17   Ledell Noss, MD  phenazopyridine (PYRIDIUM) 200 MG tablet Take 1 tablet (200 mg total) by mouth 3 (three) times daily. Patient not  taking: Reported on 05/26/2017 04/03/17   Ashley Murrain, NP  rosuvastatin (CRESTOR) 5 MG tablet Take 1 tablet (5 mg total) by mouth daily. Patient not taking: Reported on 05/26/2017 03/16/17   Ledell Noss, MD    Family History History reviewed. No pertinent family history.  Social History Social History   Tobacco Use  . Smoking status: Never Smoker  . Smokeless tobacco: Never Used  Substance Use Topics  . Alcohol use: No  . Drug use: No     Allergies   Patient has no known allergies.   Review of Systems Review of Systems  Respiratory: Negative for shortness of breath.   Cardiovascular: Positive for chest pain. Negative for palpitations and leg swelling.  Musculoskeletal: Positive for back pain and myalgias. Negative for neck pain.  All other systems reviewed and are negative.    Physical Exam Updated Vital Signs BP 118/77 (BP Location: Left Arm)   Pulse 77   Temp 97.9 F (36.6 C) (Oral)   Resp 18   LMP 06/19/2017   SpO2 100%   Physical Exam  Constitutional: She is oriented to person, place, and time. She appears well-developed and well-nourished.  Neck:  No midline or paraspinal tenderness. Full ROM without pain.  Cardiovascular: Normal rate, regular rhythm, normal heart sounds and intact distal pulses.  Pulmonary/Chest: Effort normal and breath sounds normal. No respiratory distress. She has no wheezes. She has no rales. She exhibits tenderness (Across entire chest wall).  Abdominal: Soft. Bowel sounds are normal. She exhibits no distension. There is no tenderness.  Musculoskeletal:  Tenderness to palpation diffusely across low back. 5/5 muscle strength in all 4 extremities. Straight  leg raises are  negative bilaterally for radicular symptoms. Able to ambulate independently with steady gait.  Neurological: She is alert and oriented to person, place, and time. She has normal reflexes.  All four extremities neurovascularly intact with equal sensation.   Skin: Skin  is warm and dry. No rash noted. No erythema.  Nursing note and vitals reviewed.    ED Treatments / Results  Labs (all labs ordered are listed, but only abnormal results are displayed) Labs Reviewed  CBC WITH DIFFERENTIAL/PLATELET - Abnormal; Notable for the following components:      Result Value   Hemoglobin 11.9 (*)    HCT 35.7 (*)    All other components within normal limits  COMPREHENSIVE METABOLIC PANEL - Abnormal; Notable for the following components:   Sodium 134 (*)    Glucose, Bld 260 (*)    Albumin 3.4 (*)    AST 45 (*)    All other components within normal limits  URINALYSIS, ROUTINE W REFLEX MICROSCOPIC - Abnormal; Notable for the following components:   APPearance HAZY (*)    Specific Gravity, Urine 1.036 (*)    Glucose, UA >=500 (*)    Ketones, ur 5 (*)    Leukocytes, UA TRACE (*)    Bacteria, UA RARE (*)    Squamous Epithelial / LPF 6-30 (*)    All  other components within normal limits  CK  POC URINE PREG, ED  I-STAT TROPONIN, ED    EKG  EKG Interpretation  Date/Time:  Sunday July 18 2017 19:57:29 EST Ventricular Rate:  80 PR Interval:    QRS Duration: 92 QT Interval:  354 QTC Calculation: 409 R Axis:   39 Text Interpretation:  Sinus rhythm Confirmed by Julianne Rice [phone]) on 07/18/2017 10:37:03 PM       Radiology Dg Lumbar Spine Complete  Result Date: 07/18/2017 CLINICAL DATA:  Low back pain for couple of years.  No trauma. EXAM: LUMBAR SPINE - COMPLETE 4+ VIEW COMPARISON:  None. FINDINGS: There are 6 non rib-bearing lumbar type vertebral bodies. Transitional anatomy in the upper sacrum. No fracture or traumatic malalignment. No obvious pars defect on oblique views. No significant degenerative changes. IMPRESSION: Negative. Electronically Signed   By: Dorise Bullion III M.D   On: 07/18/2017 21:32    Procedures Procedures (including critical care time)  Medications Ordered in ED Medications  sodium chloride 0.9 % bolus 1,000 mL  (1,000 mLs Intravenous New Bag/Given 07/18/17 2157)     Initial Impression / Assessment and Plan / ED Course  I have reviewed the triage vital signs and the nursing notes.  Pertinent labs & imaging results that were available during my care of the patient were reviewed by me and considered in my medical decision making (see chart for details).    Beverly Johnson is a 41 y.o. female who presents to ED for right arm pain, right leg pain and right-sided chest wall pain for a few weeks, but acutely worsened today. Tenderness along chest wall, but otherwise normal examination. Strength and sensation intact and equal. Labs reviewed and reassuring. X-ray negative. EKG NSR. Evaluation does not show pathology that would require ongoing emergent intervention or inpatient treatment. PCP follow up strongly encouraged. Return precautions discussed. All questions answered.  Patient discussed with Dr. Lita Mains who agrees with treatment plan.    Final Clinical Impressions(s) / ED Diagnoses   Final diagnoses:  Myalgia  Chest wall pain    ED Discharge Orders    None       Rosalba Totty, Ozella Almond, PA-C 07/18/17 2238    Julianne Rice, MD 07/27/17 1500

## 2017-07-18 NOTE — ED Notes (Signed)
Meal given: turkey/ ham sandwitch  Crackers and ginger ale

## 2017-07-18 NOTE — Discharge Instructions (Signed)
It was my pleasure taking care of you today!   Fortunately, your labs and imaging were very reassuring today.  Keep a close eye on your blood sugars and follow a low-carb diabetic diet.  Stay hydrated.   Call your primary care doctor in the morning to schedule a follow up appointment for further discussion about today's visit.   Return to ER for new or worsening symptoms, any additional concerns.

## 2017-07-19 ENCOUNTER — Encounter (HOSPITAL_COMMUNITY): Payer: Self-pay | Admitting: Emergency Medicine

## 2017-07-19 ENCOUNTER — Emergency Department (HOSPITAL_COMMUNITY)
Admission: EM | Admit: 2017-07-19 | Discharge: 2017-07-19 | Disposition: A | Discharge status: Home / Self Care | Payer: Medicaid Other | Attending: Emergency Medicine | Admitting: Emergency Medicine

## 2017-07-19 ENCOUNTER — Emergency Department (HOSPITAL_COMMUNITY): Payer: Medicaid Other

## 2017-07-19 ENCOUNTER — Other Ambulatory Visit: Payer: Self-pay

## 2017-07-19 DIAGNOSIS — R0789 Other chest pain: Secondary | ICD-10-CM | POA: Diagnosis present

## 2017-07-19 DIAGNOSIS — Z5321 Procedure and treatment not carried out due to patient leaving prior to being seen by health care provider: Secondary | ICD-10-CM | POA: Diagnosis not present

## 2017-07-19 LAB — BASIC METABOLIC PANEL
Anion gap: 9 (ref 5–15)
BUN: 6 mg/dL (ref 6–20)
CHLORIDE: 102 mmol/L (ref 101–111)
CO2: 19 mmol/L — ABNORMAL LOW (ref 22–32)
Calcium: 9.6 mg/dL (ref 8.9–10.3)
Creatinine, Ser: 0.73 mg/dL (ref 0.44–1.00)
GFR calc Af Amer: >60 mL/min (ref >60)
GFR calc non Af Amer: >60 mL/min (ref >60)
Glucose, Bld: 365 mg/dL — ABNORMAL HIGH (ref 65–99)
POTASSIUM: 3.6 mmol/L (ref 3.5–5.1)
SODIUM: 130 mmol/L — AB (ref 135–145)

## 2017-07-19 LAB — CBC
HEMATOCRIT: 37.7 % (ref 36.0–46.0)
Hemoglobin: 12.2 g/dL (ref 12.0–15.0)
MCH: 25.5 pg — ABNORMAL LOW (ref 26.0–34.0)
MCHC: 32.4 g/dL (ref 30.0–36.0)
MCV: 78.7 fL (ref 78.0–100.0)
Platelets: 196 10*3/uL (ref 150–400)
RBC: 4.79 MIL/uL (ref 3.87–5.11)
RDW: 14.1 % (ref 11.5–15.5)
WBC: 5.1 10*3/uL (ref 4.0–10.5)

## 2017-07-19 LAB — I-STAT TROPONIN, ED: Troponin i, poc: 0.01 ng/mL (ref 0.00–0.08)

## 2017-07-19 LAB — I-STAT BETA HCG BLOOD, ED (MC, WL, AP ONLY)

## 2017-07-19 LAB — CBG MONITORING, ED: Glucose-Capillary: 334 mg/dL — ABNORMAL HIGH (ref 65–99)

## 2017-07-19 NOTE — ED Notes (Signed)
Pt stated she was leaving, she will see her "diabeties Dr. Marylene Buerger."

## 2017-07-19 NOTE — Progress Notes (Deleted)
[place]  Foot exam  Eye exam  Flu vaccine  Lab: A1c, vitamin D   75 F HIV, morbid obesity, uncontrolled T2 DM with proteinuria   Vitamin D deficiency  Presented with cramping leg pain, found to have Vitamin D level 17.5 and vitamin D 1000 units qd was prescribed. Due for follow up of vitamin D level.   Uncontrolled Type 2 Diabetes Mellitus with proteinuria  Last A1c 02/2017 11.7.  - metformin 1000 mg BID  - repeat urine microalbumin/ crt  - start ace- I

## 2017-07-19 NOTE — ED Triage Notes (Signed)
Per [facility] patient was on bus today and started having left sided chest pain that radiates down left arm accompanied by nausea that was resolved before ems arrived. Ekg unremarkable with ems. Patient received 324mg  aspirin with ems. Hx diabetes. Glucose 407 with ems.

## 2017-07-20 ENCOUNTER — Encounter: Payer: Self-pay | Admitting: Internal Medicine

## 2017-07-28 ENCOUNTER — Encounter: Payer: Self-pay | Admitting: Internal Medicine

## 2017-08-05 ENCOUNTER — Other Ambulatory Visit: Payer: Self-pay

## 2017-08-05 ENCOUNTER — Ambulatory Visit (INDEPENDENT_AMBULATORY_CARE_PROVIDER_SITE_OTHER): Payer: Medicaid Other | Admitting: Internal Medicine

## 2017-08-05 VITALS — BP 114/77 | HR 79 | Temp 97.9°F | Ht 68.0 in | Wt 268.5 lb

## 2017-08-05 DIAGNOSIS — E1165 Type 2 diabetes mellitus with hyperglycemia: Secondary | ICD-10-CM

## 2017-08-05 DIAGNOSIS — E669 Obesity, unspecified: Secondary | ICD-10-CM | POA: Diagnosis not present

## 2017-08-05 DIAGNOSIS — Z6841 Body Mass Index (BMI) 40.0 and over, adult: Secondary | ICD-10-CM | POA: Diagnosis not present

## 2017-08-05 DIAGNOSIS — Z7984 Long term (current) use of oral hypoglycemic drugs: Secondary | ICD-10-CM | POA: Diagnosis not present

## 2017-08-05 DIAGNOSIS — E1169 Type 2 diabetes mellitus with other specified complication: Secondary | ICD-10-CM

## 2017-08-05 LAB — POCT GLYCOSYLATED HEMOGLOBIN (HGB A1C): Hemoglobin A1C: 13.6

## 2017-08-05 LAB — GLUCOSE, CAPILLARY: Glucose-Capillary: 276 mg/dL — ABNORMAL HIGH (ref 65–99)

## 2017-08-05 MED ORDER — SITAGLIPTIN PHOSPHATE 100 MG PO TABS: 100.0000 mg | ORAL_TABLET | Freq: Every day | ORAL | 11 refills | Status: DC

## 2017-08-05 NOTE — Assessment & Plan Note (Addendum)
Pastient with uncotrolled DM. A1c Today 13.6; up from Last A1c of 11.7 (03/16/2017). Patient currently taking Metformin 1000mg  BID and reports adherance with this medication. She has been offered additional agents in the past but refused. She has tried some dietary changes, but this has not helped as her A1c continue to worsen. The long term effect of diabetes (damage to eyes, kidneys, and nerves) were reviewed with her. Today she states she is still unwilling to take injectable medications but would be open to adding a second oral agent. - Continue Metformin 1000mg , BID - Start Sitagliptin 100mg , Daily - Continue Dietary and Lifestyle changes - Follow up with PCP in January

## 2017-08-05 NOTE — Patient Instructions (Addendum)
Thank you for allowing [place] to care for you  For your Diabetes: - Continue to take Metformin 1000mg , Twice a day - We have prescribed Sitagliptin 100mg , Daily - Continue Diet and exercise  Return in January for follow up with your PCP   Diabetes Mellitus and Nutrition When you have diabetes (diabetes mellitus), it is very important to have healthy eating habits because your blood sugar (glucose) levels are greatly affected by what you eat and drink. Eating healthy foods in the appropriate amounts, at about the same times every day, can help you:  Control your blood glucose.  Lower your risk of heart disease.  Improve your blood pressure.  Reach or maintain a healthy weight.  Every person with diabetes is different, and each person has different needs for a meal plan. Your health care provider may recommend that you work with a diet and nutrition specialist (dietitian) to make a meal plan that is best for you. Your meal plan may vary depending on factors such as:  The calories you need.  The medicines you take.  Your weight.  Your blood glucose, blood pressure, and cholesterol levels.  Your activity level.  Other health conditions you have, such as heart or kidney disease.  How do carbohydrates affect me? Carbohydrates affect your blood glucose level more than any other type of food. Eating carbohydrates naturally increases the amount of glucose in your blood. Carbohydrate counting is a method for keeping track of how many carbohydrates you eat. Counting carbohydrates is important to keep your blood glucose at a healthy level, especially if you use insulin or take certain oral diabetes medicines. It is important to know how many carbohydrates you can safely have in each meal. This is different for every person. Your dietitian can help you calculate how many carbohydrates you should have at each meal and for snack. Foods that contain carbohydrates include:  Bread, cereal, rice,  pasta, and crackers.  Potatoes and corn.  Peas, beans, and lentils.  Milk and yogurt.  Fruit and juice.  Desserts, such as cakes, cookies, ice cream, and candy.  How does alcohol affect me? Alcohol can cause a sudden decrease in blood glucose (hypoglycemia), especially if you use insulin or take certain oral diabetes medicines. Hypoglycemia can be a life-threatening condition. Symptoms of hypoglycemia (sleepiness, dizziness, and confusion) are similar to symptoms of having too much alcohol. If your health care provider says that alcohol is safe for you, follow these guidelines:  Limit alcohol intake to no more than 1 drink per day for nonpregnant women and 2 drinks per day for men. One drink equals 12 oz of beer, 5 oz of wine, or 1 oz of hard liquor.  Do not drink on an empty stomach.  Keep yourself hydrated with water, diet soda, or unsweetened iced tea.  Keep in mind that regular soda, juice, and other mixers may contain a lot of sugar and must be counted as carbohydrates.  What are tips for following this plan? Reading food labels  Start by checking the serving size on the label. The amount of calories, carbohydrates, fats, and other nutrients listed on the label are based on one serving of the food. Many foods contain more than one serving per package.  Check the total grams (g) of carbohydrates in one serving. You can calculate the number of servings of carbohydrates in one serving by dividing the total carbohydrates by 15. For example, if a food has 30 g of total carbohydrates, it would be  equal to 2 servings of carbohydrates.  Check the number of grams (g) of saturated and trans fats in one serving. Choose foods that have low or no amount of these fats.  Check the number of milligrams (mg) of sodium in one serving. Most people should limit total sodium intake to less than 2,300 mg per day.  Always check the nutrition information of foods labeled as "low-fat" or "nonfat".  These foods may be higher in added sugar or refined carbohydrates and should be avoided.  Talk to your dietitian to identify your daily goals for nutrients listed on the label. Shopping  Avoid buying canned, premade, or processed foods. These foods tend to be high in fat, sodium, and added sugar.  Shop around the outside edge of the grocery store. This includes fresh fruits and vegetables, bulk grains, fresh meats, and fresh dairy. Cooking  Use low-heat cooking methods, such as baking, instead of high-heat cooking methods like deep frying.  Cook using healthy oils, such as olive, canola, or sunflower oil.  Avoid cooking with butter, cream, or high-fat meats. Meal planning  Eat meals and snacks regularly, preferably at the same times every day. Avoid going long periods of time without eating.  Eat foods high in fiber, such as fresh fruits, vegetables, beans, and whole grains. Talk to your dietitian about how many servings of carbohydrates you can eat at each meal.  Eat 4-6 ounces of lean protein each day, such as lean meat, chicken, fish, eggs, or tofu. 1 ounce is equal to 1 ounce of meat, chicken, or fish, 1 egg, or 1/4 cup of tofu.  Eat some foods each day that contain healthy fats, such as avocado, nuts, seeds, and fish. Lifestyle   Check your blood glucose regularly.  Exercise at least 30 minutes 5 or more days each week, or as told by your health care provider.  Take medicines as told by your health care provider.  Do not use any products that contain nicotine or tobacco, such as cigarettes and e-cigarettes. If you need help quitting, ask your health care provider.  Work with a Social worker or diabetes educator to identify strategies to manage stress and any emotional and social challenges. What are some questions to ask my health care provider?  Do I need to meet with a diabetes educator?  Do I need to meet with a dietitian?  What number can I call if I have  questions?  When are the best times to check my blood glucose? Where to find more information:  American Diabetes Association: diabetes.org/food-and-fitness/food  Academy of Nutrition and Dietetics: PokerClues.dk  [employer] of Diabetes and Digestive and Kidney Diseases (NIH): ContactWire.be Summary  A healthy meal plan will help you control your blood glucose and maintain a healthy lifestyle.  Working with a diet and nutrition specialist (dietitian) can help you make a meal plan that is best for you.  Keep in mind that carbohydrates and alcohol have immediate effects on your blood glucose levels. It is important to count carbohydrates and to use alcohol carefully. This information is not intended to replace advice given to you by your health care provider. Make sure you discuss any questions you have with your health care provider. Document Released: 04/30/2005 Document Revised: 09/07/2016 Document Reviewed: 09/07/2016 Elsevier Interactive Patient Education  [employer].

## 2017-08-05 NOTE — Progress Notes (Signed)
   CC: Diabetes  HPI:  [name] is a 41 y.o. F with PMHx listed below presenting for Diabetes. Please see the A&P for the status of the patient's chronic medical problems.   Past Medical History:  Diagnosis Date  . Diabetes mellitus without complication ([place])   . Gallstones 03/10/2011  . HIV infection ([place])   . Immune deficiency disorder ([place])    Review of Systems: Performed and all others negative.  Physical Exam:  Vitals:   08/05/17 1110  BP: 114/77  Pulse: 79  Temp: 97.9 F (36.6 C)  TempSrc: Oral  SpO2: 100%  Weight: 268 lb 8 oz (121.8 kg)  Height: 5\' 8"  (1.727 m)   Physical Exam  Constitutional: She appears well-developed and well-nourished.  Obese Female  Cardiovascular: Normal rate, regular rhythm, normal heart sounds and intact distal pulses.  Pulmonary/Chest: Effort normal and breath sounds normal. No respiratory distress.  Abdominal: Soft. Bowel sounds are normal. She exhibits no distension. There is no tenderness.    Assessment & Plan:   See Encounters Tab for problem based charting.  Patient seen with Dr. Lynnae Johnson

## 2017-08-06 NOTE — Progress Notes (Signed)
Internal Medicine Clinic Attending  I saw and evaluated the patient.  I personally confirmed the key portions of the history and exam documented by Dr. Melvin and I reviewed pertinent patient test results.  The assessment, diagnosis, and plan were formulated together and I agree with the documentation in the resident's note.  

## 2017-08-31 ENCOUNTER — Encounter: Payer: Medicaid Other | Admitting: Internal Medicine

## 2017-09-01 ENCOUNTER — Telehealth: Payer: Self-pay | Admitting: *Deleted

## 2017-09-01 NOTE — Telephone Encounter (Signed)
Breast lump found by research MD at [facility] where patient is being followed in a study.  She was advised to contact [facility] to see a provider for a referral for a diagnostic mammogram.  The patient is complaining of breast pain.  Patient given appointment with S. Dixon for 09/02/17 for this problem.

## 2017-09-02 ENCOUNTER — Encounter: Payer: Self-pay | Admitting: Infectious Diseases

## 2017-09-02 ENCOUNTER — Other Ambulatory Visit: Payer: Self-pay | Admitting: Infectious Diseases

## 2017-09-02 ENCOUNTER — Ambulatory Visit (INDEPENDENT_AMBULATORY_CARE_PROVIDER_SITE_OTHER): Payer: Medicaid Other | Admitting: Infectious Diseases

## 2017-09-02 VITALS — Temp 98.0°F | Wt 267.0 lb

## 2017-09-02 DIAGNOSIS — N6321 Unspecified lump in the left breast, upper outer quadrant: Secondary | ICD-10-CM

## 2017-09-02 DIAGNOSIS — N63 Unspecified lump in unspecified breast: Secondary | ICD-10-CM

## 2017-09-02 NOTE — Patient Instructions (Signed)
We will help get you scheduled for a diagnostic mammogram today.   You may need further testing after this like an ultrasound or a biopsy.

## 2017-09-02 NOTE — Assessment & Plan Note (Signed)
1 cm firm nodule palpated to left upper quadrant of her left breast at the 2 o'clock position. No discernable lymph node swelling to axillae or clavicular nodes but left axilla tender. No family history of breast cancer from what she can recall. She does also have bilateral fibrous breast tissue under both breasts that is symmetric - I reassured her that the tissue she is feeling under her breasts is normal however I am concerned about the nodule.   Will place order for diagnostic mammogram of the left breast today. Counseled she may need additional testing to work this up thoroughly.

## 2017-09-02 NOTE — Progress Notes (Signed)
Beverly Johnson  1976-07-08 [id number] Ledell Noss, MD   Reason for Visit: Painful breast lump   Patient Active Problem List   Diagnosis Date Noted  . Breast nodule 09/02/2017  . Encounter for long-term (current) use of high-risk medication 03/30/2017  . Microalbuminuria due to type 2 diabetes mellitus ([place]) 03/30/2017  . Knee pain, chronic 03/30/2017  . Chest wall pain 03/21/2017  . Vitamin D deficiency 03/21/2017  . Bilateral leg cramps 01/22/2017  . Healthcare maintenance 03/02/2014  . Morbid obesity with BMI of 45.0-49.9, adult ([place]) 04/27/2012  . Uncontrolled type 2 diabetes mellitus ([place]) 12/08/2010  . Human immunodeficiency virus (HIV) disease ([place]) 09/29/2006    Patient's Medications  New Prescriptions   No medications on file  Previous Medications   B COMPLEX VITAMINS CAPSULE    Take 1 capsule by mouth daily.   CHOLECALCIFEROL (VITAMIN D) 1000 UNITS TABLET    Take 1 tablet (1,000 Units total) by mouth daily.   ELVITEGRAVIR-COBICISTAT-EMTRICITABINE-TENOFOVIR (GENVOYA) 150-150-200-10 MG TABS TABLET    Take 1 tablet by mouth daily with breakfast.   GLUCOSE BLOOD (ACCU-CHEK SMARTVIEW) TEST STRIP    Check blood sugar one time a day   METFORMIN (GLUCOPHAGE) 500 MG TABLET    Take 2 tablets (1,000 mg total) by mouth 2 (two) times daily with a meal.   MICONAZOLE (MICOTIN) 2 % CREAM    Apply 1 application topically 2 (two) times daily. Do not put inside the vagina   OMEPRAZOLE (PRILOSEC) 40 MG CAPSULE    TAKE 1 CAPSULE (40 MG TOTAL) BY MOUTH DAILY.   PHENAZOPYRIDINE (PYRIDIUM) 200 MG TABLET    Take 1 tablet (200 mg total) by mouth 3 (three) times daily.   SITAGLIPTIN (JANUVIA) 100 MG TABLET    Take 1 tablet (100 mg total) by mouth daily.  Modified Medications   No medications on file  Discontinued Medications   No medications on file    HPI:  Beverly Johnson is here today at the suggestion of her provider at [facility] team (she is in an HIV study there) after discussion with her  recently regarding a painful breast lump. She tells me that she does breast exams most months and feels this has been present for almost a year now. Always in the same spot and does not change in size/quality or location during menses. She does also have some breast tenderness in lower portions of both breasts. Denies any fevers, night sweats, weight loss, or adenopathy but does report tender left axillae.   She is not currently taking oral contraceptives. Nulliparous. Regular menstrual cycles with most recent cycle 08/27/2017.   Review of Systems  All other systems reviewed and are negative.   Past Medical History:  Diagnosis Date  . Diabetes mellitus without complication ([place])   . Gallstones 03/10/2011  . HIV infection ([place])   . Immune deficiency disorder ([place])     No Known Allergies  Social History   Tobacco Use  . Smoking status: Never Smoker  . Smokeless tobacco: Never Used  Substance Use Topics  . Alcohol use: No  . Drug use: No    No family history on file.  Social History   Substance and Sexual Activity  Sexual Activity Not on file   Comment: pt. declined condoms    Physical Exam and Objective Findings:  Vitals:   09/02/17 0856  Temp: 98 F (36.7 C)  TempSrc: Oral  Weight: 267 lb (121.1 kg)   Body mass index is 40.6 kg/m.  Physical Exam  Constitutional: She is oriented to person, place, and time and well-developed, well-nourished, and in no distress.  Pulmonary/Chest:    Lymphadenopathy:    She has no axillary adenopathy.       Right: No supraclavicular adenopathy present.       Left: No supraclavicular and no epitrochlear adenopathy present.  Neurological: She is alert and oriented to person, place, and time.  Skin: Skin is warm and dry.  Vitals reviewed.   Lab Results Lab Results  Component Value Date   WBC 5.1 07/19/2017   HGB 12.2 07/19/2017   HCT 37.7 07/19/2017   MCV 78.7 07/19/2017   PLT 196 07/19/2017    Lab Results  Component  Value Date   CREATININE 0.73 07/19/2017   BUN 6 07/19/2017   NA 130 (L) 07/19/2017   K 3.6 07/19/2017   CL 102 07/19/2017   CO2 19 (L) 07/19/2017    Lab Results  Component Value Date   ALT 37 07/18/2017   AST 45 (H) 07/18/2017   ALKPHOS 125 07/18/2017   BILITOT 0.6 07/18/2017    Lab Results  Component Value Date   CHOL 93 03/30/2017   HDL 40 (L) 03/30/2017   LDLCALC 22 03/30/2017   TRIG 153 (H) 03/30/2017   CHOLHDL 2.3 03/30/2017   HIV 1 RNA Quant (copies/mL)  Date Value  03/30/2017 <20 DETECTED (A)  03/30/2016 <20  07/02/2015 <20   CD4 T Cell Abs (/uL)  Date Value  03/30/2017 670  03/30/2016 530  07/02/2015 580   No results found for: HAV Lab Results  Component Value Date   HEPBSAG NO 10/11/2006   HEPBSAB NO 10/11/2006   Lab Results  Component Value Date   HCVAB NO 10/11/2006   Lab Results  Component Value Date   CHLAMYDIAWP Negative 04/03/2017   N Negative 04/03/2017   No results found for: GCPROBEAPT No results found for: QUANTGOLD No results found for: RPR    Problem List Items Addressed This Visit      Other   Breast nodule - Primary    1 cm firm nodule palpated to left upper quadrant of her left breast at the 2 o'clock position. No discernable lymph node swelling to axillae or clavicular nodes but left axilla tender. No family history of breast cancer from what she can recall. She does also have bilateral fibrous breast tissue under both breasts that is symmetric - I reassured her that the tissue she is feeling under her breasts is normal however I am concerned about the nodule.   Will place order for diagnostic mammogram of the left breast today. Counseled she may need additional testing to work this up thoroughly.       Relevant Orders   MM Digital Diagnostic Unilat L     Follow up with Dr. Linus Salmons as previously scheduled. Will help coordinate care for her should she need further diagnostics and treatment for this.   Janene Madeira, MSN,  NP-C [facility] [place] [facility] Pager: [phone]  09/02/2017  9:15 AM

## 2017-09-08 ENCOUNTER — Ambulatory Visit
Admission: RE | Admit: 2017-09-08 | Discharge: 2017-09-08 | Disposition: A | Discharge status: Home / Self Care | Payer: Medicaid Other | Source: Ambulatory Visit | Attending: Infectious Diseases | Admitting: Infectious Diseases

## 2017-09-08 ENCOUNTER — Other Ambulatory Visit: Payer: Self-pay | Admitting: Infectious Diseases

## 2017-09-08 DIAGNOSIS — N63 Unspecified lump in unspecified breast: Secondary | ICD-10-CM

## 2017-09-16 ENCOUNTER — Other Ambulatory Visit: Payer: Self-pay

## 2017-09-16 ENCOUNTER — Emergency Department (HOSPITAL_COMMUNITY): Payer: Medicaid Other

## 2017-09-16 ENCOUNTER — Encounter (HOSPITAL_COMMUNITY): Payer: Self-pay | Admitting: Emergency Medicine

## 2017-09-16 ENCOUNTER — Emergency Department (HOSPITAL_COMMUNITY)
Admission: EM | Admit: 2017-09-16 | Discharge: 2017-09-16 | Disposition: A | Discharge status: Home / Self Care | Payer: Medicaid Other | Attending: Emergency Medicine | Admitting: Emergency Medicine

## 2017-09-16 DIAGNOSIS — Z7984 Long term (current) use of oral hypoglycemic drugs: Secondary | ICD-10-CM | POA: Diagnosis not present

## 2017-09-16 DIAGNOSIS — Z79899 Other long term (current) drug therapy: Secondary | ICD-10-CM | POA: Diagnosis not present

## 2017-09-16 DIAGNOSIS — B20 Human immunodeficiency virus [HIV] disease: Secondary | ICD-10-CM | POA: Diagnosis not present

## 2017-09-16 DIAGNOSIS — G8929 Other chronic pain: Secondary | ICD-10-CM | POA: Diagnosis not present

## 2017-09-16 DIAGNOSIS — M25561 Pain in right knee: Secondary | ICD-10-CM | POA: Insufficient documentation

## 2017-09-16 DIAGNOSIS — E1165 Type 2 diabetes mellitus with hyperglycemia: Secondary | ICD-10-CM | POA: Insufficient documentation

## 2017-09-16 LAB — CBG MONITORING, ED: GLUCOSE-CAPILLARY: 288 mg/dL — AB (ref 65–99)

## 2017-09-16 NOTE — ED Provider Notes (Signed)
[facility] DEPT Provider Note   CSN: [id number] Arrival date & time: 09/16/17  1151     History   Chief Complaint Chief Complaint  Patient presents with  . Leg Pain    HPI Beverly Johnson is a 42 y.o. female with a h/o of HIV (CD4 670 on 08/18), uncontrolled DM (A1C 13.6 on 12/18), chronic low back pain, and obesity who presents to the emergency department with a chief complaint of right knee pain.  She reports the pain has been present for several years, but has significantly worsened over the last few days.  Pain is worse with full extension of the knee, standing, walking, and weightbearing and alleviated by nothing.  The pain begins at the knee and will intermittently radiate up the thigh or down to the calf. No new trauma or injury.  She denies swelling, warmth, or redness to the joint.  No right hip or ankle pain, numbness, weakness, fever, chills, or left lower extremity pain.  She was previously seen by her PCP for cramping in the bilateral lower extremities over the last year, but reports that this has improved.  She has treated her symptoms with Motrin and Tylenol without improvement.  No history of gout or DVT.  No family history of DVT.   The history is provided by the patient. No language interpreter was used.  Leg Pain   This is a chronic problem. The current episode started more than 1 week ago. The problem occurs constantly. The problem has been gradually worsening. The pain is present in the right knee. The quality of the pain is described as sharp. The pain is severe. Pertinent negatives include no numbness and full range of motion. The symptoms are aggravated by standing, lying down and activity. She has tried OTC pain medications for the symptoms. The treatment provided no relief. There has been no history of extremity trauma. Family history is significant for no gout.    Past Medical History:  Diagnosis Date  . Diabetes mellitus without  complication ([place])   . Gallstones 03/10/2011  . HIV infection ([place])   . Immune deficiency disorder [facility])     Patient Active Problem List   Diagnosis Date Noted  . Breast nodule 09/02/2017  . Encounter for long-term (current) use of high-risk medication 03/30/2017  . Microalbuminuria due to type 2 diabetes mellitus ([place]) 03/30/2017  . Knee pain, chronic 03/30/2017  . Chest wall pain 03/21/2017  . Vitamin D deficiency 03/21/2017  . Bilateral leg cramps 01/22/2017  . Healthcare maintenance 03/02/2014  . Morbid obesity with BMI of 45.0-49.9, adult ([place]) 04/27/2012  . Uncontrolled type 2 diabetes mellitus ([place]) 12/08/2010  . Human immunodeficiency virus (HIV) disease ([place]) 09/29/2006    Past Surgical History:  Procedure Laterality Date  . CHOLECYSTECTOMY  06/29/2011   Procedure: LAPAROSCOPIC CHOLECYSTECTOMY WITH INTRAOPERATIVE CHOLANGIOGRAM;  Surgeon: Merrie Roof, MD;  Location: [facility];  Service: General;  Laterality: N/A;  . NO PAST SURGERIES      OB History    No data available       Home Medications    Prior to Admission medications   Medication Sig Start Date End Date Taking? Authorizing Provider  elvitegravir-cobicistat-emtricitabine-tenofovir (GENVOYA) 150-150-200-10 MG TABS tablet Take 1 tablet by mouth daily with breakfast. 03/30/17  Yes Comer, Okey Regal, MD  metFORMIN (GLUCOPHAGE) 500 MG tablet Take 2 tablets (1,000 mg total) by mouth 2 (two) times daily with a meal. 03/22/17 05/19/18 Yes Ledell Noss, MD  sitaGLIPtin (JANUVIA) 100 MG tablet Take 1 tablet (100 mg total) by mouth daily. 08/05/17  Yes Neva Seat, MD  glucose blood (ACCU-CHEK SMARTVIEW) test strip Check blood sugar one time a day 03/14/17   Ledell Noss, MD    Family History No family history on file.  Social History Social History   Tobacco Use  . Smoking status: Never Smoker  . Smokeless tobacco: Never Used  Substance Use Topics  . Alcohol use: No  . Drug use: No     Allergies   Patient  has no known allergies.   Review of Systems Review of Systems  Constitutional: Negative for activity change, chills and fever.  Respiratory: Negative for shortness of breath.   Cardiovascular: Negative for chest pain.  Gastrointestinal: Negative for abdominal pain.  Musculoskeletal: Positive for arthralgias and gait problem. Negative for back pain, joint swelling and myalgias.  Skin: Negative for color change, rash and wound.  Neurological: Negative for weakness and numbness.     Physical Exam Updated Vital Signs BP (!) 126/100   Pulse 93   Temp (!) 97.3 F (36.3 C) (Oral)   Resp 16   LMP 08/27/2017 (Exact Date)   SpO2 100%   Physical Exam  Constitutional: No distress.  HENT:  Head: Normocephalic.  Eyes: Conjunctivae are normal.  Neck: Neck supple.  Cardiovascular: Normal rate and regular rhythm. Exam reveals no gallop and no friction rub.  No murmur heard. Pulmonary/Chest: Effort normal. No respiratory distress.  Abdominal: Soft. She exhibits no distension.  Musculoskeletal: Normal range of motion. She exhibits tenderness. She exhibits no edema or deformity.  Tender to palpation over the medial lateral joint line of the right knee, lateral greater than medial.  No tenderness to palpation to the quadriceps or patellar tendon or posterior knee pain.  Full active and passive range of motion of the joint with pain.  Negative anterior and posterior drawer test.  Negative valgus and varus stress test.  The patella tracks well.  No overlying erythema, edema, warmth, or ecchymosis.  5 out of 5 strength of the bilateral upper and lower extremities against resistance with dorsiflexion plantarflexion.  DP and PT pulses are 2+ and symmetric.  Sensation is intact throughout the bilateral lower extremities.  Antalgic gait.  Independently moves all digits of both feet.  Neurological: She is alert.  Skin: Skin is warm. No rash noted.  Psychiatric: Her behavior is normal.  Nursing note and  vitals reviewed.    ED Treatments / Results  Labs (all labs ordered are listed, but only abnormal results are displayed) Labs Reviewed  CBG MONITORING, ED - Abnormal; Notable for the following components:      Result Value   Glucose-Capillary 288 (*)    All other components within normal limits    EKG  EKG Interpretation None       Radiology Dg Knee Complete 4 Views Right  Result Date: 09/16/2017 CLINICAL DATA:  Acute right knee pain without known injury. EXAM: RIGHT KNEE - COMPLETE 4+ VIEW COMPARISON:  None. FINDINGS: No evidence of fracture, dislocation, or joint effusion. No evidence of arthropathy or other focal bone abnormality. Soft tissues are unremarkable. IMPRESSION: No significant abnormality seen in the right knee. Electronically Signed   By: Marijo Conception, M.D.   On: 09/16/2017 13:16    Procedures Procedures (including critical care time)  Medications Ordered in ED Medications - No data to display   Initial Impression / Assessment and Plan / ED Course  I have  reviewed the triage vital signs and the nursing notes.  Pertinent labs & imaging results that were available during my care of the patient were reviewed by me and considered in my medical decision making (see chart for details).     42 year old female with a h/o of HIV (CD4 670 on 08/18), uncontrolled DM (A1C 13.6 on 12/18), chronic low back pain, and obesity presenting with right knee pain "for years" that worsened without trauma or injury over the last fews.  Physical exam, the patient has full active and passive range of motion of the right knee, ankle, and hip without erythema, edema, warmth, or ecchymosis.  She is tender to palpation over the medial and lateral joint line. no constitutional symptoms and she is afebrile and hemodynamically stable today.  CBG 288, which is consistent with her home readings recently and appears consistent with previous CBG checks.  No signs or symptoms of DKA.  She  declined Tylenol for pain in the ED. Will encourage the patient to have a recheck with her PCP or endocrinologist within the next week.  X-ray of the right knee is unremarkable for effusion, dislocation, or fracture.  Doubt septic joint or gout at this time.  Will place the patient in a knee sleeve, crutches, and discharged her to home with a referral to orthopedics and RICE therapy.  Recommended Tylenol, capsaicin and lidocaine gel instead of NSAIDs given her history of DM.  She is hemodynamically stable and in no acute distress at this time.  Strict return precautions given.  The patient is safe for discharge at this time.  Final Clinical Impressions(s) / ED Diagnoses   Final diagnoses:  Acute pain of right knee  Chronic pain of right knee    ED Discharge Orders    None         Joanne Gavel, PA-C 09/16/17 1439    Orlie Dakin, MD 09/16/17 1700

## 2017-09-16 NOTE — ED Triage Notes (Signed)
Pt complaint of leg pain worse to right for year "but my doctor never looked into it."

## 2017-09-16 NOTE — Discharge Instructions (Signed)
Please wear the sleeve to provide compression on the knee to help with your pain.  Use the crutches as needed to take weight off of the right foot.  You can begin to put weight on the right foot as your pain allows.  Apply ice at least 3-4 times a day to your right knee.  Elevate your right leg above the level of your heart when you are sitting and resting.  Please call Dr. Berenice Primas office to schedule a follow-up appointment from your visit today.  Since you have a history of diabetes, you should avoid NSAIDs including ibuprofen and naproxen.  Take thousand milligrams of Tylenol every 8 hours to help with your pain.  You can also purchase a topical cream such as capsaicin or lidocaine, both of which are available over-the-counter, to apply directly to the skin of the right knee.  Use as directed on the label.  Please schedule a follow-up appointment with your primary care provider or endocrinologist to have your blood sugar rechecked within the next week.  It was 288 in the emergency department today.  If you develop abdominal pain, vomiting, confusion, or other concerning please return to the emergency department for re-evaluation.  If you develop new or worsening symptoms, including a fever, chills, or if the right knee becomes hot to the touch, red, or swollen, please return to the emergency department for reevaluation.

## 2017-09-23 ENCOUNTER — Other Ambulatory Visit: Payer: Self-pay | Admitting: *Deleted

## 2017-09-23 DIAGNOSIS — B20 Human immunodeficiency virus [HIV] disease: Secondary | ICD-10-CM

## 2017-09-25 ENCOUNTER — Other Ambulatory Visit: Payer: Self-pay | Admitting: Internal Medicine

## 2017-09-28 ENCOUNTER — Other Ambulatory Visit: Payer: Medicaid Other

## 2017-10-12 ENCOUNTER — Ambulatory Visit: Payer: Medicaid Other | Admitting: Internal Medicine

## 2017-10-18 ENCOUNTER — Other Ambulatory Visit: Payer: Medicaid Other

## 2017-10-27 ENCOUNTER — Other Ambulatory Visit: Payer: Medicaid Other

## 2017-10-27 DIAGNOSIS — B20 Human immunodeficiency virus [HIV] disease: Secondary | ICD-10-CM

## 2017-10-28 LAB — COMPLETE METABOLIC PANEL WITH GFR
AG RATIO: 1.2 (calc) (ref 1.0–2.5)
ALBUMIN MSPROF: 3.6 g/dL (ref 3.6–5.1)
ALT: 21 U/L (ref 6–29)
AST: 24 U/L (ref 10–30)
Alkaline phosphatase (APISO): 150 U/L — ABNORMAL HIGH (ref 33–115)
BUN: 8 mg/dL (ref 7–25)
CO2: 23 mmol/L (ref 20–32)
Calcium: 9 mg/dL (ref 8.6–10.2)
Chloride: 104 mmol/L (ref 98–110)
Creat: 0.67 mg/dL (ref 0.50–1.10)
GFR, EST AFRICAN AMERICAN: 128 mL/min/{1.73_m2} (ref >=60)
GFR, EST NON AFRICAN AMERICAN: 111 mL/min/{1.73_m2} (ref >=60)
GLOBULIN: 3 g/dL (ref 1.9–3.7)
GLUCOSE: 360 mg/dL — AB (ref 65–99)
POTASSIUM: 4.2 mmol/L (ref 3.5–5.3)
Sodium: 135 mmol/L (ref 135–146)
Total Bilirubin: 0.3 mg/dL (ref 0.2–1.2)
Total Protein: 6.6 g/dL (ref 6.1–8.1)

## 2017-10-28 LAB — CBC WITH DIFFERENTIAL/PLATELET
BASOS ABS: 22 {cells}/uL (ref 0–200)
Basophils Relative: 0.6 %
Eosinophils Absolute: 29 cells/uL (ref 15–500)
Eosinophils Relative: 0.8 %
HCT: 34.4 % — ABNORMAL LOW (ref 35.0–45.0)
Hemoglobin: 10.9 g/dL — ABNORMAL LOW (ref 11.7–15.5)
Lymphs Abs: 1451 cells/uL (ref 850–3900)
MCH: 24.3 pg — AB (ref 27.0–33.0)
MCHC: 31.7 g/dL — AB (ref 32.0–36.0)
MCV: 76.6 fL — AB (ref 80.0–100.0)
MPV: 11.3 fL (ref 7.5–12.5)
Monocytes Relative: 7.3 %
NEUTROS PCT: 51 %
Neutro Abs: 1836 cells/uL (ref 1500–7800)
PLATELETS: 179 10*3/uL (ref 140–400)
RBC: 4.49 10*6/uL (ref 3.80–5.10)
RDW: 13.6 % (ref 11.0–15.0)
TOTAL LYMPHOCYTE: 40.3 %
WBC mixed population: 263 cells/uL (ref 200–950)
WBC: 3.6 10*3/uL — AB (ref 3.8–10.8)

## 2017-10-28 LAB — T-HELPER CELL (CD4) - (RCID CLINIC ONLY)
CD4 % Helper T Cell: 37 % (ref 33–55)
CD4 T Cell Abs: 530 /uL (ref 400–2700)

## 2017-11-02 ENCOUNTER — Encounter: Payer: Medicaid Other | Admitting: Internal Medicine

## 2017-11-02 LAB — HIV-1 RNA QUANT-NO REFLEX-BLD
HIV 1 RNA Quant: 41 copies/mL — ABNORMAL HIGH
HIV-1 RNA Quant, Log: 1.61 Log copies/mL — ABNORMAL HIGH

## 2017-11-10 ENCOUNTER — Encounter: Payer: Self-pay | Admitting: Internal Medicine

## 2017-11-10 ENCOUNTER — Other Ambulatory Visit: Payer: Self-pay | Admitting: Internal Medicine

## 2017-11-10 ENCOUNTER — Other Ambulatory Visit: Payer: Self-pay

## 2017-11-10 ENCOUNTER — Ambulatory Visit (INDEPENDENT_AMBULATORY_CARE_PROVIDER_SITE_OTHER): Payer: Medicaid Other | Admitting: Internal Medicine

## 2017-11-10 VITALS — BP 115/79 | HR 88 | Temp 97.9°F | Ht 67.0 in | Wt 267.0 lb

## 2017-11-10 DIAGNOSIS — Z79899 Other long term (current) drug therapy: Secondary | ICD-10-CM | POA: Diagnosis not present

## 2017-11-10 DIAGNOSIS — B20 Human immunodeficiency virus [HIV] disease: Secondary | ICD-10-CM | POA: Diagnosis not present

## 2017-11-10 DIAGNOSIS — Z5181 Encounter for therapeutic drug level monitoring: Secondary | ICD-10-CM

## 2017-11-10 DIAGNOSIS — N63 Unspecified lump in unspecified breast: Secondary | ICD-10-CM

## 2017-11-10 DIAGNOSIS — Z0000 Encounter for general adult medical examination without abnormal findings: Secondary | ICD-10-CM | POA: Diagnosis not present

## 2017-11-10 DIAGNOSIS — Z113 Encounter for screening for infections with a predominantly sexual mode of transmission: Secondary | ICD-10-CM

## 2017-11-10 DIAGNOSIS — E1165 Type 2 diabetes mellitus with hyperglycemia: Secondary | ICD-10-CM | POA: Diagnosis not present

## 2017-11-10 NOTE — Progress Notes (Signed)
   Subjective:    Patient ID: Beverly Johnson, female    DOB: 01/16/1976, 42 y.o.   MRN: [id number]  HPI Here for follow up of HIV Has been on Genvoya and denies any missed doses.  No associated n/d.  No rashes.  CD4 530, viral load 41 copies.  No new issues.  Had an evaluation for a breast nodule and mammogram ok, requests reevaluation in 6 months and is scheduled for July.     Review of Systems  Constitutional: Negative for fatigue.  Gastrointestinal: Negative for diarrhea and nausea.  Skin: Negative for rash.       Objective:   Physical Exam  Constitutional: She appears well-developed and well-nourished. No distress.  HENT:  Mouth/Throat: No oropharyngeal exudate.  Eyes: No scleral icterus.  Cardiovascular: Normal rate, regular rhythm and normal heart sounds.  No murmur heard. Pulmonary/Chest: Effort normal and breath sounds normal. No respiratory distress.  Skin: No rash noted.   SH: no tobacco       Assessment & Plan:

## 2017-11-12 DIAGNOSIS — Z5181 Encounter for therapeutic drug level monitoring: Secondary | ICD-10-CM | POA: Insufficient documentation

## 2017-11-12 NOTE — Assessment & Plan Note (Signed)
No issues, good control. Some detectable virus but compliance good so no further evaluation indicated.  rtc 6 months

## 2017-11-12 NOTE — Assessment & Plan Note (Signed)
Creat good, wnl.

## 2017-11-12 NOTE — Assessment & Plan Note (Signed)
Discussed results and mammogram.  Follow up scheduled.

## 2018-01-11 ENCOUNTER — Other Ambulatory Visit: Payer: Self-pay | Admitting: Internal Medicine

## 2018-02-14 ENCOUNTER — Encounter (HOSPITAL_COMMUNITY): Payer: Self-pay

## 2018-02-14 ENCOUNTER — Emergency Department (HOSPITAL_COMMUNITY)
Admission: EM | Admit: 2018-02-14 | Discharge: 2018-02-14 | Disposition: A | Discharge status: Home / Self Care | Payer: Medicaid Other | Attending: Emergency Medicine | Admitting: Emergency Medicine

## 2018-02-14 ENCOUNTER — Other Ambulatory Visit: Payer: Self-pay

## 2018-02-14 DIAGNOSIS — B373 Candidiasis of vulva and vagina: Secondary | ICD-10-CM | POA: Diagnosis not present

## 2018-02-14 DIAGNOSIS — Z7984 Long term (current) use of oral hypoglycemic drugs: Secondary | ICD-10-CM | POA: Diagnosis not present

## 2018-02-14 DIAGNOSIS — E119 Type 2 diabetes mellitus without complications: Secondary | ICD-10-CM | POA: Diagnosis not present

## 2018-02-14 DIAGNOSIS — R102 Pelvic and perineal pain: Secondary | ICD-10-CM | POA: Diagnosis not present

## 2018-02-14 DIAGNOSIS — Z79899 Other long term (current) drug therapy: Secondary | ICD-10-CM | POA: Diagnosis not present

## 2018-02-14 DIAGNOSIS — N898 Other specified noninflammatory disorders of vagina: Secondary | ICD-10-CM | POA: Diagnosis present

## 2018-02-14 DIAGNOSIS — Z21 Asymptomatic human immunodeficiency virus [HIV] infection status: Secondary | ICD-10-CM | POA: Diagnosis not present

## 2018-02-14 DIAGNOSIS — B3731 Acute candidiasis of vulva and vagina: Secondary | ICD-10-CM

## 2018-02-14 LAB — URINALYSIS, ROUTINE W REFLEX MICROSCOPIC
Bilirubin Urine: NEGATIVE
Glucose, UA: >=500 mg/dL — AB
KETONES UR: NEGATIVE mg/dL
Leukocytes, UA: NEGATIVE
NITRITE: NEGATIVE
PH: 5 (ref 5.0–8.0)
Protein, ur: NEGATIVE mg/dL
Specific Gravity, Urine: 1.035 — ABNORMAL HIGH (ref 1.005–1.030)

## 2018-02-14 LAB — WET PREP, GENITAL
CLUE CELLS WET PREP: NONE SEEN
Sperm: NONE SEEN
Trich, Wet Prep: NONE SEEN

## 2018-02-14 LAB — CBG MONITORING, ED: Glucose-Capillary: 404 mg/dL — ABNORMAL HIGH (ref 70–99)

## 2018-02-14 LAB — POC URINE PREG, ED: PREG TEST UR: NEGATIVE

## 2018-02-14 MED ORDER — MICONAZOLE NITRATE 2 % VA CREA: 1.0000 | TOPICAL_CREAM | Freq: Every day | VAGINAL | 0 refills | Status: DC

## 2018-02-14 MED ORDER — FLUCONAZOLE 150 MG PO TABS: ORAL_TABLET | ORAL | 0 refills | Status: DC

## 2018-02-14 NOTE — ED Notes (Signed)
BGL 404

## 2018-02-14 NOTE — Discharge Instructions (Addendum)
He was seen in the emergency department today and found to have a yeast infection.  Please take Diflucan once today, repeat this in 72 hours.  We have also given you a vaginal insert to utilize nightly for the next 7 days.  Please follow-up with your infectious disease doctor (HIV doctor) within 5 days for reevaluation.  Additionally your blood glucose was elevated at 400 today, it is important that you have this rechecked by primary care provider, with possible adjustment in your medications.  Return to the ER for any time for new or worsening symptoms or any other concerns.

## 2018-02-14 NOTE — ED Triage Notes (Signed)
Patient c/o vaginal itching x 8-9 months. Patient states she was given a pill and OTC cream 8 months ago. Patient states the symptoms never got better.

## 2018-02-14 NOTE — ED Provider Notes (Signed)
[facility] DEPT Provider Note   CSN: [id number] Arrival date & time: 02/14/18  1647   History   Chief Complaint Chief Complaint  Patient presents with  . Vaginal Itching  . vaginal rash    HPI Beverly Johnson is a 42 y.o. female with a hx of T2DM and HIV who presents to the ED with complaints of vaginal itching/irritation for 8-9 months.  Patient reports she was seen in the emergency department in October 2018, she was diagnosed with a yeast infection and given prescription for 1 tablet 200 mg Diflucan and topical miconazole cream.  She states that she took these medications without significant change in her symptoms.  She has had persistent symptoms since she was seen in October.  She has not seen alternative healthcare provider for the symptoms.  She states she is having a vaginal irritation/pain, she feels the skin is red and raw. Pain worsens with urination. She does report dysuria. Pain is moderate in severity, otherwise no alleviating/aggravating factors. She has not noted any vaginal discharge that she has noted. No home interventions prior to arrival. Denies fever, chills, nausea, vomiting, vaginal discharge/bleeding, or abdominal pain. She is not currently sexually active and has no concern for STDs. She takes her HIV medication as prescribed.   HPI  Past Medical History:  Diagnosis Date  . Diabetes mellitus without complication ([place])   . Gallstones 03/10/2011  . HIV infection ([place])   . Immune deficiency disorder [facility])     Patient Active Problem List   Diagnosis Date Noted  . Medication monitoring encounter 11/12/2017  . Breast nodule 09/02/2017  . Screening examination for venereal disease 03/30/2017  . Encounter for long-term (current) use of high-risk medication 03/30/2017  . Microalbuminuria due to type 2 diabetes mellitus ([place]) 03/30/2017  . Knee pain, chronic 03/30/2017  . Vitamin D deficiency 03/21/2017  . Bilateral leg cramps  01/22/2017  . Healthcare maintenance 03/02/2014  . Morbid obesity with BMI of 45.0-49.9, adult ([place]) 04/27/2012  . Uncontrolled type 2 diabetes mellitus ([place]) 12/08/2010  . Human immunodeficiency virus (HIV) disease ([place]) 09/29/2006    Past Surgical History:  Procedure Laterality Date  . CHOLECYSTECTOMY  06/29/2011   Procedure: LAPAROSCOPIC CHOLECYSTECTOMY WITH INTRAOPERATIVE CHOLANGIOGRAM;  Surgeon: Merrie Roof, MD;  Location: [place];  Service: General;  Laterality: N/A;  . NO PAST SURGERIES       OB History   None      Home Medications    Prior to Admission medications   Medication Sig Start Date End Date Taking? Authorizing Provider  elvitegravir-cobicistat-emtricitabine-tenofovir (GENVOYA) 150-150-200-10 MG TABS tablet Take 1 tablet by mouth daily with breakfast. 03/30/17  Yes Comer, Okey Regal, MD  metFORMIN (GLUCOPHAGE) 500 MG tablet Take 2 tablets (1,000 mg total) by mouth 2 (two) times daily with a meal. 03/22/17 05/19/18 Yes Ledell Noss, MD  sitaGLIPtin (JANUVIA) 100 MG tablet Take 1 tablet (100 mg total) by mouth daily. 08/05/17  Yes Neva Seat, MD  glucose blood (ACCU-CHEK SMARTVIEW) test strip Check blood sugar one time a day Patient not taking: Reported on 11/10/2017 03/14/17   Ledell Noss, MD    Family History History reviewed. No pertinent family history.  Social History Social History   Tobacco Use  . Smoking status: Never Smoker  . Smokeless tobacco: Never Used  Substance Use Topics  . Alcohol use: No  . Drug use: No     Allergies   Patient has no known allergies.   Review  of Systems Review of Systems  Constitutional: Negative for chills and fever.  Respiratory: Negative for shortness of breath.   Cardiovascular: Negative for leg swelling.  Gastrointestinal: Negative for abdominal pain, blood in stool, constipation, diarrhea, nausea and vomiting.  Genitourinary: Positive for vaginal pain. Negative for vaginal bleeding and vaginal discharge.    Skin: Positive for rash (Vaginal skin irritation).  All other systems reviewed and are negative.    Physical Exam Updated Vital Signs BP 125/78 (BP Location: Left Arm)   Pulse (!) 104   Temp 98.5 F (36.9 C) (Oral)   Resp 18   Ht 5\' 7"  (1.702 m)   Wt 117.9 kg (260 lb)   LMP 02/05/2018 (Approximate)   SpO2 100%   BMI 40.72 kg/m   Physical Exam  Constitutional: She appears well-developed and well-nourished. No distress.  HENT:  Head: Normocephalic and atraumatic.  Eyes: Conjunctivae are normal. Right eye exhibits no discharge. Left eye exhibits no discharge.  Cardiovascular: Normal rate and regular rhythm.  No murmur heard. Pulmonary/Chest: Effort normal and breath sounds normal. No respiratory distress.  Abdominal: Soft. She exhibits no distension. There is no tenderness. There is no rebound and no guarding.  Genitourinary:    Pelvic exam was performed with patient supine. Cervix exhibits discharge (Moderate amount of thick white discharge). Cervix exhibits no motion tenderness. Right adnexum displays no mass, no tenderness and no fullness. Left adnexum displays no mass, no tenderness and no fullness. No tenderness or bleeding in the vagina. No foreign body in the vagina. Vaginal discharge (Moderate amount of thick white discharge) found.  Genitourinary Comments: EDT and RN present as chaperones  Neurological: She is alert.  Clear speech.   Psychiatric: She has a normal mood and affect. Her behavior is normal. Thought content normal.  Nursing note and vitals reviewed.  ED Treatments / Results  Labs Results for orders placed or performed during the hospital encounter of 02/14/18  Wet prep, genital  Result Value Ref Range   Yeast Wet Prep HPF POC PRESENT (A) NONE SEEN   Trich, Wet Prep NONE SEEN NONE SEEN   Clue Cells Wet Prep HPF POC NONE SEEN NONE SEEN   WBC, Wet Prep HPF POC FEW (A) NONE SEEN   Sperm NONE SEEN   Urinalysis, Routine w reflex microscopic  Result Value  Ref Range   Color, Urine YELLOW YELLOW   APPearance CLOUDY (A) CLEAR   Specific Gravity, Urine 1.035 (H) 1.005 - 1.030   pH 5.0 5.0 - 8.0   Glucose, UA >=500 (A) NEGATIVE mg/dL   Hgb urine dipstick SMALL (A) NEGATIVE   Bilirubin Urine NEGATIVE NEGATIVE   Ketones, ur NEGATIVE NEGATIVE mg/dL   Protein, ur NEGATIVE NEGATIVE mg/dL   Nitrite NEGATIVE NEGATIVE   Leukocytes, UA NEGATIVE NEGATIVE   RBC / HPF 11-20 0 - 5 RBC/hpf   WBC, UA 11-20 0 - 5 WBC/hpf   Bacteria, UA RARE (A) NONE SEEN   Squamous Epithelial / LPF 11-20 0 - 5   Mucus PRESENT    Budding Yeast PRESENT   POC urine preg, ED  Result Value Ref Range   Preg Test, Ur NEGATIVE NEGATIVE  POC CBG, ED  Result Value Ref Range   Glucose-Capillary 404 (H) 70 - 99 mg/dL   No results found.  EKG None  Radiology No results found.  Procedures Procedures (including critical care time)  Medications Ordered in ED Medications - No data to display   Initial Impression / Assessment and Plan /  ED Course  I have reviewed the triage vital signs and the nursing notes.  Pertinent labs & imaging results that were available during my care of the patient were reviewed by me and considered in my medical decision making (see chart for details).   Patient presents to the emergency department with signs and symptoms consistent with yeast vaginitis, confirmed with wet prep. Nontoxic appearing, no apparent distress, initial tachycardia resolved on my exam and with repeat vitals, afebrile. UA does not appear consistent with obvious UTI. CBG is elevated, patient aware of need for PCP recheck and that uncontrolled diabetes could be contributory to current condition, no ketonuria. No findings of BV. Patient not sexually active, low suspicion for GC/chlamydia/syphilis, results pending. Yeast infection does not appear to have superimposed bacterial infection, does not appear to be disseminated. Will treat with diflucan and topical miconazole. I  discussed results, treatment plan, need for follow-up with obgyn or infectious disease specialist she sees for HIV, and return precautions with the patient. Provided opportunity for questions, patient confirmed understanding and is in agreement with plan.   Findings and plan of care discussed with supervising physician Dr. Vanita Panda who is in agreement with plan.   Vitals:   02/14/18 1655 02/14/18 2000  BP: 125/78 134/86  Pulse: (!) 104 78  Resp: 18 16  Temp: 98.5 F (36.9 C) 98.1 F (36.7 C)  SpO2: 100% 100%    Final Clinical Impressions(s) / ED Diagnoses   Final diagnoses:  Yeast vaginitis    ED Discharge Orders    None       Amaryllis Dyke, PA-C 02/14/18 2327    Carmin Muskrat, MD 02/14/18 [phone]

## 2018-02-14 NOTE — ED Notes (Signed)
Patient verbalized understanding of discharge instructions, no questions. Patient ambulated out of ED with steady gait in no distress.  

## 2018-02-15 LAB — RPR: RPR Ser Ql: NONREACTIVE

## 2018-02-15 LAB — GC/CHLAMYDIA PROBE AMP (~~LOC~~) NOT AT ARMC
Chlamydia: NEGATIVE
Neisseria Gonorrhea: NEGATIVE

## 2018-02-16 LAB — URINE CULTURE

## 2018-03-08 ENCOUNTER — Inpatient Hospital Stay: Admission: RE | Admit: 2018-03-08 | Payer: Medicaid Other | Source: Ambulatory Visit

## 2018-04-21 ENCOUNTER — Emergency Department (HOSPITAL_COMMUNITY): Payer: Medicaid Other

## 2018-04-21 ENCOUNTER — Encounter (HOSPITAL_COMMUNITY): Payer: Self-pay | Admitting: *Deleted

## 2018-04-21 ENCOUNTER — Emergency Department (HOSPITAL_COMMUNITY)
Admission: EM | Admit: 2018-04-21 | Discharge: 2018-04-21 | Disposition: A | Discharge status: Home / Self Care | Payer: Medicaid Other | Attending: Emergency Medicine | Admitting: Emergency Medicine

## 2018-04-21 DIAGNOSIS — B20 Human immunodeficiency virus [HIV] disease: Secondary | ICD-10-CM | POA: Diagnosis not present

## 2018-04-21 DIAGNOSIS — Z7984 Long term (current) use of oral hypoglycemic drugs: Secondary | ICD-10-CM | POA: Diagnosis not present

## 2018-04-21 DIAGNOSIS — E119 Type 2 diabetes mellitus without complications: Secondary | ICD-10-CM | POA: Diagnosis not present

## 2018-04-21 DIAGNOSIS — M25561 Pain in right knee: Secondary | ICD-10-CM | POA: Insufficient documentation

## 2018-04-21 DIAGNOSIS — Z79899 Other long term (current) drug therapy: Secondary | ICD-10-CM | POA: Diagnosis not present

## 2018-04-21 MED ORDER — OXYCODONE-ACETAMINOPHEN 5-325 MG PO TABS
1.0000 | ORAL_TABLET | Freq: Once | ORAL | Status: AC
  Administered 2018-04-21: 1 via ORAL
  Filled 2018-04-21: qty 1

## 2018-04-21 NOTE — ED Provider Notes (Addendum)
[facility] DEPT Provider Note   CSN: [id number] Arrival date & time: 04/21/18  1218     History   Chief Complaint Chief Complaint  Patient presents with  . Knee Pain    bilateral    HPI Beverly Johnson is a 42 y.o. female with hx of HIV, leg cramps and DM who presents to the ED forl knee pain. The pain started this morning. 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 She just started the job a couple days ago. Patient reports she has taken nothing for pain. The pain is located in the right knee. Patient reports mild pain in the left knee.  HPI  Past Medical History:  Diagnosis Date  . Diabetes mellitus without complication ([place])   . Gallstones 03/10/2011  . HIV infection ([place])   . Immune deficiency disorder [facility])     Patient Active Problem List   Diagnosis Date Noted  . Medication monitoring encounter 11/12/2017  . Breast nodule 09/02/2017  . Screening examination for venereal disease 03/30/2017  . Encounter for long-term (current) use of high-risk medication 03/30/2017  . Microalbuminuria due to type 2 diabetes mellitus ([place]) 03/30/2017  . Knee pain, chronic 03/30/2017  . Vitamin D deficiency 03/21/2017  . Bilateral leg cramps 01/22/2017  . Healthcare maintenance 03/02/2014  . Morbid obesity with BMI of 45.0-49.9, adult ([place]) 04/27/2012  . Uncontrolled type 2 diabetes mellitus ([place]) 12/08/2010  . Human immunodeficiency virus (HIV) disease ([place]) 09/29/2006    Past Surgical History:  Procedure Laterality Date  . CHOLECYSTECTOMY  06/29/2011   Procedure: LAPAROSCOPIC CHOLECYSTECTOMY WITH INTRAOPERATIVE CHOLANGIOGRAM;  Surgeon: Merrie Roof, MD;  Location: [place];  Service: General;  Laterality: N/A;  . NO PAST SURGERIES       OB History   None      Home Medications    Prior to Admission medications   Medication Sig Start Date End Date Taking? Authorizing Provider    elvitegravir-cobicistat-emtricitabine-tenofovir (GENVOYA) 150-150-200-10 MG TABS tablet Take 1 tablet by mouth daily with breakfast. 03/30/17   Comer, Okey Regal, MD  fluconazole (DIFLUCAN) 150 MG tablet Take 1 tablet today, if your symptoms have not resolved repeat this in 72 hours. 02/14/18   Petrucelli, Glynda Jaeger, PA-C  metFORMIN (GLUCOPHAGE) 500 MG tablet Take 2 tablets (1,000 mg total) by mouth 2 (two) times daily with a meal. 03/22/17 05/19/18  Ledell Noss, MD  miconazole (MONISTAT 7) 2 % vaginal cream Place 1 Applicatorful vaginally at bedtime. 02/14/18   Petrucelli, Samantha R, PA-C  sitaGLIPtin (JANUVIA) 100 MG tablet Take 1 tablet (100 mg total) by mouth daily. 08/05/17   Neva Seat, MD    Family History No family history on file.  Social History Social History   Tobacco Use  . Smoking status: Never Smoker  . Smokeless tobacco: Never Used  Substance Use Topics  . Alcohol use: No  . Drug use: No     Allergies   Patient has no known allergies.   Review of Systems Review of Systems  Musculoskeletal: Positive for arthralgias.  All other systems reviewed and are negative.    Physical Exam Updated Vital Signs BP 122/86 (BP Location: Left Arm)   Pulse 78   Temp 98.7 F (37.1 C) (Oral)   Resp 14   LMP 04/08/2018   SpO2 100%   Physical Exam  Constitutional: She appears well-developed and well-nourished. No distress.  HENT:  Head: Normocephalic.  Eyes: EOM  are normal.  Neck: Neck supple.  Cardiovascular: Normal rate and intact distal pulses.  Pulmonary/Chest: Effort normal.  Abdominal: Soft. There is no tenderness.  Musculoskeletal:       Right knee: She exhibits no deformity, no laceration, no erythema and normal alignment. Decreased range of motion: due to pain. Swelling: mild. Tenderness found.  Pain with passive range of motion of the right knee.  Neurological: She is alert.  Skin: Skin is warm and dry.  Psychiatric: She has a normal mood and affect.   Nursing note and vitals reviewed.    ED Treatments / Results  Labs (all labs ordered are listed, but only abnormal results are displayed) Labs Reviewed - No data to display  Radiology Dg Knee Complete 4 Views Right  Result Date: 04/21/2018 CLINICAL DATA:  Right knee pain with pain and swelling EXAM: RIGHT KNEE - COMPLETE 4+ VIEW COMPARISON:  09/16/2017 FINDINGS: No acute fracture or malalignment. Mild medial and patellofemoral joint space degenerative change. No significant knee effusion. IMPRESSION: Mild degenerative changes.  No acute osseous abnormality. Electronically Signed   By: Donavan Foil M.D.   On: 04/21/2018 13:58    Procedures Procedures (including critical care time)  Medications Ordered in ED Medications  oxyCODONE-acetaminophen (PERCOCET/ROXICET) 5-325 MG per tablet 1 tablet (1 tablet Oral Given 04/21/18 1353)     Initial Impression / Assessment and Plan / ED Course  I have reviewed the triage vital signs and the nursing notes. 42 y.o. female here with knee pain stable for d/c without fracture or dislocation noted on x-ray. Patient reports decreased pain after knee immobilizer applied. Patient to f/u with PCP. Return precautions discussed..  Final Clinical Impressions(s) / ED Diagnoses   Final diagnoses:  Acute pain of right knee    ED Discharge Orders    None       Debroah Baller [place], NP 04/21/18 [place], [place] 04/21/18 2152    Virgel Manifold, MD 04/22/18 1019

## 2018-04-21 NOTE — Discharge Instructions (Addendum)
Rest the area and take tylenol for pain. Follow up with your doctor in the next few days for recheck. Return here for worsening symptoms.

## 2018-04-21 NOTE — ED Triage Notes (Signed)
Pt complains of bilateral knee pain since waking up today. Pt denies injury. Pain is constant.

## 2018-04-28 ENCOUNTER — Other Ambulatory Visit: Payer: Medicaid Other

## 2018-04-29 ENCOUNTER — Ambulatory Visit (HOSPITAL_COMMUNITY)
Admission: EM | Admit: 2018-04-29 | Discharge: 2018-04-29 | Disposition: A | Discharge status: Home / Self Care | Payer: Medicaid Other | Attending: Emergency Medicine | Admitting: Emergency Medicine

## 2018-04-29 ENCOUNTER — Other Ambulatory Visit: Payer: Self-pay | Admitting: Internal Medicine

## 2018-04-29 ENCOUNTER — Telehealth: Payer: Self-pay | Admitting: Behavioral Health

## 2018-04-29 ENCOUNTER — Telehealth (HOSPITAL_COMMUNITY): Payer: Self-pay

## 2018-04-29 ENCOUNTER — Encounter (HOSPITAL_COMMUNITY): Payer: Self-pay | Admitting: Emergency Medicine

## 2018-04-29 DIAGNOSIS — E1165 Type 2 diabetes mellitus with hyperglycemia: Secondary | ICD-10-CM | POA: Diagnosis not present

## 2018-04-29 DIAGNOSIS — Z7984 Long term (current) use of oral hypoglycemic drugs: Secondary | ICD-10-CM | POA: Diagnosis not present

## 2018-04-29 DIAGNOSIS — E876 Hypokalemia: Secondary | ICD-10-CM | POA: Insufficient documentation

## 2018-04-29 DIAGNOSIS — R739 Hyperglycemia, unspecified: Secondary | ICD-10-CM | POA: Diagnosis not present

## 2018-04-29 DIAGNOSIS — D649 Anemia, unspecified: Secondary | ICD-10-CM | POA: Diagnosis not present

## 2018-04-29 DIAGNOSIS — E119 Type 2 diabetes mellitus without complications: Secondary | ICD-10-CM

## 2018-04-29 LAB — POCT I-STAT, CHEM 8
BUN: 4 mg/dL — AB (ref 6–20)
Calcium, Ion: 1.21 mmol/L (ref 1.15–1.40)
Chloride: 103 mmol/L (ref 98–111)
Creatinine, Ser: 0.7 mg/dL (ref 0.44–1.00)
GLUCOSE: 271 mg/dL — AB (ref 70–99)
HEMATOCRIT: 35 % — AB (ref 36.0–46.0)
HEMOGLOBIN: 11.9 g/dL — AB (ref 12.0–15.0)
Potassium: 3.4 mmol/L — ABNORMAL LOW (ref 3.5–5.1)
Sodium: 139 mmol/L (ref 135–145)
TCO2: 25 mmol/L (ref 22–32)

## 2018-04-29 LAB — CBC
HCT: 35.3 % — ABNORMAL LOW (ref 36.0–46.0)
HEMOGLOBIN: 10.7 g/dL — AB (ref 12.0–15.0)
MCH: 23.9 pg — ABNORMAL LOW (ref 26.0–34.0)
MCHC: 30.3 g/dL (ref 30.0–36.0)
MCV: 79 fL (ref 78.0–100.0)
Platelets: 177 10*3/uL (ref 150–400)
RBC: 4.47 MIL/uL (ref 3.87–5.11)
RDW: 13.2 % (ref 11.5–15.5)
WBC: 3.9 10*3/uL — ABNORMAL LOW (ref 4.0–10.5)

## 2018-04-29 MED ORDER — GLUCOSE BLOOD VI STRP: ORAL_STRIP | 12 refills | Status: DC

## 2018-04-29 NOTE — Telephone Encounter (Signed)
Patient called in today stating she has run out of test strips for her Glucometer.  Patient has been seen previously at [facility] in the past but was told she has missed too many appointments.  Patient now is requesting a refill for her test strips.  She states she has not taken her blood sugar in a few weeks but now wants to.  Explained that her Primary care doctor is to manage her Diabetes but she states right now Dr. Linus Salmons is the only doctor she has.  Advised her to go to Urgent care so they can check her blood sugar and assist her further. Pricilla Riffle RN

## 2018-04-29 NOTE — ED Triage Notes (Signed)
Pt here for not feeling well; unsure if sugar could be elevated

## 2018-04-29 NOTE — ED Provider Notes (Signed)
[facility] [place] [facility]    CSN: [id number] Arrival date & time: 04/29/18  1247     History   Chief Complaint Chief Complaint  Patient presents with  . Hyperglycemia    HPI Beverly Johnson is a 42 y.o. female.   The history is provided by the patient.  Hyperglycemia  Blood sugar level PTA:  Unknown: out of test strips Severity:  Unable to specify Onset quality:  Gradual Timing:  Constant Progression:  Worsening Chronicity:  Recurrent Diabetes status:  Controlled with oral medications Current diabetic therapy:  Metformin and januvia Time since last antidiabetic medication:  0 days Context: not change in medication   Context comment:  She is out of test strips and has been discharged from her primary care practice due to multiple missed appointments.  Relieved by:  Nothing Ineffective treatments:  None tried Associated symptoms: diaphoresis, increased thirst and polyuria   Associated symptoms: no abdominal pain, no altered mental status, no blurred vision, no chest pain, no dehydration, no dysuria, no fever, no shortness of breath and no vomiting     Past Medical History:  Diagnosis Date  . Diabetes mellitus without complication ([place])   . Gallstones 03/10/2011  . HIV infection ([place])   . Immune deficiency disorder [facility])     Patient Active Problem List   Diagnosis Date Noted  . Medication monitoring encounter 11/12/2017  . Breast nodule 09/02/2017  . Screening examination for venereal disease 03/30/2017  . Encounter for long-term (current) use of high-risk medication 03/30/2017  . Microalbuminuria due to type 2 diabetes mellitus ([place]) 03/30/2017  . Knee pain, chronic 03/30/2017  . Vitamin D deficiency 03/21/2017  . Bilateral leg cramps 01/22/2017  . Healthcare maintenance 03/02/2014  . Morbid obesity with BMI of 45.0-49.9, adult ([place]) 04/27/2012  . Uncontrolled type 2 diabetes mellitus ([place]) 12/08/2010  . Human immunodeficiency virus (HIV) disease ([place]) 09/29/2006      Past Surgical History:  Procedure Laterality Date  . CHOLECYSTECTOMY  06/29/2011   Procedure: LAPAROSCOPIC CHOLECYSTECTOMY WITH INTRAOPERATIVE CHOLANGIOGRAM;  Surgeon: Merrie Roof, MD;  Location: [place];  Service: General;  Laterality: N/A;  . NO PAST SURGERIES      OB History   None      Home Medications    Prior to Admission medications   Medication Sig Start Date End Date Taking? Authorizing Provider  elvitegravir-cobicistat-emtricitabine-tenofovir (GENVOYA) 150-150-200-10 MG TABS tablet Take 1 tablet by mouth daily with breakfast. 03/30/17   Comer, Okey Regal, MD  fluconazole (DIFLUCAN) 150 MG tablet Take 1 tablet today, if your symptoms have not resolved repeat this in 72 hours. 02/14/18   Petrucelli, Glynda Jaeger, PA-C  metFORMIN (GLUCOPHAGE) 500 MG tablet Take 2 tablets (1,000 mg total) by mouth 2 (two) times daily with a meal. 03/22/17 05/19/18  Ledell Noss, MD  miconazole (MONISTAT 7) 2 % vaginal cream Place 1 Applicatorful vaginally at bedtime. 02/14/18   Petrucelli, Samantha R, PA-C  sitaGLIPtin (JANUVIA) 100 MG tablet Take 1 tablet (100 mg total) by mouth daily. 08/05/17   Neva Seat, MD    Family History History reviewed. No pertinent family history.  Social History Social History   Tobacco Use  . Smoking status: Never Smoker  . Smokeless tobacco: Never Used  Substance Use Topics  . Alcohol use: No  . Drug use: No     Allergies   Patient has no known allergies.   Review of Systems Review of Systems  Constitutional: Positive for diaphoresis. Negative for chills and  fever.  HENT: Negative for ear pain and sore throat.   Eyes: Negative for blurred vision, pain and visual disturbance.  Respiratory: Negative for cough and shortness of breath.   Cardiovascular: Negative for chest pain and palpitations.  Gastrointestinal: Negative for abdominal pain and vomiting.  Endocrine: Positive for polydipsia and polyuria.  Genitourinary: Negative for dysuria and  hematuria.  Musculoskeletal: Negative for arthralgias and back pain.  Skin: Negative for color change and rash.  Neurological: Negative for seizures and syncope.  All other systems reviewed and are negative.    Physical Exam Triage Vital Signs ED Triage Vitals [04/29/18 1313]  Enc Vitals Group     BP 127/75     Pulse Rate 96     Resp 18     Temp 98.4 F (36.9 C)     Temp Source Oral     SpO2 98 %     Weight      Height      Head Circumference      Peak Flow      Pain Score 8     Pain Loc      Pain Edu?      Excl. in [place]?    No data found.  Updated Vital Signs BP 127/75 (BP Location: Left Arm)   Pulse 96   Temp 98.4 F (36.9 C) (Oral)   Resp 18   LMP 04/08/2018   SpO2 98%   Visual Acuity Right Eye Distance:   Left Eye Distance:   Bilateral Distance:    Right Eye Near:   Left Eye Near:    Bilateral Near:     Physical Exam  Constitutional: She appears well-developed and well-nourished. No distress.  HENT:  Head: Normocephalic and atraumatic.  Eyes: Conjunctivae are normal.  Neck: Neck supple.  Cardiovascular: Normal rate and regular rhythm.  No murmur heard. Pulmonary/Chest: Effort normal and breath sounds normal. No respiratory distress.  Abdominal: Soft. There is no tenderness.  Musculoskeletal: She exhibits no edema.  Neurological: She is alert.  Skin: Skin is warm and dry.  Psychiatric: She has a normal mood and affect.  Nursing note and vitals reviewed.    UC Treatments / Results  Labs (all labs ordered are listed, but only abnormal results are displayed) Labs Reviewed  CBC - Abnormal; Notable for the following components:      Result Value   WBC 3.9 (*)    Hemoglobin 10.7 (*)    HCT 35.3 (*)    MCH 23.9 (*)    All other components within normal limits  POCT I-STAT, CHEM 8 - Abnormal; Notable for the following components:   Potassium 3.4 (*)    BUN 4 (*)    Glucose, Bld 271 (*)    Hemoglobin 11.9 (*)    HCT 35.0 (*)    All other  components within normal limits    EKG None  Radiology No results found.  Procedures Procedures (including critical care time)  Medications Ordered in UC Medications - No data to display  Initial Impression / Assessment and Plan / UC Course  I have reviewed the triage vital signs and the nursing notes.  Pertinent labs & imaging results that were available during my care of the patient were reviewed by me and considered in my medical decision making (see chart for details).  Clinical Course as of Apr 30 1435  Fri Apr 29, 2018  1434 Hyperglycemia of 271 consistent with history of somewhat poorly controlled blood glucose values.  Hemoglobin is consistent with prior value performed in March.   Glucose(!): 271 [AM]    Clinical Course User Index [AM] Katy Fitch, MD    The patient is hyperglycemia but roughly at her baseline. She was given test strips, and I counseled her that she needs to establish care with a primary care physician for further medical management of her diabetes. Final Clinical Impressions(s) / UC Diagnoses   Final diagnoses:  Hyperglycemia  Hypokalemia  Anemia, unspecified type   Discharge Instructions   None    ED Prescriptions    Medication Sig Dispense Auth. Provider   glucose blood (ACCU-CHEK ACTIVE STRIPS) test strip Use as instructed 100 each Delaney Meigs, Mancel Bale, MD     Controlled Substance Prescriptions  Controlled Substance Registry consulted? No   Katy Fitch, MD 04/29/18 1452

## 2018-05-02 NOTE — Telephone Encounter (Signed)
Ok top refill

## 2018-05-03 ENCOUNTER — Telehealth (HOSPITAL_COMMUNITY): Payer: Self-pay

## 2018-05-03 MED ORDER — GLUCOSE BLOOD VI STRP: ORAL_STRIP | 12 refills | Status: DC

## 2018-05-12 ENCOUNTER — Encounter: Payer: Medicaid Other | Admitting: Internal Medicine

## 2018-08-11 ENCOUNTER — Other Ambulatory Visit: Payer: Self-pay | Admitting: Internal Medicine

## 2018-09-02 ENCOUNTER — Other Ambulatory Visit: Payer: Self-pay | Admitting: Internal Medicine

## 2018-09-07 ENCOUNTER — Emergency Department (HOSPITAL_COMMUNITY)
Admission: EM | Admit: 2018-09-07 | Discharge: 2018-09-08 | Disposition: A | Discharge status: Home / Self Care | Payer: Medicaid Other | Attending: Emergency Medicine | Admitting: Emergency Medicine

## 2018-09-07 DIAGNOSIS — N3000 Acute cystitis without hematuria: Secondary | ICD-10-CM | POA: Insufficient documentation

## 2018-09-07 DIAGNOSIS — Z79899 Other long term (current) drug therapy: Secondary | ICD-10-CM | POA: Insufficient documentation

## 2018-09-07 DIAGNOSIS — E119 Type 2 diabetes mellitus without complications: Secondary | ICD-10-CM | POA: Diagnosis not present

## 2018-09-07 DIAGNOSIS — R350 Frequency of micturition: Secondary | ICD-10-CM | POA: Diagnosis present

## 2018-09-08 ENCOUNTER — Encounter (HOSPITAL_COMMUNITY): Payer: Self-pay | Admitting: *Deleted

## 2018-09-08 ENCOUNTER — Other Ambulatory Visit: Payer: Self-pay

## 2018-09-08 LAB — URINALYSIS, ROUTINE W REFLEX MICROSCOPIC
Bilirubin Urine: NEGATIVE
Glucose, UA: >=500 mg/dL — AB
Ketones, ur: NEGATIVE mg/dL
NITRITE: NEGATIVE
PH: 5 (ref 5.0–8.0)
Protein, ur: NEGATIVE mg/dL
RBC / HPF: >50 RBC/hpf — ABNORMAL HIGH (ref 0–5)
SPECIFIC GRAVITY, URINE: 1.03 (ref 1.005–1.030)

## 2018-09-08 LAB — POC URINE PREG, ED: PREG TEST UR: NEGATIVE

## 2018-09-08 LAB — CBG MONITORING, ED: GLUCOSE-CAPILLARY: 265 mg/dL — AB (ref 70–99)

## 2018-09-08 MED ORDER — CEPHALEXIN 250 MG PO CAPS
500.0000 mg | ORAL_CAPSULE | Freq: Once | ORAL | Status: AC
  Administered 2018-09-08: 500 mg via ORAL
  Filled 2018-09-08: qty 2

## 2018-09-08 MED ORDER — CEPHALEXIN 500 MG PO CAPS: 500.0000 mg | ORAL_CAPSULE | Freq: Two times a day (BID) | ORAL | 0 refills | Status: DC

## 2018-09-08 MED ORDER — FLUCONAZOLE 150 MG PO TABS
150.0000 mg | ORAL_TABLET | Freq: Once | ORAL | Status: DC
  Filled 2018-09-08: qty 1

## 2018-09-08 MED ORDER — FLUCONAZOLE 150 MG PO TABS
150.0000 mg | ORAL_TABLET | Freq: Once | ORAL | 0 refills | Status: AC
Start: 2018-09-08 — End: 2018-09-08

## 2018-09-08 NOTE — Discharge Instructions (Addendum)
Take the Diflucan after you've completed the antibiotics.

## 2018-09-08 NOTE — ED Triage Notes (Signed)
Pt reports that she has burning and frequency with urination for about 2 days, associated with lower back pain. Pt reports rash on her labial area that gets better with medications. She also has had vaginal bleeding "spotting" that started yesterday, but reports it is not time for her cycle.

## 2018-09-08 NOTE — ED Provider Notes (Signed)
[facility] EMERGENCY DEPARTMENT Provider Note   CSN: [id number] Arrival date & time: 09/07/18  2348     History   Chief Complaint Chief Complaint  Patient presents with  . Urinary Frequency    HPI Beverly Johnson is a 43 y.o. female.  Patient presents to the emergency department with a chief complaint of urinary frequency and urgency.  Also reports having some mild incontinence of urine.  States that she has an itchy rash near her external genitalia.  States symptoms started a few days ago.  Denies any fevers or chills.  Denies any nausea or vomiting.  Reports some mild flank pain.  No treatments prior to arrival.  The history is provided by the patient. No language interpreter was used.    Past Medical History:  Diagnosis Date  . Diabetes mellitus without complication ([place])   . Gallstones 03/10/2011  . HIV infection ([place])   . Immune deficiency disorder [facility])     Patient Active Problem List   Diagnosis Date Noted  . Medication monitoring encounter 11/12/2017  . Breast nodule 09/02/2017  . Screening examination for venereal disease 03/30/2017  . Encounter for long-term (current) use of high-risk medication 03/30/2017  . Microalbuminuria due to type 2 diabetes mellitus ([place]) 03/30/2017  . Knee pain, chronic 03/30/2017  . Vitamin D deficiency 03/21/2017  . Bilateral leg cramps 01/22/2017  . Healthcare maintenance 03/02/2014  . Morbid obesity with BMI of 45.0-49.9, adult ([place]) 04/27/2012  . Uncontrolled type 2 diabetes mellitus ([place]) 12/08/2010  . Human immunodeficiency virus (HIV) disease ([place]) 09/29/2006    Past Surgical History:  Procedure Laterality Date  . CHOLECYSTECTOMY  06/29/2011   Procedure: LAPAROSCOPIC CHOLECYSTECTOMY WITH INTRAOPERATIVE CHOLANGIOGRAM;  Surgeon: Merrie Roof, MD;  Location: [facility];  Service: General;  Laterality: N/A;  . NO PAST SURGERIES       OB History   No obstetric history on file.      Home Medications     Prior to Admission medications   Medication Sig Start Date End Date Taking? Authorizing Provider  GENVOYA 150-150-200-10 MG TABS tablet TAKE 1 TABLET BY MOUTH DAILY WITH BREAKFAST. Patient taking differently: Take 1 tablet by mouth daily with breakfast.  09/02/18  Yes Comer, Okey Regal, MD  metFORMIN (GLUCOPHAGE) 500 MG tablet Take 2 tablets (1,000 mg total) by mouth 2 (two) times daily with a meal. 03/22/17 09/08/26 Yes Ledell Noss, MD  sitaGLIPtin (JANUVIA) 100 MG tablet Take 1 tablet (100 mg total) by mouth daily. 08/05/17  Yes Neva Seat, MD  fluconazole (DIFLUCAN) 150 MG tablet Take 1 tablet today, if your symptoms have not resolved repeat this in 72 hours. Patient not taking: Reported on 09/08/2018 02/14/18   Petrucelli, Aldona Bar R, PA-C  glucose blood (ACCU-CHEK ACTIVE STRIPS) test strip Use as instructed 05/03/18   Katy Fitch, MD  miconazole (MONISTAT 7) 2 % vaginal cream Place 1 Applicatorful vaginally at bedtime. Patient not taking: Reported on 09/08/2018 02/14/18   Petrucelli, Glynda Jaeger, PA-C    Family History No family history on file.  Social History Social History   Tobacco Use  . Smoking status: Never Smoker  . Smokeless tobacco: Never Used  Substance Use Topics  . Alcohol use: No  . Drug use: No     Allergies   Patient has no known allergies.   Review of Systems Review of Systems  All other systems reviewed and are negative.    Physical Exam Updated Vital Signs BP 116/79  Pulse 73   Temp 98.4 F (36.9 C) (Oral)   Resp 14   LMP 08/08/2018   SpO2 97%   Physical Exam Vitals signs and nursing note reviewed.  Constitutional:      General: She is not in acute distress.    Appearance: She is not diaphoretic.  HENT:     Head: Normocephalic and atraumatic.  Eyes:     Conjunctiva/sclera: Conjunctivae normal.     Pupils: Pupils are equal, round, and reactive to light.  Neck:     Trachea: No tracheal deviation.  Cardiovascular:     Rate and Rhythm:  Normal rate.  Pulmonary:     Effort: Pulmonary effort is normal. No respiratory distress.  Abdominal:     General: There is no distension.     Palpations: Abdomen is soft.     Tenderness: There is no abdominal tenderness.     Comments: No focal abdominal tenderness, no suprapubic tenderness  Genitourinary:    Comments: Chaperone present for exam, mildly irritated skin surrounding external genitalia Musculoskeletal: Normal range of motion.  Skin:    General: Skin is warm and dry.  Neurological:     Mental Status: She is alert and oriented to person, place, and time.  Psychiatric:        Judgment: Judgment normal.      ED Treatments / Results  Labs (all labs ordered are listed, but only abnormal results are displayed) Labs Reviewed  URINALYSIS, ROUTINE W REFLEX MICROSCOPIC - Abnormal; Notable for the following components:      Result Value   APPearance CLOUDY (*)    Glucose, UA >=500 (*)    Hgb urine dipstick LARGE (*)    Leukocytes, UA MODERATE (*)    RBC / HPF >50 (*)    Bacteria, UA MANY (*)    All other components within normal limits  CBG MONITORING, ED - Abnormal; Notable for the following components:   Glucose-Capillary 265 (*)    All other components within normal limits  POC URINE PREG, ED    EKG None  Radiology No results found.  Procedures Procedures (including critical care time)  Medications Ordered in ED Medications - No data to display   Initial Impression / Assessment and Plan / ED Course  I have reviewed the triage vital signs and the nursing notes.  Pertinent labs & imaging results that were available during my care of the patient were reviewed by me and considered in my medical decision making (see chart for details).     Patient with symptoms consistent with UTI and yeast infection.  Given treatment here.  Compliant on HIV meds.  Last CD4 count reassuring.  Final Clinical Impressions(s) / ED Diagnoses   Final diagnoses:  Acute  cystitis without hematuria    ED Discharge Orders    None       Montine Circle, PA-C 25/49/82 6415    Delora Fuel, MD 83/09/40 [phone]

## 2018-10-28 ENCOUNTER — Other Ambulatory Visit: Payer: Self-pay

## 2018-10-28 ENCOUNTER — Emergency Department (HOSPITAL_COMMUNITY): Payer: Medicaid Other

## 2018-10-28 ENCOUNTER — Encounter (HOSPITAL_COMMUNITY): Payer: Self-pay | Admitting: *Deleted

## 2018-10-28 ENCOUNTER — Emergency Department (HOSPITAL_COMMUNITY)
Admission: EM | Admit: 2018-10-28 | Discharge: 2018-10-28 | Disposition: A | Discharge status: Home / Self Care | Payer: Medicaid Other | Attending: Emergency Medicine | Admitting: Emergency Medicine

## 2018-10-28 DIAGNOSIS — Z79899 Other long term (current) drug therapy: Secondary | ICD-10-CM | POA: Diagnosis not present

## 2018-10-28 DIAGNOSIS — R42 Dizziness and giddiness: Secondary | ICD-10-CM | POA: Diagnosis not present

## 2018-10-28 DIAGNOSIS — M25512 Pain in left shoulder: Secondary | ICD-10-CM | POA: Diagnosis not present

## 2018-10-28 DIAGNOSIS — Z7984 Long term (current) use of oral hypoglycemic drugs: Secondary | ICD-10-CM | POA: Diagnosis not present

## 2018-10-28 DIAGNOSIS — R0789 Other chest pain: Secondary | ICD-10-CM | POA: Diagnosis not present

## 2018-10-28 DIAGNOSIS — E1165 Type 2 diabetes mellitus with hyperglycemia: Secondary | ICD-10-CM | POA: Insufficient documentation

## 2018-10-28 DIAGNOSIS — R1013 Epigastric pain: Secondary | ICD-10-CM | POA: Insufficient documentation

## 2018-10-28 DIAGNOSIS — R110 Nausea: Secondary | ICD-10-CM | POA: Insufficient documentation

## 2018-10-28 DIAGNOSIS — R739 Hyperglycemia, unspecified: Secondary | ICD-10-CM

## 2018-10-28 DIAGNOSIS — B20 Human immunodeficiency virus [HIV] disease: Secondary | ICD-10-CM | POA: Diagnosis not present

## 2018-10-28 DIAGNOSIS — M542 Cervicalgia: Secondary | ICD-10-CM | POA: Diagnosis not present

## 2018-10-28 DIAGNOSIS — R079 Chest pain, unspecified: Secondary | ICD-10-CM | POA: Diagnosis not present

## 2018-10-28 LAB — CBC WITH DIFFERENTIAL/PLATELET
ABS IMMATURE GRANULOCYTES: 0.01 10*3/uL (ref 0.00–0.07)
Basophils Absolute: 0 10*3/uL (ref 0.0–0.1)
Basophils Relative: 0 %
Eosinophils Absolute: 0 10*3/uL (ref 0.0–0.5)
Eosinophils Relative: 1 %
HCT: 36.9 % (ref 36.0–46.0)
Hemoglobin: 11.1 g/dL — ABNORMAL LOW (ref 12.0–15.0)
Immature Granulocytes: 0 %
Lymphocytes Relative: 42 %
Lymphs Abs: 1.9 10*3/uL (ref 0.7–4.0)
MCH: 22.6 pg — ABNORMAL LOW (ref 26.0–34.0)
MCHC: 30.1 g/dL (ref 30.0–36.0)
MCV: 75.2 fL — ABNORMAL LOW (ref 80.0–100.0)
MONOS PCT: 6 %
Monocytes Absolute: 0.3 10*3/uL (ref 0.1–1.0)
Neutro Abs: 2.4 10*3/uL (ref 1.7–7.7)
Neutrophils Relative %: 51 %
Platelets: 196 10*3/uL (ref 150–400)
RBC: 4.91 MIL/uL (ref 3.87–5.11)
RDW: 14.3 % (ref 11.5–15.5)
WBC: 4.6 10*3/uL (ref 4.0–10.5)
nRBC: 0 % (ref 0.0–0.2)

## 2018-10-28 LAB — I-STAT TROPONIN, ED: TROPONIN I, POC: 0 ng/mL (ref 0.00–0.08)

## 2018-10-28 LAB — COMPREHENSIVE METABOLIC PANEL
ALT: 18 U/L (ref 0–44)
AST: 29 U/L (ref 15–41)
Albumin: 3.3 g/dL — ABNORMAL LOW (ref 3.5–5.0)
Alkaline Phosphatase: 183 U/L — ABNORMAL HIGH (ref 38–126)
Anion gap: 7 (ref 5–15)
BUN: 6 mg/dL (ref 6–20)
CO2: 24 mmol/L (ref 22–32)
Calcium: 8.7 mg/dL — ABNORMAL LOW (ref 8.9–10.3)
Chloride: 100 mmol/L (ref 98–111)
Creatinine, Ser: 0.71 mg/dL (ref 0.44–1.00)
GFR calc Af Amer: >60 mL/min (ref >60)
GFR calc non Af Amer: >60 mL/min (ref >60)
Glucose, Bld: 398 mg/dL — ABNORMAL HIGH (ref 70–99)
Potassium: 3.5 mmol/L (ref 3.5–5.1)
Sodium: 131 mmol/L — ABNORMAL LOW (ref 135–145)
Total Bilirubin: 0.5 mg/dL (ref 0.3–1.2)
Total Protein: 7.7 g/dL (ref 6.5–8.1)

## 2018-10-28 LAB — CBG MONITORING, ED: Glucose-Capillary: 299 mg/dL — ABNORMAL HIGH (ref 70–99)

## 2018-10-28 LAB — I-STAT BETA HCG BLOOD, ED (MC, WL, AP ONLY): I-stat hCG, quantitative: <5 m[IU]/mL (ref <5)

## 2018-10-28 LAB — LIPASE, BLOOD: Lipase: 48 U/L (ref 11–51)

## 2018-10-28 MED ORDER — KETOROLAC TROMETHAMINE 30 MG/ML IJ SOLN
30.0000 mg | Freq: Once | INTRAMUSCULAR | Status: AC
  Administered 2018-10-28: 30 mg via INTRAVENOUS
  Filled 2018-10-28: qty 1

## 2018-10-28 MED ORDER — OXYCODONE-ACETAMINOPHEN 5-325 MG PO TABS
1.0000 | ORAL_TABLET | Freq: Once | ORAL | Status: AC
  Administered 2018-10-28: 1 via ORAL
  Filled 2018-10-28: qty 1

## 2018-10-28 MED ORDER — ONDANSETRON HCL 4 MG/2ML IJ SOLN
4.0000 mg | Freq: Once | INTRAMUSCULAR | Status: AC
  Administered 2018-10-28: 4 mg via INTRAVENOUS
  Filled 2018-10-28: qty 2

## 2018-10-28 MED ORDER — INSULIN ASPART 100 UNIT/ML ~~LOC~~ SOLN: 10.0000 [IU] | Freq: Once | SUBCUTANEOUS | Status: DC

## 2018-10-28 MED ORDER — SODIUM CHLORIDE 0.9 % IV BOLUS
1000.0000 mL | Freq: Once | INTRAVENOUS | Status: AC
  Administered 2018-10-28: 1000 mL via INTRAVENOUS

## 2018-10-28 NOTE — ED Triage Notes (Signed)
C/o centralized chest pain with radiation to left neck also to right sholder and into her back , off and on for 1 year. Worse at work today, c/o occ. Abd. Pain c/o nausea today , c//o having problems sleeping.

## 2018-10-28 NOTE — ED Notes (Signed)
Pt CBG, 299. RN notified.

## 2018-10-28 NOTE — Discharge Instructions (Addendum)
Your work-up today was very reassuring.  We did not find evidence of an acute problem with your heart or lungs.  I suspect your pain is musculoskeletal as you are very tender over the muscles in your chest wall.  Please continue to treat pain at home with Motrin, Tylenol, over-the-counter lidocaine patches, ice and heat.  Follow-up with your primary care doctor if symptoms or not improving.  Return to the emergency department if you have worsening chest pain, shortness of breath feel lightheaded or like you may pass out or any other new or concerning symptoms occur.  Your blood sugar was elevated today, please make sure you are taking her diabetes medications regularly as directed and follow-up with your regular doctor for continued management of this.

## 2018-10-28 NOTE — ED Notes (Signed)
Pt refusing 10 units of insulin.  States "my blood sugar will come down it always does I dont take that". Educated on importance of CBG management and treatment with insulin, pt still adamantly refuses.

## 2018-10-28 NOTE — ED Provider Notes (Signed)
[facility] EMERGENCY DEPARTMENT Provider Note   CSN: [id number] Arrival date & time: 10/28/18  4854    History   Chief Complaint Chief Complaint  Patient presents with  . Chest Pain    HPI Beverly Johnson is a 43 y.o. female.     Beverly Johnson is a 43 y.o. female with a history of diabetes, HIV and gallstones s/p cholecystectomy, who presents to the emergency department for evaluation of central chest pain.  She reports while at work this morning at [employer] she had chest pain that starts in the middle of her chest and radiates up to her left shoulder and neck as well as around her back and right shoulder.  Pain is severe, patient reports 10/10.  Pain is worse with movement and palpation.  Pain is not worse with inspiration.  Pain is not exertional.  She also reports some associated nausea but no vomiting.  No diaphoresis.  Reports some mild epigastric pain.  Denies associated shortness of breath.  Reports when pain worsened at work today she felt a bit lightheaded but did not have any syncopal episodes.  She does not have any lower extremity swelling or pain.  Patient reports she has had similar chest pains intermittently for the past year, has been seen in the emergency department for them previously she has never been told it is a problem with her heart or lungs.  Chart review reveals patient has been seen previously and diagnosed with chest wall pain.  Patient does have history of diabetes and HIV, reports she has been taking her medications regularly.  No fevers or chills, no cough. Denies exogenous estrogen use, lower extremity pain or swelling, recent travel or immobilization, history of PE or DVT, family or personal history of bleeding or clotting disorders, or hemoptysis.      Past Medical History:  Diagnosis Date  . Diabetes mellitus without complication ([place])   . Gallstones 03/10/2011  . HIV infection ([place])   . Immune deficiency disorder [facility])      Patient Active Problem List   Diagnosis Date Noted  . Medication monitoring encounter 11/12/2017  . Breast nodule 09/02/2017  . Screening examination for venereal disease 03/30/2017  . Encounter for long-term (current) use of high-risk medication 03/30/2017  . Microalbuminuria due to type 2 diabetes mellitus ([place]) 03/30/2017  . Knee pain, chronic 03/30/2017  . Vitamin D deficiency 03/21/2017  . Bilateral leg cramps 01/22/2017  . Healthcare maintenance 03/02/2014  . Morbid obesity with BMI of 45.0-49.9, adult ([place]) 04/27/2012  . Uncontrolled type 2 diabetes mellitus ([place]) 12/08/2010  . Human immunodeficiency virus (HIV) disease ([place]) 09/29/2006    Past Surgical History:  Procedure Laterality Date  . CHOLECYSTECTOMY  06/29/2011   Procedure: LAPAROSCOPIC CHOLECYSTECTOMY WITH INTRAOPERATIVE CHOLANGIOGRAM;  Surgeon: Merrie Roof, MD;  Location: [facility];  Service: General;  Laterality: N/A;  . NO PAST SURGERIES       OB History   No obstetric history on file.      Home Medications    Prior to Admission medications   Medication Sig Start Date End Date Taking? Authorizing Provider  GENVOYA 150-150-200-10 MG TABS tablet TAKE 1 TABLET BY MOUTH DAILY WITH BREAKFAST. Patient taking differently: Take 1 tablet by mouth daily with breakfast.  09/02/18  Yes Comer, Okey Regal, MD  metFORMIN (GLUCOPHAGE) 500 MG tablet Take 2 tablets (1,000 mg total) by mouth 2 (two) times daily with a meal. 03/22/17 09/08/26 Yes Ledell Noss, MD  sitaGLIPtin (JANUVIA) 100 MG tablet Take 1 tablet (100 mg total) by mouth daily. 08/05/17  Yes Neva Seat, MD  glucose blood [facility] ACTIVE STRIPS) test strip Use as instructed 05/03/18   Katy Fitch, MD    Family History No family history on file.  Social History Social History   Tobacco Use  . Smoking status: Never Smoker  . Smokeless tobacco: Never Used  Substance Use Topics  . Alcohol use: No  . Drug use: No     Allergies   Patient has no  known allergies.   Review of Systems Review of Systems  Constitutional: Negative for chills and fever.  HENT: Negative.   Eyes: Negative for visual disturbance.  Respiratory: Negative for cough, shortness of breath and wheezing.   Cardiovascular: Positive for chest pain. Negative for palpitations and leg swelling.  Gastrointestinal: Positive for abdominal pain and nausea. Negative for blood in stool, constipation, diarrhea and vomiting.  Genitourinary: Negative for dysuria, flank pain and frequency.  Musculoskeletal: Negative for arthralgias, back pain, myalgias and neck pain.  Skin: Negative for color change and rash.  Neurological: Positive for light-headedness. Negative for dizziness, syncope, speech difficulty, weakness, numbness and headaches.  All other systems reviewed and are negative.    Physical Exam Updated Vital Signs BP 116/79   Pulse 86   Temp 98.3 F (36.8 C) (Oral)   Resp 20   Ht 5\' 7"  (1.702 m)   Wt 117.9 kg   LMP 10/26/2018   SpO2 99%   BMI 40.72 kg/m    Physical Exam Vitals signs and nursing note reviewed.  Constitutional:      General: She is not in acute distress.    Appearance: She is well-developed. She is obese. She is not diaphoretic.     Comments: Patient appears uncomfortable but is in no acute distress.  HENT:     Head: Normocephalic and atraumatic.  Eyes:     General:        Right eye: No discharge.        Left eye: No discharge.     Pupils: Pupils are equal, round, and reactive to light.  Neck:     Musculoskeletal: Neck supple.  Cardiovascular:     Rate and Rhythm: Normal rate and regular rhythm.     Pulses:          Radial pulses are 2+ on the right side and 2+ on the left side.       Dorsalis pedis pulses are 2+ on the right side and 2+ on the left side.       Posterior tibial pulses are 2+ on the right side and 2+ on the left side.     Heart sounds: Normal heart sounds. No murmur. No friction rub.  Pulmonary:     Effort:  Pulmonary effort is normal. No respiratory distress.     Breath sounds: Normal breath sounds. No wheezing or rales.     Comments: Respirations equal and unlabored, patient able to speak in full sentences, lungs clear to auscultation bilaterally Chest:     Chest wall: Tenderness present. No deformity.     Comments: Tenderness to palpation across the chest wall without palpable deformity or overlying skin changes. Abdominal:     General: Bowel sounds are normal. There is no distension.     Palpations: Abdomen is soft. There is no mass.     Tenderness: There is no abdominal tenderness. There is no guarding.  Musculoskeletal:  General: No deformity.     Right lower leg: She exhibits no tenderness. No edema.     Left lower leg: She exhibits no tenderness. No edema.     Comments: Bilateral lower extremities without edema.  Skin:    General: Skin is warm and dry.     Capillary Refill: Capillary refill takes less than 2 seconds.  Neurological:     Mental Status: She is alert and oriented to person, place, and time.     Coordination: Coordination normal.     Comments: Speech is clear, able to follow commands CN III-XII intact Normal strength in upper and lower extremities bilaterally including dorsiflexion and plantar flexion, strong and equal grip strength Sensation normal to light and sharp touch Moves extremities without ataxia, coordination intact   Psychiatric:        Mood and Affect: Mood normal.        Behavior: Behavior normal.      ED Treatments / Results  Labs (all labs ordered are listed, but only abnormal results are displayed) Labs Reviewed  COMPREHENSIVE METABOLIC PANEL - Abnormal; Notable for the following components:      Result Value   Sodium 131 (*)    Glucose, Bld 398 (*)    Calcium 8.7 (*)    Albumin 3.3 (*)    Alkaline Phosphatase 183 (*)    All other components within normal limits  CBC WITH DIFFERENTIAL/PLATELET - Abnormal; Notable for the following  components:   Hemoglobin 11.1 (*)    MCV 75.2 (*)    MCH 22.6 (*)    All other components within normal limits  LIPASE, BLOOD  URINALYSIS, ROUTINE W REFLEX MICROSCOPIC  I-STAT BETA HCG BLOOD, ED (MC, [facility], AP ONLY)  I-STAT TROPONIN, ED    EKG EKG Interpretation  Date/Time:  Friday October 28 2018 08:51:09 EDT Ventricular Rate:  86 PR Interval:    QRS Duration: 83 QT Interval:  360 QTC Calculation: 431 R Axis:   47 Text Interpretation:  Sinus rhythm Nonspecific T wave abnormality Confirmed by Lajean Saver [phone]) on 10/28/2018 8:55:53 AM   Radiology Dg Chest 2 View  Result Date: 10/28/2018 CLINICAL DATA:  C/o onset centralized chest pain with radiation to left neck also to right sholder and into her back , off and on for 1 year. Worse at work today, c/o occ. Abd. Pain c/o nausea today , c//o having problems sleeping. Hx DM, nonsmoker. CP EXAM: CHEST - 2 VIEW COMPARISON:  None. FINDINGS: Normal mediastinum and cardiac silhouette. Normal pulmonary vasculature. No evidence of effusion, infiltrate, or pneumothorax. No acute bony abnormality. IMPRESSION: Normal chest radiograph. Electronically Signed   By: Suzy Bouchard M.D.   On: 10/28/2018 09:48    Procedures Procedures (including critical care time)  Medications Ordered in ED Medications  sodium chloride 0.9 % bolus 1,000 mL (has no administration in time range)  ondansetron (ZOFRAN) injection 4 mg (4 mg Intravenous Given 10/28/18 0945)  ketorolac (TORADOL) 30 MG/ML injection 30 mg (30 mg Intravenous Given 10/28/18 0946)     Initial Impression / Assessment and Plan / ED Course  I have reviewed the triage vital signs and the nursing notes.  Pertinent labs & imaging results that were available during my care of the patient were reviewed by me and considered in my medical decision making (see chart for details).  Patient presents with generalized pain throughout her chest, this pain has been occurring intermittently for the past  year.  Pain is reproducible with palpation  on exam.  Chest pain is not likely of cardiac or pulmonary etiology d/t presentation, PERC negative, VSS, no tracheal deviation, no JVD or new murmur, RRR, breath sounds equal bilaterally, EKG without acute abnormalities, negative troponin, and negative CXR.  Labs otherwise significant for glucose of 398 without signs of DKA.  Patient given 1 L IV fluids and glucose improved to 299.  Patient refused insulin to lower sugar further, reports she will go home and take her diabetes medications which she forgot to take this morning.  Chest pain has improved significantly with treatment here in the emergency department.  Patient is to be discharged with recommendation to follow up with PCP in regards to today's hospital visit. Pt has been advised to return to the ED if CP becomes exertional, associated with diaphoresis or nausea, radiates to left jaw/arm, worsens or becomes concerning in any way. Pt appears reliable for follow up and is agreeable to discharge.   Case has been discussed Dr. Ashok Cordia who agrees with the above plan to discharge.    Final Clinical Impressions(s) / ED Diagnoses   Final diagnoses:  Atypical chest pain  Chest wall pain  Hyperglycemia    ED Discharge Orders    None       Jacqlyn Larsen, [place] 10/28/18 1344    Lajean Saver, MD 10/28/18 [phone]

## 2018-11-15 ENCOUNTER — Other Ambulatory Visit: Payer: Medicaid Other

## 2018-11-15 ENCOUNTER — Other Ambulatory Visit: Payer: Self-pay | Admitting: *Deleted

## 2018-11-15 ENCOUNTER — Other Ambulatory Visit (HOSPITAL_COMMUNITY)
Admission: RE | Admit: 2018-11-15 | Discharge: 2018-11-15 | Disposition: A | Discharge status: Home / Self Care | Payer: Medicaid Other | Source: Ambulatory Visit | Attending: Internal Medicine | Admitting: Internal Medicine

## 2018-11-15 ENCOUNTER — Other Ambulatory Visit: Payer: Self-pay

## 2018-11-15 DIAGNOSIS — Z113 Encounter for screening for infections with a predominantly sexual mode of transmission: Secondary | ICD-10-CM | POA: Insufficient documentation

## 2018-11-15 DIAGNOSIS — B20 Human immunodeficiency virus [HIV] disease: Secondary | ICD-10-CM

## 2018-11-16 LAB — T-HELPER CELL (CD4) - (RCID CLINIC ONLY)
CD4 % Helper T Cell: 40 % (ref 33–55)
CD4 T Cell Abs: 600 /uL (ref 400–2700)

## 2018-11-16 LAB — URINE CYTOLOGY ANCILLARY ONLY
Chlamydia: NEGATIVE
Neisseria Gonorrhea: NEGATIVE

## 2018-11-24 LAB — COMPLETE METABOLIC PANEL WITH GFR
AG Ratio: 1 (calc) (ref 1.0–2.5)
ALT: 10 U/L (ref 6–29)
AST: 14 U/L (ref 10–30)
Albumin: 3.8 g/dL (ref 3.6–5.1)
Alkaline phosphatase (APISO): 133 U/L — ABNORMAL HIGH (ref 31–125)
BUN: 8 mg/dL (ref 7–25)
CO2: 25 mmol/L (ref 20–32)
Calcium: 9 mg/dL (ref 8.6–10.2)
Chloride: 105 mmol/L (ref 98–110)
Creat: 0.65 mg/dL (ref 0.50–1.10)
GFR, Est African American: 128 mL/min/{1.73_m2} (ref >=60)
GFR, Est Non African American: 110 mL/min/{1.73_m2} (ref >=60)
Globulin: 4 g/dL (calc) — ABNORMAL HIGH (ref 1.9–3.7)
Glucose, Bld: 264 mg/dL — ABNORMAL HIGH (ref 65–99)
Potassium: 3.8 mmol/L (ref 3.5–5.3)
Sodium: 137 mmol/L (ref 135–146)
Total Bilirubin: 0.3 mg/dL (ref 0.2–1.2)
Total Protein: 7.8 g/dL (ref 6.1–8.1)

## 2018-11-24 LAB — CBC WITH DIFFERENTIAL/PLATELET
Absolute Monocytes: 281 cells/uL (ref 200–950)
BASOS PCT: 0.3 %
Basophils Absolute: 11 cells/uL (ref 0–200)
Eosinophils Absolute: 19 cells/uL (ref 15–500)
Eosinophils Relative: 0.5 %
HCT: 32.9 % — ABNORMAL LOW (ref 35.0–45.0)
Hemoglobin: 10.2 g/dL — ABNORMAL LOW (ref 11.7–15.5)
Lymphs Abs: 1458 cells/uL (ref 850–3900)
MCH: 23 pg — ABNORMAL LOW (ref 27.0–33.0)
MCHC: 31 g/dL — ABNORMAL LOW (ref 32.0–36.0)
MCV: 74.1 fL — ABNORMAL LOW (ref 80.0–100.0)
MPV: 11.9 fL (ref 7.5–12.5)
Monocytes Relative: 7.6 %
Neutro Abs: 1931 cells/uL (ref 1500–7800)
Neutrophils Relative %: 52.2 %
Platelets: 184 10*3/uL (ref 140–400)
RBC: 4.44 10*6/uL (ref 3.80–5.10)
RDW: 14.1 % (ref 11.0–15.0)
Total Lymphocyte: 39.4 %
WBC: 3.7 10*3/uL — AB (ref 3.8–10.8)

## 2018-11-24 LAB — HIV-1 RNA QUANT-NO REFLEX-BLD
HIV 1 RNA Quant: <20 copies/mL
HIV-1 RNA Quant, Log: <1.3 Log copies/mL

## 2018-11-24 LAB — RPR: RPR Ser Ql: NONREACTIVE

## 2018-11-29 ENCOUNTER — Ambulatory Visit (INDEPENDENT_AMBULATORY_CARE_PROVIDER_SITE_OTHER): Payer: Medicaid Other | Admitting: Internal Medicine

## 2018-11-29 ENCOUNTER — Other Ambulatory Visit: Payer: Self-pay

## 2018-11-29 ENCOUNTER — Encounter: Payer: Self-pay | Admitting: Internal Medicine

## 2018-11-29 DIAGNOSIS — B20 Human immunodeficiency virus [HIV] disease: Secondary | ICD-10-CM

## 2018-11-29 DIAGNOSIS — Z113 Encounter for screening for infections with a predominantly sexual mode of transmission: Secondary | ICD-10-CM

## 2018-11-29 DIAGNOSIS — E1165 Type 2 diabetes mellitus with hyperglycemia: Secondary | ICD-10-CM | POA: Diagnosis not present

## 2018-11-29 NOTE — Assessment & Plan Note (Signed)
I have encouraged her to get back to her PCP

## 2018-11-29 NOTE — Progress Notes (Signed)
   Subjective:    Patient ID: Beverly Johnson, female    DOB: 1975-12-04, 43 y.o.   MRN: [id number]  HPI Follow up for HIV She has been on Genvoya and denies any missed doses.  No new issues.  No missed doses.  CD4 of 600 and viral load < 20.  No complaints today.  No associated n/v/d.   Blood sugar remains up. She is taking Metformin but has not been back to her PCP.   Review of Systems  Gastrointestinal: Negative for diarrhea and nausea.  Endocrine: Negative for polydipsia.  Skin: Negative for rash.       Objective:   Physical Exam        Assessment & Plan:

## 2018-11-29 NOTE — Assessment & Plan Note (Signed)
Screened negative 

## 2018-11-29 NOTE — Assessment & Plan Note (Addendum)
She is doing well.  Labs are reassurring.  She will rtc in 6 months.  Due for Prevnar  Evisit: 15 minutes spent including discussion of her sugar management.

## 2018-12-20 ENCOUNTER — Telehealth: Payer: Self-pay | Admitting: *Deleted

## 2018-12-20 NOTE — Telephone Encounter (Signed)
Patient called to see if she could make an appointment for Dr Linus Salmons to look at her foot. She has had a painful spot for several months at the ball of her foot and it is hurting her more lately. She no longer has primary care (was discharged from Internal Medicine for too many no-shows), does have diabetes.  RN encouraged her to establish primary care, relaying the importance of this piece.  She is insured.  Patient will call [facility] to see if she can establish there. Landis Gandy, RN

## 2019-02-07 ENCOUNTER — Other Ambulatory Visit: Payer: Self-pay

## 2019-02-07 ENCOUNTER — Emergency Department (HOSPITAL_COMMUNITY)
Admission: EM | Admit: 2019-02-07 | Discharge: 2019-02-07 | Disposition: A | Discharge status: Home / Self Care | Payer: Medicaid Other | Attending: Emergency Medicine | Admitting: Emergency Medicine

## 2019-02-07 ENCOUNTER — Encounter (HOSPITAL_COMMUNITY): Payer: Self-pay

## 2019-02-07 DIAGNOSIS — L243 Irritant contact dermatitis due to cosmetics: Secondary | ICD-10-CM

## 2019-02-07 DIAGNOSIS — E119 Type 2 diabetes mellitus without complications: Secondary | ICD-10-CM | POA: Diagnosis not present

## 2019-02-07 DIAGNOSIS — L299 Pruritus, unspecified: Secondary | ICD-10-CM

## 2019-02-07 DIAGNOSIS — T7840XA Allergy, unspecified, initial encounter: Secondary | ICD-10-CM | POA: Diagnosis not present

## 2019-02-07 DIAGNOSIS — Z79899 Other long term (current) drug therapy: Secondary | ICD-10-CM | POA: Diagnosis not present

## 2019-02-07 DIAGNOSIS — Z7984 Long term (current) use of oral hypoglycemic drugs: Secondary | ICD-10-CM | POA: Insufficient documentation

## 2019-02-07 DIAGNOSIS — T300 Burn of unspecified body region, unspecified degree: Secondary | ICD-10-CM | POA: Diagnosis not present

## 2019-02-07 DIAGNOSIS — L232 Allergic contact dermatitis due to cosmetics: Secondary | ICD-10-CM | POA: Diagnosis not present

## 2019-02-07 DIAGNOSIS — R21 Rash and other nonspecific skin eruption: Secondary | ICD-10-CM | POA: Diagnosis not present

## 2019-02-07 MED ORDER — CEPHALEXIN 500 MG PO CAPS: 500.0000 mg | ORAL_CAPSULE | Freq: Four times a day (QID) | ORAL | 0 refills | Status: DC

## 2019-02-07 MED ORDER — HYDROXYZINE HCL 25 MG PO TABS: 25.0000 mg | ORAL_TABLET | ORAL | 0 refills | Status: DC | PRN

## 2019-02-07 MED ORDER — HYDROXYZINE HCL 25 MG PO TABS
50.0000 mg | ORAL_TABLET | Freq: Once | ORAL | Status: AC
  Administered 2019-02-07: 50 mg via ORAL
  Filled 2019-02-07: qty 2

## 2019-02-07 NOTE — ED Notes (Signed)
Bed: WLPT1 Expected date:  Expected time:  Means of arrival:  Comments: 

## 2019-02-07 NOTE — ED Provider Notes (Signed)
[facility] DEPT Provider Note   CSN: [id number] Arrival date & time: 02/07/19  0608     History   Chief Complaint Chief Complaint  Patient presents with  . Pruritis    Scalp    HPI Beverly Johnson is a 43 y.o. female.     HPI Patient presents to the emergency department with severe itching and irritation of her scalp.  The patient states she had braids placed last Wednesday they started irritating her and it is gotten worse over that timeframe.  She states she did go back to the place where she had a hair done and they sprayed it with some oils which did not seem to help very much.  Patient states she has a very tight feeling in her scalp from front to back.  Patient states she is also getting irritation along the back of the hairline in the neck and the frontal hairline.  Patient denies any other symptoms.  Patient denies any fever, nausea, vomiting, weakness, dizziness, blurred vision or syncope. Past Medical History:  Diagnosis Date  . Diabetes mellitus without complication ([place])   . Gallstones 03/10/2011  . HIV infection ([place])   . Immune deficiency disorder [facility])     Patient Active Problem List   Diagnosis Date Noted  . Medication monitoring encounter 11/12/2017  . Breast nodule 09/02/2017  . Screening examination for venereal disease 03/30/2017  . Encounter for long-term (current) use of high-risk medication 03/30/2017  . Microalbuminuria due to type 2 diabetes mellitus ([place]) 03/30/2017  . Knee pain, chronic 03/30/2017  . Vitamin D deficiency 03/21/2017  . Bilateral leg cramps 01/22/2017  . Healthcare maintenance 03/02/2014  . Morbid obesity with BMI of 45.0-49.9, adult ([place]) 04/27/2012  . Uncontrolled type 2 diabetes mellitus ([place]) 12/08/2010  . Human immunodeficiency virus (HIV) disease ([place]) 09/29/2006    Past Surgical History:  Procedure Laterality Date  . CHOLECYSTECTOMY  06/29/2011   Procedure: LAPAROSCOPIC CHOLECYSTECTOMY  WITH INTRAOPERATIVE CHOLANGIOGRAM;  Surgeon: Merrie Roof, MD;  Location: [facility];  Service: General;  Laterality: N/A;  . NO PAST SURGERIES       OB History   No obstetric history on file.      Home Medications    Prior to Admission medications   Medication Sig Start Date End Date Taking? Authorizing Provider  GENVOYA 150-150-200-10 MG TABS tablet TAKE 1 TABLET BY MOUTH DAILY WITH BREAKFAST. Patient taking differently: Take 1 tablet by mouth daily with breakfast.  09/02/18   Comer, Okey Regal, MD  glucose blood (ACCU-CHEK ACTIVE STRIPS) test strip Use as instructed 05/03/18   Katy Fitch, MD  metFORMIN (GLUCOPHAGE) 500 MG tablet Take 2 tablets (1,000 mg total) by mouth 2 (two) times daily with a meal. 03/22/17 09/08/26  Ledell Noss, MD  sitaGLIPtin (JANUVIA) 100 MG tablet Take 1 tablet (100 mg total) by mouth daily. 08/05/17   Neva Seat, MD    Family History No family history on file.  Social History Social History   Tobacco Use  . Smoking status: Never Smoker  . Smokeless tobacco: Never Used  Substance Use Topics  . Alcohol use: No  . Drug use: No     Allergies   Patient has no known allergies.   Review of Systems Review of Systems All other systems negative except as documented in the HPI. All pertinent positives and negatives as reviewed in the HPI.  Physical Exam Updated Vital Signs BP (!) 160/100 (BP Location: Left Arm)  Pulse (!) 118   Temp (!) 97.5 F (36.4 C) (Oral)   Resp 17   SpO2 100%   Physical Exam Vitals signs and nursing note reviewed.  Constitutional:      General: She is not in acute distress.    Appearance: She is well-developed.  HENT:     Head: Normocephalic and atraumatic.   Eyes:     Pupils: Pupils are equal, round, and reactive to light.  Pulmonary:     Effort: Pulmonary effort is normal.  Skin:    General: Skin is warm and dry.  Neurological:     Mental Status: She is alert and oriented to person, place, and time.       ED Treatments / Results  Labs (all labs ordered are listed, but only abnormal results are displayed) Labs Reviewed - No data to display  EKG    Radiology No results found.  Procedures Procedures (including critical care time)  Medications Ordered in ED Medications  hydrOXYzine (ATARAX/VISTARIL) tablet 50 mg (50 mg Oral Given 02/07/19 0802)     Initial Impression / Assessment and Plan / ED Course  I have reviewed the triage vital signs and the nursing notes.  Pertinent labs & imaging results that were available during my care of the patient were reviewed by me and considered in my medical decision making (see chart for details).        Patient be treated with Keflex along with medication for the irritation.  I have advised patient she will need of the braids removed and wash her hair very well after this.  Feel that this is an allergic reaction to either the false hair or some of the chemicals that were used.  I think there may be secondary infection that is developed somewhat.  Final Clinical Impressions(s) / ED Diagnoses   Final diagnoses:  None    ED Discharge Orders    None       Dalia Heading, PA-C 02/07/19 0816    Maudie Flakes, MD 02/08/19 [id number]

## 2019-02-07 NOTE — Discharge Instructions (Signed)
Return here as needed.  Follow-up with your doctor.  I would also advise getting the braids removed as soon as possible.

## 2019-02-07 NOTE — ED Notes (Signed)
Bed: WA02 Expected date:  Expected time:  Means of arrival:  Comments: 

## 2019-02-07 NOTE — ED Triage Notes (Signed)
Pt got her hair done Wednesday of last week and returned after a few days with severe itching. Hairdresser applied some spray and she states it made the itching worse. Pt arrived in distress scratching at her head and yelling in triage.

## 2019-02-07 NOTE — ED Notes (Signed)
Bed: WLPT3 Expected date:  Expected time:  Means of arrival:  Comments: 

## 2019-03-03 ENCOUNTER — Other Ambulatory Visit: Payer: Self-pay

## 2019-03-03 ENCOUNTER — Emergency Department (HOSPITAL_COMMUNITY): Payer: Medicaid Other

## 2019-03-03 ENCOUNTER — Encounter (HOSPITAL_COMMUNITY): Payer: Self-pay | Admitting: Emergency Medicine

## 2019-03-03 ENCOUNTER — Emergency Department (HOSPITAL_COMMUNITY)
Admission: EM | Admit: 2019-03-03 | Discharge: 2019-03-03 | Disposition: A | Discharge status: Home / Self Care | Payer: Medicaid Other | Attending: Emergency Medicine | Admitting: Emergency Medicine

## 2019-03-03 DIAGNOSIS — R05 Cough: Secondary | ICD-10-CM | POA: Diagnosis not present

## 2019-03-03 DIAGNOSIS — Z20822 Contact with and (suspected) exposure to covid-19: Secondary | ICD-10-CM

## 2019-03-03 DIAGNOSIS — Z7984 Long term (current) use of oral hypoglycemic drugs: Secondary | ICD-10-CM | POA: Diagnosis not present

## 2019-03-03 DIAGNOSIS — R079 Chest pain, unspecified: Secondary | ICD-10-CM | POA: Diagnosis not present

## 2019-03-03 DIAGNOSIS — E119 Type 2 diabetes mellitus without complications: Secondary | ICD-10-CM | POA: Insufficient documentation

## 2019-03-03 DIAGNOSIS — Z79899 Other long term (current) drug therapy: Secondary | ICD-10-CM | POA: Diagnosis not present

## 2019-03-03 DIAGNOSIS — R0789 Other chest pain: Secondary | ICD-10-CM | POA: Diagnosis not present

## 2019-03-03 DIAGNOSIS — Z21 Asymptomatic human immunodeficiency virus [HIV] infection status: Secondary | ICD-10-CM | POA: Diagnosis not present

## 2019-03-03 DIAGNOSIS — Z20828 Contact with and (suspected) exposure to other viral communicable diseases: Secondary | ICD-10-CM | POA: Diagnosis not present

## 2019-03-03 DIAGNOSIS — R0981 Nasal congestion: Secondary | ICD-10-CM | POA: Diagnosis not present

## 2019-03-03 MED ORDER — BENZONATATE 100 MG PO CAPS: 100.0000 mg | ORAL_CAPSULE | Freq: Three times a day (TID) | ORAL | 0 refills | Status: DC

## 2019-03-03 MED ORDER — LIDOCAINE VISCOUS HCL 2 % MT SOLN: 15.0000 mL | OROMUCOSAL | 0 refills | Status: DC | PRN

## 2019-03-03 NOTE — ED Provider Notes (Signed)
[facility] DEPT Provider Note   CSN: [id number] Arrival date & time: 03/03/19  1215     History   Chief Complaint Chief Complaint  Patient presents with  . Cough    HPI Beverly Johnson is a 43 y.o. female.     The history is provided by the patient. No language interpreter was used.  Cough    43 year old female with history of diabetes, and HIV, presenting for evaluation of a cough.  Patient report for the past 2 days that she has had a nonproductive cough, achy sensation about her chest and body, mild congestion, and not feeling well.  She does not complain of any fever chills no sense of loss of taste or smell, no nausea vomiting or diarrhea and no shortness of breath.  She denies any recent sick contact with anyone with COVID-19.  She denies any specific treatment tried at home.  She reported that her HIV is well controlled.  She is here at the urging of her husband for testing.  No recent travel.  No other complaint.  Past Medical History:  Diagnosis Date  . Diabetes mellitus without complication ([place])   . Gallstones 03/10/2011  . HIV infection ([place])   . Immune deficiency disorder [facility])     Patient Active Problem List   Diagnosis Date Noted  . Medication monitoring encounter 11/12/2017  . Breast nodule 09/02/2017  . Screening examination for venereal disease 03/30/2017  . Encounter for long-term (current) use of high-risk medication 03/30/2017  . Microalbuminuria due to type 2 diabetes mellitus ([place]) 03/30/2017  . Knee pain, chronic 03/30/2017  . Vitamin D deficiency 03/21/2017  . Bilateral leg cramps 01/22/2017  . Healthcare maintenance 03/02/2014  . Morbid obesity with BMI of 45.0-49.9, adult ([place]) 04/27/2012  . Uncontrolled type 2 diabetes mellitus ([place]) 12/08/2010  . Human immunodeficiency virus (HIV) disease ([place]) 09/29/2006    Past Surgical History:  Procedure Laterality Date  . CHOLECYSTECTOMY  06/29/2011   Procedure:  LAPAROSCOPIC CHOLECYSTECTOMY WITH INTRAOPERATIVE CHOLANGIOGRAM;  Surgeon: Merrie Roof, MD;  Location: [facility];  Service: General;  Laterality: N/A;  . NO PAST SURGERIES       OB History   No obstetric history on file.      Home Medications    Prior to Admission medications   Medication Sig Start Date End Date Taking? Authorizing Provider  GENVOYA 150-150-200-10 MG TABS tablet TAKE 1 TABLET BY MOUTH DAILY WITH BREAKFAST. Patient taking differently: Take 1 tablet by mouth daily with breakfast.  09/02/18  Yes Comer, Okey Regal, MD  hydrOXYzine (ATARAX/VISTARIL) 25 MG tablet Take 1 tablet (25 mg total) by mouth every 4 (four) hours as needed. Patient taking differently: Take 25 mg by mouth every 4 (four) hours as needed for itching.  02/07/19  Yes Lawyer, Harrell Gave, PA-C  sitaGLIPtin (JANUVIA) 100 MG tablet Take 1 tablet (100 mg total) by mouth daily. 08/05/17  Yes Neva Seat, MD  glucose blood [facility] ACTIVE STRIPS) test strip Use as instructed 05/03/18   Katy Fitch, MD  metFORMIN (GLUCOPHAGE) 500 MG tablet Take 2 tablets (1,000 mg total) by mouth 2 (two) times daily with a meal. 03/22/17 09/08/26  Ledell Noss, MD    Family History No family history on file.  Social History Social History   Tobacco Use  . Smoking status: Never Smoker  . Smokeless tobacco: Never Used  Substance Use Topics  . Alcohol use: No  . Drug use: No  Allergies   Patient has no known allergies.   Review of Systems Review of Systems  Respiratory: Positive for cough.   All other systems reviewed and are negative.    Physical Exam Updated Vital Signs BP 116/76 (BP Location: Right Arm)   Pulse 95   Temp 99 F (37.2 C) (Oral)   Resp 18   SpO2 99%   Physical Exam Vitals signs and nursing note reviewed.  Constitutional:      General: She is not in acute distress.    Appearance: She is well-developed. She is obese.  HENT:     Head: Atraumatic.  Eyes:     Conjunctiva/sclera:  Conjunctivae normal.  Neck:     Musculoskeletal: Neck supple.  Cardiovascular:     Rate and Rhythm: Normal rate and regular rhythm.     Pulses: Normal pulses.     Heart sounds: Normal heart sounds.  Pulmonary:     Effort: Pulmonary effort is normal.     Breath sounds: Normal breath sounds. No wheezing, rhonchi or rales.  Abdominal:     Palpations: Abdomen is soft.     Tenderness: There is no abdominal tenderness.  Skin:    Findings: No rash.  Neurological:     Mental Status: She is alert and oriented to person, place, and time.  Psychiatric:        Mood and Affect: Mood normal.      ED Treatments / Results  Labs (all labs ordered are listed, but only abnormal results are displayed) Labs Reviewed  NOVEL CORONAVIRUS, NAA (HOSPITAL ORDER, SEND-OUT TO REF LAB)    EKG None  Radiology Dg Chest Portable 1 View  Result Date: 03/03/2019 CLINICAL DATA:  Nonproductive cough and chest pain for the past 2 days. EXAM: PORTABLE CHEST 1 VIEW COMPARISON:  10/28/2018. FINDINGS: Poor inspiration. Normal sized heart. Clear lungs with normal vascularity. Normal appearing bones. IMPRESSION: Normal examination. Electronically Signed   By: Claudie Revering M.D.   On: 03/03/2019 13:46    Procedures Procedures (including critical care time)  Medications Ordered in ED Medications - No data to display   Initial Impression / Assessment and Plan / ED Course  I have reviewed the triage vital signs and the nursing notes.  Pertinent labs & imaging results that were available during my care of the patient were reviewed by me and considered in my medical decision making (see chart for details).        BP 116/76 (BP Location: Right Arm)   Pulse 95   Temp 99 F (37.2 C) (Oral)   Resp 18   LMP 02/28/2019   SpO2 99%    Final Clinical Impressions(s) / ED Diagnoses   Final diagnoses:  Suspected Covid-19 Virus Infection    ED Discharge Orders         Ordered    benzonatate (TESSALON) 100 MG  capsule  Every 8 hours     03/03/19 1433    lidocaine (XYLOCAINE) 2 % solution  As needed     03/03/19 1433         1:50 PM Patient here with cough congestion and body aches suggestive of potential COVID-19 exposure or sickness.  She does have history of immunocompromise including HIV and diabetes.  Patient otherwise well-appearing.  No hypoxia.  COVID-19 test obtained in the ED, chest x-ray unremarkable.  Patient discharged home with appropriate instruction work note provided.  Beverly Johnson was evaluated in Emergency Department on 03/03/2019 for the symptoms described in  the history of present illness. She was evaluated in the context of the global COVID-19 pandemic, which necessitated consideration that the patient might be at risk for infection with the SARS-CoV-2 virus that causes COVID-19. Institutional protocols and algorithms that pertain to the evaluation of patients at risk for COVID-19 are in a state of rapid change based on information released by regulatory bodies including the CDC and federal and state organizations. These policies and algorithms were followed during the patient's care in the ED.    Domenic Moras, PA-C 03/03/19 1434    Daleen Bo, MD 03/03/19 1745

## 2019-03-03 NOTE — ED Triage Notes (Signed)
Patient c/o nonproductive cough and "aching chest" x2 days. Denies N/V/D.

## 2019-03-04 LAB — NOVEL CORONAVIRUS, NAA (HOSP ORDER, SEND-OUT TO REF LAB; TAT 18-24 HRS): SARS-CoV-2, NAA: NOT DETECTED

## 2019-03-06 ENCOUNTER — Telehealth: Payer: Self-pay

## 2019-03-06 NOTE — Telephone Encounter (Signed)
Patient calling about COVID results from the emergency room.  She is concerned about being at home with her child , her head is aching, productive cough and states she doesn't feel well.   She feels she is feeling worse.   She does not have a thermometer so not tempt taken.  She states a note was given to her to be out of work for 10 days.   Laverle Patter, RN

## 2019-03-06 NOTE — Telephone Encounter (Signed)
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

## 2019-03-08 ENCOUNTER — Emergency Department (HOSPITAL_COMMUNITY)
Admission: EM | Admit: 2019-03-08 | Discharge: 2019-03-08 | Discharge status: Absent without leave | Payer: Medicaid Other

## 2019-03-09 ENCOUNTER — Telehealth: Payer: Self-pay

## 2019-03-09 NOTE — Telephone Encounter (Signed)
Patient called office today requesting copy of Covid-19 test done at [facility]. Also requesting a letter to return to work. 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 Patient states that [employer] record department is currently closed. Patient also states that her employer will not take hospital work note since it is missing exact return to work date. Patient is having issues contacting someone at [facility] long regarding note, and would like to know if Dr. Tommy Medal would be able to write a letter for patient to return to work with an exact date. Will forward message to MD to advise on letter. [place]

## 2019-03-13 ENCOUNTER — Encounter: Payer: Self-pay | Admitting: *Deleted

## 2019-03-13 NOTE — Telephone Encounter (Signed)
Called patient to advise letter is ready for pick up and she advised to fax to her employer [employer] at [phone]. Advised her fax is on the way.

## 2019-05-16 ENCOUNTER — Emergency Department (HOSPITAL_COMMUNITY)
Admission: EM | Admit: 2019-05-16 | Discharge: 2019-05-16 | Disposition: A | Discharge status: Home / Self Care | Payer: Medicaid Other | Attending: Emergency Medicine | Admitting: Emergency Medicine

## 2019-05-16 ENCOUNTER — Encounter (HOSPITAL_COMMUNITY): Payer: Self-pay | Admitting: Emergency Medicine

## 2019-05-16 ENCOUNTER — Telehealth: Payer: Self-pay

## 2019-05-16 ENCOUNTER — Other Ambulatory Visit: Payer: Self-pay

## 2019-05-16 DIAGNOSIS — E119 Type 2 diabetes mellitus without complications: Secondary | ICD-10-CM | POA: Diagnosis not present

## 2019-05-16 DIAGNOSIS — M79671 Pain in right foot: Secondary | ICD-10-CM | POA: Diagnosis present

## 2019-05-16 DIAGNOSIS — Z7984 Long term (current) use of oral hypoglycemic drugs: Secondary | ICD-10-CM | POA: Diagnosis not present

## 2019-05-16 DIAGNOSIS — Z21 Asymptomatic human immunodeficiency virus [HIV] infection status: Secondary | ICD-10-CM | POA: Insufficient documentation

## 2019-05-16 DIAGNOSIS — Z79899 Other long term (current) drug therapy: Secondary | ICD-10-CM | POA: Insufficient documentation

## 2019-05-16 DIAGNOSIS — B070 Plantar wart: Secondary | ICD-10-CM | POA: Insufficient documentation

## 2019-05-16 NOTE — ED Triage Notes (Signed)
Pt c/o R foot pain for years, more irritating today.

## 2019-05-16 NOTE — Discharge Instructions (Signed)
Please read attached information. If you experience any new or worsening signs or symptoms please return to the emergency room for evaluation. Please follow-up with your primary care provider or specialist as discussed.  °

## 2019-05-16 NOTE — ED Provider Notes (Signed)
[facility] EMERGENCY DEPARTMENT Provider Note   CSN: [id number] Arrival date & time: 05/16/19  1617     History   Chief Complaint Chief Complaint  Patient presents with  . Foot Pain    HPI Beverly Johnson is a 43 y.o. female.     HPI   43 year old female presents today with foot pain.  Patient notes several history of intermittent plantar foot pain.  She notes this is worsened recently.  She notes this is up by her toes.  She denies any trauma to her foot, notes that standing prolonged periods of time make her symptoms worse.    Past Medical History:  Diagnosis Date  . Diabetes mellitus without complication ([place])   . Gallstones 03/10/2011  . HIV infection ([place])   . Immune deficiency disorder [facility])     Patient Active Problem List   Diagnosis Date Noted  . Medication monitoring encounter 11/12/2017  . Breast nodule 09/02/2017  . Screening examination for venereal disease 03/30/2017  . Encounter for long-term (current) use of high-risk medication 03/30/2017  . Microalbuminuria due to type 2 diabetes mellitus ([place]) 03/30/2017  . Knee pain, chronic 03/30/2017  . Vitamin D deficiency 03/21/2017  . Bilateral leg cramps 01/22/2017  . Healthcare maintenance 03/02/2014  . Morbid obesity with BMI of 45.0-49.9, adult ([place]) 04/27/2012  . Uncontrolled type 2 diabetes mellitus ([place]) 12/08/2010  . Human immunodeficiency virus (HIV) disease ([place]) 09/29/2006    Past Surgical History:  Procedure Laterality Date  . CHOLECYSTECTOMY  06/29/2011   Procedure: LAPAROSCOPIC CHOLECYSTECTOMY WITH INTRAOPERATIVE CHOLANGIOGRAM;  Surgeon: Merrie Roof, MD;  Location: [facility];  Service: General;  Laterality: N/A;  . NO PAST SURGERIES       OB History   No obstetric history on file.      Home Medications    Prior to Admission medications   Medication Sig Start Date End Date Taking? Authorizing Provider  benzonatate (TESSALON) 100 MG capsule Take 1 capsule (100 mg  total) by mouth every 8 (eight) hours. 03/03/19   Domenic Moras, PA-C  GENVOYA 150-150-200-10 MG TABS tablet TAKE 1 TABLET BY MOUTH DAILY WITH BREAKFAST. Patient taking differently: Take 1 tablet by mouth daily with breakfast.  09/02/18   Comer, Okey Regal, MD  glucose blood (ACCU-CHEK ACTIVE STRIPS) test strip Use as instructed 05/03/18   Katy Fitch, MD  hydrOXYzine (ATARAX/VISTARIL) 25 MG tablet Take 1 tablet (25 mg total) by mouth every 4 (four) hours as needed. Patient taking differently: Take 25 mg by mouth every 4 (four) hours as needed for itching.  02/07/19   Lawyer, Harrell Gave, PA-C  lidocaine (XYLOCAINE) 2 % solution Use as directed 15 mLs in the mouth or throat as needed for mouth pain. 03/03/19   Domenic Moras, PA-C  metFORMIN (GLUCOPHAGE) 500 MG tablet Take 2 tablets (1,000 mg total) by mouth 2 (two) times daily with a meal. 03/22/17 09/08/26  Ledell Noss, MD  sitaGLIPtin (JANUVIA) 100 MG tablet Take 1 tablet (100 mg total) by mouth daily. 08/05/17   Neva Seat, MD    Family History No family history on file.  Social History Social History   Tobacco Use  . Smoking status: Never Smoker  . Smokeless tobacco: Never Used  Substance Use Topics  . Alcohol use: No  . Drug use: No     Allergies   Patient has no known allergies.   Review of Systems Review of Systems  All other systems reviewed and are negative.  Physical Exam Updated Vital Signs BP 127/89   Pulse 98   Temp 98.5 F (36.9 C)   Resp 18   Ht 5\' 7"  (1.702 m)   Wt 117 kg   LMP 05/12/2019   SpO2 98%   BMI 40.40 kg/m   Physical Exam Vitals signs and nursing note reviewed.  Constitutional:      Appearance: She is well-developed.  HENT:     Head: Normocephalic and atraumatic.  Eyes:     General: No scleral icterus.       Right eye: No discharge.        Left eye: No discharge.     Conjunctiva/sclera: Conjunctivae normal.     Pupils: Pupils are equal, round, and reactive to light.  Neck:      Musculoskeletal: Normal range of motion.     Vascular: No JVD.     Trachea: No tracheal deviation.  Pulmonary:     Effort: Pulmonary effort is normal.     Breath sounds: No stridor.  Musculoskeletal:     Comments: Plantar wart noted to plantar midfoot right  Neurological:     Mental Status: She is alert and oriented to person, place, and time.     Coordination: Coordination normal.  Psychiatric:        Behavior: Behavior normal.        Thought Content: Thought content normal.        Judgment: Judgment normal.      ED Treatments / Results  Labs (all labs ordered are listed, but only abnormal results are displayed) Labs Reviewed - No data to display  EKG None  Radiology No results found.  Procedures Procedures (including critical care time)  Medications Ordered in ED Medications - No data to display   Initial Impression / Assessment and Plan / ED Course  I have reviewed the triage vital signs and the nursing notes.  Pertinent labs & imaging results that were available during my care of the patient were reviewed by me and considered in my medical decision making (see chart for details).        Labs:   Imaging:  Consults:  Therapeutics:  Discharge Meds:   Assessment/Plan: 43 year old female presents today with foot pain.  Likely plantar warts under very epidermis.  Patient referred to podiatry return precautions given.  Verbalized understanding and agreement to today's plan.      Final Clinical Impressions(s) / ED Diagnoses   Final diagnoses:  Plantar wart    ED Discharge Orders    None       Francee Gentile 05/16/19 2202    Carmin Muskrat, MD 05/17/19 [phone]

## 2019-05-16 NOTE — ED Notes (Signed)
Discharge instructions reviewed with patient, verbalized understanding.

## 2019-05-16 NOTE — Telephone Encounter (Signed)
Patient called office complaining of ongoing foot pain. States that pain is sharp and throbbing. Radiates from ankle down to toes. has not tried taking any tylenol/ ibuprofen to help ease pain. States that her foot is effecting her work, and might have to leave work until foot pain is resolved. Patient states in call she feels like her foot must get cut off so pain resolves.  Patient does not have a PCP and is not looking for one at this moment.  Advised patient if she feels like her foot needs to be examined or is causing extreme discomfort to go to urgent care for eval. Patient verbalized understanding and will do as directed. [place]

## 2019-05-24 ENCOUNTER — Encounter: Payer: Self-pay | Admitting: Podiatry

## 2019-05-24 ENCOUNTER — Ambulatory Visit (INDEPENDENT_AMBULATORY_CARE_PROVIDER_SITE_OTHER): Payer: Medicaid Other | Admitting: Podiatry

## 2019-05-24 ENCOUNTER — Other Ambulatory Visit: Payer: Self-pay

## 2019-05-24 VITALS — BP 114/69

## 2019-05-24 DIAGNOSIS — M79675 Pain in left toe(s): Secondary | ICD-10-CM | POA: Diagnosis not present

## 2019-05-24 DIAGNOSIS — Q828 Other specified congenital malformations of skin: Secondary | ICD-10-CM

## 2019-05-24 DIAGNOSIS — B351 Tinea unguium: Secondary | ICD-10-CM

## 2019-05-24 DIAGNOSIS — M79674 Pain in right toe(s): Secondary | ICD-10-CM

## 2019-05-24 DIAGNOSIS — M79671 Pain in right foot: Secondary | ICD-10-CM

## 2019-05-24 NOTE — Progress Notes (Signed)
  Subjective:  Patient ID: Beverly Johnson, female    DOB: 09-19-1975,  MRN: [id number]  Chief Complaint  Patient presents with  . Plantar Warts    pt is here for a possible plantar wart of the right foot, pt states that pain has been going on for a couple of years   43 y.o. female returns for the above complaint.  She states she does not know if it is a wart or callus secondary to pressure induced.  She has pain referred while ambulating on the callus.  The pain has become gradually over time.  Pain is elevated when she is walking or putting pressure on it.  This pain for couple of years.  She denies any nausea fever chills vomiting shortness of breath.  She also has secondary complaints of mycotic and thickened elongated painful toenails x10.  Objective:   Vitals:   05/24/19 0930  BP: 114/69   Podiatric Exam: Vascular: dorsalis pedis and posterior tibial pulses are palpable bilateral. Capillary return is immediate. Temperature gradient is WNL. Skin turgor WNL  Sensorium: Normal Semmes Weinstein monofilament test. Normal tactile sensation bilaterally. Nail Exam: Pt has thick disfigured discolored nails with subungual debris noted bilateral entire nail hallux through fifth toenails Ulcer Exam: There is no evidence of ulcer or pre-ulcerative changes or infection. Orthopedic Exam: Muscle tone and strength are WNL. No limitations in general ROM. No crepitus or effusions noted. HAV  B/L.  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 Skin: No Porokeratosis. No infection or ulcers  Assessment & Plan:  Patient was evaluated and treated and all questions answered.  Porokeratosis (hyperkeratotic lesion sub-met 4) -The lesion was adequately debrided down to healthy tissue no pinpoint bleeding noted ruling out wart. -Metatarsal pad was asked to offload the forefoot.  Onychomycosis with pain  -Nails palliatively debrided as below. -Educated on self-care  Procedure: Nail Debridement Rationale: pain  Type of  Debridement: manual, sharp debridement. Instrumentation: Nail nipper, rotary burr. Number of Nails: 10  Procedures and Treatment: Consent by patient was obtained for treatment procedures. The patient understood the discussion of treatment and procedures well. All questions were answered thoroughly reviewed. Debridement of mycotic and hypertrophic toenails, 1 through 5 bilateral and clearing of subungual debris. No ulceration, no infection noted.  Return Visit-Office Procedure: Patient instructed to return to the office for a follow up visit 3 months for continued evaluation and treatment.  Boneta Lucks, DPM    No follow-ups on file.

## 2019-05-26 ENCOUNTER — Encounter (HOSPITAL_COMMUNITY): Payer: Self-pay | Admitting: Emergency Medicine

## 2019-05-26 ENCOUNTER — Other Ambulatory Visit: Payer: Self-pay

## 2019-05-26 ENCOUNTER — Emergency Department (HOSPITAL_COMMUNITY)
Admission: EM | Admit: 2019-05-26 | Discharge: 2019-05-26 | Disposition: A | Discharge status: Home / Self Care | Payer: Medicaid Other | Attending: Emergency Medicine | Admitting: Emergency Medicine

## 2019-05-26 ENCOUNTER — Telehealth (HOSPITAL_COMMUNITY): Payer: Self-pay | Admitting: Emergency Medicine

## 2019-05-26 ENCOUNTER — Ambulatory Visit (HOSPITAL_COMMUNITY)
Admission: EM | Admit: 2019-05-26 | Discharge: 2019-05-26 | Disposition: A | Discharge status: Home / Self Care | Payer: Medicaid Other | Attending: Emergency Medicine | Admitting: Emergency Medicine

## 2019-05-26 ENCOUNTER — Emergency Department (HOSPITAL_COMMUNITY): Payer: Medicaid Other

## 2019-05-26 ENCOUNTER — Encounter (HOSPITAL_COMMUNITY): Payer: Self-pay

## 2019-05-26 DIAGNOSIS — J3489 Other specified disorders of nose and nasal sinuses: Secondary | ICD-10-CM | POA: Diagnosis not present

## 2019-05-26 DIAGNOSIS — R5383 Other fatigue: Secondary | ICD-10-CM

## 2019-05-26 DIAGNOSIS — R0602 Shortness of breath: Secondary | ICD-10-CM | POA: Diagnosis not present

## 2019-05-26 DIAGNOSIS — Z20828 Contact with and (suspected) exposure to other viral communicable diseases: Secondary | ICD-10-CM | POA: Diagnosis not present

## 2019-05-26 DIAGNOSIS — Z5321 Procedure and treatment not carried out due to patient leaving prior to being seen by health care provider: Secondary | ICD-10-CM | POA: Diagnosis not present

## 2019-05-26 DIAGNOSIS — R739 Hyperglycemia, unspecified: Secondary | ICD-10-CM | POA: Diagnosis not present

## 2019-05-26 DIAGNOSIS — R0789 Other chest pain: Secondary | ICD-10-CM | POA: Diagnosis present

## 2019-05-26 DIAGNOSIS — R079 Chest pain, unspecified: Secondary | ICD-10-CM | POA: Diagnosis not present

## 2019-05-26 DIAGNOSIS — M7918 Myalgia, other site: Secondary | ICD-10-CM | POA: Diagnosis not present

## 2019-05-26 DIAGNOSIS — R519 Headache, unspecified: Secondary | ICD-10-CM | POA: Diagnosis not present

## 2019-05-26 DIAGNOSIS — M791 Myalgia, unspecified site: Secondary | ICD-10-CM

## 2019-05-26 DIAGNOSIS — R110 Nausea: Secondary | ICD-10-CM

## 2019-05-26 DIAGNOSIS — B20 Human immunodeficiency virus [HIV] disease: Secondary | ICD-10-CM

## 2019-05-26 DIAGNOSIS — Z20822 Contact with and (suspected) exposure to covid-19: Secondary | ICD-10-CM

## 2019-05-26 DIAGNOSIS — R509 Fever, unspecified: Secondary | ICD-10-CM | POA: Insufficient documentation

## 2019-05-26 DIAGNOSIS — R197 Diarrhea, unspecified: Secondary | ICD-10-CM

## 2019-05-26 DIAGNOSIS — R05 Cough: Secondary | ICD-10-CM

## 2019-05-26 DIAGNOSIS — U071 COVID-19: Secondary | ICD-10-CM | POA: Diagnosis not present

## 2019-05-26 LAB — CBC
HCT: 35.4 % — ABNORMAL LOW (ref 36.0–46.0)
Hemoglobin: 11.1 g/dL — ABNORMAL LOW (ref 12.0–15.0)
MCH: 23.9 pg — ABNORMAL LOW (ref 26.0–34.0)
MCHC: 31.4 g/dL (ref 30.0–36.0)
MCV: 76.3 fL — ABNORMAL LOW (ref 80.0–100.0)
Platelets: 146 10*3/uL — ABNORMAL LOW (ref 150–400)
RBC: 4.64 MIL/uL (ref 3.87–5.11)
RDW: 13.9 % (ref 11.5–15.5)
WBC: 2.7 10*3/uL — ABNORMAL LOW (ref 4.0–10.5)
nRBC: 0 % (ref 0.0–0.2)

## 2019-05-26 LAB — POCT URINALYSIS DIP (DEVICE)
Bilirubin Urine: NEGATIVE
Glucose, UA: 500 mg/dL — AB
Hgb urine dipstick: NEGATIVE
Ketones, ur: NEGATIVE mg/dL
Leukocytes,Ua: NEGATIVE
Nitrite: NEGATIVE
Protein, ur: NEGATIVE mg/dL
Specific Gravity, Urine: 1.015 (ref 1.005–1.030)
Urobilinogen, UA: 0.2 mg/dL (ref 0.0–1.0)
pH: 6 (ref 5.0–8.0)

## 2019-05-26 LAB — BASIC METABOLIC PANEL
Anion gap: 11 (ref 5–15)
BUN: 5 mg/dL — ABNORMAL LOW (ref 6–20)
CO2: 22 mmol/L (ref 22–32)
Calcium: 8.5 mg/dL — ABNORMAL LOW (ref 8.9–10.3)
Chloride: 100 mmol/L (ref 98–111)
Creatinine, Ser: 0.6 mg/dL (ref 0.44–1.00)
GFR calc Af Amer: >60 mL/min (ref >60)
GFR calc non Af Amer: >60 mL/min (ref >60)
Glucose, Bld: 254 mg/dL — ABNORMAL HIGH (ref 70–99)
Potassium: 3.7 mmol/L (ref 3.5–5.1)
Sodium: 133 mmol/L — ABNORMAL LOW (ref 135–145)

## 2019-05-26 LAB — GLUCOSE, CAPILLARY: Glucose-Capillary: 401 mg/dL — ABNORMAL HIGH (ref 70–99)

## 2019-05-26 LAB — TROPONIN I (HIGH SENSITIVITY): Troponin I (High Sensitivity): 3 ng/L (ref <18)

## 2019-05-26 LAB — I-STAT BETA HCG BLOOD, ED (MC, WL, AP ONLY): I-stat hCG, quantitative: <5 m[IU]/mL (ref <5)

## 2019-05-26 MED ORDER — FREESTYLE SYSTEM KIT: 1.0000 | PACK | Freq: Every day | 0 refills | Status: DC

## 2019-05-26 MED ORDER — SODIUM CHLORIDE 0.9% FLUSH: 3.0000 mL | Freq: Once | INTRAVENOUS | Status: DC

## 2019-05-26 MED ORDER — BENZONATATE 200 MG PO CAPS: 200.0000 mg | ORAL_CAPSULE | Freq: Three times a day (TID) | ORAL | 0 refills | Status: DC | PRN

## 2019-05-26 MED ORDER — ACETAMINOPHEN 325 MG PO TABS
650.0000 mg | ORAL_TABLET | Freq: Once | ORAL | Status: AC | PRN
  Administered 2019-05-26: 650 mg via ORAL
  Filled 2019-05-26: qty 2

## 2019-05-26 MED ORDER — GLUCOSE BLOOD VI STRP: ORAL_STRIP | 2 refills | Status: AC

## 2019-05-26 MED ORDER — IBUPROFEN 600 MG PO TABS: 600.0000 mg | ORAL_TABLET | Freq: Four times a day (QID) | ORAL | 0 refills | Status: DC | PRN

## 2019-05-26 NOTE — ED Notes (Signed)
CBG results given to Gibson Ramp RN and Dr. Alphonzo Cruise

## 2019-05-26 NOTE — ED Notes (Signed)
Pt stated sh had to leave, will be back tomorrow.

## 2019-05-26 NOTE — Telephone Encounter (Signed)
Pt called stating her insurance would not cover the glucose kit. Called pharmacy, states freestyle machine is NOT covered, however there are several other machines that ARE covered. Pt informed that we do not do authorizations for any type of prescription, she can other call her PCP to get the authorization or select a kit that is covered. Pt hung up on me.

## 2019-05-26 NOTE — ED Triage Notes (Addendum)
Pt reports L sided chest pain that radiates to back with SOB, nausea, chills, fever, and non-productive cough.  States pain started while at work at [place] seen at [facility] this morning with complaints of nausea and generalized body aches.  States she was given Rx but went to pharmacy and unable to get medication because they were charging her for it.  States she was tested for COVID today and results pending.

## 2019-05-26 NOTE — ED Provider Notes (Signed)
HPI  SUBJECTIVE:  Beverly Johnson is a 43 y.o. female who presents with 1 week of cough, frontal headaches, body aches and malaise starting yesterday.  She reports rhinorrhea, nausea, wheezing, chest soreness secondary to the cough.  She reports intermittent watery nonbloody diarrhea for the past week, but she works at [employer] and tends to eat at work.  She reports water brash.  States that she is coughing all night long.  No fevers, nasal congestion, postnasal drip, sore throat, vomiting, shortness of breath, abdominal pain, loss of sense of smell or taste.  No known COVID exposure.  She tried Alka-Seltzer yesterday, Tylenol thousand milligrams ibuprofen 400 mg, cough medicine and NyQuil without improvement in her symptoms.  Symptoms are worse with coughing.  She denies photophobia, neck stiffness, rash.  No belching.  No urinary complaints.  She has a past medical history of diabetes/states that she is compliant with her medications, does not have a meter at home does not check her sugars. Wonders is her glucose is elevated. HIV: Viral load unknown, CD4 unknown, but she states that it is stable.  She is status post cholecystectomy, has a history of UTI.  No history of asthma, emphysema, COPD, smoking, GERD, chronic kidney disease, DKA, pancreatitis.  LMP: 2 weeks ago.  Denies the possibility of being pregnant.  PMD: Dr. De Burrs.  She has an appointment with him on 10/14.    Past Medical History:  Diagnosis Date  . Diabetes mellitus without complication ([place])   . Gallstones 03/10/2011  . HIV infection ([place])   . Immune deficiency disorder [facility])     Past Surgical History:  Procedure Laterality Date  . CHOLECYSTECTOMY  06/29/2011   Procedure: LAPAROSCOPIC CHOLECYSTECTOMY WITH INTRAOPERATIVE CHOLANGIOGRAM;  Surgeon: Merrie Roof, MD;  Location: [place];  Service: General;  Laterality: N/A;  . NO PAST SURGERIES      Family History  Problem Relation Age of Onset  . Healthy Mother   . Healthy  Father     Social History   Tobacco Use  . Smoking status: Never Smoker  . Smokeless tobacco: Never Used  Substance Use Topics  . Alcohol use: No  . Drug use: No    No current facility-administered medications for this encounter.   Current Outpatient Medications:  .  benzonatate (TESSALON) 200 MG capsule, Take 1 capsule (200 mg total) by mouth 3 (three) times daily as needed for cough., Disp: 30 capsule, Rfl: 0 .  GENVOYA 150-150-200-10 MG TABS tablet, TAKE 1 TABLET BY MOUTH DAILY WITH BREAKFAST. (Patient taking differently: Take 1 tablet by mouth daily with breakfast. ), Disp: 30 tablet, Rfl: 3 .  glucose blood (ACCU-CHEK ACTIVE STRIPS) test strip, Use as instructed, Disp: 100 each, Rfl: 12 .  glucose blood test strip, Check your sugar in the morning before you eat breakfast, and one hour after a meal., Disp: 100 each, Rfl: 2 .  glucose monitoring kit (FREESTYLE) monitoring kit, 1 each by Does not apply route daily. Check glucose once in the morning before breakfast and 1 hour after a meal, Disp: 1 each, Rfl: 0 .  hydrOXYzine (ATARAX/VISTARIL) 25 MG tablet, Take 1 tablet (25 mg total) by mouth every 4 (four) hours as needed. (Patient taking differently: Take 25 mg by mouth every 4 (four) hours as needed for itching. ), Disp: 30 tablet, Rfl: 0 .  ibuprofen (ADVIL) 600 MG tablet, Take 1 tablet (600 mg total) by mouth every 6 (six) hours as needed., Disp: 30 tablet, Rfl:  0 .  lidocaine (XYLOCAINE) 2 % solution, Use as directed 15 mLs in the mouth or throat as needed for mouth pain., Disp: 150 mL, Rfl: 0 .  metFORMIN (GLUCOPHAGE) 500 MG tablet, Take 2 tablets (1,000 mg total) by mouth 2 (two) times daily with a meal., Disp: 120 tablet, Rfl: 11 .  sitaGLIPtin (JANUVIA) 100 MG tablet, Take 1 tablet (100 mg total) by mouth daily., Disp: 30 tablet, Rfl: 11  No Known Allergies   ROS  As noted in HPI.   Physical Exam  BP 115/84 (BP Location: Right Arm)   Pulse 98   Temp 98.1 F (36.7 C)  (Temporal)   Resp 17   LMP 05/12/2019   SpO2 98%   Constitutional: Well developed, well nourished, no acute distress Eyes: PERRL, EOMI, conjunctiva normal bilaterally.  No photophobia. HENT: Normocephalic, atraumatic,mucus membranes moist.  No nasal congestion.  No frontal or maxillary sinus tenderness. Neck: No cervical lymphadenopathy, meningismus. Respiratory: Clear to auscultation bilaterally, no rales, no wheezing, no rhonchi Cardiovascular: Normal rate and rhythm, no murmurs, no gallops, no rubs GI: Soft, nondistended, normal bowel sounds, diffuse mild tenderness, no rebound, no guarding.  Negative McBurney. Back: no CVAT skin: No rash, skin intact Musculoskeletal: No edema, no tenderness, no deformities Neurologic: Alert & oriented x 3, CN III-XII grossly intact, no motor deficits, sensation grossly intact Psychiatric: Speech and behavior appropriate   ED Course   Medications - No data to display  Orders Placed This Encounter  Procedures  . Novel Coronavirus, NAA (Hosp order, Send-out to Ref Lab; TAT 18-24 hrs    Standing Status:   Standing    Number of Occurrences:   1    Order Specific Question:   Is this test for diagnosis or screening    Answer:   Screening    Order Specific Question:   Symptomatic for COVID-19 as defined by CDC    Answer:   No    Order Specific Question:   Hospitalized for COVID-19    Answer:   No    Order Specific Question:   Admitted to ICU for COVID-19    Answer:   No    Order Specific Question:   Previously tested for COVID-19    Answer:   Yes    Order Specific Question:   Resident in a congregate (group) care setting    Answer:   No    Order Specific Question:   Employed in healthcare setting    Answer:   No    Order Specific Question:   Pregnant    Answer:   No  . Glucose, capillary    Standing Status:   Standing    Number of Occurrences:   1  . POC CBG monitoring    Standing Status:   Standing    Number of Occurrences:   1  . POCT  urinalysis dip (device)    Standing Status:   Standing    Number of Occurrences:   1   Results for orders placed or performed during the hospital encounter of 05/26/19 (from the past 24 hour(s))  POCT urinalysis dip (device)     Status: Abnormal   Collection Time: 05/26/19 11:37 AM  Result Value Ref Range   Glucose, UA 500 (A) NEGATIVE mg/dL   Bilirubin Urine NEGATIVE NEGATIVE   Ketones, ur NEGATIVE NEGATIVE mg/dL   Specific Gravity, Urine 1.015 1.005 - 1.030   Hgb urine dipstick NEGATIVE NEGATIVE   pH 6.0 5.0 - 8.0  Protein, ur NEGATIVE NEGATIVE mg/dL   Urobilinogen, UA 0.2 0.0 - 1.0 mg/dL   Nitrite NEGATIVE NEGATIVE   Leukocytes,Ua NEGATIVE NEGATIVE  Glucose, capillary     Status: Abnormal   Collection Time: 05/26/19 11:40 AM  Result Value Ref Range   Glucose-Capillary 401 (H) 70 - 99 mg/dL   Comment 1 Notify RN    Comment 2 Document in Chart    No results found.  ED Clinical Impression  1. Hyperglycemia   2. Suspected COVID-19 virus infection      ED Assessment/Plan  We will check fingerstick as she does not have a glucose meter at home and has vague symptoms including malaise, and a urine because she has diffuse abdominal tenderness.  She has no CVA tenderness.  COVID test sent.  [place] Narcotic database reviewed for this patient, and feel that the risk/benefit ratio today is favorable for proceeding with a prescription for controlled substance.  No opiate prescriptions in 2 years.  Urine negative for ketones, UTI, she has glucosuria.  Her fingerstick is 401.  Concern for hyperosmolar state.  Transferring to the emergency department for IV fluids, Further work-up and treatment.  Writing prescription for glucose meter and strips.  Patient declining to go to the ER.  states that she just ate, and her sugar always goes at those levels immediately after eating.  States that she is going to go home, push fluids and take her medication.  She states that her sugar will go down in  several hours.  She will keep a log of her glucose, and she will go to the emergency department if it is above 300 tomorrow.  Discussed with her that I was concerned about her abdominal tenderness, but she will keep an eye on this.  Will send home with ibuprofen 600 mg combined with 1 g of Tylenol.  Also sent home with Tessalon for the cough.  Work note for 2 days.  Discussed labs, MDM, treatment plan, and gave her strict ER return precautions.  Patient agrees with plan.  Meds ordered this encounter  Medications  . glucose blood test strip    Sig: Check your sugar in the morning before you eat breakfast, and one hour after a meal.    Dispense:  100 each    Refill:  2  . glucose monitoring kit (FREESTYLE) monitoring kit    Sig: 1 each by Does not apply route daily. Check glucose once in the morning before breakfast and 1 hour after a meal    Dispense:  1 each    Refill:  0  . benzonatate (TESSALON) 200 MG capsule    Sig: Take 1 capsule (200 mg total) by mouth 3 (three) times daily as needed for cough.    Dispense:  30 capsule    Refill:  0  . ibuprofen (ADVIL) 600 MG tablet    Sig: Take 1 tablet (600 mg total) by mouth every 6 (six) hours as needed.    Dispense:  30 tablet    Refill:  0    *This clinic note was created using [employer]. Therefore, there may be occasional mistakes despite careful proofreading.  ?   Melynda Ripple, MD 05/27/19 [phone]

## 2019-05-26 NOTE — Discharge Instructions (Addendum)
I am sending you home with a glucose meter and test strips so you can monitor your sugar at home.  If your sugar stays high after several hours despite taking the medication go to the ER for IV fluids and further work-up.  If it is still high tomorrow, go to the ER.  Take 600 mg of ibuprofen combined with 1 g of Tylenol 3-4 times a day as needed for pain, headache.  Go immediately to the ER if your headache or abdominal pain gets worse.   Tessalon is for the cough.

## 2019-05-26 NOTE — ED Triage Notes (Signed)
Patient presents to Urgent Care with complaints of nausea and generalized body aches since this morning while at work. Patient reports she needs a note to be out of work.

## 2019-05-27 ENCOUNTER — Emergency Department (HOSPITAL_COMMUNITY)
Admission: EM | Admit: 2019-05-27 | Discharge: 2019-05-27 | Disposition: A | Discharge status: Home / Self Care | Payer: Medicaid Other | Attending: Emergency Medicine | Admitting: Emergency Medicine

## 2019-05-27 ENCOUNTER — Encounter (HOSPITAL_COMMUNITY): Payer: Self-pay | Admitting: Emergency Medicine

## 2019-05-27 ENCOUNTER — Emergency Department (HOSPITAL_COMMUNITY): Payer: Medicaid Other

## 2019-05-27 ENCOUNTER — Other Ambulatory Visit: Payer: Self-pay

## 2019-05-27 DIAGNOSIS — E119 Type 2 diabetes mellitus without complications: Secondary | ICD-10-CM | POA: Insufficient documentation

## 2019-05-27 DIAGNOSIS — Z79899 Other long term (current) drug therapy: Secondary | ICD-10-CM | POA: Insufficient documentation

## 2019-05-27 DIAGNOSIS — Z7984 Long term (current) use of oral hypoglycemic drugs: Secondary | ICD-10-CM | POA: Diagnosis not present

## 2019-05-27 DIAGNOSIS — R509 Fever, unspecified: Secondary | ICD-10-CM

## 2019-05-27 DIAGNOSIS — B20 Human immunodeficiency virus [HIV] disease: Secondary | ICD-10-CM | POA: Diagnosis not present

## 2019-05-27 DIAGNOSIS — R079 Chest pain, unspecified: Secondary | ICD-10-CM | POA: Diagnosis not present

## 2019-05-27 DIAGNOSIS — R072 Precordial pain: Secondary | ICD-10-CM | POA: Diagnosis not present

## 2019-05-27 DIAGNOSIS — R0789 Other chest pain: Secondary | ICD-10-CM

## 2019-05-27 LAB — TROPONIN I (HIGH SENSITIVITY): Troponin I (High Sensitivity): 3 ng/L (ref <18)

## 2019-05-27 LAB — LIPASE, BLOOD: Lipase: 45 U/L (ref 11–51)

## 2019-05-27 LAB — CBC WITH DIFFERENTIAL/PLATELET
Abs Immature Granulocytes: 0.01 10*3/uL (ref 0.00–0.07)
Basophils Absolute: 0 10*3/uL (ref 0.0–0.1)
Basophils Relative: 0 %
Eosinophils Absolute: 0 10*3/uL (ref 0.0–0.5)
Eosinophils Relative: 0 %
HCT: 37.6 % (ref 36.0–46.0)
Hemoglobin: 11.4 g/dL — ABNORMAL LOW (ref 12.0–15.0)
Immature Granulocytes: 0 %
Lymphocytes Relative: 38 %
Lymphs Abs: 1.1 10*3/uL (ref 0.7–4.0)
MCH: 23.5 pg — ABNORMAL LOW (ref 26.0–34.0)
MCHC: 30.3 g/dL (ref 30.0–36.0)
MCV: 77.4 fL — ABNORMAL LOW (ref 80.0–100.0)
Monocytes Absolute: 0.2 10*3/uL (ref 0.1–1.0)
Monocytes Relative: 7 %
Neutro Abs: 1.6 10*3/uL — ABNORMAL LOW (ref 1.7–7.7)
Neutrophils Relative %: 55 %
Platelets: 136 10*3/uL — ABNORMAL LOW (ref 150–400)
RBC: 4.86 MIL/uL (ref 3.87–5.11)
RDW: 13.8 % (ref 11.5–15.5)
WBC: 3 10*3/uL — ABNORMAL LOW (ref 4.0–10.5)
nRBC: 0 % (ref 0.0–0.2)

## 2019-05-27 LAB — COMPREHENSIVE METABOLIC PANEL
ALT: 28 U/L (ref 0–44)
AST: 54 U/L — ABNORMAL HIGH (ref 15–41)
Albumin: 3.3 g/dL — ABNORMAL LOW (ref 3.5–5.0)
Alkaline Phosphatase: 90 U/L (ref 38–126)
Anion gap: 11 (ref 5–15)
BUN: 5 mg/dL — ABNORMAL LOW (ref 6–20)
CO2: 20 mmol/L — ABNORMAL LOW (ref 22–32)
Calcium: 8.8 mg/dL — ABNORMAL LOW (ref 8.9–10.3)
Chloride: 100 mmol/L (ref 98–111)
Creatinine, Ser: 0.68 mg/dL (ref 0.44–1.00)
GFR calc Af Amer: >60 mL/min (ref >60)
GFR calc non Af Amer: >60 mL/min (ref >60)
Glucose, Bld: 316 mg/dL — ABNORMAL HIGH (ref 70–99)
Potassium: 3.8 mmol/L (ref 3.5–5.1)
Sodium: 131 mmol/L — ABNORMAL LOW (ref 135–145)
Total Bilirubin: 0.2 mg/dL — ABNORMAL LOW (ref 0.3–1.2)
Total Protein: 8.1 g/dL (ref 6.5–8.1)

## 2019-05-27 MED ORDER — ACETAMINOPHEN 500 MG PO TABS
1000.0000 mg | ORAL_TABLET | Freq: Once | ORAL | Status: AC
  Administered 2019-05-27: 1000 mg via ORAL
  Filled 2019-05-27: qty 2

## 2019-05-27 NOTE — ED Provider Notes (Signed)
[facility] EMERGENCY DEPARTMENT Provider Note   CSN: [id number] Arrival date & time: 05/27/19  1244     History   Chief Complaint Chief Complaint  Patient presents with  . Chest Pain    HPI Beverly Johnson is a 43 y.o. female.     Patient is a 43 year old female with past medical history of HIV disease, diabetes, and prior cholecystectomy.  She presents today for evaluation of chest discomfort.  This is been ongoing for the past several days.  She describes it as sharp and to the upper part of her chest.  She has been having low-grade fevers at home, today febrile to 100.4.  She denies any productive cough or shortness of breath.  She denies any fevers or chills.  She was seen yesterday at urgent care, then referred to the emergency department for further evaluation.  Patient left after waiting an extended period of time in the waiting room and returns today.   The history is provided by the patient.  Chest Pain Pain location:  Substernal area Pain quality: sharp   Pain radiates to:  Upper back Pain severity:  Moderate Onset quality:  Gradual Duration:  3 days Timing:  Constant Progression:  Worsening Chronicity:  New Relieved by:  Nothing Worsened by:  Nothing   Past Medical History:  Diagnosis Date  . Diabetes mellitus without complication ([place])   . Gallstones 03/10/2011  . HIV infection ([place])   . Immune deficiency disorder [facility])     Patient Active Problem List   Diagnosis Date Noted  . Medication monitoring encounter 11/12/2017  . Breast nodule 09/02/2017  . Screening examination for venereal disease 03/30/2017  . Encounter for long-term (current) use of high-risk medication 03/30/2017  . Microalbuminuria due to type 2 diabetes mellitus ([place]) 03/30/2017  . Knee pain, chronic 03/30/2017  . Vitamin D deficiency 03/21/2017  . Bilateral leg cramps 01/22/2017  . Healthcare maintenance 03/02/2014  . Morbid obesity with BMI of 45.0-49.9, adult  ([place]) 04/27/2012  . Uncontrolled type 2 diabetes mellitus ([place]) 12/08/2010  . Human immunodeficiency virus (HIV) disease ([place]) 09/29/2006    Past Surgical History:  Procedure Laterality Date  . CHOLECYSTECTOMY  06/29/2011   Procedure: LAPAROSCOPIC CHOLECYSTECTOMY WITH INTRAOPERATIVE CHOLANGIOGRAM;  Surgeon: Merrie Roof, MD;  Location: [place];  Service: General;  Laterality: N/A;  . NO PAST SURGERIES       OB History   No obstetric history on file.      Home Medications    Prior to Admission medications   Medication Sig Start Date End Date Taking? Authorizing Provider  benzonatate (TESSALON) 200 MG capsule Take 1 capsule (200 mg total) by mouth 3 (three) times daily as needed for cough. 05/26/19   Melynda Ripple, MD  GENVOYA 150-150-200-10 MG TABS tablet TAKE 1 TABLET BY MOUTH DAILY WITH BREAKFAST. Patient taking differently: Take 1 tablet by mouth daily with breakfast.  09/02/18   Comer, Okey Regal, MD  glucose blood (ACCU-CHEK ACTIVE STRIPS) test strip Use as instructed 05/03/18   Katy Fitch, MD  glucose blood test strip Check your sugar in the morning before you eat breakfast, and one hour after a meal. 05/26/19   Melynda Ripple, MD  glucose monitoring kit (FREESTYLE) monitoring kit 1 each by Does not apply route daily. Check glucose once in the morning before breakfast and 1 hour after a meal 05/26/19   Melynda Ripple, MD  hydrOXYzine (ATARAX/VISTARIL) 25 MG tablet Take 1 tablet (25 mg  total) by mouth every 4 (four) hours as needed. Patient taking differently: Take 25 mg by mouth every 4 (four) hours as needed for itching.  02/07/19   Lawyer, Harrell Gave, PA-C  ibuprofen (ADVIL) 600 MG tablet Take 1 tablet (600 mg total) by mouth every 6 (six) hours as needed. 05/26/19   Melynda Ripple, MD  lidocaine (XYLOCAINE) 2 % solution Use as directed 15 mLs in the mouth or throat as needed for mouth pain. 03/03/19   Domenic Moras, PA-C  metFORMIN (GLUCOPHAGE) 500 MG tablet Take 2  tablets (1,000 mg total) by mouth 2 (two) times daily with a meal. 03/22/17 09/08/26  Ledell Noss, MD  sitaGLIPtin (JANUVIA) 100 MG tablet Take 1 tablet (100 mg total) by mouth daily. 08/05/17   Neva Seat, MD    Family History Family History  Problem Relation Age of Onset  . Healthy Mother   . Healthy Father     Social History Social History   Tobacco Use  . Smoking status: Never Smoker  . Smokeless tobacco: Never Used  Substance Use Topics  . Alcohol use: No  . Drug use: No     Allergies   Patient has no known allergies.   Review of Systems Review of Systems  Cardiovascular: Positive for chest pain.     Physical Exam Updated Vital Signs BP 125/77 (BP Location: Right Arm)   Pulse 100   Temp 100.3 F (37.9 C) (Oral)   Resp 18   LMP 05/12/2019   SpO2 100%   Physical Exam Vitals signs and nursing note reviewed.  Constitutional:      General: She is not in acute distress.    Appearance: She is well-developed. She is not diaphoretic.  HENT:     Head: Normocephalic and atraumatic.  Neck:     Musculoskeletal: Normal range of motion and neck supple.  Cardiovascular:     Rate and Rhythm: Normal rate and regular rhythm.     Heart sounds: No murmur. No friction rub. No gallop.   Pulmonary:     Effort: Pulmonary effort is normal. No respiratory distress.     Breath sounds: Normal breath sounds. No wheezing.  Abdominal:     General: Bowel sounds are normal. There is no distension.     Palpations: Abdomen is soft.     Tenderness: There is no abdominal tenderness.  Musculoskeletal: Normal range of motion.     Right lower leg: She exhibits no tenderness. No edema.     Left lower leg: She exhibits no tenderness. No edema.  Skin:    General: Skin is warm and dry.  Neurological:     Mental Status: She is alert and oriented to person, place, and time.      ED Treatments / Results  Labs (all labs ordered are listed, but only abnormal results are displayed)  Labs Reviewed  COMPREHENSIVE METABOLIC PANEL  CBC WITH DIFFERENTIAL/PLATELET  LIPASE, BLOOD  TROPONIN I (HIGH SENSITIVITY)    EKG EKG Interpretation  Date/Time:  Saturday May 27 2019 12:51:57 EDT Ventricular Rate:  111 PR Interval:  144 QRS Duration: 64 QT Interval:  308 QTC Calculation: 418 R Axis:   36 Text Interpretation:  Sinus tachycardia Otherwise normal ECG Confirmed by Veryl Speak [id number]) on 05/27/2019 1:12:56 PM   Radiology Dg Chest Portable 1 View  Result Date: 05/26/2019 CLINICAL DATA:  Pt reports L sided chest pain that radiates to back with SOB, nausea, chills, fever, and non-productive cough x a few days. EXAM: PORTABLE  CHEST 1 VIEW COMPARISON:  Chest radiograph 03/03/2019 FINDINGS: Stable cardiomediastinal contours within normal limits for AP technique. Low volumes but the lungs are clear. No pneumothorax or large pleural effusion. No acute finding in the visualized skeleton. IMPRESSION: No evidence of active disease. Electronically Signed   By: Audie Pinto M.D.   On: 05/26/2019 19:41    Procedures Procedures (including critical care time)  Medications Ordered in ED Medications - No data to display   Initial Impression / Assessment and Plan / ED Course  I have reviewed the triage vital signs and the nursing notes.  Pertinent labs & imaging results that were available during my care of the patient were reviewed by me and considered in my medical decision making (see chart for details).  Patient presenting here with complaints of chest discomfort and fever.  Patient seen at urgent care yesterday, then referred to the ER last night.  Patient left due to prolonged wait time, but returns today.  She arrives here with temperature of 100.4.  Laboratory studies are essentially unremarkable.  Her vitals are stable and there is no hypoxia.  Patient work-up shows no evidence for a cardiac etiology and chest x-ray is clear.  Patient now febrile to 101.6.  She  will be given Tylenol and advised to take Tylenol and Motrin at home.  It is possible that this patient has COVID and she has been advised to quarantine at home until the results of her COVID test which was performed at urgent care are known.  MATTILYN CRITES was evaluated in Emergency Department on 05/27/2019 for the symptoms described in the history of present illness. She was evaluated in the context of the global COVID-19 pandemic, which necessitated consideration that the patient might be at risk for infection with the SARS-CoV-2 virus that causes COVID-19. Institutional protocols and algorithms that pertain to the evaluation of patients at risk for COVID-19 are in a state of rapid change based on information released by regulatory bodies including the CDC and federal and state organizations. These policies and algorithms were followed during the patient's care in the ED.   Final Clinical Impressions(s) / ED Diagnoses   Final diagnoses:  None    ED Discharge Orders    None       Veryl Speak, MD 05/27/19 [phone]

## 2019-05-27 NOTE — ED Triage Notes (Signed)
Pt to ER for evaluation of central chest pain described as sharp onset yesterday with fever as high as 101, at this time 100.2. Reports non productive cough. Denies shortness of breath. Reports was here last night but had to leave.

## 2019-05-27 NOTE — Discharge Instructions (Addendum)
Take Tylenol 1000 mg rotated with ibuprofen 600 mg every 4 hours as needed for fever.  Drink plenty of fluids and get plenty of rest.  Quarantine at home until the results of your COVID test are known.  Return to the emergency department if you develop severe chest pain, worsening breathing, or other new and concerning symptoms.       Infection Prevention Recommendations for Individuals Confirmed to have, or Being Evaluated for, 2019 Novel Coronavirus (COVID-19) Infection Who Receive Care at Home  Individuals who are confirmed to have, or are being evaluated for, COVID-19 should follow the prevention steps below until a healthcare provider or local or state health department says they can return to normal activities.  Stay home except to get medical care You should restrict activities outside your home, except for getting medical care. Do not go to work, school, or public areas, and do not use public transportation or taxis.  Call ahead before visiting your doctor Before your medical appointment, call the healthcare provider and tell them that you have, or are being evaluated for, COVID-19 infection. This will help the healthcare providers office take steps to keep other people from getting infected. Ask your healthcare provider to call the local or state health department.  Monitor your symptoms Seek prompt medical attention if your illness is worsening (e.g., difficulty breathing). Before going to your medical appointment, call the healthcare provider and tell them that you have, or are being evaluated for, COVID-19 infection. Ask your healthcare provider to call the local or state health department.  Wear a facemask You should wear a facemask that covers your nose and mouth when you are in the same room with other people and when you visit a healthcare provider. People who live with or visit you should also wear a facemask while they are in the same room with you.  Separate  yourself from other people in your home As much as possible, you should stay in a different room from other people in your home. Also, you should use a separate bathroom, if available.  Avoid sharing household items You should not share dishes, drinking glasses, cups, eating utensils, towels, bedding, or other items with other people in your home. After using these items, you should wash them thoroughly with soap and water.  Cover your coughs and sneezes Cover your mouth and nose with a tissue when you cough or sneeze, or you can cough or sneeze into your sleeve. Throw used tissues in a lined trash can, and immediately wash your hands with soap and water for at least 20 seconds or use an alcohol-based hand rub.  Wash your [facility] your hands often and thoroughly with soap and water for at least 20 seconds. You can use an alcohol-based hand sanitizer if soap and water are not available and if your hands are not visibly dirty. Avoid touching your eyes, nose, and mouth with unwashed hands.   Prevention Steps for Caregivers and Household Members of Individuals Confirmed to have, or Being Evaluated for, COVID-19 Infection Being Cared for in the Home  If you live with, or provide care at home for, a person confirmed to have, or being evaluated for, COVID-19 infection please follow these guidelines to prevent infection:  Follow healthcare providers instructions Make sure that you understand and can help the patient follow any healthcare provider instructions for all care.  Provide for the patients basic needs You should help the patient with basic needs in the home and provide  support for getting groceries, prescriptions, and other personal needs.  Monitor the patients symptoms If they are getting sicker, call his or her medical provider and tell them that the patient has, or is being evaluated for, COVID-19 infection. This will help the healthcare providers office take steps to keep  other people from getting infected. Ask the healthcare provider to call the local or state health department.  Limit the number of people who have contact with the patient If possible, have only one caregiver for the patient. Other household members should stay in another home or place of residence. If this is not possible, they should stay in another room, or be separated from the patient as much as possible. Use a separate bathroom, if available. Restrict visitors who do not have an essential need to be in the home.  Keep older adults, very young children, and other sick people away from the patient Keep older adults, very young children, and those who have compromised immune systems or chronic health conditions away from the patient. This includes people with chronic heart, lung, or kidney conditions, diabetes, and cancer.  Ensure good ventilation Make sure that shared spaces in the home have good air flow, such as from an air conditioner or an opened window, weather permitting.  Wash your hands often Wash your hands often and thoroughly with soap and water for at least 20 seconds. You can use an alcohol based hand sanitizer if soap and water are not available and if your hands are not visibly dirty. Avoid touching your eyes, nose, and mouth with unwashed hands. Use disposable paper towels to dry your hands. If not available, use dedicated cloth towels and replace them when they become wet.  Wear a facemask and gloves Wear a disposable facemask at all times in the room and gloves when you touch or have contact with the patients blood, body fluids, and/or secretions or excretions, such as sweat, saliva, sputum, nasal mucus, vomit, urine, or feces.  Ensure the mask fits over your nose and mouth tightly, and do not touch it during use. Throw out disposable facemasks and gloves after using them. Do not reuse. Wash your hands immediately after removing your facemask and gloves. If your  personal clothing becomes contaminated, carefully remove clothing and launder. Wash your hands after handling contaminated clothing. Place all used disposable facemasks, gloves, and other waste in a lined container before disposing them with other household waste. Remove gloves and wash your hands immediately after handling these items.  Do not share dishes, glasses, or other household items with the patient Avoid sharing household items. You should not share dishes, drinking glasses, cups, eating utensils, towels, bedding, or other items with a patient who is confirmed to have, or being evaluated for, COVID-19 infection. After the person uses these items, you should wash them thoroughly with soap and water.  Wash laundry thoroughly Immediately remove and wash clothes or bedding that have blood, body fluids, and/or secretions or excretions, such as sweat, saliva, sputum, nasal mucus, vomit, urine, or feces, on them. Wear gloves when handling laundry from the patient. Read and follow directions on labels of laundry or clothing items and detergent. In general, wash and dry with the warmest temperatures recommended on the label.  Clean all areas the individual has used often Clean all touchable surfaces, such as counters, tabletops, doorknobs, bathroom fixtures, toilets, phones, keyboards, tablets, and bedside tables, every day. Also, clean any surfaces that may have blood, body fluids, and/or secretions or excretions  on them. Wear gloves when cleaning surfaces the patient has come in contact with. Use a diluted bleach solution (e.g., dilute bleach with 1 part bleach and 10 parts water) or a household disinfectant with a label that says EPA-registered for coronaviruses. To make a bleach solution at home, add 1 tablespoon of bleach to 1 quart (4 cups) of water. For a larger supply, add  cup of bleach to 1 gallon (16 cups) of water. Read labels of cleaning products and follow recommendations provided on  product labels. Labels contain instructions for safe and effective use of the cleaning product including precautions you should take when applying the product, such as wearing gloves or eye protection and making sure you have good ventilation during use of the product. Remove gloves and wash hands immediately after cleaning.  Monitor yourself for signs and symptoms of illness Caregivers and household members are considered close contacts, should monitor their health, and will be asked to limit movement outside of the home to the extent possible. Follow the monitoring steps for close contacts listed on the symptom monitoring form.   ? If you have additional questions, contact your local health department or call the epidemiologist on call at [phone] (available 24/7). ? This guidance is subject to change. For the most up-to-date guidance from [facility], please refer to their website: [email or web address]

## 2019-05-28 LAB — NOVEL CORONAVIRUS, NAA (HOSP ORDER, SEND-OUT TO REF LAB; TAT 18-24 HRS): SARS-CoV-2, NAA: DETECTED — AB

## 2019-05-29 ENCOUNTER — Telehealth (HOSPITAL_COMMUNITY): Payer: Self-pay | Admitting: Emergency Medicine

## 2019-05-29 ENCOUNTER — Telehealth: Payer: Self-pay | Admitting: *Deleted

## 2019-05-29 NOTE — Telephone Encounter (Signed)
-----   Message from Thayer Headings, MD sent at 05/28/2019 10:58 AM EDT ----- This patient was recently in the ED for fever and cough and is COVID-19 positive.   Can you see how she is feeling?   Also, she has an appt with the lab on 10/14 she obviously should not come to - both this and her follow up appt on 10/28 can be put off 1-2 months.   thanks

## 2019-05-29 NOTE — Telephone Encounter (Signed)
Spoke with patient. She is currently at home.  She feels the same as when she went to the ER 10/9, says the coughing hurts. She denies shortness of breath or difficulty catching her breath.  She says she's warm, but doesn't have a thermometer at home.  She said was unable to afford the tessalon.   She is worried about exposing her husband to covid, wanted to know why there wasn't a program to put her up in a hotel room (RN explained this was for people without housing).  RN encouraged patient to follow ER instructions [facility]), to call 911 if she feels worse, and to really make sure she is taking her HIV and diabetic medication. RN encouraged patient to reach out if she had any questions. Patient said she understood. RN called Sam - pharmacist at [facility] - to see if he could send the [place] prescription to [place]. He stated she has refused it, that she only ever fills lancets and diabetic test strips - that she has not filled diabetic medication or Genvoya since January (and in fact only accepted 2 bottles of Genvoya in the last 2 years).  She always states that she "has some of that at home " per Sam.  Last script of Genvoya sent from [place] 08/2018 with 3 refills to [facility].   He has a delivery ready to take to her house today of tessalon, ibuprofen, lancets, and test strips.  Landis Gandy, RN

## 2019-05-29 NOTE — Telephone Encounter (Signed)
Positive Covid, Patient contacted and made aware of    results, all questions answered Pt did not have any questions

## 2019-05-31 ENCOUNTER — Other Ambulatory Visit: Payer: Medicaid Other

## 2019-06-02 ENCOUNTER — Inpatient Hospital Stay (HOSPITAL_COMMUNITY)
Admission: EM | Admit: 2019-06-02 | Discharge: 2019-06-06 | DRG: 177 | Disposition: A | Discharge status: Home / Self Care | Payer: Medicaid Other | Attending: Internal Medicine | Admitting: Internal Medicine

## 2019-06-02 ENCOUNTER — Encounter (HOSPITAL_COMMUNITY): Payer: Self-pay

## 2019-06-02 ENCOUNTER — Emergency Department (HOSPITAL_COMMUNITY): Payer: Medicaid Other

## 2019-06-02 DIAGNOSIS — Z23 Encounter for immunization: Secondary | ICD-10-CM | POA: Diagnosis not present

## 2019-06-02 DIAGNOSIS — Z21 Asymptomatic human immunodeficiency virus [HIV] infection status: Secondary | ICD-10-CM | POA: Diagnosis not present

## 2019-06-02 DIAGNOSIS — Z6839 Body mass index (BMI) 39.0-39.9, adult: Secondary | ICD-10-CM | POA: Diagnosis not present

## 2019-06-02 DIAGNOSIS — E559 Vitamin D deficiency, unspecified: Secondary | ICD-10-CM | POA: Diagnosis present

## 2019-06-02 DIAGNOSIS — R5381 Other malaise: Secondary | ICD-10-CM | POA: Diagnosis not present

## 2019-06-02 DIAGNOSIS — J1289 Other viral pneumonia: Secondary | ICD-10-CM | POA: Diagnosis present

## 2019-06-02 DIAGNOSIS — U071 COVID-19: Principal | ICD-10-CM | POA: Diagnosis present

## 2019-06-02 DIAGNOSIS — I10 Essential (primary) hypertension: Secondary | ICD-10-CM | POA: Diagnosis not present

## 2019-06-02 DIAGNOSIS — J069 Acute upper respiratory infection, unspecified: Secondary | ICD-10-CM | POA: Diagnosis not present

## 2019-06-02 DIAGNOSIS — B20 Human immunodeficiency virus [HIV] disease: Secondary | ICD-10-CM | POA: Diagnosis present

## 2019-06-02 DIAGNOSIS — E1165 Type 2 diabetes mellitus with hyperglycemia: Secondary | ICD-10-CM | POA: Diagnosis not present

## 2019-06-02 DIAGNOSIS — M2550 Pain in unspecified joint: Secondary | ICD-10-CM | POA: Diagnosis not present

## 2019-06-02 DIAGNOSIS — E119 Type 2 diabetes mellitus without complications: Secondary | ICD-10-CM | POA: Diagnosis present

## 2019-06-02 DIAGNOSIS — Z7984 Long term (current) use of oral hypoglycemic drugs: Secondary | ICD-10-CM

## 2019-06-02 DIAGNOSIS — R0902 Hypoxemia: Secondary | ICD-10-CM | POA: Diagnosis not present

## 2019-06-02 DIAGNOSIS — R05 Cough: Secondary | ICD-10-CM | POA: Diagnosis not present

## 2019-06-02 DIAGNOSIS — R059 Cough, unspecified: Secondary | ICD-10-CM

## 2019-06-02 DIAGNOSIS — R0789 Other chest pain: Secondary | ICD-10-CM | POA: Diagnosis not present

## 2019-06-02 DIAGNOSIS — Z7401 Bed confinement status: Secondary | ICD-10-CM | POA: Diagnosis not present

## 2019-06-02 DIAGNOSIS — A0839 Other viral enteritis: Secondary | ICD-10-CM | POA: Diagnosis not present

## 2019-06-02 DIAGNOSIS — Z79899 Other long term (current) drug therapy: Secondary | ICD-10-CM

## 2019-06-02 DIAGNOSIS — R0602 Shortness of breath: Secondary | ICD-10-CM | POA: Diagnosis not present

## 2019-06-02 DIAGNOSIS — J9601 Acute respiratory failure with hypoxia: Secondary | ICD-10-CM | POA: Diagnosis present

## 2019-06-02 DIAGNOSIS — R079 Chest pain, unspecified: Secondary | ICD-10-CM | POA: Diagnosis not present

## 2019-06-02 DIAGNOSIS — E669 Obesity, unspecified: Secondary | ICD-10-CM | POA: Diagnosis present

## 2019-06-02 LAB — COMPREHENSIVE METABOLIC PANEL
ALT: 22 U/L (ref 0–44)
AST: 44 U/L — ABNORMAL HIGH (ref 15–41)
Albumin: 2.9 g/dL — ABNORMAL LOW (ref 3.5–5.0)
Alkaline Phosphatase: 86 U/L (ref 38–126)
Anion gap: 13 (ref 5–15)
BUN: 8 mg/dL (ref 6–20)
CO2: 20 mmol/L — ABNORMAL LOW (ref 22–32)
Calcium: 8.4 mg/dL — ABNORMAL LOW (ref 8.9–10.3)
Chloride: 96 mmol/L — ABNORMAL LOW (ref 98–111)
Creatinine, Ser: 0.82 mg/dL (ref 0.44–1.00)
GFR calc Af Amer: >60 mL/min (ref >60)
GFR calc non Af Amer: >60 mL/min (ref >60)
Glucose, Bld: 338 mg/dL — ABNORMAL HIGH (ref 70–99)
Potassium: 3.9 mmol/L (ref 3.5–5.1)
Sodium: 129 mmol/L — ABNORMAL LOW (ref 135–145)
Total Bilirubin: 0.6 mg/dL (ref 0.3–1.2)
Total Protein: 8.1 g/dL (ref 6.5–8.1)

## 2019-06-02 LAB — CBC WITH DIFFERENTIAL/PLATELET
Abs Immature Granulocytes: 0.01 10*3/uL (ref 0.00–0.07)
Basophils Absolute: 0 10*3/uL (ref 0.0–0.1)
Basophils Relative: 0 %
Eosinophils Absolute: 0 10*3/uL (ref 0.0–0.5)
Eosinophils Relative: 0 %
HCT: 40.7 % (ref 36.0–46.0)
Hemoglobin: 12.6 g/dL (ref 12.0–15.0)
Immature Granulocytes: 0 %
Lymphocytes Relative: 38 %
Lymphs Abs: 1.1 10*3/uL (ref 0.7–4.0)
MCH: 23.8 pg — ABNORMAL LOW (ref 26.0–34.0)
MCHC: 31 g/dL (ref 30.0–36.0)
MCV: 76.8 fL — ABNORMAL LOW (ref 80.0–100.0)
Monocytes Absolute: 0.1 10*3/uL (ref 0.1–1.0)
Monocytes Relative: 4 %
Neutro Abs: 1.7 10*3/uL (ref 1.7–7.7)
Neutrophils Relative %: 58 %
Platelets: 127 10*3/uL — ABNORMAL LOW (ref 150–400)
RBC: 5.3 MIL/uL — ABNORMAL HIGH (ref 3.87–5.11)
RDW: 14 % (ref 11.5–15.5)
WBC: 2.9 10*3/uL — ABNORMAL LOW (ref 4.0–10.5)
nRBC: 0 % (ref 0.0–0.2)

## 2019-06-02 LAB — LACTIC ACID, PLASMA
Lactic Acid, Venous: 2.5 mmol/L (ref 0.5–1.9)
Lactic Acid, Venous: 1.9 mmol/L (ref 0.5–1.9)

## 2019-06-02 MED ORDER — INSULIN ASPART 100 UNIT/ML ~~LOC~~ SOLN
0.0000 [IU] | Freq: Every day | SUBCUTANEOUS | Status: DC
  Administered 2019-06-03: 03:00:00 3 [IU] via SUBCUTANEOUS
  Administered 2019-06-03: 01:00:00 2 [IU] via SUBCUTANEOUS
  Administered 2019-06-04: 22:00:00 4 [IU] via SUBCUTANEOUS

## 2019-06-02 MED ORDER — IPRATROPIUM-ALBUTEROL 20-100 MCG/ACT IN AERS
1.0000 | INHALATION_SPRAY | Freq: Four times a day (QID) | RESPIRATORY_TRACT | Status: DC
  Administered 2019-06-03 – 2019-06-06 (×13): 1 via RESPIRATORY_TRACT
  Filled 2019-06-02: qty 4

## 2019-06-02 MED ORDER — ZINC SULFATE 220 (50 ZN) MG PO CAPS
220.0000 mg | ORAL_CAPSULE | Freq: Every day | ORAL | Status: DC
  Administered 2019-06-03 – 2019-06-06 (×4): 220 mg via ORAL
  Filled 2019-06-02 (×4): qty 1

## 2019-06-02 MED ORDER — VITAMIN C 500 MG PO TABS
500.0000 mg | ORAL_TABLET | Freq: Every day | ORAL | Status: DC
  Administered 2019-06-03 – 2019-06-06 (×4): 500 mg via ORAL
  Filled 2019-06-02 (×4): qty 1

## 2019-06-02 MED ORDER — ONDANSETRON HCL 4 MG/2ML IJ SOLN: 4.0000 mg | Freq: Four times a day (QID) | INTRAMUSCULAR | Status: DC | PRN

## 2019-06-02 MED ORDER — SODIUM CHLORIDE 0.9 % IV SOLN
200.0000 mg | Freq: Once | INTRAVENOUS | Status: AC
  Administered 2019-06-03: 03:00:00 200 mg via INTRAVENOUS
  Filled 2019-06-02: qty 40

## 2019-06-02 MED ORDER — DEXAMETHASONE SODIUM PHOSPHATE 10 MG/ML IJ SOLN
6.0000 mg | INTRAMUSCULAR | Status: DC
  Administered 2019-06-03 – 2019-06-05 (×4): 6 mg via INTRAVENOUS
  Filled 2019-06-02 (×4): qty 1

## 2019-06-02 MED ORDER — HYDROCOD POLST-CPM POLST ER 10-8 MG/5ML PO SUER
5.0000 mL | Freq: Two times a day (BID) | ORAL | Status: DC | PRN
  Administered 2019-06-03: 5 mL via ORAL
  Filled 2019-06-02: qty 5

## 2019-06-02 MED ORDER — ONDANSETRON HCL 4 MG PO TABS: 4.0000 mg | ORAL_TABLET | Freq: Four times a day (QID) | ORAL | Status: DC | PRN

## 2019-06-02 MED ORDER — ONDANSETRON HCL 4 MG/2ML IJ SOLN
4.0000 mg | Freq: Once | INTRAMUSCULAR | Status: AC
  Administered 2019-06-02: 21:00:00 4 mg via INTRAVENOUS
  Filled 2019-06-02: qty 2

## 2019-06-02 MED ORDER — ELVITEG-COBIC-EMTRICIT-TENOFAF 150-150-200-10 MG PO TABS
1.0000 | ORAL_TABLET | Freq: Every day | ORAL | Status: DC
  Administered 2019-06-03 – 2019-06-06 (×4): 1 via ORAL
  Filled 2019-06-02 (×6): qty 1

## 2019-06-02 MED ORDER — SODIUM CHLORIDE 0.9 % IV SOLN
100.0000 mg | INTRAVENOUS | Status: DC
  Administered 2019-06-03 – 2019-06-05 (×3): 100 mg via INTRAVENOUS
  Filled 2019-06-02 (×4): qty 20

## 2019-06-02 MED ORDER — ACETAMINOPHEN 325 MG PO TABS
650.0000 mg | ORAL_TABLET | Freq: Four times a day (QID) | ORAL | Status: DC | PRN
  Administered 2019-06-03: 05:00:00 650 mg via ORAL
  Filled 2019-06-02: qty 2

## 2019-06-02 MED ORDER — ENOXAPARIN SODIUM 40 MG/0.4ML ~~LOC~~ SOLN: 40.0000 mg | SUBCUTANEOUS | Status: DC

## 2019-06-02 MED ORDER — GUAIFENESIN-DM 100-10 MG/5ML PO SYRP
10.0000 mL | ORAL_SOLUTION | ORAL | Status: DC | PRN
  Administered 2019-06-03: 10 mL via ORAL
  Filled 2019-06-02: qty 10

## 2019-06-02 MED ORDER — SODIUM CHLORIDE 0.9 % IV BOLUS
1000.0000 mL | Freq: Once | INTRAVENOUS | Status: AC
  Administered 2019-06-02: 22:00:00 1000 mL via INTRAVENOUS

## 2019-06-02 MED ORDER — INSULIN ASPART 100 UNIT/ML ~~LOC~~ SOLN
0.0000 [IU] | Freq: Three times a day (TID) | SUBCUTANEOUS | Status: DC
  Administered 2019-06-03: 09:00:00 7 [IU] via SUBCUTANEOUS
  Administered 2019-06-03 (×2): 5 [IU] via SUBCUTANEOUS
  Administered 2019-06-04: 09:00:00 9 [IU] via SUBCUTANEOUS
  Administered 2019-06-04: 13:00:00 7 [IU] via SUBCUTANEOUS
  Administered 2019-06-04: 17:00:00 9 [IU] via SUBCUTANEOUS
  Administered 2019-06-05: 7 [IU] via SUBCUTANEOUS

## 2019-06-02 NOTE — ED Triage Notes (Signed)
Pt BIB by [facility] from home. Tested Positive for COVID-19 on Monday. Complains of SOB, chest pain, and Nausea today. Pt currently on 2L and 97%. At home was 88% on RA. VSS with EMS except O2. 265 CBG. Slightly tachycardic (114).

## 2019-06-02 NOTE — Progress Notes (Signed)
Pharmacy Note  43yo female tested positive for COVID-19 earlier this week, now w/ SOB, CP, and nausea; O2 ar 88% on RA per EMS, now 97% on 2L w/ CXR suggestive of CODVID-19 PNA, to start remdesivir.  Plan: Remdesivir 200mg  IV x1 then 100mg  IV Q24H x4 doses.  Wynona Neat, PharmD, BCPS 06/02/2019 11:54 PM

## 2019-06-02 NOTE — H&P (Signed)
History and Physical    Beverly Johnson WIO:[id number] DOB: [birth year] DOA: 06/02/2019  PCP: Thayer Headings, MD  Patient coming from: Home  I have personally briefly reviewed patient's old medical records in [place]  Chief Complaint: Shortness of breath  HPI: Beverly Johnson is a 43 y.o. female with medical history significant for HIV, type 2 diabetes, and obesity who presents to the ED for evaluation of shortness of breath.  Patient states she had new onset of shortness of breath with dry cough beginning 05/26/2019.  She went to urgent care and had a SARS-CoV-2 test performed which resulted positive on 05/29/2019.  She has been having progressive shortness of breath with exertion and at rest, chills, diaphoresis, nausea, vomiting, and diarrhea.  She has had difficulty maintaining adequate oral intake and reports inability to keep anything down other than ice chips.  She has had some lightheadedness and dizziness without syncope.  She has had mild generalized body aches.  Due to her continued symptoms she called EMS.  Per ED documentation she is noted to have O2 saturation of 88% on room air which improved to 97% on 2 L supplemental O2 via [place].  She was brought to the ED for further evaluation.  ED Course:  Initial vitals showed BP 135/93, pulse 112, RR 29, temp 100.1 Fahrenheit, SPO2 97% on 2 L supplemental O2 via [place].  Labs notable for WBC 2.9, hemoglobin 12.6, platelets 127,000, sodium 129, potassium 3.9, bicarb 20, BUN 8, creatinine 0.82, serum glucose 338, AST 44, ALT 22, alk phos 86, total bilirubin 0.6, lactic acid 2.5 improved to 1.9 on repeat.  Blood cultures obtained and pending.  Portable chest x-ray shows patchy heterogenous lung opacities in the mid and lower lung zone predominant distribution which is new compared to prior.  Patient was given 1 L normal saline and the hospitalist service was consulted to admit for further evaluation and management.  Review of Systems:  All systems reviewed and are negative except as documented in history of present illness above.   Past Medical History:  Diagnosis Date  . Diabetes mellitus without complication ([place])   . Gallstones 03/10/2011  . HIV infection ([place])   . Immune deficiency disorder [facility])     Past Surgical History:  Procedure Laterality Date  . CHOLECYSTECTOMY  06/29/2011   Procedure: LAPAROSCOPIC CHOLECYSTECTOMY WITH INTRAOPERATIVE CHOLANGIOGRAM;  Surgeon: Merrie Roof, MD;  Location: [facility];  Service: General;  Laterality: N/A;  . NO PAST SURGERIES      Social History:  reports that she has never smoked. She has never used smokeless tobacco. She reports that she does not drink alcohol or use drugs.  No Known Allergies  Family History  Problem Relation Age of Onset  . Healthy Mother   . Healthy Father      Prior to Admission medications   Medication Sig Start Date End Date Taking? Authorizing Provider  benzonatate (TESSALON) 200 MG capsule Take 1 capsule (200 mg total) by mouth 3 (three) times daily as needed for cough. 05/26/19  Yes Melynda Ripple, MD  GENVOYA 150-150-200-10 MG TABS tablet TAKE 1 TABLET BY MOUTH DAILY WITH BREAKFAST. Patient taking differently: Take 1 tablet by mouth daily with breakfast.  09/02/18  Yes Comer, Okey Regal, MD  glucose blood (ACCU-CHEK ACTIVE STRIPS) test strip Use as instructed 05/03/18  Yes Katy Fitch, MD  glucose blood test strip Check your sugar in the morning before you eat breakfast, and one hour after  a meal. 05/26/19  Yes Melynda Ripple, MD  glucose monitoring kit (FREESTYLE) monitoring kit 1 each by Does not apply route daily. Check glucose once in the morning before breakfast and 1 hour after a meal 05/26/19  Yes Melynda Ripple, MD  hydrOXYzine (ATARAX/VISTARIL) 25 MG tablet Take 1 tablet (25 mg total) by mouth every 4 (four) hours as needed. Patient taking differently: Take 25 mg by mouth every 4 (four) hours as needed for itching.  02/07/19  Yes  Lawyer, Harrell Gave, PA-C  ibuprofen (ADVIL) 600 MG tablet Take 1 tablet (600 mg total) by mouth every 6 (six) hours as needed. 05/26/19  Yes Melynda Ripple, MD  metFORMIN (GLUCOPHAGE) 500 MG tablet Take 2 tablets (1,000 mg total) by mouth 2 (two) times daily with a meal. 03/22/17 09/08/26 Yes Ledell Noss, MD  sitaGLIPtin (JANUVIA) 100 MG tablet Take 1 tablet (100 mg total) by mouth daily. 08/05/17  Yes Neva Seat, MD  lidocaine (XYLOCAINE) 2 % solution Use as directed 15 mLs in the mouth or throat as needed for mouth pain. Patient not taking: Reported on 06/02/2019 03/03/19   Domenic Moras, PA-C    Physical Exam: Vitals:   06/02/19 2230 06/02/19 2236 06/02/19 2242 06/02/19 2300  BP: 122/84   122/84  Pulse: (!) 107 (!) 108 (!) 106 (!) 107  Resp: (!) 39 (!) 29 (!) 26 (!) 24  Temp:      TempSrc:      SpO2: 99% 98% 99% 97%  Weight:      Height:        Constitutional: Obese woman resting supine in bed, NAD, calm, appears anxious but comfortable Eyes: PERRL, lids and conjunctivae normal ENMT: Mucous membranes are moist. Posterior pharynx clear of any exudate or lesions.Normal dentition.  Neck: normal, supple, no masses. Respiratory: Distant breath sounds using disposable stethoscope, no obvious wheezing or crackles.  Normal respiratory effort. No accessory muscle use.  Cardiovascular: Regular rate and rhythm, no murmurs / rubs / gallops. No extremity edema. 2+ pedal pulses. Abdomen: no tenderness, no masses palpated. No hepatosplenomegaly. Bowel sounds positive.  Musculoskeletal: no clubbing / cyanosis. No joint deformity upper and lower extremities. Good ROM, no contractures. Normal muscle tone.  Skin: no rashes, lesions, ulcers. No induration Neurologic: CN 2-12 grossly intact. Sensation intact, Strength 5/5 in all 4.  Psychiatric: Normal judgment and insight. Alert and oriented x 3. Normal mood.     Labs on Admission: I have personally reviewed following labs and imaging studies   CBC: Recent Labs  Lab 05/27/19 1421 06/02/19 2053  WBC 3.0* 2.9*  NEUTROABS 1.6* 1.7  HGB 11.4* 12.6  HCT 37.6 40.7  MCV 77.4* 76.8*  PLT 136* 944*   Basic Metabolic Panel: Recent Labs  Lab 05/27/19 1421 06/02/19 2053  NA 131* 129*  K 3.8 3.9  CL 100 96*  CO2 20* 20*  GLUCOSE 316* 338*  BUN 5* 8  CREATININE 0.68 0.82  CALCIUM 8.8* 8.4*   GFR: Estimated Creatinine Clearance: 117.3 mL/min (by C-G formula based on SCr of 0.82 mg/dL). Liver Function Tests: Recent Labs  Lab 05/27/19 1421 06/02/19 2053  AST 54* 44*  ALT 28 22  ALKPHOS 90 86  BILITOT 0.2* 0.6  PROT 8.1 8.1  ALBUMIN 3.3* 2.9*   Recent Labs  Lab 05/27/19 1421  LIPASE 45   No results for input(s): AMMONIA in the last 168 hours. Coagulation Profile: No results for input(s): INR, PROTIME in the last 168 hours. Cardiac Enzymes: No results for input(s):  CKTOTAL, CKMB, CKMBINDEX, TROPONINI in the last 168 hours. BNP (last 3 results) No results for input(s): PROBNP in the last 8760 hours. HbA1C: No results for input(s): HGBA1C in the last 72 hours. CBG: No results for input(s): GLUCAP in the last 168 hours. Lipid Profile: No results for input(s): CHOL, HDL, LDLCALC, TRIG, CHOLHDL, LDLDIRECT in the last 72 hours. Thyroid Function Tests: No results for input(s): TSH, T4TOTAL, FREET4, T3FREE, THYROIDAB in the last 72 hours. Anemia Panel: No results for input(s): VITAMINB12, FOLATE, FERRITIN, TIBC, IRON, RETICCTPCT in the last 72 hours. Urine analysis:    Component Value Date/Time   COLORURINE YELLOW 09/08/2018 0009   APPEARANCEUR CLOUDY (A) 09/08/2018 0009   LABSPEC 1.015 05/26/2019 1137   PHURINE 6.0 05/26/2019 1137   GLUCOSEU 500 (A) 05/26/2019 1137   GLUCOSEU NEG mg/dL 09/17/2006 2003   HGBUR NEGATIVE 05/26/2019 1137   BILIRUBINUR NEGATIVE 05/26/2019 1137   KETONESUR NEGATIVE 05/26/2019 1137   PROTEINUR NEGATIVE 05/26/2019 1137   UROBILINOGEN 0.2 05/26/2019 1137   NITRITE NEGATIVE  05/26/2019 1137   LEUKOCYTESUR NEGATIVE 05/26/2019 1137    Radiological Exams on Admission: Dg Chest Portable 1 View  Result Date: 06/02/2019 CLINICAL DATA:  Cough and shortness of breath. Hypoxia. COVID-19 positive. EXAM: PORTABLE CHEST 1 VIEW COMPARISON:  05/27/2019 FINDINGS: Low lung volumes. Development of vague patchy heterogeneous lung opacities in the mid and lower lung zone predominant distribution. Heart size normal for technique. No evidence of pulmonary edema, large pleural effusion or pneumothorax. IMPRESSION: Development of vague patchy heterogeneous lung opacities in the mid and lower lung zone predominant distribution, consistent with multifocal pneumonia, particularly COVID-19 . Electronically Signed   By: Keith Rake M.D.   On: 06/02/2019 21:29    EKG: Independently reviewed. Sinus tachycardia without acute ischemic changes.  Not significantly changed from prior.  Assessment/Plan Principal Problem:   Acute respiratory disease due to COVID-19 virus Active Problems:   Human immunodeficiency virus (HIV) disease ([place])   Uncontrolled type 2 diabetes mellitus ([place])  Beverly Johnson is a 43 y.o. female with medical history significant for HIV, type 2 diabetes, and obesity who is admitted with acute respiratory disease and gastroenteritis due to COVID-19 viral infection.   Acute respiratory disease and gastroenteritis due to COVID-19 viral infection: SARS-CoV-2 test positive from 05/26/2019.  She had reported O2 saturation of 88% on room air at home.  On my examination O2 sat drops to 92% on room air and improved to 98% on 2 L supplemental O2 via [place].  Chest x-ray shows patchy heterogeneous lung opacities concerning for multifocal pneumonia. -Admit to [facility] pending bed availability -Start IV dexamethasone 6 mg daily -Start IV remdesivir per pharmacy -Continue Combivent, incentive spirometer, flutter valve -Continue antitussives, vitamin C, zinc -Obtain  COVID-19/inflammatory labs, daily labs ordered  HIV: Labs from 11/15/2018 showed RNA viral load <20 and CD4 600. -Continue home Genvoya  Type 2 diabetes: Home regimen includes Metformin and Januvia. -Start sensitive SSI + HS coverage, adjust as needed -Check A1c   DVT prophylaxis: Lovenox Code Status: Full code, confirmed with patient Family Communication: Discussed with husband Zeyna Mkrtchyan by phone, [phone] Disposition Plan: Admit to [place] called: None Admission status: Inpatient, patient likely requires greater than 2 midnight stay for management of acute respiratory disease and gastroenteritis due to COVID-19 viral infection including continued IV steroids and remdesivir for anticipated 5-day course.   Zada Finders MD [facility]  If 7PM-7AM, please contact night-coverage [email or web address]  06/02/2019, 11:36 PM

## 2019-06-02 NOTE — ED Provider Notes (Signed)
[facility] EMERGENCY DEPARTMENT Provider Note   CSN: [id number] Arrival date & time: 06/02/19  2002     History   Chief Complaint Chief Complaint  Patient presents with  . COVID +    HPI Beverly Johnson is a 43 y.o. female.     The history is provided by the patient and medical records. No language interpreter was used.  Cough Cough characteristics:  Non-productive Severity:  Severe Onset quality:  Gradual Duration:  1 week Timing:  Constant Progression:  Worsening Chronicity:  New Relieved by:  Nothing Worsened by:  Nothing Ineffective treatments:  None tried Associated symptoms: chest pain, chills, fever and shortness of breath   Associated symptoms: no diaphoresis, no headaches, no rash and no wheezing   Risk factors: recent infection (covid 19)     Past Medical History:  Diagnosis Date  . Diabetes mellitus without complication ([place])   . Gallstones 03/10/2011  . HIV infection ([place])   . Immune deficiency disorder [facility])     Patient Active Problem List   Diagnosis Date Noted  . Medication monitoring encounter 11/12/2017  . Breast nodule 09/02/2017  . Screening examination for venereal disease 03/30/2017  . Encounter for long-term (current) use of high-risk medication 03/30/2017  . Microalbuminuria due to type 2 diabetes mellitus ([place]) 03/30/2017  . Knee pain, chronic 03/30/2017  . Vitamin D deficiency 03/21/2017  . Bilateral leg cramps 01/22/2017  . Healthcare maintenance 03/02/2014  . Morbid obesity with BMI of 45.0-49.9, adult ([place]) 04/27/2012  . Uncontrolled type 2 diabetes mellitus ([place]) 12/08/2010  . Human immunodeficiency virus (HIV) disease ([place]) 09/29/2006    Past Surgical History:  Procedure Laterality Date  . CHOLECYSTECTOMY  06/29/2011   Procedure: LAPAROSCOPIC CHOLECYSTECTOMY WITH INTRAOPERATIVE CHOLANGIOGRAM;  Surgeon: Merrie Roof, MD;  Location: [facility];  Service: General;  Laterality: N/A;  . NO PAST SURGERIES        OB History   No obstetric history on file.      Home Medications    Prior to Admission medications   Medication Sig Start Date End Date Taking? Authorizing Provider  benzonatate (TESSALON) 200 MG capsule Take 1 capsule (200 mg total) by mouth 3 (three) times daily as needed for cough. 05/26/19   Melynda Ripple, MD  GENVOYA 150-150-200-10 MG TABS tablet TAKE 1 TABLET BY MOUTH DAILY WITH BREAKFAST. Patient taking differently: Take 1 tablet by mouth daily with breakfast.  09/02/18   Comer, Okey Regal, MD  glucose blood (ACCU-CHEK ACTIVE STRIPS) test strip Use as instructed 05/03/18   Katy Fitch, MD  glucose blood test strip Check your sugar in the morning before you eat breakfast, and one hour after a meal. 05/26/19   Melynda Ripple, MD  glucose monitoring kit (FREESTYLE) monitoring kit 1 each by Does not apply route daily. Check glucose once in the morning before breakfast and 1 hour after a meal 05/26/19   Melynda Ripple, MD  hydrOXYzine (ATARAX/VISTARIL) 25 MG tablet Take 1 tablet (25 mg total) by mouth every 4 (four) hours as needed. Patient taking differently: Take 25 mg by mouth every 4 (four) hours as needed for itching.  02/07/19   Lawyer, Harrell Gave, PA-C  ibuprofen (ADVIL) 600 MG tablet Take 1 tablet (600 mg total) by mouth every 6 (six) hours as needed. 05/26/19   Melynda Ripple, MD  lidocaine (XYLOCAINE) 2 % solution Use as directed 15 mLs in the mouth or throat as needed for mouth pain. 03/03/19   Rona Ravens,  Gertie Fey, PA-C  metFORMIN (GLUCOPHAGE) 500 MG tablet Take 2 tablets (1,000 mg total) by mouth 2 (two) times daily with a meal. 03/22/17 09/08/26  Ledell Noss, MD  sitaGLIPtin (JANUVIA) 100 MG tablet Take 1 tablet (100 mg total) by mouth daily. 08/05/17   Neva Seat, MD    Family History Family History  Problem Relation Age of Onset  . Healthy Mother   . Healthy Father     Social History Social History   Tobacco Use  . Smoking status: Never Smoker  . Smokeless  tobacco: Never Used  Substance Use Topics  . Alcohol use: No  . Drug use: No     Allergies   Patient has no known allergies.   Review of Systems Review of Systems  Constitutional: Positive for chills, fatigue and fever. Negative for diaphoresis.  Eyes: Negative for visual disturbance.  Respiratory: Positive for cough, chest tightness and shortness of breath. Negative for wheezing and stridor.   Cardiovascular: Positive for chest pain. Negative for palpitations and leg swelling.  Gastrointestinal: Positive for diarrhea, nausea and vomiting. Negative for abdominal pain and constipation.  Genitourinary: Negative for flank pain and frequency.  Musculoskeletal: Negative for back pain, neck pain and neck stiffness.  Skin: Negative for rash and wound.  Neurological: Negative for dizziness, light-headedness and headaches.  Psychiatric/Behavioral: Negative for agitation.  All other systems reviewed and are negative.    Physical Exam Updated Vital Signs BP (!) 113/101 (BP Location: Left Arm)   Pulse (!) 114   Temp 100.1 F (37.8 C) (Oral)   Resp 16   Ht 5' 7" (1.702 m)   Wt 113.4 kg   LMP 05/12/2019   SpO2 97%   BMI 39.16 kg/m   Physical Exam Vitals signs and nursing note reviewed.  Constitutional:      General: She is not in acute distress.    Appearance: She is well-developed. She is ill-appearing. She is not toxic-appearing or diaphoretic.  HENT:     Head: Normocephalic and atraumatic.     Right Ear: External ear normal.     Left Ear: External ear normal.     Nose: Nose normal. No congestion or rhinorrhea.     Mouth/Throat:     Mouth: Mucous membranes are dry.     Pharynx: No oropharyngeal exudate or posterior oropharyngeal erythema.  Eyes:     Conjunctiva/sclera: Conjunctivae normal.     Pupils: Pupils are equal, round, and reactive to light.  Neck:     Musculoskeletal: Normal range of motion and neck supple. No muscular tenderness.  Cardiovascular:     Rate and  Rhythm: Regular rhythm. Tachycardia present.     Pulses: Normal pulses.     Heart sounds: No murmur.  Pulmonary:     Effort: Pulmonary effort is normal. No accessory muscle usage or respiratory distress.     Breath sounds: No stridor. No wheezing, rhonchi or rales.  Chest:     Chest wall: No tenderness.  Abdominal:     General: There is no distension.     Tenderness: There is no abdominal tenderness. There is no right CVA tenderness, left CVA tenderness or rebound.  Musculoskeletal:        General: No tenderness or signs of injury.     Right lower leg: No edema.     Left lower leg: No edema.  Skin:    General: Skin is warm.     Capillary Refill: Capillary refill takes less than 2 seconds.  Findings: No erythema or rash.  Neurological:     Mental Status: She is alert and oriented to person, place, and time.     Motor: No abnormal muscle tone.     Deep Tendon Reflexes: Reflexes are normal and symmetric.  Psychiatric:        Mood and Affect: Mood normal.      ED Treatments / Results  Labs (all labs ordered are listed, but only abnormal results are displayed) Labs Reviewed  CBC WITH DIFFERENTIAL/PLATELET - Abnormal; Notable for the following components:      Result Value   WBC 2.9 (*)    RBC 5.30 (*)    MCV 76.8 (*)    MCH 23.8 (*)    Platelets 127 (*)    All other components within normal limits  COMPREHENSIVE METABOLIC PANEL - Abnormal; Notable for the following components:   Sodium 129 (*)    Chloride 96 (*)    CO2 20 (*)    Glucose, Bld 338 (*)    Calcium 8.4 (*)    Albumin 2.9 (*)    AST 44 (*)    All other components within normal limits  LACTIC ACID, PLASMA - Abnormal; Notable for the following components:   Lactic Acid, Venous 2.5 (*)    All other components within normal limits  CULTURE, BLOOD (ROUTINE X 2)  CULTURE, BLOOD (ROUTINE X 2)  LACTIC ACID, PLASMA  BRAIN NATRIURETIC PEPTIDE  C-REACTIVE PROTEIN  D-DIMER, QUANTITATIVE (NOT AT [facility])  FERRITIN   LACTATE DEHYDROGENASE  PROCALCITONIN  CBC WITH DIFFERENTIAL/PLATELET  COMPREHENSIVE METABOLIC PANEL  C-REACTIVE PROTEIN  D-DIMER, QUANTITATIVE (NOT AT [facility])  FERRITIN  MAGNESIUM  PHOSPHORUS  HEMOGLOBIN A1C  ABO/RH    EKG EKG Interpretation  Date/Time:  Friday June 02 2019 20:23:40 EDT Ventricular Rate:  112 PR Interval:    QRS Duration: 84 QT Interval:  330 QTC Calculation: 451 R Axis:   31 Text Interpretation:  Sinus tachycardia Consider left atrial enlargement When compared to prior, no significant changes seen.  No STEMI Confirmed by Antony Blackbird [id number]) on 06/02/2019 9:00:03 PM   Radiology Dg Chest Portable 1 View  Result Date: 06/02/2019 CLINICAL DATA:  Cough and shortness of breath. Hypoxia. COVID-19 positive. EXAM: PORTABLE CHEST 1 VIEW COMPARISON:  05/27/2019 FINDINGS: Low lung volumes. Development of vague patchy heterogeneous lung opacities in the mid and lower lung zone predominant distribution. Heart size normal for technique. No evidence of pulmonary edema, large pleural effusion or pneumothorax. IMPRESSION: Development of vague patchy heterogeneous lung opacities in the mid and lower lung zone predominant distribution, consistent with multifocal pneumonia, particularly COVID-19 . Electronically Signed   By: Keith Rake M.D.   On: 06/02/2019 21:29    Procedures Procedures (including critical care time)  Beverly Johnson was evaluated in Emergency Department on 06/02/2019 for the symptoms described in the history of present illness. She was evaluated in the context of the global COVID-19 pandemic, which necessitated consideration that the patient might be at risk for infection with the SARS-CoV-2 virus that causes COVID-19. Institutional protocols and algorithms that pertain to the evaluation of patients at risk for COVID-19 are in a state of rapid change based on information released by regulatory bodies including the CDC and federal and state organizations.  These policies and algorithms were followed during the patient's care in the ED.   Medications Ordered in ED Medications  enoxaparin (LOVENOX) injection 40 mg (has no administration in time range)  Ipratropium-Albuterol (COMBIVENT) respimat 1 puff (  has no administration in time range)  dexamethasone (DECADRON) injection 6 mg (has no administration in time range)  guaiFENesin-dextromethorphan (ROBITUSSIN DM) 100-10 MG/5ML syrup 10 mL (has no administration in time range)  chlorpheniramine-HYDROcodone (TUSSIONEX) 10-8 MG/5ML suspension 5 mL (has no administration in time range)  vitamin C (ASCORBIC ACID) tablet 500 mg (has no administration in time range)  zinc sulfate capsule 220 mg (has no administration in time range)  acetaminophen (TYLENOL) tablet 650 mg (has no administration in time range)  ondansetron (ZOFRAN) tablet 4 mg (has no administration in time range)    Or  ondansetron (ZOFRAN) injection 4 mg (has no administration in time range)  elvitegravir-cobicistat-emtricitabine-tenofovir (GENVOYA) 150-150-200-10 MG tablet 1 tablet (has no administration in time range)  insulin aspart (novoLOG) injection 0-9 Units (has no administration in time range)  insulin aspart (novoLOG) injection 0-5 Units (has no administration in time range)  remdesivir 200 mg in sodium chloride 0.9 % 250 mL IVPB (has no administration in time range)  remdesivir 100 mg in sodium chloride 0.9 % 250 mL IVPB (has no administration in time range)  ondansetron (ZOFRAN) injection 4 mg (4 mg Intravenous Given 06/02/19 2119)  sodium chloride 0.9 % bolus 1,000 mL (1,000 mLs Intravenous New Bag/Given 06/02/19 2140)     Initial Impression / Assessment and Plan / ED Course  I have reviewed the triage vital signs and the nursing notes.  Pertinent labs & imaging results that were available during my care of the patient were reviewed by me and considered in my medical decision making (see chart for details).         Beverly Johnson is a 43 y.o. female with a past medical history significant for HIV, diabetes, and recent diagnosis of Covid several days ago who presents with shortness of breath, hypoxia, chest tightness, malaise, fever, nausea, vomiting, and diarrhea.  She reports that she has been having URI symptoms for the last week and was tested for Covid and was positive.  She reports that over the last 3 days she developed nausea vomiting and diarrhea and is feeling dehydrated she is not keeping any fluids down.  She says that she is unsure what her sugars have been doing as she has been able to take all her medications with the vomiting.  She denies any urinary symptoms but denies blood in her diarrhea.  She reports her husband has Covid as well.  She reports she is continued to have cough but denies production.  She reports some chest tightness and mild chest discomfort.  She was found to have oxygen saturations in the 80s on room air at home and is now on 2 L improving the shortness of breath.  Patient is tachycardic and tachypneic on arrival.  On exam, lungs have some coarseness but no wheezing.  Chest and abdomen nontender.  No murmur.  Legs not edematous and nontender.  Patient tachycardic around 110.  She is warm to the touch.  She is on 2 L.  Clinically I am concerned that patient has hypoxia related to her COVID-19 diagnosis.  She will need admission for this however will get x-ray to look for pneumonia as well.  Recent chart review shows that her HIV has been well controlled.  We will give nausea medicine and some fluids for the dehydration related to her vomiting and diarrhea for several days.  Anticipate admission for further management.  Patient's work-up reveals she does have elevated lactic acid.  White count is slightly low and  x-ray does show bilateral opacities.  Will speak with medicine team to discuss antibiotics as her symptoms are likely Covid related.  Medicine team will also hold on  antibiotics and will admit for Covid management.   Final Clinical Impressions(s) / ED Diagnoses   Final diagnoses:  COVID-19 virus infection  Hypoxia  Cough    ED Discharge Orders    None      Clinical Impression: 1. COVID-19 virus infection   2. Hypoxia   3. Cough     Disposition: Admit  This note was prepared with assistance of Dragon voice recognition software. Occasional wrong-word or sound-a-like substitutions may have occurred due to the inherent limitations of voice recognition software.      , Gwenyth Allegra, MD 06/03/19 0005

## 2019-06-03 ENCOUNTER — Other Ambulatory Visit: Payer: Self-pay

## 2019-06-03 ENCOUNTER — Encounter (HOSPITAL_COMMUNITY): Payer: Self-pay

## 2019-06-03 DIAGNOSIS — U071 COVID-19: Principal | ICD-10-CM

## 2019-06-03 DIAGNOSIS — R0902 Hypoxemia: Secondary | ICD-10-CM

## 2019-06-03 DIAGNOSIS — B20 Human immunodeficiency virus [HIV] disease: Secondary | ICD-10-CM

## 2019-06-03 DIAGNOSIS — J069 Acute upper respiratory infection, unspecified: Secondary | ICD-10-CM

## 2019-06-03 DIAGNOSIS — E1165 Type 2 diabetes mellitus with hyperglycemia: Secondary | ICD-10-CM

## 2019-06-03 LAB — FERRITIN: Ferritin: 53 ng/mL (ref 11–307)

## 2019-06-03 LAB — CBC WITH DIFFERENTIAL/PLATELET
Abs Immature Granulocytes: 0.01 10*3/uL (ref 0.00–0.07)
Basophils Absolute: 0 10*3/uL (ref 0.0–0.1)
Basophils Relative: 0 %
Eosinophils Absolute: 0 10*3/uL (ref 0.0–0.5)
Eosinophils Relative: 0 %
HCT: 38.9 % (ref 36.0–46.0)
Hemoglobin: 11.6 g/dL — ABNORMAL LOW (ref 12.0–15.0)
Immature Granulocytes: 0 %
Lymphocytes Relative: 32 %
Lymphs Abs: 0.7 10*3/uL (ref 0.7–4.0)
MCH: 22.8 pg — ABNORMAL LOW (ref 26.0–34.0)
MCHC: 29.8 g/dL — ABNORMAL LOW (ref 30.0–36.0)
MCV: 76.4 fL — ABNORMAL LOW (ref 80.0–100.0)
Monocytes Absolute: 0.1 10*3/uL (ref 0.1–1.0)
Monocytes Relative: 4 %
Neutro Abs: 1.4 10*3/uL — ABNORMAL LOW (ref 1.7–7.7)
Neutrophils Relative %: 64 %
Platelets: 118 10*3/uL — ABNORMAL LOW (ref 150–400)
RBC: 5.09 MIL/uL (ref 3.87–5.11)
RDW: 14 % (ref 11.5–15.5)
WBC: 2.3 10*3/uL — ABNORMAL LOW (ref 4.0–10.5)
nRBC: 0 % (ref 0.0–0.2)

## 2019-06-03 LAB — MAGNESIUM: Magnesium: 1.7 mg/dL (ref 1.7–2.4)

## 2019-06-03 LAB — COMPREHENSIVE METABOLIC PANEL
ALT: 20 U/L (ref 0–44)
AST: 45 U/L — ABNORMAL HIGH (ref 15–41)
Albumin: 2.9 g/dL — ABNORMAL LOW (ref 3.5–5.0)
Alkaline Phosphatase: 78 U/L (ref 38–126)
Anion gap: 13 (ref 5–15)
BUN: 9 mg/dL (ref 6–20)
CO2: 17 mmol/L — ABNORMAL LOW (ref 22–32)
Calcium: 8.5 mg/dL — ABNORMAL LOW (ref 8.9–10.3)
Chloride: 103 mmol/L (ref 98–111)
Creatinine, Ser: 0.61 mg/dL (ref 0.44–1.00)
GFR calc Af Amer: >60 mL/min (ref >60)
GFR calc non Af Amer: >60 mL/min (ref >60)
Glucose, Bld: 319 mg/dL — ABNORMAL HIGH (ref 70–99)
Potassium: 4.5 mmol/L (ref 3.5–5.1)
Sodium: 133 mmol/L — ABNORMAL LOW (ref 135–145)
Total Bilirubin: 0.5 mg/dL (ref 0.3–1.2)
Total Protein: 7.9 g/dL (ref 6.5–8.1)

## 2019-06-03 LAB — D-DIMER, QUANTITATIVE: D-Dimer, Quant: 1.46 ug/mL-FEU — ABNORMAL HIGH (ref 0.00–0.50)

## 2019-06-03 LAB — GLUCOSE, CAPILLARY
Glucose-Capillary: 272 mg/dL — ABNORMAL HIGH (ref 70–99)
Glucose-Capillary: 298 mg/dL — ABNORMAL HIGH (ref 70–99)
Glucose-Capillary: 287 mg/dL — ABNORMAL HIGH (ref 70–99)

## 2019-06-03 LAB — HEMOGLOBIN A1C
Hgb A1c MFr Bld: 13.2 % — ABNORMAL HIGH (ref 4.8–5.6)
Mean Plasma Glucose: 332.14 mg/dL

## 2019-06-03 LAB — CBG MONITORING, ED: Glucose-Capillary: 242 mg/dL — ABNORMAL HIGH (ref 70–99)

## 2019-06-03 LAB — PHOSPHORUS: Phosphorus: 3.2 mg/dL (ref 2.5–4.6)

## 2019-06-03 LAB — ABO/RH: ABO/RH(D): B POS

## 2019-06-03 LAB — BRAIN NATRIURETIC PEPTIDE: B Natriuretic Peptide: 10.2 pg/mL (ref 0.0–100.0)

## 2019-06-03 LAB — PROCALCITONIN: Procalcitonin: <0.1 ng/mL

## 2019-06-03 LAB — C-REACTIVE PROTEIN: CRP: 6.2 mg/dL — ABNORMAL HIGH (ref <1.0)

## 2019-06-03 LAB — LACTATE DEHYDROGENASE: LDH: 346 U/L — ABNORMAL HIGH (ref 98–192)

## 2019-06-03 MED ORDER — INFLUENZA VAC SPLIT QUAD 0.5 ML IM SUSY
0.5000 mL | PREFILLED_SYRINGE | INTRAMUSCULAR | Status: DC
  Filled 2019-06-03: qty 0.5

## 2019-06-03 MED ORDER — PNEUMOCOCCAL VAC POLYVALENT 25 MCG/0.5ML IJ INJ: 0.5000 mL | INJECTION | INTRAMUSCULAR | Status: DC

## 2019-06-03 MED ORDER — ENOXAPARIN SODIUM 60 MG/0.6ML ~~LOC~~ SOLN
60.0000 mg | SUBCUTANEOUS | Status: DC
  Administered 2019-06-03 – 2019-06-06 (×4): 60 mg via SUBCUTANEOUS
  Filled 2019-06-03 (×5): qty 0.6

## 2019-06-03 MED ORDER — CALCIUM CARBONATE ANTACID 500 MG PO CHEW
1.0000 | CHEWABLE_TABLET | Freq: Three times a day (TID) | ORAL | Status: DC
  Administered 2019-06-03 – 2019-06-06 (×11): 200 mg via ORAL
  Filled 2019-06-03 (×18): qty 1

## 2019-06-03 NOTE — ED Notes (Signed)
Unsuccessful attempt to give report  rn to call me back

## 2019-06-03 NOTE — ED Notes (Addendum)
Pt called out stating she needed to use the bathroom. Bedside commode placed at bedside.

## 2019-06-03 NOTE — ED Notes (Signed)
Transfer will be tomorrow sometime

## 2019-06-03 NOTE — ED Notes (Signed)
[facility]  Here

## 2019-06-03 NOTE — ED Notes (Signed)
[facility] on the way to get this pt and take her to [facility]

## 2019-06-03 NOTE — Progress Notes (Signed)
PROGRESS NOTE  Beverly Johnson [id number] DOB: [birth year] DOA: 06/02/2019 PCP: Thayer Headings, MD   LOS: 1 day   Brief Narrative / Interim history: Beverly Johnson is a 43 y.o. female with medical history significant for HIV, type 2 diabetes, and obesity who presents to the ED for evaluation of shortness of breath. Patient states she had new onset of shortness of breath with dry cough beginning 05/26/2019.  She went to urgent care and had a SARS-CoV-2 test performed which resulted positive on 05/29/2019.  She has been having progressive shortness of breath with exertion and at rest, chills, diaphoresis, nausea, vomiting, and diarrhea.  She has had difficulty maintaining adequate oral intake and reports inability to keep anything down other than ice chips.  She has had some lightheadedness and dizziness without syncope.  She has had mild generalized body aches.  Subjective / 24h Interval events: -feels about the same, still complains of shortness of breath and weakness  Assessment & Plan: Principal Problem:   Acute respiratory disease due to COVID-19 virus Active Problems:   Human immunodeficiency virus (HIV) disease ([place])   Uncontrolled type 2 diabetes mellitus ([place])   Principal Problem Acute Hypoxic Respiratory Failure due to Covid-19 Viral Illness -Continue supplemental oxygen as needed to maintain O2 sats above 88% -Patient started on Remdesivir, continue for 5 days -Patient started on dexamethasone, continue for 10 days -Continue Combivent, I-S, flutter valve, antitussives, vitamin C, zinc -CRP 6.2, continue to monitor  COVID-19 Labs  Recent Labs    06/03/19 0730  DDIMER 1.46*  FERRITIN 53  LDH 346*  CRP 6.2*    Lab Results  Component Value Date   SARSCOV2NAA DETECTED (A) 05/26/2019   SARSCOV2NAA NOT DETECTED 03/03/2019   The treatment plan and use of medications and known side effects were discussed with patient/family, they were clearly explained that there is  no proven definitive treatment for COVID-19 infection, any medications used here are based on published clinical articles/anecdotal data which are not peer-reviewed or randomized control trials.  Complete risks and long-term side effects are unknown, however in the best clinical judgment they seem to be of some clinical benefit rather than medical risks.  Patient agree with the treatment plan and want to receive the given medications.  Active Problems HIV -Labs from March 2020 showed normally viral load less than 20 and CD4 of 600 -Continue home medications  Type 2 diabetes mellitus -Hold home medications, metformin, Januvia -Start sliding scale, adjust as needed.  A1c pending.  Obesity -Would benefit from weight loss  Scheduled Meds: . calcium carbonate  1 tablet Oral TID WC  . dexamethasone (DECADRON) injection  6 mg Intravenous Q24H  . elvitegravir-cobicistat-emtricitabine-tenofovir  1 tablet Oral Q breakfast  . enoxaparin (LOVENOX) injection  60 mg Subcutaneous Q24H  . insulin aspart  0-5 Units Subcutaneous QHS  . insulin aspart  0-9 Units Subcutaneous TID WC  . Ipratropium-Albuterol  1 puff Inhalation Q6H  . vitamin C  500 mg Oral Daily  . zinc sulfate  220 mg Oral Daily   Continuous Infusions: . remdesivir 100 mg in NS 250 mL     PRN Meds:.acetaminophen, chlorpheniramine-HYDROcodone, guaiFENesin-dextromethorphan, ondansetron **OR** ondansetron (ZOFRAN) IV  DVT prophylaxis: Lovenox Code Status: Full code Family Communication: d/w patient  Disposition Plan: home when ready   Consultants:  None   Procedures:  None   Microbiology: None   Antimicrobials: None    Objective: Vitals:   06/03/19 0333 06/03/19 0400 06/03/19 0500 06/03/19 0709  BP: 127/83  115/79 109/75  Pulse: (!) 111   98  Resp: (!) 22   (!) 25  Temp: 99.3 F (37.4 C) 99.4 F (37.4 C) 99.9 F (37.7 C) 97.7 F (36.5 C)  TempSrc: Oral Oral Oral Oral  SpO2: 96%   97%  Weight: 113 kg     Height:  5\' 7"  (1.702 m)       Intake/Output Summary (Last 24 hours) at 06/03/2019 1246 Last data filed at 06/03/2019 0600 Gross per 24 hour  Intake 1546.6 ml  Output -  Net 1546.6 ml   Filed Weights   06/02/19 2016 06/03/19 0333  Weight: 113.4 kg 113 kg    Examination:  Constitutional: NAD Eyes: PERRL, lids and conjunctivae normal ENMT: Mucous membranes are moist. No oropharyngeal exudates Neck: normal, supple, no masses, no thyromegaly Respiratory: Shallow respirations but overall clear, no wheezing, no crackles, tachypneic Cardiovascular: Regular rate and rhythm, no murmurs / rubs / gallops. No LE edema. 2+ pedal pulses. Abdomen: no tenderness. Bowel sounds positive.  Musculoskeletal: no clubbing / cyanosis.  Skin: no rashes Neurologic: CN 2-12 grossly intact. Strength 5/5 in all 4.  Psychiatric: Normal judgment and insight. Alert and oriented x 3. Normal mood.    Data Reviewed: I have independently reviewed following labs and imaging studies   CBC: Recent Labs  Lab 05/27/19 1421 06/02/19 2053 06/03/19 0730  WBC 3.0* 2.9* 2.3*  NEUTROABS 1.6* 1.7 1.4*  HGB 11.4* 12.6 11.6*  HCT 37.6 40.7 38.9  MCV 77.4* 76.8* 76.4*  PLT 136* 127* 123456*   Basic Metabolic Panel: Recent Labs  Lab 05/27/19 1421 06/02/19 2053 06/03/19 0730  NA 131* 129* 133*  K 3.8 3.9 4.5  CL 100 96* 103  CO2 20* 20* 17*  GLUCOSE 316* 338* 319*  BUN 5* 8 9  CREATININE 0.68 0.82 0.61  CALCIUM 8.8* 8.4* 8.5*  MG  --   --  1.7  PHOS  --   --  3.2   GFR: Estimated Creatinine Clearance: 120.1 mL/min (by C-G formula based on SCr of 0.61 mg/dL). Liver Function Tests: Recent Labs  Lab 05/27/19 1421 06/02/19 2053 06/03/19 0730  AST 54* 44* 45*  ALT 28 22 20   ALKPHOS 90 86 78  BILITOT 0.2* 0.6 0.5  PROT 8.1 8.1 7.9  ALBUMIN 3.3* 2.9* 2.9*   Recent Labs  Lab 05/27/19 1421  LIPASE 45   No results for input(s): AMMONIA in the last 168 hours. Coagulation Profile: No results for input(s):  INR, PROTIME in the last 168 hours. Cardiac Enzymes: No results for input(s): CKTOTAL, CKMB, CKMBINDEX, TROPONINI in the last 168 hours. BNP (last 3 results) No results for input(s): PROBNP in the last 8760 hours. HbA1C: Recent Labs    06/03/19 0730  HGBA1C 13.2*   CBG: Recent Labs  Lab 06/03/19 0059 06/03/19 1212  GLUCAP 242* 272*   Lipid Profile: No results for input(s): CHOL, HDL, LDLCALC, TRIG, CHOLHDL, LDLDIRECT in the last 72 hours. Thyroid Function Tests: No results for input(s): TSH, T4TOTAL, FREET4, T3FREE, THYROIDAB in the last 72 hours. Anemia Panel: Recent Labs    06/03/19 0730  FERRITIN 53   Urine analysis:    Component Value Date/Time   COLORURINE YELLOW 09/08/2018 0009   APPEARANCEUR CLOUDY (A) 09/08/2018 0009   LABSPEC 1.015 05/26/2019 1137   PHURINE 6.0 05/26/2019 1137   GLUCOSEU 500 (A) 05/26/2019 1137   GLUCOSEU NEG mg/dL 09/17/2006 2003   HGBUR NEGATIVE 05/26/2019 1137   BILIRUBINUR NEGATIVE 05/26/2019  [place] 05/26/2019 [place] 05/26/2019 1137   UROBILINOGEN 0.2 05/26/2019 1137   NITRITE NEGATIVE 05/26/2019 [place] 05/26/2019 1137   Sepsis Labs: Invalid input(s): PROCALCITONIN, LACTICIDVEN  Recent Results (from the past 240 hour(s))  Novel Coronavirus, NAA (Hosp order, Send-out to Ref Lab; TAT 18-24 hrs     Status: Abnormal   Collection Time: 05/26/19 11:10 AM   Specimen: Nasopharyngeal Swab; Respiratory  Result Value Ref Range Status   SARS-CoV-2, NAA DETECTED (A) NOT DETECTED Final    Comment: (NOTE)                  Client Requested Flag This nucleic acid amplification test was developed and its performance characteristics determined by [employer]. Nucleic acid amplification tests include PCR and TMA. This test has not been FDA cleared or approved. This test has been authorized by FDA under an Emergency Use Authorization (EUA). This test is only authorized for the  duration of time the declaration that circumstances exist justifying the authorization of the emergency use of in vitro diagnostic tests for detection of SARS-CoV-2 virus and/or diagnosis of COVID-19 infection under section 564(b)(1) of the Act, 21 U.S.C. [id number]) (1), unless the authorization is terminated or revoked sooner. When diagnostic testing is negative, the possibility of a false negative result should be considered in the context of a patient's recent exposures and the presence of clinical signs and symptoms consistent with COVID-19. An individual without symptoms of COVID-  19 and who is not shedding SARS-CoV-2 virus would expect to have a negative (not detected) result in this assay. Performed At: [facility] [address] [place], [place] [id number] Rush Farmer MD [id number]    [place]  Final    Comment: Performed at [facility], [place] [address], [place], [place] [address]  Blood culture (routine x 2)     Status: None (Preliminary result)   Collection Time: 06/02/19 10:07 PM   Specimen: BLOOD  Result Value Ref Range Status   Specimen Description BLOOD LEFT ANTECUBITAL  Final   Special Requests   Final    BOTTLES DRAWN AEROBIC AND ANAEROBIC Blood Culture adequate volume   Culture   Final    NO GROWTH < 12 HOURS Performed at [facility], [place] [address], [place], [place] [address]    Report Status PENDING  Incomplete      Radiology Studies: Dg Chest Portable 1 View  Result Date: 06/02/2019 CLINICAL DATA:  Cough and shortness of breath. Hypoxia. COVID-19 positive. EXAM: PORTABLE CHEST 1 VIEW COMPARISON:  05/27/2019 FINDINGS: Low lung volumes. Development of vague patchy heterogeneous lung opacities in the mid and lower lung zone predominant distribution. Heart size normal for technique. No evidence of pulmonary edema, large pleural effusion or pneumothorax. IMPRESSION: Development of vague patchy heterogeneous  lung opacities in the mid and lower lung zone predominant distribution, consistent with multifocal pneumonia, particularly COVID-19 . Electronically Signed   By: Keith Rake M.D.   On: 06/02/2019 21:29    Marzetta Board, MD, PhD [facility]  Contact via  [email or web address]  [place] P: [phone] F: [phone]

## 2019-06-03 NOTE — ED Notes (Signed)
Called [facility] for transport to [facility]

## 2019-06-03 NOTE — ED Notes (Signed)
[facility] can't transport pt, called [facility] and is on their list

## 2019-06-04 LAB — COMPREHENSIVE METABOLIC PANEL
ALT: 20 U/L (ref 0–44)
AST: 36 U/L (ref 15–41)
Albumin: 2.7 g/dL — ABNORMAL LOW (ref 3.5–5.0)
Alkaline Phosphatase: 85 U/L (ref 38–126)
Anion gap: 11 (ref 5–15)
BUN: 14 mg/dL (ref 6–20)
CO2: 19 mmol/L — ABNORMAL LOW (ref 22–32)
Calcium: 8.9 mg/dL (ref 8.9–10.3)
Chloride: 101 mmol/L (ref 98–111)
Creatinine, Ser: 0.61 mg/dL (ref 0.44–1.00)
GFR calc Af Amer: >60 mL/min (ref >60)
GFR calc non Af Amer: >60 mL/min (ref >60)
Glucose, Bld: 325 mg/dL — ABNORMAL HIGH (ref 70–99)
Potassium: 4.6 mmol/L (ref 3.5–5.1)
Sodium: 131 mmol/L — ABNORMAL LOW (ref 135–145)
Total Bilirubin: 0.4 mg/dL (ref 0.3–1.2)
Total Protein: 7.6 g/dL (ref 6.5–8.1)

## 2019-06-04 LAB — CBC
HCT: 36.3 % (ref 36.0–46.0)
Hemoglobin: 11.1 g/dL — ABNORMAL LOW (ref 12.0–15.0)
MCH: 22.9 pg — ABNORMAL LOW (ref 26.0–34.0)
MCHC: 30.6 g/dL (ref 30.0–36.0)
MCV: 75 fL — ABNORMAL LOW (ref 80.0–100.0)
Platelets: 160 10*3/uL (ref 150–400)
RBC: 4.84 MIL/uL (ref 3.87–5.11)
RDW: 14 % (ref 11.5–15.5)
WBC: 1.9 10*3/uL — ABNORMAL LOW (ref 4.0–10.5)
nRBC: 0 % (ref 0.0–0.2)

## 2019-06-04 LAB — GLUCOSE, CAPILLARY
Glucose-Capillary: 354 mg/dL — ABNORMAL HIGH (ref 70–99)
Glucose-Capillary: 344 mg/dL — ABNORMAL HIGH (ref 70–99)
Glucose-Capillary: 379 mg/dL — ABNORMAL HIGH (ref 70–99)

## 2019-06-04 LAB — D-DIMER, QUANTITATIVE: D-Dimer, Quant: 1.7 ug/mL-FEU — ABNORMAL HIGH (ref 0.00–0.50)

## 2019-06-04 LAB — C-REACTIVE PROTEIN: CRP: 3.2 mg/dL — ABNORMAL HIGH (ref <1.0)

## 2019-06-04 MED ORDER — INFLUENZA VAC SPLIT QUAD 0.5 ML IM SUSY
0.5000 mL | PREFILLED_SYRINGE | INTRAMUSCULAR | Status: DC
  Filled 2019-06-04: qty 0.5

## 2019-06-04 MED ORDER — INSULIN DETEMIR 100 UNIT/ML ~~LOC~~ SOLN
10.0000 [IU] | Freq: Two times a day (BID) | SUBCUTANEOUS | Status: DC
  Administered 2019-06-04 – 2019-06-05 (×3): 10 [IU] via SUBCUTANEOUS
  Filled 2019-06-04 (×3): qty 0.1

## 2019-06-04 MED ORDER — INSULIN ASPART 100 UNIT/ML ~~LOC~~ SOLN
3.0000 [IU] | Freq: Three times a day (TID) | SUBCUTANEOUS | Status: DC
  Administered 2019-06-04 – 2019-06-05 (×4): 3 [IU] via SUBCUTANEOUS

## 2019-06-04 NOTE — Progress Notes (Signed)
PROGRESS NOTE  Beverly Johnson [id number] DOB: [birth year] DOA: 06/02/2019 PCP: Thayer Headings, MD   LOS: 2 days   Brief Narrative / Interim history: Beverly Johnson is a 43 y.o. female with medical history significant for HIV, type 2 diabetes, and obesity who presents to the ED for evaluation of shortness of breath. Patient states she had new onset of shortness of breath with dry cough beginning 05/26/2019.  She went to urgent care and had a SARS-CoV-2 test performed which resulted positive on 05/29/2019.  She has been having progressive shortness of breath with exertion and at rest, chills, diaphoresis, nausea, vomiting, and diarrhea.  She has had difficulty maintaining adequate oral intake and reports inability to keep anything down other than ice chips.  She has had some lightheadedness and dizziness without syncope.  She has had mild generalized body aches.  Subjective / 24h Interval events: -Feeling better, shortness of breath is improved.  No overnight events, she is on room air this morning  Assessment & Plan: Principal Problem:   Acute respiratory disease due to COVID-19 virus Active Problems:   Human immunodeficiency virus (HIV) disease ([place])   Uncontrolled type 2 diabetes mellitus ([place])   Principal Problem Acute Hypoxic Respiratory Failure due to Covid-19 Viral Illness -Continue supplemental oxygen as needed to maintain O2 sats above 88%, currently on room air -Patient started on Remdesivir, continue for 5 days with last day on 10/20 -Patient started on dexamethasone, continue for 10 days -Continue Combivent, I-S, flutter valve, antitussives, vitamin C, zinc -CRP improving  COVID-19 Labs  Recent Labs    06/03/19 0730 06/04/19 0446  DDIMER 1.46* 1.70*  FERRITIN 53  --   LDH 346*  --   CRP 6.2* 3.2*    Lab Results  Component Value Date   SARSCOV2NAA DETECTED (A) 05/26/2019   SARSCOV2NAA NOT DETECTED 03/03/2019   The treatment plan and use of medications and  known side effects were discussed with patient/family, they were clearly explained that there is no proven definitive treatment for COVID-19 infection, any medications used here are based on published clinical articles/anecdotal data which are not peer-reviewed or randomized control trials.  Complete risks and long-term side effects are unknown, however in the best clinical judgment they seem to be of some clinical benefit rather than medical risks.  Patient agree with the treatment plan and want to receive the given medications.  Active Problems HIV -Labs from March 2020 showed normally viral load less than 20 and CD4 of 600 -Continue home medications  Type 2 diabetes mellitus, poorly controlled with hyperglycemia -Hold home medications, metformin, Januvia -Start sliding scale, adjust as needed.  A1c 13.2, add Levemir and increase scheduled mealtime for poorly controlled fasting and daytime CBGs  CBG (last 3)  Recent Labs    06/03/19 1614 06/03/19 2148 06/04/19 0706  GLUCAP 298* 287* 354*     Obesity -Would benefit from weight loss  Scheduled Meds: . calcium carbonate  1 tablet Oral TID WC  . dexamethasone (DECADRON) injection  6 mg Intravenous Q24H  . elvitegravir-cobicistat-emtricitabine-tenofovir  1 tablet Oral Q breakfast  . enoxaparin (LOVENOX) injection  60 mg Subcutaneous Q24H  . [START ON 06/07/2019] influenza vac split quadrivalent PF  0.5 mL Intramuscular Tomorrow-1000  . insulin aspart  0-5 Units Subcutaneous QHS  . insulin aspart  0-9 Units Subcutaneous TID WC  . insulin aspart  3 Units Subcutaneous TID WC  . insulin detemir  10 Units Subcutaneous BID  . Ipratropium-Albuterol  1 puff  Inhalation Q6H  . [START ON 06/09/2019] pneumococcal 23 valent vaccine  0.5 mL Intramuscular Tomorrow-1000  . vitamin C  500 mg Oral Daily  . zinc sulfate  220 mg Oral Daily   Continuous Infusions: . remdesivir 100 mg in NS 250 mL 100 mg (06/03/19 1649)   PRN Meds:.acetaminophen,  chlorpheniramine-HYDROcodone, guaiFENesin-dextromethorphan, ondansetron **OR** ondansetron (ZOFRAN) IV  DVT prophylaxis: Lovenox Code Status: Full code Family Communication: d/w patient  Disposition Plan: home when ready   Consultants:  None   Procedures:  None   Microbiology: None   Antimicrobials: None    Objective: Vitals:   06/03/19 2209 06/03/19 2358 06/04/19 0254 06/04/19 0712  BP:  124/82  115/79  Pulse: 99 96  95  Resp: 19 (!) 26 18 19   Temp:  98.6 F (37 C) 97.8 F (36.6 C) (!) 97 F (36.1 C)  TempSrc:  Oral Oral Oral  SpO2: 97% 96%  94%  Weight:      Height:        Intake/Output Summary (Last 24 hours) at 06/04/2019 1052 Last data filed at 06/04/2019 0500 Gross per 24 hour  Intake 610 ml  Output -  Net 610 ml   Filed Weights   06/02/19 2016 06/03/19 0333  Weight: 113.4 kg 113 kg    Examination:  Constitutional: No distress Eyes: No scleral icterus ENMT: Moist mucous membranes Respiratory: Diminished at the bases, shallow respiration but overall clear without wheezing or crackles Cardiovascular: Regular rate and rhythm, no murmurs, no peripheral edema Abdomen: Soft, nondistended, nontender, positive bowel sounds Musculoskeletal: no clubbing / cyanosis.  Skin: No rashes seen Neurologic: No focal deficits   Data Reviewed: I have independently reviewed following labs and imaging studies   CBC: Recent Labs  Lab 06/02/19 2053 06/03/19 0730 06/04/19 0446  WBC 2.9* 2.3* 1.9*  NEUTROABS 1.7 1.4*  --   HGB 12.6 11.6* 11.1*  HCT 40.7 38.9 36.3  MCV 76.8* 76.4* 75.0*  PLT 127* 118* 0000000   Basic Metabolic Panel: Recent Labs  Lab 06/02/19 2053 06/03/19 0730 06/04/19 0446  NA 129* 133* 131*  K 3.9 4.5 4.6  CL 96* 103 101  CO2 20* 17* 19*  GLUCOSE 338* 319* 325*  BUN 8 9 14   CREATININE 0.82 0.61 0.61  CALCIUM 8.4* 8.5* 8.9  MG  --  1.7  --   PHOS  --  3.2  --    GFR: Estimated Creatinine Clearance: 120.1 mL/min (by C-G formula  based on SCr of 0.61 mg/dL). Liver Function Tests: Recent Labs  Lab 06/02/19 2053 06/03/19 0730 06/04/19 0446  AST 44* 45* 36  ALT 22 20 20   ALKPHOS 86 78 85  BILITOT 0.6 0.5 0.4  PROT 8.1 7.9 7.6  ALBUMIN 2.9* 2.9* 2.7*   No results for input(s): LIPASE, AMYLASE in the last 168 hours. No results for input(s): AMMONIA in the last 168 hours. Coagulation Profile: No results for input(s): INR, PROTIME in the last 168 hours. Cardiac Enzymes: No results for input(s): CKTOTAL, CKMB, CKMBINDEX, TROPONINI in the last 168 hours. BNP (last 3 results) No results for input(s): PROBNP in the last 8760 hours. HbA1C: Recent Labs    06/03/19 0730  HGBA1C 13.2*   CBG: Recent Labs  Lab 06/03/19 0059 06/03/19 1212 06/03/19 1614 06/03/19 2148 06/04/19 0706  GLUCAP 242* 272* 298* 287* 354*   Lipid Profile: No results for input(s): CHOL, HDL, LDLCALC, TRIG, CHOLHDL, LDLDIRECT in the last 72 hours. Thyroid Function Tests: No results for input(s): TSH,  T4TOTAL, FREET4, T3FREE, THYROIDAB in the last 72 hours. Anemia Panel: Recent Labs    06/03/19 0730  FERRITIN 53   Urine analysis:    Component Value Date/Time   COLORURINE YELLOW 09/08/2018 0009   APPEARANCEUR CLOUDY (A) 09/08/2018 0009   LABSPEC 1.015 05/26/2019 1137   PHURINE 6.0 05/26/2019 1137   GLUCOSEU 500 (A) 05/26/2019 1137   GLUCOSEU NEG mg/dL 09/17/2006 2003   HGBUR NEGATIVE 05/26/2019 1137   BILIRUBINUR NEGATIVE 05/26/2019 1137   KETONESUR NEGATIVE 05/26/2019 1137   PROTEINUR NEGATIVE 05/26/2019 1137   UROBILINOGEN 0.2 05/26/2019 1137   NITRITE NEGATIVE 05/26/2019 1137   LEUKOCYTESUR NEGATIVE 05/26/2019 1137   Sepsis Labs: Invalid input(s): PROCALCITONIN, LACTICIDVEN  Recent Results (from the past 240 hour(s))  Novel Coronavirus, NAA (Hosp order, Send-out to Ref Lab; TAT 18-24 hrs     Status: Abnormal   Collection Time: 05/26/19 11:10 AM   Specimen: Nasopharyngeal Swab; Respiratory  Result Value Ref Range  Status   SARS-CoV-2, NAA DETECTED (A) NOT DETECTED Final    Comment: (NOTE)                  Client Requested Flag This nucleic acid amplification test was developed and its performance characteristics determined by [employer]. Nucleic acid amplification tests include PCR and TMA. This test has not been FDA cleared or approved. This test has been authorized by FDA under an Emergency Use Authorization (EUA). This test is only authorized for the duration of time the declaration that circumstances exist justifying the authorization of the emergency use of in vitro diagnostic tests for detection of SARS-CoV-2 virus and/or diagnosis of COVID-19 infection under section 564(b)(1) of the Act, 21 U.S.C. [id number]) (1), unless the authorization is terminated or revoked sooner. When diagnostic testing is negative, the possibility of a false negative result should be considered in the context of a patient's recent exposures and the presence of clinical signs and symptoms consistent with COVID-19. An individual without symptoms of COVID-  19 and who is not shedding SARS-CoV-2 virus would expect to have a negative (not detected) result in this assay. Performed At: [facility] [address] [place], [place] [id number] Rush Farmer MD [id number]    [place]  Final    Comment: Performed at [facility], [place] [address], [place], [place] [address]  Blood culture (routine x 2)     Status: None (Preliminary result)   Collection Time: 06/02/19 10:07 PM   Specimen: BLOOD  Result Value Ref Range Status   Specimen Description BLOOD LEFT ANTECUBITAL  Final   Special Requests   Final    BOTTLES DRAWN AEROBIC AND ANAEROBIC Blood Culture adequate volume   Culture   Final    NO GROWTH 2 DAYS Performed at [facility], [place] [address], [place], [place] [address]    Report Status PENDING  Incomplete  Blood culture (routine x 2)     Status: None  (Preliminary result)   Collection Time: 06/03/19  7:30 AM   Specimen: BLOOD  Result Value Ref Range Status   Specimen Description   Final    BLOOD RIGHT ARM Performed at [facility] [address] [place], [place] [address]    Special Requests   Final    BOTTLES DRAWN AEROBIC ONLY Blood Culture adequate volume Performed at [facility] [address], [place], [place] [address]    Culture   Final    NO GROWTH < 12  HOURS Performed at [facility], [place] [address], [place],  [address]    Report Status PENDING  Incomplete      Radiology Studies: Dg Chest Portable 1 View  Result Date: 06/02/2019 CLINICAL DATA:  Cough and shortness of breath. Hypoxia. COVID-19 positive. EXAM: PORTABLE CHEST 1 VIEW COMPARISON:  05/27/2019 FINDINGS: Low lung volumes. Development of vague patchy heterogeneous lung opacities in the mid and lower lung zone predominant distribution. Heart size normal for technique. No evidence of pulmonary edema, large pleural effusion or pneumothorax. IMPRESSION: Development of vague patchy heterogeneous lung opacities in the mid and lower lung zone predominant distribution, consistent with multifocal pneumonia, particularly COVID-19 . Electronically Signed   By: Keith Rake M.D.   On: 06/02/2019 21:29    Marzetta Board, MD, PhD [facility]  Contact via  [email or web address]  [place] P: [phone] F: [phone]

## 2019-06-04 NOTE — Progress Notes (Signed)
Patient has MEWS score of yellow due to heart rate and resp. Patient gets short of breath with exertion but is refusing to wear oxygen stating she can not sleep with it on. Notified patient that vitals will be taken q 2hrs and if sats drop oxygen may be required. Patient states understanding.

## 2019-06-05 LAB — D-DIMER, QUANTITATIVE: D-Dimer, Quant: 0.65 ug/mL-FEU — ABNORMAL HIGH (ref 0.00–0.50)

## 2019-06-05 LAB — GLUCOSE, CAPILLARY
Glucose-Capillary: 315 mg/dL — ABNORMAL HIGH (ref 70–99)
Glucose-Capillary: 307 mg/dL — ABNORMAL HIGH (ref 70–99)
Glucose-Capillary: 304 mg/dL — ABNORMAL HIGH (ref 70–99)
Glucose-Capillary: 422 mg/dL — ABNORMAL HIGH (ref 70–99)
Glucose-Capillary: 352 mg/dL — ABNORMAL HIGH (ref 70–99)
Glucose-Capillary: 295 mg/dL — ABNORMAL HIGH (ref 70–99)

## 2019-06-05 LAB — COMPREHENSIVE METABOLIC PANEL
ALT: 20 U/L (ref 0–44)
AST: 29 U/L (ref 15–41)
Albumin: 3.1 g/dL — ABNORMAL LOW (ref 3.5–5.0)
Alkaline Phosphatase: 87 U/L (ref 38–126)
Anion gap: 12 (ref 5–15)
BUN: 19 mg/dL (ref 6–20)
CO2: 19 mmol/L — ABNORMAL LOW (ref 22–32)
Calcium: 9.6 mg/dL (ref 8.9–10.3)
Chloride: 101 mmol/L (ref 98–111)
Creatinine, Ser: 0.67 mg/dL (ref 0.44–1.00)
GFR calc Af Amer: >60 mL/min (ref >60)
GFR calc non Af Amer: >60 mL/min (ref >60)
Glucose, Bld: 358 mg/dL — ABNORMAL HIGH (ref 70–99)
Potassium: 4.4 mmol/L (ref 3.5–5.1)
Sodium: 132 mmol/L — ABNORMAL LOW (ref 135–145)
Total Bilirubin: 0.9 mg/dL (ref 0.3–1.2)
Total Protein: 8.1 g/dL (ref 6.5–8.1)

## 2019-06-05 LAB — CBC
HCT: 36.8 % (ref 36.0–46.0)
Hemoglobin: 11.4 g/dL — ABNORMAL LOW (ref 12.0–15.0)
MCH: 23.1 pg — ABNORMAL LOW (ref 26.0–34.0)
MCHC: 31 g/dL (ref 30.0–36.0)
MCV: 74.6 fL — ABNORMAL LOW (ref 80.0–100.0)
Platelets: 183 10*3/uL (ref 150–400)
RBC: 4.93 MIL/uL (ref 3.87–5.11)
RDW: 14.1 % (ref 11.5–15.5)
WBC: 2.4 10*3/uL — ABNORMAL LOW (ref 4.0–10.5)
nRBC: 0 % (ref 0.0–0.2)

## 2019-06-05 LAB — C-REACTIVE PROTEIN: CRP: 1.8 mg/dL — ABNORMAL HIGH (ref <1.0)

## 2019-06-05 MED ORDER — INSULIN DETEMIR 100 UNIT/ML ~~LOC~~ SOLN
14.0000 [IU] | Freq: Two times a day (BID) | SUBCUTANEOUS | Status: DC
  Administered 2019-06-05 – 2019-06-06 (×2): 14 [IU] via SUBCUTANEOUS
  Filled 2019-06-05 (×3): qty 0.14

## 2019-06-05 MED ORDER — CEPASTAT 14.5 MG MT LOZG
1.0000 | LOZENGE | OROMUCOSAL | Status: DC | PRN
  Administered 2019-06-05: 1 via BUCCAL
  Filled 2019-06-05: qty 9

## 2019-06-05 MED ORDER — INSULIN ASPART 100 UNIT/ML ~~LOC~~ SOLN
0.0000 [IU] | Freq: Every day | SUBCUTANEOUS | Status: DC
  Administered 2019-06-05: 22:00:00 3 [IU] via SUBCUTANEOUS

## 2019-06-05 MED ORDER — INSULIN ASPART 100 UNIT/ML ~~LOC~~ SOLN
5.0000 [IU] | Freq: Three times a day (TID) | SUBCUTANEOUS | Status: DC
  Administered 2019-06-05 – 2019-06-06 (×3): 5 [IU] via SUBCUTANEOUS

## 2019-06-05 MED ORDER — INSULIN DETEMIR 100 UNIT/ML ~~LOC~~ SOLN
4.0000 [IU] | Freq: Once | SUBCUTANEOUS | Status: AC
  Administered 2019-06-05: 4 [IU] via SUBCUTANEOUS
  Filled 2019-06-05: qty 0.04

## 2019-06-05 MED ORDER — INSULIN ASPART 100 UNIT/ML ~~LOC~~ SOLN
0.0000 [IU] | Freq: Three times a day (TID) | SUBCUTANEOUS | Status: DC
  Administered 2019-06-05 (×2): 20 [IU] via SUBCUTANEOUS
  Administered 2019-06-06: 09:00:00 15 [IU] via SUBCUTANEOUS

## 2019-06-05 NOTE — Progress Notes (Signed)
PROGRESS NOTE  Beverly Johnson [id number] DOB: April 05, 1976 DOA: 06/02/2019 PCP: No primary care provider on file.   LOS: 3 days   Brief Narrative / Interim history: Beverly Johnson is a 43 y.o. female with medical history significant for HIV, type 2 diabetes, and obesity who presents to the ED for evaluation of shortness of breath. Patient states she had new onset of shortness of breath with dry cough beginning 05/26/2019.  She went to urgent care and had a SARS-CoV-2 test performed which resulted positive on 05/29/2019.  She has been having progressive shortness of breath with exertion and at rest, chills, diaphoresis, nausea, vomiting, and diarrhea.  She has had difficulty maintaining adequate oral intake and reports inability to keep anything down other than ice chips.  She has had some lightheadedness and dizziness without syncope.  She has had mild generalized body aches.  Subjective / 24h Interval events: -feeling better, wants to go home. She is thinking about leaving AMA  Assessment & Plan: Principal Problem:   Acute respiratory disease due to COVID-19 virus Active Problems:   Human immunodeficiency virus (HIV) disease ([place])   Uncontrolled type 2 diabetes mellitus ([place])   Principal Problem Acute Hypoxic Respiratory Failure due to Covid-19 Viral Illness -Continue supplemental oxygen as needed to maintain O2 sats above 88%, currently on room air -Patient started on Remdesivir, continue for 5 days with last day on 10/20 -Patient started on dexamethasone, continue for 10 days -Continue Combivent, I-S, flutter valve, antitussives, vitamin C, zinc -Clinically improving, plan to discharge home tomorrow  COVID-19 Labs  Recent Labs    06/03/19 0730 06/04/19 0446 06/05/19 0430  DDIMER 1.46* 1.70* 0.65*  FERRITIN 53  --   --   LDH 346*  --   --   CRP 6.2* 3.2* 1.8*    Lab Results  Component Value Date   SARSCOV2NAA DETECTED (A) 05/26/2019   SARSCOV2NAA NOT DETECTED  03/03/2019   The treatment plan and use of medications and known side effects were discussed with patient/family, they were clearly explained that there is no proven definitive treatment for COVID-19 infection, any medications used here are based on published clinical articles/anecdotal data which are not peer-reviewed or randomized control trials.  Complete risks and long-term side effects are unknown, however in the best clinical judgment they seem to be of some clinical benefit rather than medical risks.  Patient agree with the treatment plan and want to receive the given medications.  Active Problems HIV -Labs from March 2020 showed normally viral load less than 20 and CD4 of 600 -Continue home medications  Type 2 diabetes mellitus, poorly controlled with hyperglycemia -Hold home medications, metformin, Januvia -Start sliding scale, adjust as needed.  A1c 13.2, increase Levemir and scheduled mealtime again today  CBG (last 3)  Recent Labs    06/04/19 1625 06/04/19 2117 06/05/19 0815  GLUCAP 379* 307* 304*     Obesity -Would benefit from weight loss  Scheduled Meds: . calcium carbonate  1 tablet Oral TID WC  . dexamethasone (DECADRON) injection  6 mg Intravenous Q24H  . elvitegravir-cobicistat-emtricitabine-tenofovir  1 tablet Oral Q breakfast  . enoxaparin (LOVENOX) injection  60 mg Subcutaneous Q24H  . [START ON 06/07/2019] influenza vac split quadrivalent PF  0.5 mL Intramuscular Tomorrow-1000  . insulin aspart  0-5 Units Subcutaneous QHS  . insulin aspart  0-9 Units Subcutaneous TID WC  . insulin aspart  3 Units Subcutaneous TID WC  . insulin detemir  10 Units Subcutaneous BID  .  Ipratropium-Albuterol  1 puff Inhalation Q6H  . [START ON 06/09/2019] pneumococcal 23 valent vaccine  0.5 mL Intramuscular Tomorrow-1000  . vitamin C  500 mg Oral Daily  . zinc sulfate  220 mg Oral Daily   Continuous Infusions: . remdesivir 100 mg in NS 250 mL 100 mg (06/04/19 1700)   PRN  Meds:.acetaminophen, chlorpheniramine-HYDROcodone, guaiFENesin-dextromethorphan, ondansetron **OR** ondansetron (ZOFRAN) IV, phenol-menthol  DVT prophylaxis: Lovenox Code Status: Full code Family Communication: d/w patient  Disposition Plan: home when ready   Consultants:  None   Procedures:  None   Microbiology: None   Antimicrobials: None    Objective: Vitals:   06/04/19 1700 06/04/19 1900 06/05/19 0300 06/05/19 0817  BP: 119/81 118/70 118/81 116/78  Pulse: 97 89  88  Resp: 20   20  Temp: 97.9 F (36.6 C) 98 F (36.7 C) (!) 97.5 F (36.4 C) 97.9 F (36.6 C)  TempSrc: Oral Oral Oral Oral  SpO2: 93%   96%  Weight:      Height:        Intake/Output Summary (Last 24 hours) at 06/05/2019 0939 Last data filed at 06/05/2019 0000 Gross per 24 hour  Intake 240 ml  Output -  Net 240 ml   Filed Weights   06/02/19 2016 06/03/19 0333  Weight: 113.4 kg 113 kg    Examination:  Constitutional: NAD, sitting in chair Eyes: No scleral icterus ENMT: Moist membranes Respiratory: Diminished at the bases but overall clear, no wheezing, no crackles Cardiovascular: Regular rate and rhythm, no murmurs.  No edema Abdomen: Soft, nondistended, positive bowel sounds Musculoskeletal: no clubbing / cyanosis.  Skin no rashes seen Neurologic: No focal deficits   Data Reviewed: I have independently reviewed following labs and imaging studies   CBC: Recent Labs  Lab 06/02/19 2053 06/03/19 0730 06/04/19 0446 06/05/19 0430  WBC 2.9* 2.3* 1.9* 2.4*  NEUTROABS 1.7 1.4*  --   --   HGB 12.6 11.6* 11.1* 11.4*  HCT 40.7 38.9 36.3 36.8  MCV 76.8* 76.4* 75.0* 74.6*  PLT 127* 118* 160 [id number]   Basic Metabolic Panel: Recent Labs  Lab 06/02/19 2053 06/03/19 0730 06/04/19 0446 06/05/19 0430  NA 129* 133* 131* 132*  K 3.9 4.5 4.6 4.4  CL 96* 103 101 101  CO2 20* 17* 19* 19*  GLUCOSE 338* 319* 325* 358*  BUN 8 9 14 19   CREATININE 0.82 0.61 0.61 0.67  CALCIUM 8.4* 8.5* 8.9 9.6   MG  --  1.7  --   --   PHOS  --  3.2  --   --    GFR: Estimated Creatinine Clearance: 120.1 mL/min (by C-G formula based on SCr of 0.67 mg/dL). Liver Function Tests: Recent Labs  Lab 06/02/19 2053 06/03/19 0730 06/04/19 0446 06/05/19 0430  AST 44* 45* 36 29  ALT 22 20 20 20   ALKPHOS 86 78 85 87  BILITOT 0.6 0.5 0.4 0.9  PROT 8.1 7.9 7.6 8.1  ALBUMIN 2.9* 2.9* 2.7* 3.1*   No results for input(s): LIPASE, AMYLASE in the last 168 hours. No results for input(s): AMMONIA in the last 168 hours. Coagulation Profile: No results for input(s): INR, PROTIME in the last 168 hours. Cardiac Enzymes: No results for input(s): CKTOTAL, CKMB, CKMBINDEX, TROPONINI in the last 168 hours. BNP (last 3 results) No results for input(s): PROBNP in the last 8760 hours. HbA1C: Recent Labs    06/03/19 0730  HGBA1C 13.2*   CBG: Recent Labs  Lab 06/04/19 0706 06/04/19 1244 06/04/19  1625 06/04/19 2117 06/05/19 0815  GLUCAP 354* 344* 379* 307* 304*   Lipid Profile: No results for input(s): CHOL, HDL, LDLCALC, TRIG, CHOLHDL, LDLDIRECT in the last 72 hours. Thyroid Function Tests: No results for input(s): TSH, T4TOTAL, FREET4, T3FREE, THYROIDAB in the last 72 hours. Anemia Panel: Recent Labs    06/03/19 0730  FERRITIN 53   Urine analysis:    Component Value Date/Time   COLORURINE YELLOW 09/08/2018 0009   APPEARANCEUR CLOUDY (A) 09/08/2018 0009   LABSPEC 1.015 05/26/2019 1137   PHURINE 6.0 05/26/2019 1137   GLUCOSEU 500 (A) 05/26/2019 1137   GLUCOSEU NEG mg/dL 09/17/2006 2003   HGBUR NEGATIVE 05/26/2019 1137   BILIRUBINUR NEGATIVE 05/26/2019 1137   KETONESUR NEGATIVE 05/26/2019 1137   PROTEINUR NEGATIVE 05/26/2019 1137   UROBILINOGEN 0.2 05/26/2019 1137   NITRITE NEGATIVE 05/26/2019 1137   LEUKOCYTESUR NEGATIVE 05/26/2019 1137   Sepsis Labs: Invalid input(s): PROCALCITONIN, LACTICIDVEN  Recent Results (from the past 240 hour(s))  Novel Coronavirus, NAA (Hosp order, Send-out to  Ref Lab; TAT 18-24 hrs     Status: Abnormal   Collection Time: 05/26/19 11:10 AM   Specimen: Nasopharyngeal Swab; Respiratory  Result Value Ref Range Status   SARS-CoV-2, NAA DETECTED (A) NOT DETECTED Final    Comment: (NOTE)                  Client Requested Flag This nucleic acid amplification test was developed and its performance characteristics determined by [employer]. Nucleic acid amplification tests include PCR and TMA. This test has not been FDA cleared or approved. This test has been authorized by FDA under an Emergency Use Authorization (EUA). This test is only authorized for the duration of time the declaration that circumstances exist justifying the authorization of the emergency use of in vitro diagnostic tests for detection of SARS-CoV-2 virus and/or diagnosis of COVID-19 infection under section 564(b)(1) of the Act, 21 U.S.C. [id number]) (1), unless the authorization is terminated or revoked sooner. When diagnostic testing is negative, the possibility of a false negative result should be considered in the context of a patient's recent exposures and the presence of clinical signs and symptoms consistent with COVID-19. An individual without symptoms of COVID-  19 and who is not shedding SARS-CoV-2 virus would expect to have a negative (not detected) result in this assay. Performed At: [facility] [address] [place], [place] [id number] Rush Farmer MD [id number]    [place]  Final    Comment: Performed at [facility], [place] [address], [place], [place] [address]  Blood culture (routine x 2)     Status: None (Preliminary result)   Collection Time: 06/02/19 10:07 PM   Specimen: BLOOD  Result Value Ref Range Status   Specimen Description BLOOD LEFT ANTECUBITAL  Final   Special Requests   Final    BOTTLES DRAWN AEROBIC AND ANAEROBIC Blood Culture adequate volume   Culture   Final    NO GROWTH 3 DAYS Performed  at [facility], [place] [address] [place], [place] [address]    Report Status PENDING  Incomplete  Blood culture (routine x 2)     Status: None (Preliminary result)   Collection Time: 06/03/19  7:30 AM   Specimen: BLOOD  Result Value Ref Range Status   Specimen Description   Final    BLOOD RIGHT ARM Performed at [facility] [address] [place], [place] [address]    Special Requests  Final    BOTTLES DRAWN AEROBIC ONLY Blood Culture adequate volume Performed at [facility] [address] [place], [place] [address]    Culture   Final    NO GROWTH 2 DAYS Performed at [facility] [address] [place], [place] [address]    Report Status PENDING  Incomplete      Radiology Studies: No results found.  Marzetta Board, MD, PhD [facility]  Contact via  [email or web address]  [place] P: [phone] F: [phone]

## 2019-06-05 NOTE — Progress Notes (Addendum)
Inpatient Diabetes Program Recommendations  AACE/ADA: New Consensus Statement on Inpatient Glycemic Control (2015)  Target Ranges:  Prepandial:   less than 140 mg/dL      Peak postprandial:   less than 180 mg/dL (1-2 hours)      Critically ill patients:  140 - 180 mg/dL   Lab Results  Component Value Date   GLUCAP 304 (H) 06/05/2019   HGBA1C 13.2 (H) 06/03/2019    Review of Glycemic Control Results for [name], [name] (MRN [id number]) as of 06/05/2019 11:25  Ref. Range 06/04/2019 07:06 06/04/2019 12:44 06/04/2019 16:25 06/04/2019 21:17 06/05/2019 08:15  Glucose-Capillary Latest Ref Range: 70 - 99 mg/dL 354 (H) 344 (H) 379 (H) 307 (H) 304 (H)   Diabetes history: DM 2 Outpatient Diabetes medications:  Metformin 1000 mg bid, Januvia 100 mg daily Current orders for Inpatient glycemic control:  Novolog sensitive tid with meals and HS Novolog 5 units tid with meals Levemir 14 units bid Decadron 6 mg daily Inpatient Diabetes Program Recommendations:    Note that blood glucoses>goal.  A1C is 13.2% indicating poor control of DM prior to admit as well.  Likely will need insulin at d/c based on A1C results. Unclear however if patient would be agreeable to insulin at d/c?  Attempted to call patient to discuss however no answer.  Will continue to attempt calling patient.   Agree with medication changes for today.    Thanks  Adah Perl, RN, BC-ADM Inpatient Diabetes Coordinator Pager [phone] (8a-5p)  Addendum:  Attempted to call patient x3 this afternoon.  No answer.  Will follow-up on 10/20.

## 2019-06-05 NOTE — Progress Notes (Signed)
Patient remains noncompliant with diet choices and requests food throughout shift. Patient noted to use incentive spirometer throughout shift. Only complaint this shift by patient was that she wants the physician to know that she wants to be discharged today. RN explained Remdesiviir schedule but patient stated she wants to go home.

## 2019-06-06 LAB — COMPREHENSIVE METABOLIC PANEL
ALT: 21 U/L (ref 0–44)
AST: 27 U/L (ref 15–41)
Albumin: 3.1 g/dL — ABNORMAL LOW (ref 3.5–5.0)
Alkaline Phosphatase: 85 U/L (ref 38–126)
Anion gap: 10 (ref 5–15)
BUN: 18 mg/dL (ref 6–20)
CO2: 22 mmol/L (ref 22–32)
Calcium: 9.3 mg/dL (ref 8.9–10.3)
Chloride: 101 mmol/L (ref 98–111)
Creatinine, Ser: 0.66 mg/dL (ref 0.44–1.00)
GFR calc Af Amer: >60 mL/min (ref >60)
GFR calc non Af Amer: >60 mL/min (ref >60)
Glucose, Bld: 328 mg/dL — ABNORMAL HIGH (ref 70–99)
Potassium: 4.1 mmol/L (ref 3.5–5.1)
Sodium: 133 mmol/L — ABNORMAL LOW (ref 135–145)
Total Bilirubin: 0.6 mg/dL (ref 0.3–1.2)
Total Protein: 8.2 g/dL — ABNORMAL HIGH (ref 6.5–8.1)

## 2019-06-06 LAB — GLUCOSE, CAPILLARY
Glucose-Capillary: 338 mg/dL — ABNORMAL HIGH (ref 70–99)
Glucose-Capillary: 380 mg/dL — ABNORMAL HIGH (ref 70–99)

## 2019-06-06 MED ORDER — DEXAMETHASONE 6 MG PO TABS: 6.0000 mg | ORAL_TABLET | Freq: Every day | ORAL | 0 refills | Status: DC

## 2019-06-06 MED ORDER — SODIUM CHLORIDE 0.9 % IV SOLN
100.0000 mg | INTRAVENOUS | Status: AC
  Administered 2019-06-06: 10:00:00 100 mg via INTRAVENOUS
  Filled 2019-06-06: qty 20

## 2019-06-06 NOTE — Discharge Summary (Signed)
Physician Discharge Summary  Beverly Johnson SVX:[id number] DOB: [birth year] DOA: 06/02/2019  PCP: No primary care provider on file.  Admit date: 06/02/2019 Discharge date: 06/06/2019  Admitted From: home Disposition:  home  Recommendations for Outpatient Follow-up:  1. Follow up with PCP in 1-2 weeks 2. Continue decadron for 5 more days   Home Health: none Equipment/Devices: none  Discharge Condition: stable CODE STATUS: Full code Diet recommendation: diabetic  HPI: Per admitting MD, Beverly Johnson is a 43 y.o. female with medical history significant for HIV, type 2 diabetes, and obesity who presents to the ED for evaluation of shortness of breath. Patient states she had new onset of shortness of breath with dry cough beginning 05/26/2019.  She went to urgent care and had a SARS-CoV-2 test performed which resulted positive on 05/29/2019.  She has been having progressive shortness of breath with exertion and at rest, chills, diaphoresis, nausea, vomiting, and diarrhea.  She has had difficulty maintaining adequate oral intake and reports inability to keep anything down other than ice chips.  She has had some lightheadedness and dizziness without syncope.  She has had mild generalized body aches.  Due to her continued symptoms she called EMS.  Per ED documentation she is noted to have O2 saturation of 88% on room air which improved to 97% on 2 L supplemental O2 via [place].  She was brought to the ED for further evaluation. ED Course:  Initial vitals showed BP 135/93, pulse 112, RR 29, temp 100.1 Fahrenheit, SPO2 97% on 2 L supplemental O2 via [place]. Labs notable for WBC 2.9, hemoglobin 12.6, platelets 127,000, sodium 129, potassium 3.9, bicarb 20, BUN 8, creatinine 0.82, serum glucose 338, AST 44, ALT 22, alk phos 86, total bilirubin 0.6, lactic acid 2.5 improved to 1.9 on repeat. Blood cultures obtained and pending.  Portable chest x-ray shows patchy heterogenous lung opacities in the mid and lower  lung zone predominant distribution which is new compared to prior. Patient was given 1 L normal saline and the hospitalist service was consulted to admit for further evaluation and management.  Hospital Course:  Acute Hypoxic Respiratory Failure due to Covid-19 Viral Illness -patient was admitted to the hospital with acute hypoxic respiratory failure due to COVID-19 pneumonia.  She was given remdesivir and completed a 5-day course while hospitalized.  She was also placed on dexamethasone.  Clinically she has improved, she has been weaned to room air and able to ambulate without significant difficulties, she will be discharged home in stable condition and will complete the steroid treatment as an outpatient with 5 doses remaining.  Active Problems HIV -Labs from March 2020 showed normally viral load less than 20 and CD4 of 600, continue home medications Type 2 diabetes mellitus, poorly controlled with hyperglycemia -resume home medications on discharge, strongly advised for tighter CBG control as an outpatient and see PCP as soon as possible.  Advise weight loss Obesity -Would benefit from weight loss   Discharge Diagnoses:  Principal Problem:   Acute respiratory disease due to COVID-19 virus Active Problems:   Human immunodeficiency virus (HIV) disease ([place])   Uncontrolled type 2 diabetes mellitus ([place])  Discharge Instructions  Allergies as of 06/06/2019   No Known Allergies     Medication List    TAKE these medications   benzonatate 200 MG capsule Commonly known as: TESSALON Take 1 capsule (200 mg total) by mouth 3 (three) times daily as needed for cough.   dexamethasone 6 MG tablet Commonly known  as: DECADRON Take 1 tablet (6 mg total) by mouth daily.   Genvoya 150-150-200-10 MG Tabs tablet Generic drug: elvitegravir-cobicistat-emtricitabine-tenofovir TAKE 1 TABLET BY MOUTH DAILY WITH BREAKFAST.   glucose blood test strip Commonly known as: ACCU-CHEK ACTIVE STRIPS Use as  instructed   glucose blood test strip Check your sugar in the morning before you eat breakfast, and one hour after a meal.   glucose monitoring kit monitoring kit 1 each by Does not apply route daily. Check glucose once in the morning before breakfast and 1 hour after a meal   hydrOXYzine 25 MG tablet Commonly known as: ATARAX/VISTARIL Take 1 tablet (25 mg total) by mouth every 4 (four) hours as needed. What changed: reasons to take this   ibuprofen 600 MG tablet Commonly known as: ADVIL Take 1 tablet (600 mg total) by mouth every 6 (six) hours as needed.   lidocaine 2 % solution Commonly known as: XYLOCAINE Use as directed 15 mLs in the mouth or throat as needed for mouth pain.   metFORMIN 500 MG tablet Commonly known as: GLUCOPHAGE Take 2 tablets (1,000 mg total) by mouth 2 (two) times daily with a meal.   sitaGLIPtin 100 MG tablet Commonly known as: JANUVIA Take 1 tablet (100 mg total) by mouth daily.      Follow-up Information    Comer, Okey Regal, MD. Schedule an appointment as soon as possible for a visit in 2 week(s).   Specialty: Infectious Diseases Contact information: [address] [address] [phone]          Consultations:  None   Procedures/Studies:  Dg Chest Portable 1 View  Result Date: 06/02/2019 CLINICAL DATA:  Cough and shortness of breath. Hypoxia. COVID-19 positive. EXAM: PORTABLE CHEST 1 VIEW COMPARISON:  05/27/2019 FINDINGS: Low lung volumes. Development of vague patchy heterogeneous lung opacities in the mid and lower lung zone predominant distribution. Heart size normal for technique. No evidence of pulmonary edema, large pleural effusion or pneumothorax. IMPRESSION: Development of vague patchy heterogeneous lung opacities in the mid and lower lung zone predominant distribution, consistent with multifocal pneumonia, particularly COVID-19 . Electronically Signed   By: Beverly Johnson M.D.   On: 06/02/2019 21:29    Dg Chest Port 1 View  Result Date: 05/27/2019 CLINICAL DATA:  Fever and central chest pain.  Hx DM. EXAM: PORTABLE CHEST 1 VIEW COMPARISON:  Chest radiograph 05/26/2019 FINDINGS: Stable cardiomediastinal contours. The lungs are clear. No pneumothorax or large pleural effusion. No acute finding in the visualized skeleton. IMPRESSION: No evidence of active disease in the chest. Electronically Signed   By: Audie Pinto M.D.   On: 05/27/2019 14:11   Dg Chest Portable 1 View  Result Date: 05/26/2019 CLINICAL DATA:  Pt reports L sided chest pain that radiates to back with SOB, nausea, chills, fever, and non-productive cough x a few days. EXAM: PORTABLE CHEST 1 VIEW COMPARISON:  Chest radiograph 03/03/2019 FINDINGS: Stable cardiomediastinal contours within normal limits for AP technique. Low volumes but the lungs are clear. No pneumothorax or large pleural effusion. No acute finding in the visualized skeleton. IMPRESSION: No evidence of active disease. Electronically Signed   By: Audie Pinto M.D.   On: 05/26/2019 19:41     Subjective: - no chest pain, shortness of breath, no abdominal pain, nausea or vomiting.   Discharge Exam: BP 101/74 (BP Location: Left Arm)    Pulse 77    Temp 97.9 F (36.6 C) (Oral)    Resp 20  Ht _0  (1.702 m)    Wt 113 kg    LMP 05/12/2019    SpO2 97%    BMI 39.02 kg/m   General: Pt is alert, awake, not in acute distress Cardiovascular: RRR, S1/S2 +, no rubs, no gallops Respiratory: CTA bilaterally, no wheezing, no rhonchi Abdominal: Soft, NT, ND, bowel sounds + Extremities: no edema, no cyanosis    The results of significant diagnostics from this hospitalization (including imaging, microbiology, ancillary and laboratory) are listed below for reference.     Microbiology: Recent Results (from the past 240 hour(s))  Blood culture (routine x 2)     Status: None (Preliminary result)   Collection Time: 06/02/19 10:07 PM   Specimen: BLOOD  Result Value  Ref Range Status   Specimen Description BLOOD LEFT ANTECUBITAL  Final   Special Requests   Final    BOTTLES DRAWN AEROBIC AND ANAEROBIC Blood Culture adequate volume   Culture   Final    NO GROWTH 4 DAYS Performed at [facility], [address], [place], [place] [address]    Report Status PENDING  Incomplete  Blood culture (routine x 2)     Status: None (Preliminary result)   Collection Time: 06/03/19  7:30 AM   Specimen: BLOOD  Result Value Ref Range Status   Specimen Description   Final    BLOOD RIGHT ARM Performed at [facility] [address], [place], [place] [address]    Special Requests   Final    BOTTLES DRAWN AEROBIC ONLY Blood Culture adequate volume Performed at [facility] [address] [place], [place] [address]    Culture   Final    NO GROWTH 3 DAYS Performed at [facility], [place] [address] [place], [place] [address]    Report Status PENDING  Incomplete     Labs: BNP (last 3 results) Recent Labs    06/03/19 0730  BNP 16.6   Basic Metabolic Panel: Recent Labs  Lab 06/02/19 2053 06/03/19 0730 06/04/19 0446 06/05/19 0430  NA 129* 133* 131* 132*  K 3.9 4.5 4.6 4.4  CL 96* 103 101 101  CO2 20* 17* 19* 19*  GLUCOSE 338* 319* 325* 358*  BUN _1 CREATININE 0.82 0.61 0.61 0.67  CALCIUM 8.4* 8.5* 8.9 9.6  MG  --  1.7  --   --   PHOS  --  3.2  --   --    Liver Function Tests: Recent Labs  Lab 06/02/19 2053 06/03/19 0730 06/04/19 0446 06/05/19 0430  AST 44* 45* 36 29  ALT _2 ALKPHOS 86 78 85 87  BILITOT 0.6 0.5 0.4 0.9  PROT 8.1 7.9 7.6 8.1  ALBUMIN 2.9* 2.9* 2.7* 3.1*   No results for input(s): LIPASE, AMYLASE in the last 168 hours. No results for input(s): AMMONIA in the last 168 hours. CBC: Recent Labs  Lab 06/02/19 2053 06/03/19 0730 06/04/19 0446 06/05/19 0430  WBC 2.9* 2.3* 1.9* 2.4*  NEUTROABS 1.7 1.4*  --   --   HGB 12.6 11.6* 11.1* 11.4*  HCT 40.7 38.9  36.3 36.8  MCV 76.8* 76.4* 75.0* 74.6*  PLT 127* 118* 160 183   Cardiac Enzymes: No results for input(s): CKTOTAL, CKMB, CKMBINDEX, TROPONINI in the last 168 hours. BNP: Invalid input(s): POCBNP CBG: Recent Labs  Lab 06/05/19 0815 06/05/19 1124 06/05/19 1605 06/05/19 2154 06/06/19 0823  GLUCAP 304* 422* 352* 295* 338*  D-Dimer Recent Labs    06/04/19 0446 06/05/19 0430  DDIMER 1.70* 0.65*   Hgb A1c No results for input(s): HGBA1C in the last 72 hours. Lipid Profile No results for input(s): CHOL, HDL, LDLCALC, TRIG, CHOLHDL, LDLDIRECT in the last 72 hours. Thyroid function studies No results for input(s): TSH, T4TOTAL, T3FREE, THYROIDAB in the last 72 hours.  Invalid input(s): FREET3 Anemia work up No results for input(s): VITAMINB12, FOLATE, FERRITIN, TIBC, IRON, RETICCTPCT in the last 72 hours. Urinalysis    Component Value Date/Time   COLORURINE YELLOW 09/08/2018 0009   APPEARANCEUR CLOUDY (A) 09/08/2018 0009   LABSPEC 1.015 05/26/2019 1137   PHURINE 6.0 05/26/2019 1137   GLUCOSEU 500 (A) 05/26/2019 1137   GLUCOSEU NEG mg/dL 09/17/2006 2003   HGBUR NEGATIVE 05/26/2019 1137   BILIRUBINUR NEGATIVE 05/26/2019 1137   KETONESUR NEGATIVE 05/26/2019 1137   PROTEINUR NEGATIVE 05/26/2019 1137   UROBILINOGEN 0.2 05/26/2019 1137   NITRITE NEGATIVE 05/26/2019 1137   LEUKOCYTESUR NEGATIVE 05/26/2019 1137   Sepsis Labs Invalid input(s): PROCALCITONIN,  WBC,  LACTICIDVEN  FURTHER DISCHARGE INSTRUCTIONS:   Get Medicines reviewed and adjusted: Please take all your medications with you for your next visit with your Primary MD   Laboratory/radiological data: Please request your Primary MD to go over all hospital tests and procedure/radiological results at the follow up, please ask your Primary MD to get all Hospital records sent to his/her office.   In some cases, they will be blood work, cultures and biopsy results pending at the time of your discharge. Please request  that your primary care M.D. goes through all the records of your hospital data and follows up on these results.   Also Note the following: If you experience worsening of your admission symptoms, develop shortness of breath, life threatening emergency, suicidal or homicidal thoughts you must seek medical attention immediately by calling 911 or calling your MD immediately  if symptoms less severe.   You must read complete instructions/literature along with all the possible adverse reactions/side effects for all the Medicines you take and that have been prescribed to you. Take any new Medicines after you have completely understood and accpet all the possible adverse reactions/side effects.    Do not drive when taking Pain medications or sleeping medications (Benzodaizepines)   Do not take more than prescribed Pain, Sleep and Anxiety Medications. It is not advisable to combine anxiety,sleep and pain medications without talking with your primary care practitioner   Special Instructions: If you have smoked or chewed Tobacco  in the last 2 yrs please stop smoking, stop any regular Alcohol  and or any Recreational drug use.   Wear Seat belts while driving.   Please note: You were cared for by a hospitalist during your hospital stay. Once you are discharged, your primary care physician will handle any further medical issues. Please note that NO REFILLS for any discharge medications will be authorized once you are discharged, as it is imperative that you return to your primary care physician (or establish a relationship with a primary care physician if you do not have one) for your post hospital discharge needs so that they can reassess your need for medications and monitor your lab values.  Time coordinating discharge: 35 minutes  SIGNED:  Marzetta Board, MD, PhD 06/06/2019, 9:04 AM

## 2019-06-06 NOTE — Discharge Instructions (Addendum)
Follow with No primary care provider on file. in 2-3 weeks  You need to lose weight, you have obesity with a BMI of 39. Your BMI should be < 25, ideally ~ 22  Please get a complete blood count and chemistry panel checked by your Primary MD at your next visit, and again as instructed by your Primary MD. Please get your medications reviewed and adjusted by your Primary MD.  Please request your Primary MD to go over all Hospital Tests and Procedure/Radiological results at the follow up, please get all Hospital records sent to your Prim MD by signing hospital release before you go home.  In some cases, there will be blood work, cultures and biopsy results pending at the time of your discharge. Please request that your primary care M.D. goes through all the records of your hospital data and follows up on these results.  If you had Pneumonia of Lung problems at the Hospital: Please get a 2 view Chest X ray done in 6-8 weeks after hospital discharge or sooner if instructed by your Primary MD.  If you have Congestive Heart Failure: Please call your Cardiologist or Primary MD anytime you have any of the following symptoms:  1) 3 pound weight gain in 24 hours or 5 pounds in 1 week  2) shortness of breath, with or without a dry hacking cough  3) swelling in the hands, feet or stomach  4) if you have to sleep on extra pillows at night in order to breathe  Follow cardiac low salt diet and 1.5 lit/day fluid restriction.  If you have diabetes Accuchecks 4 times/day, Once in AM empty stomach and then before each meal. Log in all results and show them to your primary doctor at your next visit. If any glucose reading is under 80 or above 300 call your primary MD immediately.  If you have Seizure/Convulsions/Epilepsy: Please do not drive, operate heavy machinery, participate in activities at heights or participate in high speed sports until you have seen by Primary MD or a Neurologist and advised to do so  again. Per [facility] statutes, patients with seizures are not allowed to drive until they have been seizure-free for six months.  Use caution when using heavy equipment or power tools. Avoid working on ladders or at heights. Take showers instead of baths. Ensure the water temperature is not too high on the home water heater. Do not go swimming alone. Do not lock yourself in a room alone (i.e. bathroom). When caring for infants or small children, sit down when holding, feeding, or changing them to minimize risk of injury to the child in the event you have a seizure. Maintain good sleep hygiene. Avoid alcohol.   If you had Gastrointestinal Bleeding: Please ask your Primary MD to check a complete blood count within one week of discharge or at your next visit. Your endoscopic/colonoscopic biopsies that are pending at the time of discharge, will also need to followed by your Primary MD.  Get Medicines reviewed and adjusted. Please take all your medications with you for your next visit with your Primary MD  Please request your Primary MD to go over all hospital tests and procedure/radiological results at the follow up, please ask your Primary MD to get all Hospital records sent to his/her office.  If you experience worsening of your admission symptoms, develop shortness of breath, life threatening emergency, suicidal or homicidal thoughts you must seek medical attention immediately by calling 911 or calling your MD  immediately  if symptoms less severe.  You must read complete instructions/literature along with all the possible adverse reactions/side effects for all the Medicines you take and that have been prescribed to you. Take any new Medicines after you have completely understood and accpet all the possible adverse reactions/side effects.   Do not drive or operate heavy machinery when taking Pain medications.   Do not take more than prescribed Pain, Sleep and Anxiety Medications  Special  Instructions: If you have smoked or chewed Tobacco  in the last 2 yrs please stop smoking, stop any regular Alcohol  and or any Recreational drug use.  Wear Seat belts while driving.  Please note You were cared for by a hospitalist during your hospital stay. If you have any questions about your discharge medications or the care you received while you were in the hospital after you are discharged, you can call the unit and asked to speak with the hospitalist on call if the hospitalist that took care of you is not available. Once you are discharged, your primary care physician will handle any further medical issues. Please note that NO REFILLS for any discharge medications will be authorized once you are discharged, as it is imperative that you return to your primary care physician (or establish a relationship with a primary care physician if you do not have one) for your aftercare needs so that they can reassess your need for medications and monitor your lab values.  You can reach the hospitalist office at phone [phone] or fax [phone]   If you do not have a primary care physician, you can call [phone] for a physician referral.  Activity: As tolerated with Full fall precautions use walker/cane & assistance as needed    Diet: diabetic, low calorie  Disposition Home    People who have recovered from the coronavirus, can help a person still fighting the virus and potentially help them recover by giving them convalescent plasma.  What is COVID-19 convalescent plasma? Convalescent plasma (CPP) is plasma collected from people who have recovered from the coronavirus. People who recover from coronavirus infection have developed antibodies to the virus that remain in the plasma portion of their blood. Transfusing the plasma that contains the antibodies into a person still fighting the virus can provide a boost to the patients immune system and potentially help them recover. The experimental  treatment is approved by the FDA to be used on an emergency basis and is called COVID-19 convalescent plasma." Critically ill patients who meet the FDA criteria to receive this therapy can be treated for life-threatening COVID-19.  Can I donate convalescent plasma or blood after being diagnosed with COVID-19? Donors must meet all the required screening criteria for blood donation and the additional FDA criteria, as follows:   Prior diagnosis of COVID-19 documented by an FDA approved laboratory test   A positive diagnostic test at the time of illness OR   A positive serological test for SARS-CoV-2 antibodies after recovery  Complete resolution of symptoms at least 14 days prior to donation and a documented negative COVID-19 FDA approved test OR   Complete resolution of symptoms at least 28 days prior to donation As a part of your pre-donation process you will be required to provide your COVID-19 test result(s).  Where can I donate COVID-19 convalescent plasma? You can complete the pre-donation form at [email or web address] and a donor specialist will be in contact within 24-48 hours.  What is the process for donating COVID-19 Convalescent Plasma?  1. The first step in the donation process is to obtain a copy of your test results or a letter from the testing facility notifying you of your positive result and the date it was taken 2. Complete the pre-donation form at [email or web address] 3. A representative will be in contact within 24-48 hours to provide additional insight into the process 4. Once the process begins, [facility] will ensure you   Meet the required screening criteria for blood donation   Have the required test results   Meet the criteria for resolution of your symptoms   Complete resolution of symptoms at least 14 days prior to donation and a documented negative COVID-19 FDA approved test OR   Complete resolution of symptoms at least 28 days prior to  donation 5. [facility] will then schedule a collection date and time to collect your COVID-19 convalescent plasma  Is the COVID-19 convalescent plasma donation safe? It is safe to donate COVID-19 convalescent plasma and done in the same way that thousands of blood donors donate blood products every day.  My family member or friend is being treated for COVID-19. Can I donate to help them if I am eligible? A directed donation will need to be arranged in conjunction with your family members care provider.  The care provider will determine whether COVID-19 convalescent plasma is the best course of treatment   A directed donation must be arranged through the Clinician Referral page that can be found at [email or web address]   The clinician will need to provide [facility] with information about both you and the patient for which the donation is directed   Please note there are additional qualifications that are required such as your blood type and the patients blood type We encourage you to consider beginning the pre-registration process even if your loved one will not be receiving COVID-19 convalescent plasma as a part of their medical treatment. The need for COVID-19 convalescent plasma increases daily and your donated COVID-19 convalescent plasma could be life-saving for another COVID-19 patient.  Please go to the website [email or web address] if you would like to consider volunteer for plasma donation.      COVID-19 COVID-19 is a respiratory infection that is caused by a virus called severe acute respiratory syndrome coronavirus 2 (SARS-CoV-2). The disease is also known as coronavirus disease or novel coronavirus. In some people, the virus may not cause any symptoms. In others, it may cause a serious infection. The infection can get worse quickly and can lead to complications, such as:  Pneumonia, or infection of the lungs.  Acute respiratory distress syndrome or ARDS. This is fluid  build-up in the lungs.  Acute respiratory failure. This is a condition in which there is not enough oxygen passing from the lungs to the body.  Sepsis or septic shock. This is a serious bodily reaction to an infection.  Blood clotting problems.  Secondary infections due to bacteria or fungus. The virus that causes COVID-19 is contagious. This means that it can spread from person to person through droplets from coughs and sneezes (respiratory secretions). What are the causes? This illness is caused by a virus. You may catch the virus by:  Breathing in droplets from an infected person's cough or sneeze.  Touching something, like a table or a doorknob, that was exposed to the virus (contaminated) and then touching your mouth, nose, or eyes. What increases the risk? Risk for infection You are more likely to be infected with this virus if you:  Live in or travel  to an area with a COVID-19 outbreak.  Come in contact with a sick person who recently traveled to an area with a COVID-19 outbreak.  Provide care for or live with a person who is infected with COVID-19. Risk for serious illness You are more likely to become seriously ill from the virus if you:  Are 83 years of age or older.  Have a long-term disease that lowers your body's ability to fight infection (immunocompromised).  Live in a nursing home or long-term care facility.  Have a long-term (chronic) disease such as: ? Chronic lung disease, including chronic obstructive pulmonary disease or asthma ? Heart disease. ? Diabetes. ? Chronic kidney disease. ? Liver disease.  Are obese. What are the signs or symptoms? Symptoms of this condition can range from mild to severe. Symptoms may appear any time from 2 to 14 days after being exposed to the virus. They include:  A fever.  A cough.  Difficulty breathing.  Chills.  Muscle pains.  A sore throat.  Loss of taste or smell. Some people may also have stomach  problems, such as nausea, vomiting, or diarrhea. Other people may not have any symptoms of COVID-19. How is this diagnosed? This condition may be diagnosed based on:  Your signs and symptoms, especially if: ? You live in an area with a COVID-19 outbreak. ? You recently traveled to or from an area where the virus is common. ? You provide care for or live with a person who was diagnosed with COVID-19.  A physical exam.  Lab tests, which may include: ? A nasal swab to take a sample of fluid from your nose. ? A throat swab to take a sample of fluid from your throat. ? A sample of mucus from your lungs (sputum). ? Blood tests.  Imaging tests, which may include, X-rays, CT scan, or ultrasound. How is this treated? At present, there is no medicine to treat COVID-19. Medicines that treat other diseases are being used on a trial basis to see if they are effective against COVID-19. Your health care provider will talk with you about ways to treat your symptoms. For most people, the infection is mild and can be managed at home with rest, fluids, and over-the-counter medicines. Treatment for a serious infection usually takes places in a hospital intensive care unit (ICU). It may include one or more of the following treatments. These treatments are given until your symptoms improve.  Receiving fluids and medicines through an IV.  Supplemental oxygen. Extra oxygen is given through a tube in the nose, a face mask, or a hood.  Positioning you to lie on your stomach (prone position). This makes it easier for oxygen to get into the lungs.  Continuous positive airway pressure (CPAP) or bi-level positive airway pressure (BPAP) machine. This treatment uses mild air pressure to keep the airways open. A tube that is connected to a motor delivers oxygen to the body.  Ventilator. This treatment moves air into and out of the lungs by using a tube that is placed in your windpipe.  Tracheostomy. This is a  procedure to create a hole in the neck so that a breathing tube can be inserted.  Extracorporeal membrane oxygenation (ECMO). This procedure gives the lungs a chance to recover by taking over the functions of the heart and lungs. It supplies oxygen to the body and removes carbon dioxide. Follow these instructions at home: Lifestyle  If you are sick, stay home except to get medical care. Your  health care provider will tell you how long to stay home. Call your health care provider before you go for medical care.  Rest at home as told by your health care provider.  Do not use any products that contain nicotine or tobacco, such as cigarettes, e-cigarettes, and chewing tobacco. If you need help quitting, ask your health care provider.  Return to your normal activities as told by your health care provider. Ask your health care provider what activities are safe for you. General instructions  Take over-the-counter and prescription medicines only as told by your health care provider.  Drink enough fluid to keep your urine pale yellow.  Keep all follow-up visits as told by your health care provider. This is important. How is this prevented?  There is no vaccine to help prevent COVID-19 infection. However, there are steps you can take to protect yourself and others from this virus. To protect yourself:   Do not travel to areas where COVID-19 is a risk. The areas where COVID-19 is reported change often. To identify high-risk areas and travel restrictions, check the CDC travel website: FatFares.com.br  If you live in, or must travel to, an area where COVID-19 is a risk, take precautions to avoid infection. ? Stay away from people who are sick. ? Wash your hands often with soap and water for 20 seconds. If soap and water are not available, use an alcohol-based hand sanitizer. ? Avoid touching your mouth, face, eyes, or nose. ? Avoid going out in public, follow guidance from your state  and local health authorities. ? If you must go out in public, wear a cloth face covering or face mask. ? Disinfect objects and surfaces that are frequently touched every day. This may include:  Counters and tables.  Doorknobs and light switches.  Sinks and faucets.  Electronics, such as phones, remote controls, keyboards, computers, and tablets. To protect others: If you have symptoms of COVID-19, take steps to prevent the virus from spreading to others.  If you think you have a COVID-19 infection, contact your health care provider right away. Tell your health care team that you think you may have a COVID-19 infection.  Stay home. Leave your house only to seek medical care. Do not use public transport.  Do not travel while you are sick.  Wash your hands often with soap and water for 20 seconds. If soap and water are not available, use alcohol-based hand sanitizer.  Stay away from other members of your household. Let healthy household members care for children and pets, if possible. If you have to care for children or pets, wash your hands often and wear a mask. If possible, stay in your own room, separate from others. Use a different bathroom.  Make sure that all people in your household wash their hands well and often.  Cough or sneeze into a tissue or your sleeve or elbow. Do not cough or sneeze into your hand or into the air.  Wear a cloth face covering or face mask. Where to find more information  Centers for Disease Control and Prevention: PurpleGadgets.be  World Health Organization: https://www.castaneda.info/ Contact a health care provider if:  You live in or have traveled to an area where COVID-19 is a risk and you have symptoms of the infection.  You have had contact with someone who has COVID-19 and you have symptoms of the infection. Get help right away if:  You have trouble breathing.  You have pain or pressure in your  chest.  You have confusion.  You have bluish lips and fingernails.  You have difficulty waking from sleep.  You have symptoms that get worse. These symptoms may represent a serious problem that is an emergency. Do not wait to see if the symptoms will go away. Get medical help right away. Call your local emergency services (911 in the [place] Do not drive yourself to the hospital. Let the emergency medical personnel know if you think you have COVID-19. Summary  COVID-19 is a respiratory infection that is caused by a virus. It is also known as coronavirus disease or novel coronavirus. It can cause serious infections, such as pneumonia, acute respiratory distress syndrome, acute respiratory failure, or sepsis.  The virus that causes COVID-19 is contagious. This means that it can spread from person to person through droplets from coughs and sneezes.  You are more likely to develop a serious illness if you are 91 years of age or older, have a weak immunity, live in a nursing home, or have chronic disease.  There is no medicine to treat COVID-19. Your health care provider will talk with you about ways to treat your symptoms.  Take steps to protect yourself and others from infection. Wash your hands often and disinfect objects and surfaces that are frequently touched every day. Stay away from people who are sick and wear a mask if you are sick. This information is not intended to replace advice given to you by your health care provider. Make sure you discuss any questions you have with your health care provider. Document Released: 09/08/2018 Document Revised: 12/29/2018 Document Reviewed: 09/08/2018 Elsevier Patient Education  2020 [place]: How to Protect Yourself and Others Know how it spreads  There is currently no vaccine to prevent coronavirus disease 2019 (COVID-19).  The best way to prevent illness is to avoid being exposed to this virus.  The virus is thought to spread  mainly from person-to-person. ? Between people who are in close contact with one another (within about 6 feet). ? Through respiratory droplets produced when an infected person coughs, sneezes or talks. ? These droplets can land in the mouths or noses of people who are nearby or possibly be inhaled into the lungs. ? Some recent studies have suggested that COVID-19 may be spread by people who are not showing symptoms. Everyone should Clean your hands often  Wash your hands often with soap and water for at least 20 seconds especially after you have been in a public place, or after blowing your nose, coughing, or sneezing.  If soap and water are not readily available, use a hand sanitizer that contains at least 60% alcohol. Cover all surfaces of your hands and rub them together until they feel dry.  Avoid touching your eyes, nose, and mouth with unwashed hands. Avoid close contact  Stay home if you are sick.  Avoid close contact with people who are sick.  Put distance between yourself and other people. ? Remember that some people without symptoms may be able to spread virus. ? This is especially important for people who are at higher risk of getting very GainPain.com.cy Cover your mouth and nose with a cloth face cover when around others  You could spread COVID-19 to others even if you do not feel sick.  Everyone should wear a cloth face cover when they have to go out in public, for example to the grocery store or to pick up other necessities. ? Cloth face coverings should not  be placed on young children under age 80, anyone who has trouble breathing, or is unconscious, incapacitated or otherwise unable to remove the mask without assistance.  The cloth face cover is meant to protect other people in case you are infected.  Do NOT use a facemask meant for a Dietitian.  Continue to keep about 6 feet between  yourself and others. The cloth face cover is not a substitute for social distancing. Cover coughs and sneezes  If you are in a private setting and do not have on your cloth face covering, remember to always cover your mouth and nose with a tissue when you cough or sneeze or use the inside of your elbow.  Throw used tissues in the trash.  Immediately wash your hands with soap and water for at least 20 seconds. If soap and water are not readily available, clean your hands with a hand sanitizer that contains at least 60% alcohol. Clean and disinfect  Clean AND disinfect frequently touched surfaces daily. This includes tables, doorknobs, light switches, countertops, handles, desks, phones, keyboards, toilets, faucets, and sinks. RackRewards.fr  If surfaces are dirty, clean them: Use detergent or soap and water prior to disinfection.  Then, use a household disinfectant. You can see a list of EPA-registered household disinfectants here. michellinders.com 12/20/2018 This information is not intended to replace advice given to you by your health care provider. Make sure you discuss any questions you have with your health care provider. Document Released: 11/29/2018 Document Revised: 12/28/2018 Document Reviewed: 11/29/2018 Elsevier Patient Education  [place].   Cough, Adult A cough helps to clear your throat and lungs. A cough may be a sign of an illness or another medical condition. An acute cough may only last 2-3 weeks, while a chronic cough may last 8 or more weeks. Many things can cause a cough. They include:  Germs (viruses or bacteria) that attack the airway.  Breathing in things that bother (irritate) your lungs.  Allergies.  Asthma.  Mucus that runs down the back of your throat (postnasal drip).  Smoking.  Acid backing up from the stomach into the tube that moves food from the mouth to the stomach  (gastroesophageal reflux).  Some medicines.  Lung problems.  Other medical conditions, such as heart failure or a blood clot in the lung (pulmonary embolism). Follow these instructions at home: Medicines  Take over-the-counter and prescription medicines only as told by your doctor.  Talk with your doctor before you take medicines that stop a cough (coughsuppressants). Lifestyle   Do not smoke, and try not to be around smoke. Do not use any products that contain nicotine or tobacco, such as cigarettes, e-cigarettes, and chewing tobacco. If you need help quitting, ask your doctor.  Drink enough fluid to keep your pee (urine) pale yellow.  Avoid caffeine.  Do not drink alcohol if your doctor tells you not to drink. General instructions   Watch for any changes in your cough. Tell your doctor about them.  Always cover your mouth when you cough.  Stay away from things that make you cough, such as perfume, candles, campfire smoke, or cleaning products.  If the air is dry, use a cool mist vaporizer or humidifier in your home.  If your cough is worse at night, try using extra pillows to raise your head up higher while you sleep.  Rest as needed.  Keep all follow-up visits as told by your doctor. This is important. Contact a doctor if:  You  have new symptoms.  You cough up pus.  Your cough does not get better after 2-3 weeks, or your cough gets worse.  Cough medicine does not help your cough and you are not sleeping well.  You have pain that gets worse or pain that is not helped with medicine.  You have a fever.  You are losing weight and you do not know why.  You have night sweats. Get help right away if:  You cough up blood.  You have trouble breathing.  Your heartbeat is very fast. These symptoms may be an emergency. Do not wait to see if the symptoms will go away. Get medical help right away. Call your local emergency services (911 in the [place] Do not drive  yourself to the hospital. Summary  A cough helps to clear your throat and lungs. Many things can cause a cough.  Take over-the-counter and prescription medicines only as told by your doctor.  Always cover your mouth when you cough.  Contact a doctor if you have new symptoms or you have a cough that does not get better or gets worse. This information is not intended to replace advice given to you by your health care provider. Make sure you discuss any questions you have with your health care provider. Document Released: 04/16/2011 Document Revised: 08/22/2018 Document Reviewed: 08/22/2018 Elsevier Patient Education  2020 [employer].   COVID-19 Frequently Asked Questions COVID-19 (coronavirus disease) is an infection that is caused by a large family of viruses. Some viruses cause illness in people and others cause illness in animals like camels, cats, and bats. In some cases, the viruses that cause illness in animals can spread to humans. Where did the coronavirus come from? In December 2019, [place] told the [facility]) of several cases of lung disease (human respiratory illness). These cases were linked to an open seafood and livestock market in the city of [place]. The link to the seafood and livestock market suggests that the virus may have spread from animals to humans. However, since that first outbreak in December, the virus has also been shown to spread from person to person. What is the name of the disease and the virus? Disease name Early on, this disease was called novel coronavirus. This is because scientists determined that the disease was caused by a new (novel) respiratory virus. The World Health Organization [facility]) has now named the disease COVID-19, or coronavirus disease. Virus name The virus that causes the disease is called severe acute respiratory syndrome coronavirus 2 (SARS-CoV-2). More information on disease and virus naming World Health Organization  [facility]): www.who.int/emergencies/diseases/novel-coronavirus-2019/technical-guidance/naming-the-coronavirus-disease-(covid-2019)-and-the-virus-that-causes-it Who is at risk for complications from coronavirus disease? Some people may be at higher risk for complications from coronavirus disease. This includes older adults and people who have chronic diseases, such as heart disease, diabetes, and lung disease. If you are at higher risk for complications, take these extra precautions:  Avoid close contact with people who are sick or have a fever or cough. Stay at least 3-6 ft (1-2 m) away from them, if possible.  Wash your hands often with soap and water for at least 20 seconds.  Avoid touching your face, mouth, nose, or eyes.  Keep supplies on hand at home, such as food, medicine, and cleaning supplies.  Stay home as much as possible.  Avoid social gatherings and travel. How does coronavirus disease spread? The virus that causes coronavirus disease spreads easily from person to person (is contagious). There are also cases of  community-spread disease. This means the disease has spread to:  People who have no known contact with other infected people.  People who have not traveled to areas where there are known cases. It appears to spread from one person to another through droplets from coughing or sneezing. Can I get the virus from touching surfaces or objects? There is still a lot that we do not know about the virus that causes coronavirus disease. Scientists are basing a lot of information on what they know about similar viruses, such as:  Viruses cannot generally survive on surfaces for long. They need a human body (host) to survive.  It is more likely that the virus is spread by close contact with people who are sick (direct contact), such as through: ? Shaking hands or hugging. ? Breathing in respiratory droplets that travel through the air. This can happen when an infected person coughs  or sneezes on or near other people.  It is less likely that the virus is spread when a person touches a surface or object that has the virus on it (indirect contact). The virus may be able to enter the body if the person touches a surface or object and then touches his or her face, eyes, nose, or mouth. Can a person spread the virus without having symptoms of the disease? It may be possible for the virus to spread before a person has symptoms of the disease, but this is most likely not the main way the virus is spreading. It is more likely for the virus to spread by being in close contact with people who are sick and breathing in the respiratory droplets of a sick person's cough or sneeze. What are the symptoms of coronavirus disease? Symptoms vary from person to person and can range from mild to severe. Symptoms may include:  Fever.  Cough.  Tiredness, weakness, or fatigue.  Fast breathing or feeling short of breath. These symptoms can appear anywhere from 2 to 14 days after you have been exposed to the virus. If you develop symptoms, call your health care provider. People with severe symptoms may need hospital care. If I am exposed to the virus, how long does it take before symptoms start? Symptoms of coronavirus disease may appear anywhere from 2 to 14 days after a person has been exposed to the virus. If you develop symptoms, call your health care provider. Should I be tested for this virus? Your health care provider will decide whether to test you based on your symptoms, history of exposure, and your risk factors. How does a health care provider test for this virus? Health care providers will collect samples to send for testing. Samples may include:  Taking a swab of fluid from the nose.  Taking fluid from the lungs by having you cough up mucus (sputum) into a sterile cup.  Taking a blood sample.  Taking a stool or urine sample. Is there a treatment or vaccine for this  virus? Currently, there is no vaccine to prevent coronavirus disease. Also, there are no medicines like antibiotics or antivirals to treat the virus. A person who becomes sick is given supportive care, which means rest and fluids. A person may also relieve his or her symptoms by using over-the-counter medicines that treat sneezing, coughing, and runny nose. These are the same medicines that a person takes for the common cold. If you develop symptoms, call your health care provider. People with severe symptoms may need hospital care. What can I do to  protect myself and my family from this virus?     You can protect yourself and your family by taking the same actions that you would take to prevent the spread of other viruses. Take the following actions:  Wash your hands often with soap and water for at least 20 seconds. If soap and water are not available, use alcohol-based hand sanitizer.  Avoid touching your face, mouth, nose, or eyes.  Cough or sneeze into a tissue, sleeve, or elbow. Do not cough or sneeze into your hand or the air. ? If you cough or sneeze into a tissue, throw it away immediately and wash your hands.  Disinfect objects and surfaces that you frequently touch every day.  Avoid close contact with people who are sick or have a fever or cough. Stay at least 3-6 ft (1-2 m) away from them, if possible.  Stay home if you are sick, except to get medical care. Call your health care provider before you get medical care.  Make sure your vaccines are up to date. Ask your health care provider what vaccines you need. What should I do if I need to travel? Follow travel recommendations from your local health authority, the CDC, and WHO. Travel information and advice  Centers for Disease Control and Prevention (CDC): BodyEditor.hu  World Health Organization [facility]): ThirdIncome.ca Know the  risks and take action to protect your health  You are at higher risk of getting coronavirus disease if you are traveling to areas with an outbreak or if you are exposed to travelers from areas with an outbreak.  Wash your hands often and practice good hygiene to lower the risk of catching or spreading the virus. What should I do if I am sick? General instructions to stop the spread of infection  Wash your hands often with soap and water for at least 20 seconds. If soap and water are not available, use alcohol-based hand sanitizer.  Cough or sneeze into a tissue, sleeve, or elbow. Do not cough or sneeze into your hand or the air.  If you cough or sneeze into a tissue, throw it away immediately and wash your hands.  Stay home unless you must get medical care. Call your health care provider or local health authority before you get medical care.  Avoid public areas. Do not take public transportation, if possible.  If you can, wear a mask if you must go out of the house or if you are in close contact with someone who is not sick. Keep your home clean  Disinfect objects and surfaces that are frequently touched every day. This may include: ? Counters and tables. ? Doorknobs and light switches. ? Sinks and faucets. ? Electronics such as phones, remote controls, keyboards, computers, and tablets.  Wash dishes in hot, soapy water or use a dishwasher. Air-dry your dishes.  Wash laundry in hot water. Prevent infecting other household members  Let healthy household members care for children and pets, if possible. If you have to care for children or pets, wash your hands often and wear a mask.  Sleep in a different bedroom or bed, if possible.  Do not share personal items, such as razors, toothbrushes, deodorant, combs, brushes, towels, and washcloths. Where to find more information Centers for Disease Control and Prevention (CDC)  Information and news updates:  https://www.butler-gonzalez.com/ World Health Organization [facility])  Information and news updates: MissExecutive.com.ee  Coronavirus health topic: https://www.castaneda.info/  Questions and answers on COVID-19: OpportunityDebt.at  Global tracker: who.sprinklr.com American Academy  of Pediatrics (AAP)  Information for families: www.healthychildren.org/English/health-issues/conditions/chest-lungs/Pages/2019-Novel-Coronavirus.aspx The coronavirus situation is changing rapidly. Check your local health authority website or the CDC and [facility] websites for updates and news. When should I contact a health care provider?  Contact your health care provider if you have symptoms of an infection, such as fever or cough, and you: ? Have been near anyone who is known to have coronavirus disease. ? Have come into contact with a person who is suspected to have coronavirus disease. ? Have traveled outside of the country. When should I get emergency medical care?  Get help right away by calling your local emergency services (911 in the [place] if you have: ? Trouble breathing. ? Pain or pressure in your chest. ? Confusion. ? Blue-tinged lips and fingernails. ? Difficulty waking from sleep. ? Symptoms that get worse. Let the emergency medical personnel know if you think you have coronavirus disease. Summary  A new respiratory virus is spreading from person to person and causing COVID-19 (coronavirus disease).  The virus that causes COVID-19 appears to spread easily. It spreads from one person to another through droplets from coughing or sneezing.  Older adults and those with chronic diseases are at higher risk of disease. If you are at higher risk for complications, take extra precautions.  There is currently no vaccine to prevent coronavirus disease. There are no medicines, such as antibiotics or antivirals, to treat the  virus.  You can protect yourself and your family by washing your hands often, avoiding touching your face, and covering your coughs and sneezes. This information is not intended to replace advice given to you by your health care provider. Make sure you discuss any questions you have with your health care provider. Document Released: 11/29/2018 Document Revised: 11/29/2018 Document Reviewed: 11/29/2018 Elsevier Patient Education  [place].

## 2019-06-07 LAB — CULTURE, BLOOD (ROUTINE X 2)
Culture: NO GROWTH
Special Requests: ADEQUATE

## 2019-06-08 LAB — CULTURE, BLOOD (ROUTINE X 2)
Culture: NO GROWTH
Special Requests: ADEQUATE

## 2019-06-14 ENCOUNTER — Ambulatory Visit: Payer: Medicaid Other | Admitting: Internal Medicine

## 2019-06-15 ENCOUNTER — Ambulatory Visit: Payer: Self-pay | Admitting: *Deleted

## 2019-06-15 NOTE — Telephone Encounter (Signed)
Pt calling stating that she can't keep anything on her stomach. No liquid or food and everything comes up. Pt states she bumps on her tongue and lips. Taste buds have been different as well. Pt states that this has been going on since she was diagnosed with COVID-19. Pt states that she was at the [place] hospital for 5 days. Patient states that she has been throwing up for about 2 weeks and has not been able to tolerate anything including water. Pt advised to seek treatment in the ED for current symptoms. Pt verbalized understanding.  Pt states her tongue is painful and has blisters. Pt states her tongue  feels like she is eating something spicy when she tries to eat anything. Pt advised to contact PCP for additional recommendations on current symptoms was well. Pt verbalized understanding.

## 2019-06-16 ENCOUNTER — Other Ambulatory Visit: Payer: Self-pay

## 2019-06-16 ENCOUNTER — Encounter (HOSPITAL_COMMUNITY): Payer: Self-pay | Admitting: *Deleted

## 2019-06-16 ENCOUNTER — Inpatient Hospital Stay (HOSPITAL_COMMUNITY)
Admission: EM | Admit: 2019-06-16 | Discharge: 2019-06-17 | DRG: 638 | Discharge status: Against Medical Advice | Payer: Medicaid Other | Attending: Internal Medicine | Admitting: Internal Medicine

## 2019-06-16 ENCOUNTER — Emergency Department (HOSPITAL_COMMUNITY): Payer: Medicaid Other

## 2019-06-16 DIAGNOSIS — B3781 Candidal esophagitis: Secondary | ICD-10-CM | POA: Diagnosis not present

## 2019-06-16 DIAGNOSIS — R112 Nausea with vomiting, unspecified: Secondary | ICD-10-CM | POA: Diagnosis not present

## 2019-06-16 DIAGNOSIS — Z7984 Long term (current) use of oral hypoglycemic drugs: Secondary | ICD-10-CM

## 2019-06-16 DIAGNOSIS — Z8619 Personal history of other infectious and parasitic diseases: Secondary | ICD-10-CM

## 2019-06-16 DIAGNOSIS — E1165 Type 2 diabetes mellitus with hyperglycemia: Principal | ICD-10-CM | POA: Diagnosis present

## 2019-06-16 DIAGNOSIS — K219 Gastro-esophageal reflux disease without esophagitis: Secondary | ICD-10-CM | POA: Diagnosis present

## 2019-06-16 DIAGNOSIS — B20 Human immunodeficiency virus [HIV] disease: Secondary | ICD-10-CM | POA: Diagnosis present

## 2019-06-16 DIAGNOSIS — E860 Dehydration: Secondary | ICD-10-CM | POA: Diagnosis not present

## 2019-06-16 DIAGNOSIS — N179 Acute kidney failure, unspecified: Secondary | ICD-10-CM | POA: Diagnosis present

## 2019-06-16 DIAGNOSIS — R739 Hyperglycemia, unspecified: Secondary | ICD-10-CM

## 2019-06-16 DIAGNOSIS — Z21 Asymptomatic human immunodeficiency virus [HIV] infection status: Secondary | ICD-10-CM | POA: Diagnosis present

## 2019-06-16 DIAGNOSIS — R0602 Shortness of breath: Secondary | ICD-10-CM | POA: Diagnosis not present

## 2019-06-16 DIAGNOSIS — E669 Obesity, unspecified: Secondary | ICD-10-CM

## 2019-06-16 DIAGNOSIS — H538 Other visual disturbances: Secondary | ICD-10-CM | POA: Diagnosis present

## 2019-06-16 DIAGNOSIS — U071 COVID-19: Secondary | ICD-10-CM | POA: Diagnosis not present

## 2019-06-16 DIAGNOSIS — R05 Cough: Secondary | ICD-10-CM | POA: Diagnosis not present

## 2019-06-16 DIAGNOSIS — K1370 Unspecified lesions of oral mucosa: Secondary | ICD-10-CM

## 2019-06-16 DIAGNOSIS — E6609 Other obesity due to excess calories: Secondary | ICD-10-CM

## 2019-06-16 DIAGNOSIS — E119 Type 2 diabetes mellitus without complications: Secondary | ICD-10-CM | POA: Diagnosis present

## 2019-06-16 DIAGNOSIS — H539 Unspecified visual disturbance: Secondary | ICD-10-CM | POA: Diagnosis not present

## 2019-06-16 DIAGNOSIS — Z6841 Body Mass Index (BMI) 40.0 and over, adult: Secondary | ICD-10-CM

## 2019-06-16 DIAGNOSIS — Z8616 Personal history of COVID-19: Secondary | ICD-10-CM | POA: Diagnosis present

## 2019-06-16 DIAGNOSIS — R9431 Abnormal electrocardiogram [ECG] [EKG]: Secondary | ICD-10-CM

## 2019-06-16 LAB — CBC WITH DIFFERENTIAL/PLATELET
Abs Immature Granulocytes: 0.01 10*3/uL (ref 0.00–0.07)
Basophils Absolute: 0 10*3/uL (ref 0.0–0.1)
Basophils Relative: 0 %
Eosinophils Absolute: 0 10*3/uL (ref 0.0–0.5)
Eosinophils Relative: 0 %
HCT: 42.3 % (ref 36.0–46.0)
Hemoglobin: 12.5 g/dL (ref 12.0–15.0)
Immature Granulocytes: 0 %
Lymphocytes Relative: 20 %
Lymphs Abs: 1.5 10*3/uL (ref 0.7–4.0)
MCH: 24 pg — ABNORMAL LOW (ref 26.0–34.0)
MCHC: 29.6 g/dL — ABNORMAL LOW (ref 30.0–36.0)
MCV: 81.3 fL (ref 80.0–100.0)
Monocytes Absolute: 0.4 10*3/uL (ref 0.1–1.0)
Monocytes Relative: 5 %
Neutro Abs: 5.3 10*3/uL (ref 1.7–7.7)
Neutrophils Relative %: 75 %
Platelets: 202 10*3/uL (ref 150–400)
RBC: 5.2 MIL/uL — ABNORMAL HIGH (ref 3.87–5.11)
RDW: 14.8 % (ref 11.5–15.5)
WBC: 7.2 10*3/uL (ref 4.0–10.5)
nRBC: 0 % (ref 0.0–0.2)

## 2019-06-16 LAB — POCT I-STAT EG7
Acid-base deficit: 1 mmol/L (ref 0.0–2.0)
Bicarbonate: 24.6 mmol/L (ref 20.0–28.0)
Calcium, Ion: 1.06 mmol/L — ABNORMAL LOW (ref 1.15–1.40)
HCT: 35 % — ABNORMAL LOW (ref 36.0–46.0)
Hemoglobin: 11.9 g/dL — ABNORMAL LOW (ref 12.0–15.0)
O2 Saturation: 89 %
Potassium: 6.1 mmol/L — ABNORMAL HIGH (ref 3.5–5.1)
Sodium: 130 mmol/L — ABNORMAL LOW (ref 135–145)
TCO2: 26 mmol/L (ref 22–32)
pCO2, Ven: 42.8 mmHg — ABNORMAL LOW (ref 44.0–60.0)
pH, Ven: 7.369 (ref 7.250–7.430)
pO2, Ven: 58 mmHg — ABNORMAL HIGH (ref 32.0–45.0)

## 2019-06-16 LAB — URINALYSIS, ROUTINE W REFLEX MICROSCOPIC
Bacteria, UA: NONE SEEN
Bilirubin Urine: NEGATIVE
Glucose, UA: >=500 mg/dL — AB
Hgb urine dipstick: NEGATIVE
Ketones, ur: NEGATIVE mg/dL
Leukocytes,Ua: NEGATIVE
Nitrite: NEGATIVE
Protein, ur: NEGATIVE mg/dL
Specific Gravity, Urine: 1.029 (ref 1.005–1.030)
pH: 6 (ref 5.0–8.0)

## 2019-06-16 LAB — CBG MONITORING, ED
Glucose-Capillary: 297 mg/dL — ABNORMAL HIGH (ref 70–99)
Glucose-Capillary: 327 mg/dL — ABNORMAL HIGH (ref 70–99)
Glucose-Capillary: 468 mg/dL — ABNORMAL HIGH (ref 70–99)
Glucose-Capillary: >600 mg/dL (ref 70–99)
Glucose-Capillary: >600 mg/dL (ref 70–99)

## 2019-06-16 LAB — COMPREHENSIVE METABOLIC PANEL
ALT: 30 U/L (ref 0–44)
AST: 44 U/L — ABNORMAL HIGH (ref 15–41)
Albumin: 3.2 g/dL — ABNORMAL LOW (ref 3.5–5.0)
Alkaline Phosphatase: 139 U/L — ABNORMAL HIGH (ref 38–126)
Anion gap: 16 — ABNORMAL HIGH (ref 5–15)
BUN: 9 mg/dL (ref 6–20)
CO2: 19 mmol/L — ABNORMAL LOW (ref 22–32)
Calcium: 8.8 mg/dL — ABNORMAL LOW (ref 8.9–10.3)
Chloride: 91 mmol/L — ABNORMAL LOW (ref 98–111)
Creatinine, Ser: 1.06 mg/dL — ABNORMAL HIGH (ref 0.44–1.00)
GFR calc Af Amer: >60 mL/min (ref >60)
GFR calc non Af Amer: >60 mL/min (ref >60)
Glucose, Bld: 888 mg/dL (ref 70–99)
Potassium: 4.4 mmol/L (ref 3.5–5.1)
Sodium: 126 mmol/L — ABNORMAL LOW (ref 135–145)
Total Bilirubin: 0.6 mg/dL (ref 0.3–1.2)
Total Protein: 7.4 g/dL (ref 6.5–8.1)

## 2019-06-16 LAB — LACTIC ACID, PLASMA
Lactic Acid, Venous: 6.3 mmol/L (ref 0.5–1.9)
Lactic Acid, Venous: 4.8 mmol/L (ref 0.5–1.9)

## 2019-06-16 LAB — I-STAT BETA HCG BLOOD, ED (MC, WL, AP ONLY): I-stat hCG, quantitative: <5 m[IU]/mL (ref <5)

## 2019-06-16 MED ORDER — SODIUM CHLORIDE 0.9 % IV BOLUS
1000.0000 mL | Freq: Once | INTRAVENOUS | Status: AC
  Administered 2019-06-16: 1000 mL via INTRAVENOUS

## 2019-06-16 MED ORDER — ONDANSETRON HCL 4 MG/2ML IJ SOLN
4.0000 mg | Freq: Once | INTRAMUSCULAR | Status: AC
  Administered 2019-06-16: 4 mg via INTRAVENOUS
  Filled 2019-06-16: qty 2

## 2019-06-16 MED ORDER — POTASSIUM CHLORIDE 10 MEQ/100ML IV SOLN
10.0000 meq | INTRAVENOUS | Status: AC
  Administered 2019-06-16 (×2): 10 meq via INTRAVENOUS
  Filled 2019-06-16 (×2): qty 100

## 2019-06-16 MED ORDER — FLUCONAZOLE IN SODIUM CHLORIDE 200-0.9 MG/100ML-% IV SOLN
200.0000 mg | Freq: Once | INTRAVENOUS | Status: AC
  Administered 2019-06-16: 200 mg via INTRAVENOUS
  Filled 2019-06-16: qty 100

## 2019-06-16 MED ORDER — DEXTROSE-NACL 5-0.45 % IV SOLN: INTRAVENOUS | Status: DC

## 2019-06-16 MED ORDER — ALUM & MAG HYDROXIDE-SIMETH 200-200-20 MG/5ML PO SUSP
30.0000 mL | Freq: Once | ORAL | Status: AC
  Administered 2019-06-16: 30 mL via ORAL
  Filled 2019-06-16: qty 30

## 2019-06-16 MED ORDER — KETOROLAC TROMETHAMINE 15 MG/ML IJ SOLN
15.0000 mg | Freq: Once | INTRAMUSCULAR | Status: AC
  Administered 2019-06-16: 15 mg via INTRAVENOUS
  Filled 2019-06-16: qty 1

## 2019-06-16 MED ORDER — LIDOCAINE VISCOUS HCL 2 % MT SOLN
15.0000 mL | Freq: Once | OROMUCOSAL | Status: AC
  Administered 2019-06-16: 15 mL via ORAL
  Filled 2019-06-16: qty 15

## 2019-06-16 MED ORDER — INSULIN REGULAR(HUMAN) IN NACL 100-0.9 UT/100ML-% IV SOLN
INTRAVENOUS | Status: DC
  Administered 2019-06-16: 5.4 [IU]/h via INTRAVENOUS
  Filled 2019-06-16 (×2): qty 100

## 2019-06-16 MED ORDER — FLUCONAZOLE 100MG IVPB
100.0000 mg | INTRAVENOUS | Status: DC
  Filled 2019-06-16: qty 50

## 2019-06-16 MED ORDER — INSULIN ASPART 100 UNIT/ML ~~LOC~~ SOLN: 10.0000 [IU] | Freq: Once | SUBCUTANEOUS | Status: DC

## 2019-06-16 MED ORDER — SODIUM CHLORIDE 0.9% FLUSH: 3.0000 mL | Freq: Once | INTRAVENOUS | Status: DC

## 2019-06-16 NOTE — H&P (Signed)
Beverly Johnson QMV:[id number] DOB: Nov 28, 1975 DOA: 06/16/2019   PCP: Patient, No Pcp Per   Outpatient Specialists:   ID Comer  Patient arrived to ER on 06/16/19 at 1547  Patient coming from: home Lives  With family    Chief Complaint:   Chief Complaint  Patient presents with  . Blurred Vision    covid positive on 10/9    HPI: Beverly Johnson is a 43 y.o. female with medical history significant of  HIV, type 2 diabetes, and obesity    Presented with blurred vision, fatigue  ulcers in her mouth ever since her diagnosis of Covid she never completely recovered She continues to have ongoing coughing and shortness of breath she now has burning sensation in her epigastric region which is worse with eating food.  She has been nauseous and often vomiting her food.  Intermittent blurred vision Since fevers or chills she has had some loose bowel movements but no severe diarrhea Reports dry cough, persistent loss of taste and smell.  No longer on Decadron  She chest pain reports with swallowing it comes and goes reports pain chest pain is   Nonpositional, does not get worse with inspiration  Patient diagnosed with Covid on 9 October admitted to [facility] for from 16th to 20 October, hospital stay complicated by mild hypoxia required up to 2 L of oxygen she was started on Decadron and Remdesivir  Infectious risk factors:  Reports  shortness of breath, dry cough,  N/V/Diarrhea  Last positive test was on 9th October in immunocompromised patient with ongoing symptoms In  ER RAPID COVID TEST  in house testing  Pending  Lab Results  Component Value Date   SARSCOV2NAA POSITIVE (A) 06/16/2019   SARSCOV2NAA DETECTED (A) 05/26/2019   SARSCOV2NAA NOT DETECTED 03/03/2019     Regarding pertinent Chronic problems:  HIV last viral count and CD4 count from March 2020 viral load less than 20 and CD4 of 600,      DM 2 -  Lab Results  Component Value Date   HGBA1C 13.2 (H) 06/03/2019    PO  meds only     obesity-   BMI Readings from Last 1 Encounters:  06/16/19 39.02 kg/m    While in ER:  The following Work up has been ordered so far:  Orders Placed This Encounter  Procedures  . SARS CORONAVIRUS 2 (TAT 6-24 HRS) Nasopharyngeal Nasopharyngeal Swab  . DG Chest Portable 1 View  . Lactic acid, plasma  . Comprehensive metabolic panel  . CBC with Differential  . Urinalysis, Routine w reflex microscopic  . Saline Lock IV, Maintain IV access  . Cardiac monitoring  . Initiate Carrier Fluid Protocol  . Consult to hospitalist  ALL PATIENTS BEING ADMITTED/HAVING PROCEDURES NEED COVID-19 SCREENING  . Airborne Isolation  . Pulse oximetry, continuous  . I-Stat beta hCG blood, ED  . CBG monitoring, ED  . I-Stat Venous Blood Gas, ED    Following Medications were ordered in ER: Medications  sodium chloride flush (NS) 0.9 % injection 3 mL (3 mLs Intravenous Not Given 06/16/19 1753)  insulin regular, human (MYXREDLIN) 100 units/ 100 mL infusion (5.4 Units/hr Intravenous New Bag/Given 06/16/19 2007)  dextrose 5 %-0.45 % sodium chloride infusion (has no administration in time range)  potassium chloride 10 mEq in 100 mL IVPB (10 mEq Intravenous New Bag/Given 06/16/19 2013)  alum & mag hydroxide-simeth (MAALOX/MYLANTA) 200-200-20 MG/5ML suspension 30 mL (30 mLs Oral Given 06/16/19 1833)  And  lidocaine (XYLOCAINE) 2 % viscous mouth solution 15 mL (15 mLs Oral Given 06/16/19 1834)  ondansetron (ZOFRAN) injection 4 mg (4 mg Intravenous Given 06/16/19 1830)  sodium chloride 0.9 % bolus 1,000 mL (1,000 mLs Intravenous New Bag/Given 06/16/19 1839)  ketorolac (TORADOL) 15 MG/ML injection 15 mg (15 mg Intravenous Given 06/16/19 1832)  sodium chloride 0.9 % bolus 1,000 mL (1,000 mLs Intravenous New Bag/Given 06/16/19 2001)        Consult Orders  (From admission, onward)         Start     Ordered   06/16/19 1948  Consult to hospitalist  ALL PATIENTS BEING ADMITTED/HAVING PROCEDURES  NEED COVID-19 SCREENING Paged by Lewie Loron  Once    Comments: ALL PATIENTS BEING ADMITTED/HAVING PROCEDURES NEED COVID-19 SCREENING  Provider:  (Not yet assigned)  Question Answer Comment  Place call to: [facility]   Reason for Consult Admit   Diagnosis/Clinical Info for Consult: Admission for diabetic ketosis      06/16/19 1948           Significant initial  Findings: Abnormal Labs Reviewed  LACTIC ACID, PLASMA - Abnormal; Notable for the following components:      Result Value   Lactic Acid, Venous 6.3 (*)    All other components within normal limits  COMPREHENSIVE METABOLIC PANEL - Abnormal; Notable for the following components:   Sodium 126 (*)    Chloride 91 (*)    CO2 19 (*)    Glucose, Bld 888 (*)    Creatinine, Ser 1.06 (*)    Calcium 8.8 (*)    Albumin 3.2 (*)    AST 44 (*)    Alkaline Phosphatase 139 (*)    Anion gap 16 (*)    All other components within normal limits  CBC WITH DIFFERENTIAL/PLATELET - Abnormal; Notable for the following components:   RBC 5.20 (*)    MCH 24.0 (*)    MCHC 29.6 (*)    All other components within normal limits  CBG MONITORING, ED - Abnormal; Notable for the following components:   Glucose-Capillary >600 (*)    All other components within normal limits    Otherwise labs showing:    Recent Labs  Lab 06/16/19 1745  NA 126*  K 4.4  CO2 19*  GLUCOSE 888*  BUN 9  CREATININE 1.06*  CALCIUM 8.8*    Cr   Up from baseline see below Lab Results  Component Value Date   CREATININE 1.06 (H) 06/16/2019   CREATININE 0.66 06/06/2019   CREATININE 0.67 06/05/2019    Recent Labs  Lab 06/16/19 1745  AST 44*  ALT 30  ALKPHOS 139*  BILITOT 0.6  PROT 7.4  ALBUMIN 3.2*   Lab Results  Component Value Date   CALCIUM 8.8 (L) 06/16/2019   PHOS 3.2 06/03/2019    WBC       Component Value Date/Time   WBC 7.2 06/16/2019 1745   ANC    Component Value Date/Time   NEUTROABS 5.3 06/16/2019 1745   ALC No components  found for: LYMPHAB    Plt: Lab Results  Component Value Date   PLT 202 06/16/2019    Lactic Acid, Venous    Component Value Date/Time   LATICACIDVEN 6.3 (HH) 06/16/2019 1800     COVID-19 Labs  No results for input(s): DDIMER, FERRITIN, LDH, CRP in the last 72 hours.  Lab Results  Component Value Date   SARSCOV2NAA DETECTED (A) 05/26/2019   SARSCOV2NAA NOT DETECTED  03/03/2019    HG/HCT   Stable,     Component Value Date/Time   HGB 12.5 06/16/2019 1745   HCT 42.3 06/16/2019 1745    Troponin 4    ECG: Ordered Personally reviewed by me showing: HR : 88 Rhythm:  NSR,    changes with ST segment elevation diffusely  QTC 404   BNP (last 3 results) Recent Labs    06/03/19 0730  BNP 10.2    ProBNP (last 3 results) No results for input(s): PROBNP in the last 8760 hours.  DM  labs:  HbA1C: Recent Labs    06/03/19 0730  HGBA1C 13.2*       CBG (last 3)  Recent Labs    06/16/19 1916  GLUCAP >600*    UA   no evidence of UTI    Urine analysis:    Component Value Date/Time   COLORURINE COLORLESS (A) 06/16/2019 1945   APPEARANCEUR CLEAR 06/16/2019 1945   LABSPEC 1.029 06/16/2019 1945   PHURINE 6.0 06/16/2019 1945   GLUCOSEU >=500 (A) 06/16/2019 1945   GLUCOSEU NEG mg/dL 16/05/9603 5409   HGBUR NEGATIVE 06/16/2019 1945   BILIRUBINUR NEGATIVE 06/16/2019 1945   KETONESUR NEGATIVE 06/16/2019 1945   PROTEINUR NEGATIVE 06/16/2019 1945   UROBILINOGEN 0.2 05/26/2019 1137   NITRITE NEGATIVE 06/16/2019 1945   LEUKOCYTESUR NEGATIVE 06/16/2019 1945       Ordered   CXR - improving opacities   ED Triage Vitals  Enc Vitals Group     BP 06/16/19 1604 129/82     Pulse Rate 06/16/19 1604 (!) 123     Resp 06/16/19 1604 20     Temp 06/16/19 1604 98 F (36.7 C)     Temp Source 06/16/19 1604 Oral     SpO2 06/16/19 1604 99 %     Weight 06/16/19 1605 249 lb 1.9 oz (113 kg)     Height 06/16/19 1605 5\' 7"  (1.702 m)     Head Circumference --      Peak Flow --       Pain Score 06/16/19 1605 3     Pain Loc --      Pain Edu? --      Excl. in GC? --   TMAX(24)@       Latest  Blood pressure 118/67, pulse (!) 103, temperature 98 F (36.7 C), temperature source Oral, resp. rate (!) 28, height 5\' 7"  (1.702 m), weight 113 kg, last menstrual period 06/01/2019, SpO2 100 %.   Hospitalist was called for admission for Hyperglycemic state And possible  COVID related pericarditis Review of Systems:    Pertinent positives include:  fatigue, abdominal pain, nausea, vomiting, diarrhea,   Constitutional:  No weight loss, night sweats, Fevers, chills,weight loss  HEENT:  No headaches, Difficulty swallowing,Tooth/dental problems,Sore throat,  No sneezing, itching, ear ache, nasal congestion, post nasal drip,  Cardio-vascular:  No chest pain, Orthopnea, PND, anasarca, dizziness, palpitations.no Bilateral lower extremity swelling  GI:  No heartburn, indigestion, change in bowel habits, loss of appetite, melena, blood in stool, hematemesis Resp:  no shortness of breath at rest. No dyspnea on exertion, No excess mucus, no productive cough, No non-productive cough, No coughing up of blood.No change in color of mucus.No wheezing. Skin:  no rash or lesions. No jaundice GU:  no dysuria, change in color of urine, no urgency or frequency. No straining to urinate.  No flank pain.  Musculoskeletal:  No joint pain or no joint swelling. No decreased range of motion. No  back pain.  Psych:  No change in mood or affect. No depression or anxiety. No memory loss.  Neuro: no localizing neurological complaints, no tingling, no weakness, no double vision, no gait abnormality, no slurred speech, no confusion  All systems reviewed and apart from HOPI all are negative  Past Medical History:   Past Medical History:  Diagnosis Date  . Diabetes mellitus without complication (HCC)   . Gallstones 03/10/2011  . HIV infection (HCC)   . Immune deficiency disorder [facility])       Past  Surgical History:  Procedure Laterality Date  . CHOLECYSTECTOMY  06/29/2011   Procedure: LAPAROSCOPIC CHOLECYSTECTOMY WITH INTRAOPERATIVE CHOLANGIOGRAM;  Surgeon: Robyne Askew, MD;  Location: [facility] OR;  Service: General;  Laterality: N/A;  . NO PAST SURGERIES      Social History:  Ambulatory   Independently     reports that she has never smoked. She has never used smokeless tobacco. She reports that she does not drink alcohol or use drugs.   Family History:   Family History  Problem Relation Age of Onset  . Healthy Mother   . Healthy Father     Allergies: No Known Allergies   Prior to Admission medications   Medication Sig Start Date End Date Taking? Authorizing Provider  benzonatate (TESSALON) 200 MG capsule Take 1 capsule (200 mg total) by mouth 3 (three) times daily as needed for cough. 05/26/19   Domenick Gong, MD  dexamethasone (DECADRON) 6 MG tablet Take 1 tablet (6 mg total) by mouth daily. 06/06/19   Gherghe, Daylene Katayama, MD  GENVOYA 150-150-200-10 MG TABS tablet TAKE 1 TABLET BY MOUTH DAILY WITH BREAKFAST. Patient taking differently: Take 1 tablet by mouth daily with breakfast.  09/02/18   Comer, Belia Heman, MD  glucose blood (ACCU-CHEK ACTIVE STRIPS) test strip Use as instructed 05/03/18   Marshell Levan, MD  glucose blood test strip Check your sugar in the morning before you eat breakfast, and one hour after a meal. 05/26/19   Domenick Gong, MD  glucose monitoring kit (FREESTYLE) monitoring kit 1 each by Does not apply route daily. Check glucose once in the morning before breakfast and 1 hour after a meal 05/26/19   Domenick Gong, MD  hydrOXYzine (ATARAX/VISTARIL) 25 MG tablet Take 1 tablet (25 mg total) by mouth every 4 (four) hours as needed. Patient taking differently: Take 25 mg by mouth every 4 (four) hours as needed for itching.  02/07/19   Lawyer, Cristal Deer, PA-C  ibuprofen (ADVIL) 600 MG tablet Take 1 tablet (600 mg total) by mouth every 6 (six) hours as  needed. 05/26/19   Domenick Gong, MD  lidocaine (XYLOCAINE) 2 % solution Use as directed 15 mLs in the mouth or throat as needed for mouth pain. Patient not taking: Reported on 06/02/2019 03/03/19   Fayrene Helper, PA-C  metFORMIN (GLUCOPHAGE) 500 MG tablet Take 2 tablets (1,000 mg total) by mouth 2 (two) times daily with a meal. 03/22/17 09/08/26  Eulah Pont, MD  sitaGLIPtin (JANUVIA) 100 MG tablet Take 1 tablet (100 mg total) by mouth daily. 08/05/17   Beola Cord, MD   Physical Exam: Blood pressure 118/67, pulse (!) 103, temperature 98 F (36.7 C), temperature source Oral, resp. rate (!) 28, height 5\' 7"  (1.702 m), weight 113 kg, last menstrual period 06/01/2019, SpO2 100 %. 1. General:  in No Acute distress    Chronically ill  -appearing 2. Psychological: Alert and Oriented 3. Head/ENT:    Dry Mucous Membranes  Head Non traumatic, neck supple                            Poor Dentition 4. SKIN:  decreased Skin turgor,  Skin clean Dry and intact no rash 5. Heart: Regular rate and rhythm no  Murmur, no Rub or gallop 6. Lungs:   no wheezes or crackles   7. Abdomen: Soft,  non-tender, Non distended   obese  bowel sounds present 8. Lower extremities: no clubbing, cyanosis, no  edema 9. Neurologically Grossly intact, moving all 4 extremities equally  10. MSK: Normal range of motion   All other LABS:     Recent Labs  Lab 06/16/19 1745  WBC 7.2  NEUTROABS 5.3  HGB 12.5  HCT 42.3  MCV 81.3  PLT 202     Recent Labs  Lab 06/16/19 1745  NA 126*  K 4.4  CL 91*  CO2 19*  GLUCOSE 888*  BUN 9  CREATININE 1.06*  CALCIUM 8.8*     Recent Labs  Lab 06/16/19 1745  AST 44*  ALT 30  ALKPHOS 139*  BILITOT 0.6  PROT 7.4  ALBUMIN 3.2*     Cultures:    Component Value Date/Time   SDES  06/03/2019 0730    BLOOD RIGHT ARM Performed at [facility], [address], [place], [place] [address]    SPECREQUEST  06/03/2019 0730     BOTTLES DRAWN AEROBIC ONLY Blood Culture adequate volume Performed at [facility], [address] [place], [place] [address]    CULT  06/03/2019 0730    NO GROWTH 5 DAYS Performed at [facility], [address], [place], [place] [address]    REPTSTATUS 06/08/2019 FINAL 06/03/2019 0730     Radiological Exams on Admission: Dg Chest Portable 1 View  Result Date: 06/16/2019 CLINICAL DATA:  Cough EXAM: PORTABLE CHEST 1 VIEW COMPARISON:  06/02/2019 FINDINGS: Clearing of previously noted pulmonary opacities. Normal heart size. No pneumothorax. IMPRESSION: No active disease. Clearing of previously noted heterogeneous pulmonary opacities. Electronically Signed   By: Jasmine Pang M.D.   On: 06/16/2019 18:24    Chart has been reviewed   Assessment/Plan  43 y.o. female with medical history significant of  HIV, type 2 diabetes, and obesity  Admitted for hyperglycemia  Present on Admission: . Hyperglycemia -in the setting of recent steroid use Initiated glucose stabilizer admit to stepdown  . Human immunodeficiency virus (HIV) disease (HCC) -chronic stable continue home medications check viral load and CD4 count  . Uncontrolled type 2 diabetes mellitus (HCC) -  . History of 2019 novel coronavirus disease (COVID-19) -if persistent symptoms such as shortness of breath dry cough lack of taste and smell. Given patient immunocompromised status will continue to keep on airborne precautions Repeat testing is positive for COVID again Will evaluate inflammatory markers to try to assess if ongoing process -Airborn Precautions - Given abnormal ECG and possible cardiac complications will admit to [facility]  . Dehydration gently rehydrating    . AKI (acute kidney injury) (HCC) order urine electrolytes rehydrate gently  . Candidal esophagitis (HCC) -probably benefit from fluconazole IV given decreased ability to tolerate p.o.'s  . Abnormal ECG -will need cardiology evaluation  given recent Covid infection, Bedside echo showed no pericardia effusion  troponin unremarkable. Official  Echogram in AM. ER provider discussed case with cardiology who will see in consult in AM   Other plan as per orders.  DVT prophylaxis:  SCD      Code Status:  FULL CODE  as per patient   I had personally discussed CODE STATUS with patient    Family Communication:   Family not at  Bedside    Disposition Plan:     To home once workup is complete and patient is stable                      Consults called:    Cardiology   Admission status:  ED Disposition    ED Disposition Condition Comment   Admit  Hospital Area: [facility] [[id number]]  Level of Care: Progressive [102]  Admit to Progressive based on following criteria: Other see comments  Comments: hyperglycemia  Covid Evaluation: Confirmed COVID Positive  Diagnosis: Hyperglycemia [161096]  Admitting Physician: Therisa Doyne [[id number]]  Attending Physician: Therisa Doyne [[id number]]  PT Class (Do Not Modify): Observation [104]  PT Acc Code (Do Not Modify): Observation [10022]       Obs   Level of care       SDU tele indefinitely please discontinue once patient no longer qualifies if hemodynamically stable and off glucose stabilizer can transfer to cardiac tele bed   Precautions:   Airborne precautions  PPE: Used by the provider:   P100  eye Goggles,  Gloves   gown   Dorean Hiebert 06/17/2019, 2:58 AM    [facility]     after 2 AM please page floor coverage PA If 7AM-7PM, please contact the day team taking care of the patient using [email or web address]

## 2019-06-16 NOTE — Progress Notes (Signed)
Pharmacy Antibiotic Note  Beverly Johnson is a 43 y.o. female admitted on 06/16/2019 with blurred vision, loss of taste and blisters on her tongue.  Patient was recently hospitalized at [facility] for +covid.  Pharmacy has been consulted for fluconazole dosing for oropharangeal candidiasis.    SCr 1.06, CrCL 79 ml/min, WBC WNL  Plan: Fluconazole 200mg  IV x 1, then 100mg  IV Q24H Pharmacy will sign off.  Thank you for the consult!  Height: 5\' 7"  (170.2 cm) Weight: 249 lb 1.9 oz (113 kg) IBW/kg (Calculated) : 61.6  Temp (24hrs), Avg:98 F (36.7 C), Min:98 F (36.7 C), Max:98 F (36.7 C)  Recent Labs  Lab 06/16/19 1745 06/16/19 1800  WBC 7.2  --   CREATININE 1.06*  --   LATICACIDVEN  --  6.3*    Estimated Creatinine Clearance: 90.6 mL/min (A) (by C-G formula based on SCr of 1.06 mg/dL (H)).    No Known Allergies    Pranshu Lyster D. Mina Marble, PharmD, BCPS, [place] 06/16/2019, 9:44 PM

## 2019-06-16 NOTE — ED Triage Notes (Addendum)
Pt states she tested positive for covid on 10/9.  Pt was admitted to [facility] for 5 days.  Since then she's just "not right".  She has blurred vision x 3 days, loss of taste, blisters on her tongue, etc.  HR 118.  Pt is HIV positive.

## 2019-06-16 NOTE — ED Provider Notes (Addendum)
[facility] EMERGENCY DEPARTMENT Provider Note   CSN: [id number] Arrival date & time: 06/16/19  1547     History   Chief Complaint Chief Complaint  Patient presents with  . Blurred Vision    covid positive on 10/9    HPI Beverly Johnson is a 43 y.o. female with a history of HIV (reportedly well controlled on Genvoya, no missed doses, last CD4 count was over 600 measured in March 2020, with a very low viral load), recent hospitalization at [facility] for Covid (tested positive on 10/9), presenting back to the emergency department stating she does not feel well.  Patient reports she has not felt right since her discharge from [facility] 10 days ago on 10/20.  During her hospital stay she was given remdesivir and dexamethasone for acute hypoxic respiratory failure.  She was discharged home with additional 5-day taper of steroids.  She reports she has continued to have ongoing coughing and shortness of breath since coming home.  She also complains of a burning sensation in her epigastrium and mid esophagus that is worse with eating food.  She states she feels nauseated and vomits whenever stress eat or drink anything.  She also reports some intermittent episodes of blurred vision, she is unsure which eye this is affecting, but denies any pain in her eyes.  She also reports some ulcerations under her tongue and on the roof of her mouth, which she says she has had some times in the past, but not recently.  She denies any fevers or chills. She does report loose bowel movements, had one earlier today.  Reports a surgical history of cholecystectomy.  She states she has been compliant with her HIV medications and has not missed a dose.  She denies any new rashes.     HPI  Past Medical History:  Diagnosis Date  . Diabetes mellitus without complication ([place])   . Gallstones 03/10/2011  . HIV infection ([place])   . Immune deficiency disorder [facility])     Patient Active  Problem List   Diagnosis Date Noted  . Hyperglycemia 06/16/2019  . History of 2019 novel coronavirus disease (COVID-19) 06/16/2019  . Dehydration 06/16/2019  . AKI (acute kidney injury) ([place]) 06/16/2019  . Candidal esophagitis ([place]) 06/16/2019  . Abnormal ECG 06/16/2019  . Acute respiratory disease due to COVID-19 virus 06/02/2019  . Medication monitoring encounter 11/12/2017  . Breast nodule 09/02/2017  . Screening examination for venereal disease 03/30/2017  . Encounter for long-term (current) use of high-risk medication 03/30/2017  . Microalbuminuria due to type 2 diabetes mellitus ([place]) 03/30/2017  . Knee pain, chronic 03/30/2017  . Vitamin D deficiency 03/21/2017  . Bilateral leg cramps 01/22/2017  . Healthcare maintenance 03/02/2014  . Morbid obesity with BMI of 45.0-49.9, adult ([place]) 04/27/2012  . Uncontrolled type 2 diabetes mellitus ([place]) 12/08/2010  . Human immunodeficiency virus (HIV) disease ([place]) 09/29/2006    Past Surgical History:  Procedure Laterality Date  . CHOLECYSTECTOMY  06/29/2011   Procedure: LAPAROSCOPIC CHOLECYSTECTOMY WITH INTRAOPERATIVE CHOLANGIOGRAM;  Surgeon: Merrie Roof, MD;  Location: [facility];  Service: General;  Laterality: N/A;  . NO PAST SURGERIES       OB History   No obstetric history on file.      Home Medications    Prior to Admission medications   Medication Sig Start Date End Date Taking? Authorizing Provider  GENVOYA 150-150-200-10 MG TABS tablet TAKE 1 TABLET BY MOUTH DAILY WITH BREAKFAST. Patient taking differently:  Take 1 tablet by mouth daily with breakfast.  09/02/18  Yes Comer, Okey Regal, MD  glucose blood (ACCU-CHEK ACTIVE STRIPS) test strip Use as instructed 05/03/18  Yes Katy Fitch, MD  glucose blood test strip Check your sugar in the morning before you eat breakfast, and one hour after a meal. 05/26/19  Yes Melynda Ripple, MD  glucose monitoring kit (FREESTYLE) monitoring kit 1 each by Does not apply route daily.  Check glucose once in the morning before breakfast and 1 hour after a meal 05/26/19  Yes Melynda Ripple, MD  metFORMIN (GLUCOPHAGE) 500 MG tablet Take 2 tablets (1,000 mg total) by mouth 2 (two) times daily with a meal. 03/22/17 09/08/26 Yes Ledell Noss, MD  sitaGLIPtin (JANUVIA) 100 MG tablet Take 1 tablet (100 mg total) by mouth daily. 08/05/17  Yes Neva Seat, MD  benzonatate (TESSALON) 200 MG capsule Take 1 capsule (200 mg total) by mouth 3 (three) times daily as needed for cough. Patient not taking: Reported on 06/16/2019 05/26/19   Melynda Ripple, MD    Family History Family History  Problem Relation Age of Onset  . Healthy Mother   . Healthy Father     Social History Social History   Tobacco Use  . Smoking status: Never Smoker  . Smokeless tobacco: Never Used  Substance Use Topics  . Alcohol use: No  . Drug use: No     Allergies   Patient has no known allergies.   Review of Systems Review of Systems  Constitutional: Negative for chills and fever.  HENT: Positive for mouth sores. Negative for dental problem, facial swelling, hearing loss and trouble swallowing.   Eyes: Positive for visual disturbance. Negative for photophobia, pain, discharge, redness and itching.  Respiratory: Positive for cough and shortness of breath.   Cardiovascular: Positive for chest pain. Negative for palpitations.  Gastrointestinal: Positive for diarrhea, nausea and vomiting. Negative for abdominal pain and constipation.  Genitourinary: Negative for difficulty urinating and dysuria.  Musculoskeletal: Positive for arthralgias and myalgias. Negative for back pain.  Skin: Negative for rash and wound.  Neurological: Negative for syncope and headaches.  All other systems reviewed and are negative.    Physical Exam Updated Vital Signs BP (!) 161/102   Pulse (!) 106   Temp 98 F (36.7 C) (Oral)   Resp (!) 33   Ht 5' 7"  (1.702 m)   Wt 113 kg   LMP 06/01/2019   SpO2 100%   BMI  39.02 kg/m   Physical Exam Vitals signs and nursing note reviewed.  Constitutional:      General: She is not in acute distress.    Appearance: She is well-developed.  HENT:     Head: Normocephalic and atraumatic.     Mouth/Throat:     Comments: Lesions in mouth (see photo below) Eyes:     Conjunctiva/sclera: Conjunctivae normal.  Neck:     Musculoskeletal: Neck supple.  Cardiovascular:     Rate and Rhythm: Normal rate and regular rhythm.     Pulses: Normal pulses.  Pulmonary:     Effort: Pulmonary effort is normal. No respiratory distress.     Breath sounds: Normal breath sounds.  Abdominal:     General: There is no distension.     Palpations: Abdomen is soft.     Tenderness: There is no abdominal tenderness.  Skin:    General: Skin is warm and dry.     Findings: No rash.  Neurological:     Mental Status:  She is alert.          ED Treatments / Results  Labs (all labs ordered are listed, but only abnormal results are displayed) Labs Reviewed  LACTIC ACID, PLASMA - Abnormal; Notable for the following components:      Result Value   Lactic Acid, Venous 6.3 (*)    All other components within normal limits  LACTIC ACID, PLASMA - Abnormal; Notable for the following components:   Lactic Acid, Venous 4.8 (*)    All other components within normal limits  COMPREHENSIVE METABOLIC PANEL - Abnormal; Notable for the following components:   Sodium 126 (*)    Chloride 91 (*)    CO2 19 (*)    Glucose, Bld 888 (*)    Creatinine, Ser 1.06 (*)    Calcium 8.8 (*)    Albumin 3.2 (*)    AST 44 (*)    Alkaline Phosphatase 139 (*)    Anion gap 16 (*)    All other components within normal limits  CBC WITH DIFFERENTIAL/PLATELET - Abnormal; Notable for the following components:   RBC 5.20 (*)    MCH 24.0 (*)    MCHC 29.6 (*)    All other components within normal limits  URINALYSIS, ROUTINE W REFLEX MICROSCOPIC - Abnormal; Notable for the following components:   Color, Urine  COLORLESS (*)    Glucose, UA >=500 (*)    All other components within normal limits  CBG MONITORING, ED - Abnormal; Notable for the following components:   Glucose-Capillary >600 (*)    All other components within normal limits  CBG MONITORING, ED - Abnormal; Notable for the following components:   Glucose-Capillary >600 (*)    All other components within normal limits  POCT I-STAT EG7 - Abnormal; Notable for the following components:   pCO2, Ven 42.8 (*)    pO2, Ven 58.0 (*)    Sodium 130 (*)    Potassium 6.1 (*)    Calcium, Ion 1.06 (*)    HCT 35.0 (*)    Hemoglobin 11.9 (*)    All other components within normal limits  CBG MONITORING, ED - Abnormal; Notable for the following components:   Glucose-Capillary 468 (*)    All other components within normal limits  CBG MONITORING, ED - Abnormal; Notable for the following components:   Glucose-Capillary 327 (*)    All other components within normal limits  CBG MONITORING, ED - Abnormal; Notable for the following components:   Glucose-Capillary 297 (*)    All other components within normal limits  SARS CORONAVIRUS 2 (TAT 6-24 HRS)  HIV-1 RNA QUANT-NO REFLEX-BLD  CD4/CD8 (T-HELPER/T-SUPPRESSOR CELL)  CREATININE, URINE, RANDOM  OSMOLALITY, URINE  I-STAT BETA HCG BLOOD, ED (MC, WL, AP ONLY)  I-STAT VENOUS BLOOD GAS, ED  TROPONIN I (HIGH SENSITIVITY)  TROPONIN I (HIGH SENSITIVITY)    EKG EKG Interpretation  Date/Time:  Friday June 16 2019 21:38:29 EDT Ventricular Rate:  88 PR Interval:    QRS Duration: 91 QT Interval:  334 QTC Calculation: 404 R Axis:   37 Text Interpretation: Sinus rhythm Abnormal R-wave progression, early transition Borderline ST elevation, lateral leads Baseline wander in lead(s) V1 V5 When compared with ECG of 06/02/2019, No significant change was found Confirmed by Delora Fuel ([id number]) on 06/17/2019 12:11:08 AM   Radiology Dg Chest Portable 1 View  Result Date: 06/16/2019 CLINICAL DATA:  Cough  EXAM: PORTABLE CHEST 1 VIEW COMPARISON:  06/02/2019 FINDINGS: Clearing of previously noted pulmonary opacities. Normal heart size.  No pneumothorax. IMPRESSION: No active disease. Clearing of previously noted heterogeneous pulmonary opacities. Electronically Signed   By: Donavan Foil M.D.   On: 06/16/2019 18:24    Procedures .Critical Care Performed by: Wyvonnia Dusky, MD Authorized by: Wyvonnia Dusky, MD   Critical care provider statement:    Critical care time (minutes):  30   Critical care was necessary to treat or prevent imminent or life-threatening deterioration of the following conditions:  Endocrine crisis   Critical care was time spent personally by me on the following activities:  Discussions with consultants, evaluation of patient's response to treatment, examination of patient, ordering and performing treatments and interventions, ordering and review of laboratory studies, ordering and review of radiographic studies, pulse oximetry, re-evaluation of patient's condition, obtaining history from patient or surrogate and review of old charts Comments:     Hypergylcemia requiring insulin infusion, potassium repletion   (including critical care time)  Medications Ordered in ED Medications  sodium chloride flush (NS) 0.9 % injection 3 mL (3 mLs Intravenous Not Given 06/16/19 1753)  insulin regular, human (MYXREDLIN) 100 units/ 100 mL infusion ( Intravenous Rate/Dose Verify 06/16/19 2244)  fluconazole (DIFLUCAN) IVPB 100 mg (has no administration in time range)  alum & mag hydroxide-simeth (MAALOX/MYLANTA) 200-200-20 MG/5ML suspension 30 mL (30 mLs Oral Given 06/16/19 1833)    And  lidocaine (XYLOCAINE) 2 % viscous mouth solution 15 mL (15 mLs Oral Given 06/16/19 1834)  ondansetron (ZOFRAN) injection 4 mg (4 mg Intravenous Given 06/16/19 1830)  sodium chloride 0.9 % bolus 1,000 mL (1,000 mLs Intravenous New Bag/Given 06/16/19 1839)  ketorolac (TORADOL) 15 MG/ML injection 15 mg  (15 mg Intravenous Given 06/16/19 1832)  sodium chloride 0.9 % bolus 1,000 mL (1,000 mLs Intravenous New Bag/Given 06/16/19 2001)  potassium chloride 10 mEq in 100 mL IVPB ( Intravenous Stopped 06/16/19 2204)  fluconazole (DIFLUCAN) IVPB 200 mg (200 mg Intravenous New Bag/Given 06/16/19 2240)     Initial Impression / Assessment and Plan / ED Course  I have reviewed the triage vital signs and the nursing notes.  Pertinent labs & imaging results that were available during my care of the patient were reviewed by me and considered in my medical decision making (see chart for details).   43 year old female with history as noted above presented emergency department with complaint of oral lesions, burning sensation epigastrium, nausea vomiting, and continued fatigue, in the recent setting of hospitalization for Covid.  She is a history of HIV and is reportedly compliant with her medications and had a normal CD4 count and undetectable viral load several months ago when checked.  She was recently put on steroids during her hospitalization, a course of 5 days after discharge.  We will check her blood work here.  It is unclear to me whether this short course of steroids would significantly increase her risk of opportunistic infection with her HIV, if she has otherwise been compliant with her medications.  Also check a chest x-ray and give her GI cocktail for possible gastritis.  She has had her gallbladder removed.  She has no other abdominal tenderness. Clinical Course as of Jun 17 31  Fri Jun 16, 2019  1948 The patient reports to me that she is on Metformin at home but not on insulin.  I suspect that her hyperglycemia may be iatrogenic and due to the steroid she receives.  However given her level of lactic acidosis and her nausea vomiting poor p.o. intake, I suspect she will need  admission.   [MT]  2238 After admission, I was contacted by the hospitalist with concerns for possible diffuse ST elevation  seen on EKG.  She does appear to have 1 mm elevations in several leads, with no reciprocal depressions noted.  The patient continues to deny chest pain when I went back into the room.  She has no pleuritic pain. I did perform a rapid bedside echocardiogram to look for pericardial effusion, did not see any pericardial effusion.   [MT]  2239 Troponin level was ordered by the hospitalist.  However at this time I have a lower suspicion for acute pericarditis or acute coronary syndrome   [MT]  Sat Jun 17, 2019  0031 I spoke to the cardiology fellow who agrees ECG may be consistent with pericarditis pattern, recommended trending troponins, repeat ECG, echocardiogram in the morning, and cardiology team will round on patient in the AM.  If no chest pain, pt does not need NSAIDS at this time.   [MT]    Clinical Course User Index [MT] Wyvonnia Dusky, MD    Her mouth lesions are most consistent with aphthous ulcers.  No sign of oral hairy leukoplakia.  Does not appear consistent with Kaposi's sarcoma.  She has no visible lesion around the ears or eyes.  Have a low suspicion for herpes zoster infection.  She has no pain in her eyes.  Her vision is in fact 20/30 in both eyes, with full range of motion, and her visual field is intact.  Is unclear which of her eyes she feels is blurry, or whether she in fact is having blurred vision in the moment.  Beverly Johnson was evaluated in Emergency Department on 06/17/2019 for the symptoms described in the history of present illness. She was evaluated in the context of the global COVID-19 pandemic, which necessitated consideration that the patient might be at risk for infection with the SARS-CoV-2 virus that causes COVID-19. Institutional protocols and algorithms that pertain to the evaluation of patients at risk for COVID-19 are in a state of rapid change based on information released by regulatory bodies including the CDC and federal and state organizations. These  policies and algorithms were followed during the patient's care in the ED.    Final Clinical Impressions(s) / ED Diagnoses   Final diagnoses:  Hyperglycemia  Nausea and vomiting, intractability of vomiting not specified, unspecified vomiting type  Visual disturbance  Oral lesion    ED Discharge Orders    None       Sherah Lund, Carola Rhine, MD 06/17/19 3875    Wyvonnia Dusky, MD 06/17/19 [phone]

## 2019-06-17 DIAGNOSIS — K219 Gastro-esophageal reflux disease without esophagitis: Secondary | ICD-10-CM | POA: Diagnosis present

## 2019-06-17 DIAGNOSIS — Z6841 Body Mass Index (BMI) 40.0 and over, adult: Secondary | ICD-10-CM | POA: Diagnosis not present

## 2019-06-17 DIAGNOSIS — B3781 Candidal esophagitis: Secondary | ICD-10-CM | POA: Diagnosis not present

## 2019-06-17 DIAGNOSIS — E119 Type 2 diabetes mellitus without complications: Secondary | ICD-10-CM | POA: Diagnosis not present

## 2019-06-17 DIAGNOSIS — K1370 Unspecified lesions of oral mucosa: Secondary | ICD-10-CM | POA: Diagnosis not present

## 2019-06-17 DIAGNOSIS — U071 COVID-19: Secondary | ICD-10-CM | POA: Diagnosis not present

## 2019-06-17 DIAGNOSIS — R739 Hyperglycemia, unspecified: Secondary | ICD-10-CM | POA: Diagnosis not present

## 2019-06-17 DIAGNOSIS — R079 Chest pain, unspecified: Secondary | ICD-10-CM

## 2019-06-17 DIAGNOSIS — Z8619 Personal history of other infectious and parasitic diseases: Secondary | ICD-10-CM | POA: Diagnosis not present

## 2019-06-17 DIAGNOSIS — N179 Acute kidney failure, unspecified: Secondary | ICD-10-CM | POA: Diagnosis present

## 2019-06-17 DIAGNOSIS — Z7984 Long term (current) use of oral hypoglycemic drugs: Secondary | ICD-10-CM | POA: Diagnosis not present

## 2019-06-17 DIAGNOSIS — H539 Unspecified visual disturbance: Secondary | ICD-10-CM | POA: Diagnosis not present

## 2019-06-17 DIAGNOSIS — H538 Other visual disturbances: Secondary | ICD-10-CM | POA: Diagnosis present

## 2019-06-17 DIAGNOSIS — R05 Cough: Secondary | ICD-10-CM | POA: Diagnosis not present

## 2019-06-17 DIAGNOSIS — Z21 Asymptomatic human immunodeficiency virus [HIV] infection status: Secondary | ICD-10-CM | POA: Diagnosis present

## 2019-06-17 DIAGNOSIS — E1165 Type 2 diabetes mellitus with hyperglycemia: Secondary | ICD-10-CM | POA: Diagnosis not present

## 2019-06-17 DIAGNOSIS — R0602 Shortness of breath: Secondary | ICD-10-CM | POA: Diagnosis not present

## 2019-06-17 DIAGNOSIS — E860 Dehydration: Secondary | ICD-10-CM | POA: Diagnosis present

## 2019-06-17 DIAGNOSIS — R112 Nausea with vomiting, unspecified: Secondary | ICD-10-CM | POA: Diagnosis present

## 2019-06-17 DIAGNOSIS — B20 Human immunodeficiency virus [HIV] disease: Secondary | ICD-10-CM | POA: Diagnosis not present

## 2019-06-17 LAB — COMPREHENSIVE METABOLIC PANEL
ALT: 34 U/L (ref 0–44)
AST: 55 U/L — ABNORMAL HIGH (ref 15–41)
Albumin: 2.6 g/dL — ABNORMAL LOW (ref 3.5–5.0)
Alkaline Phosphatase: 85 U/L (ref 38–126)
Anion gap: 10 (ref 5–15)
BUN: 7 mg/dL (ref 6–20)
CO2: 22 mmol/L (ref 22–32)
Calcium: 8.1 mg/dL — ABNORMAL LOW (ref 8.9–10.3)
Chloride: 104 mmol/L (ref 98–111)
Creatinine, Ser: 0.58 mg/dL (ref 0.44–1.00)
GFR calc Af Amer: >60 mL/min (ref >60)
GFR calc non Af Amer: >60 mL/min (ref >60)
Glucose, Bld: 136 mg/dL — ABNORMAL HIGH (ref 70–99)
Potassium: 4.5 mmol/L (ref 3.5–5.1)
Sodium: 136 mmol/L (ref 135–145)
Total Bilirubin: 1.2 mg/dL (ref 0.3–1.2)
Total Protein: 6.1 g/dL — ABNORMAL LOW (ref 6.5–8.1)

## 2019-06-17 LAB — CBG MONITORING, ED
Glucose-Capillary: 133 mg/dL — ABNORMAL HIGH (ref 70–99)
Glucose-Capillary: 136 mg/dL — ABNORMAL HIGH (ref 70–99)
Glucose-Capillary: 146 mg/dL — ABNORMAL HIGH (ref 70–99)
Glucose-Capillary: 152 mg/dL — ABNORMAL HIGH (ref 70–99)
Glucose-Capillary: 166 mg/dL — ABNORMAL HIGH (ref 70–99)
Glucose-Capillary: 219 mg/dL — ABNORMAL HIGH (ref 70–99)
Glucose-Capillary: 226 mg/dL — ABNORMAL HIGH (ref 70–99)
Glucose-Capillary: 235 mg/dL — ABNORMAL HIGH (ref 70–99)

## 2019-06-17 LAB — CBC
HCT: 34.2 % — ABNORMAL LOW (ref 36.0–46.0)
Hemoglobin: 10.4 g/dL — ABNORMAL LOW (ref 12.0–15.0)
MCH: 23.9 pg — ABNORMAL LOW (ref 26.0–34.0)
MCHC: 30.4 g/dL (ref 30.0–36.0)
MCV: 78.6 fL — ABNORMAL LOW (ref 80.0–100.0)
Platelets: 133 10*3/uL — ABNORMAL LOW (ref 150–400)
RBC: 4.35 MIL/uL (ref 3.87–5.11)
RDW: 14.8 % (ref 11.5–15.5)
WBC: 4.6 10*3/uL (ref 4.0–10.5)
nRBC: 0 % (ref 0.0–0.2)

## 2019-06-17 LAB — C-REACTIVE PROTEIN: CRP: <0.8 mg/dL (ref <1.0)

## 2019-06-17 LAB — LACTIC ACID, PLASMA: Lactic Acid, Venous: 2.2 mmol/L (ref 0.5–1.9)

## 2019-06-17 LAB — PHOSPHORUS: Phosphorus: 1.6 mg/dL — ABNORMAL LOW (ref 2.5–4.6)

## 2019-06-17 LAB — FERRITIN: Ferritin: 22 ng/mL (ref 11–307)

## 2019-06-17 LAB — LACTATE DEHYDROGENASE: LDH: 457 U/L — ABNORMAL HIGH (ref 98–192)

## 2019-06-17 LAB — SARS CORONAVIRUS 2 (TAT 6-24 HRS): SARS Coronavirus 2: POSITIVE — AB

## 2019-06-17 LAB — TSH: TSH: 1.105 u[IU]/mL (ref 0.350–4.500)

## 2019-06-17 LAB — FIBRINOGEN: Fibrinogen: 422 mg/dL (ref 210–475)

## 2019-06-17 LAB — D-DIMER, QUANTITATIVE: D-Dimer, Quant: 0.27 ug/mL-FEU (ref 0.00–0.50)

## 2019-06-17 LAB — MAGNESIUM: Magnesium: 2.1 mg/dL (ref 1.7–2.4)

## 2019-06-17 LAB — OSMOLALITY, URINE: Osmolality, Ur: 594 mOsm/kg (ref 300–900)

## 2019-06-17 LAB — CREATININE, URINE, RANDOM: Creatinine, Urine: 12.96 mg/dL

## 2019-06-17 LAB — TROPONIN I (HIGH SENSITIVITY): Troponin I (High Sensitivity): 4 ng/L (ref <18)

## 2019-06-17 MED ORDER — INSULIN ASPART 100 UNIT/ML ~~LOC~~ SOLN
0.0000 [IU] | Freq: Three times a day (TID) | SUBCUTANEOUS | Status: DC
  Administered 2019-06-17: 5 [IU] via SUBCUTANEOUS

## 2019-06-17 MED ORDER — ACETAMINOPHEN 650 MG RE SUPP: 650.0000 mg | Freq: Four times a day (QID) | RECTAL | Status: DC | PRN

## 2019-06-17 MED ORDER — ELVITEG-COBIC-EMTRICIT-TENOFAF 150-150-200-10 MG PO TABS
1.0000 | ORAL_TABLET | Freq: Every day | ORAL | Status: DC
  Administered 2019-06-17: 1 via ORAL
  Filled 2019-06-17: qty 1

## 2019-06-17 MED ORDER — DEXTROSE-NACL 5-0.45 % IV SOLN
INTRAVENOUS | Status: DC
  Administered 2019-06-17: 02:00:00 via INTRAVENOUS

## 2019-06-17 MED ORDER — SODIUM CHLORIDE 0.9 % IV SOLN: INTRAVENOUS | Status: AC

## 2019-06-17 MED ORDER — HYDROCODONE-ACETAMINOPHEN 5-325 MG PO TABS
1.0000 | ORAL_TABLET | ORAL | Status: DC | PRN
  Administered 2019-06-17: 15:00:00 2 via ORAL
  Filled 2019-06-17: qty 2

## 2019-06-17 MED ORDER — INSULIN ASPART 100 UNIT/ML ~~LOC~~ SOLN: 0.0000 [IU] | Freq: Every day | SUBCUTANEOUS | Status: DC

## 2019-06-17 MED ORDER — ONDANSETRON HCL 4 MG/2ML IJ SOLN: 4.0000 mg | Freq: Four times a day (QID) | INTRAMUSCULAR | Status: DC | PRN

## 2019-06-17 MED ORDER — MAGNESIUM SULFATE 2 GM/50ML IV SOLN: 2.0000 g | Freq: Once | INTRAVENOUS | Status: DC

## 2019-06-17 MED ORDER — ONDANSETRON HCL 4 MG PO TABS: 4.0000 mg | ORAL_TABLET | Freq: Four times a day (QID) | ORAL | Status: DC | PRN

## 2019-06-17 MED ORDER — INSULIN DETEMIR 100 UNIT/ML ~~LOC~~ SOLN
23.0000 [IU] | Freq: Every day | SUBCUTANEOUS | Status: DC
  Filled 2019-06-17: qty 0.23

## 2019-06-17 MED ORDER — ACETAMINOPHEN 325 MG PO TABS: 650.0000 mg | ORAL_TABLET | Freq: Four times a day (QID) | ORAL | Status: DC | PRN

## 2019-06-17 NOTE — Progress Notes (Signed)
Inpatient Diabetes Program Recommendations  AACE/ADA: New Consensus Statement on Inpatient Glycemic Control  Target Ranges:  Prepandial:   less than 140 mg/dL      Peak postprandial:   less than 180 mg/dL (1-2 hours)      Critically ill patients:  140 - 180 mg/dL   Results for Beverly Johnson, Beverly Johnson (MRN [id number]) as of 06/17/2019 13:33  Ref. Range 06/17/2019 00:46 06/17/2019 01:52 06/17/2019 02:50 06/17/2019 04:03 06/17/2019 05:33 06/17/2019 06:33 06/17/2019 08:51 06/17/2019 10:47  Glucose-Capillary Latest Ref Range: 70 - 99 mg/dL 226 (H) 166 (H) 152 (H) 133 (H) 136 (H) 146 (H) 219 (H) 235 (H)  Results for Beverly Johnson, Beverly Johnson (MRN [id number]) as of 06/17/2019 13:33  Ref. Range 06/16/2019 17:45  Glucose Latest Ref Range: 70 - 99 mg/dL [address] [facility])  Results for Beverly Johnson, Beverly Johnson (MRN [id number]) as of 06/17/2019 13:33  Ref. Range 08/05/2017 11:27 06/03/2019 07:30  Hemoglobin A1C Latest Ref Range: 4.8 - 5.6 % 13.6 13.2 (H)   Review of Glycemic Control  Diabetes history: DM2 Outpatient Diabetes medications: Metformin 1000 mg BID, Jardiance 100 mg daily Current orders for Inpatient glycemic control: Novolog 0-9 units TID with meals, Novolog 0-5 units QHS  Inpatient Diabetes Program Recommendations:   Insulin-Basal: Noted patient was on IV insulin and transition to SQ insulin but NOT GIVEN any basal insulin at time of transition. Please consider ordering Levemir 23 units Q24H starting now (based on 113 kg x 0.2 units).  NOTE: Noted consult for Diabetes Coordinator. Diabetes Coordinator is not on campus over the weekend but available by pager from 8am to 5pm for questions or concerns. Chart reviewed. Noted patient was recently inpatient 06/02/19 to 06/06/19 due to respiratory failure due to COVID and was discharged on Decadron 6mg  daily (5 days). Anticipate recent steroids contributing to hyperglycemia. Initial glucose 888 mg/dl and patient was placed on IV insulin drip. Per chart, IV insulin has been  discontinued and SQ insulin ordered. Patient is currently in the Emergency Department. COVID test on 06/16/19 is positive. Recommend ordering basal insulin. Will continue to follow along while inpatient and make further recommendations if needed based on glycemic trends.  Thanks, Barnie Alderman, RN, MSN, CDE Diabetes Coordinator Inpatient Diabetes Program [phone] (Team Pager from 8am to 5pm)

## 2019-06-17 NOTE — ED Notes (Signed)
Dr. Horris Latino paged to [phone] Luellen Pucker, RN paged by Levada Dy

## 2019-06-17 NOTE — ED Notes (Signed)
Spoke with husband regarding visitation--explained that we were not having any visitors with covid.  Scherrie Merritts  [phone] for any updates

## 2019-06-17 NOTE — ED Notes (Signed)
Lunch Tray Ordered @ 1043. 

## 2019-06-17 NOTE — Consult Note (Signed)
         [facility] for Infectious Disease    Date of Admission:  06/16/2019     Ms. Ferrence has a history of obesity, diabetes and well controlled HIV infection.  She is followed by my partner, Dr. Talbot Grumbling.  She developed cough, shortness of breath and myalgias in mid July and was seen in the ED.  Testing for COVID-19 was negative at that time.  She felt a little bit better but then had worsening symptoms leading to retesting on 05/29/2019.  Her SARS-CoV-2 test was positive at that time.  Because of worsening symptoms she was hospitalized from 06/02/2019-06/06/2019.  She was treated with dexamethasone and remdesivir.  She had infiltrates and was mildly hypoxic but improved.  She was readmitted yesterday with chest pain, odynophagia, mouth sores and intermittent blurred vision.  She still has some mild dry cough and shortness of breath but the infiltrates have cleared on her chest x-ray and she is no longer hypoxic.  Pictures taken of the mouth in the ED yesterday showed:    Notes indicate that she is very adherent with her Genvoya.  HIV labs in March showed a CD4 count of 600 and an undetectable viral load less than 20.  Given these very good numbers it is extremely unlikely that she has any HIV related opportunistic infection.  I have ordered repeat CD4 and viral load.  She may have candidal esophagitis related to her diabetes and recent steroid therapy.  I agree with empiric fluconazole.  I would also suggest starting a proton pump inhibitor for possible GERD and reflux esophagitis.  If she is not improving over the next few days I would have a low threshold for adding empiric valacyclovir.  I do not know the cause of her intermittent blurred vision but it is unlikely that she has CMV retinitis given her normal CD4 count several months ago.  I would simply monitor this for now.  Recommend 1. Continue Genvoya 2. Agree with fluconazole 3. Start proton pump inhibitor 4. Check CD4 and HIV  viral load           Michel Bickers, MD [facility] [place] [phone] pager   [phone] cell 06/17/2019, 3:04 PM

## 2019-06-17 NOTE — ED Notes (Signed)
CBG 235 

## 2019-06-17 NOTE — ED Notes (Signed)
Per main lab, result will take 25 - 40.

## 2019-06-17 NOTE — ED Notes (Signed)
Pt leaving AMA. Dr Horris Latino notified. IVs removed. All belongings taken with pt. Pt alert and oriented at baseline.

## 2019-06-17 NOTE — Discharge Summary (Signed)
Discharge Summary  Beverly Johnson [id number] DOB: [birth year]  PCP: Patient, No Pcp Per  Admit date: 06/16/2019 Discharge date: 06/17/2019  Time spent: 40 mins  Recommendations for Outpatient Follow-up:  1. None- Signed AMA  Discharge Diagnoses:  Active Hospital Problems   Diagnosis Date Noted  . Hyperglycemia 06/16/2019  . History of 2019 novel coronavirus disease (COVID-19) 06/16/2019  . Dehydration 06/16/2019  . AKI (acute kidney injury) ([place]) 06/16/2019  . Candidal esophagitis ([place]) 06/16/2019  . Abnormal ECG 06/16/2019  . Morbid obesity with BMI of 45.0-49.9, adult ([place]) 04/27/2012  . Uncontrolled type 2 diabetes mellitus ([place]) 12/08/2010  . Human immunodeficiency virus (HIV) disease ([place]) 09/29/2006    Resolved Hospital Problems  No resolved problems to display.    Discharge Condition:   Diet recommendation:   Vitals:   06/17/19 0639 06/17/19 1100  BP: (!) 164/88 (!) 152/102  Pulse:  95  Resp:  (!) 8  Temp:    SpO2:  100%    History of present illness:  Beverly Johnson is a 43 y.o. female with medical history significant of HIV,type 2 diabetes, and obesity, presents with blurred vision, fatigue, ulcers in her mouth. Pt was diagnosed with Covid on 05/26/2019 admitted to [facility] from 16-28 October and was given Decadron and remdesivir.  Patient was subsequently discharged, but reports has not fully recovered since discharge.  Patient continues to have intermittent shortness of breath, coughing, with burning sensation in epigastric region worse with eating food.  Patient is no longer on Decadron and not requiring oxygen, maintaining sats on room air.   Today, patient reports feeling better, and very eager to be discharged.  Denies any worsening chest/epigastric pain (she stated its from her indigestion), shortness of breath, denies any worsening cough, fever/chills, nausea/vomiting.  After extensive discussion with patient about management, patient later  insisted that she is going to sign AMA.  Risks including death was discussed with the patient if she signed AMA, patient insisted.  Hospital Course:  Active Problems:   Human immunodeficiency virus (HIV) disease ([place])   Uncontrolled type 2 diabetes mellitus ([place])   Morbid obesity with BMI of 45.0-49.9, adult ([place])   Hyperglycemia   History of 2019 novel coronavirus disease (COVID-19)   Dehydration   AKI (acute kidney injury) ([place])   Candidal esophagitis ([facility])   Abnormal ECG  Uncontrolled diabetes mellitus type 2 with hyperglycemia Placed on glucose stabilizer with improvement of CBGs Transition to subcu insulin Last CBG 235, insulin detemir 23 units given Patient signed AMA  COVID-19 virus infection Currently on room air, afebrile Repeat Covid test positive Completed Decadron, remdesivir Continue quarantine  HIV Follow-up with ID as an outpatient ID consulted  Possible candidal esophagitis Started on fluconazole ID consulted  Chest pain/concern for abnorma EKG with diffuse ST segment elevation Likely noncardiac, unlikely pericarditis Recommended echo, signed AMA           Malnutrition Type:      Malnutrition Characteristics:      Nutrition Interventions:      Estimated body mass index is 39.02 kg/m as calculated from the following:   Height as of this encounter: 5\' 7"  (1.702 m).   Weight as of this encounter: 113 kg.    Procedures:  None  Consultations:  ID  Cardiology  Discharge Exam: BP (!) 152/102   Pulse 95   Temp 98 F (36.7 C) (Oral)   Resp (!) 8   Ht 5\' 7"  (1.702 m)   Wt 113 kg  LMP 06/01/2019   SpO2 100%   BMI 39.02 kg/m   General: NAD Cardiovascular: S1, S2 present Respiratory: Diminished breath sounds at the bases bilaterally  Discharge Instructions You were cared for by a hospitalist during your hospital stay. If you have any questions about your discharge medications or the care you received while you were in  the hospital after you are discharged, you can call the unit and asked to speak with the hospitalist on call if the hospitalist that took care of you is not available. Once you are discharged, your primary care physician will handle any further medical issues. Please note that NO REFILLS for any discharge medications will be authorized once you are discharged, as it is imperative that you return to your primary care physician (or establish a relationship with a primary care physician if you do not have one) for your aftercare needs so that they can reassess your need for medications and monitor your lab values.    No Known Allergies    The results of significant diagnostics from this hospitalization (including imaging, microbiology, ancillary and laboratory) are listed below for reference.    Significant Diagnostic Studies: Dg Chest Portable 1 View  Result Date: 06/16/2019 CLINICAL DATA:  Cough EXAM: PORTABLE CHEST 1 VIEW COMPARISON:  06/02/2019 FINDINGS: Clearing of previously noted pulmonary opacities. Normal heart size. No pneumothorax. IMPRESSION: No active disease. Clearing of previously noted heterogeneous pulmonary opacities. Electronically Signed   By: Donavan Foil M.D.   On: 06/16/2019 18:24   Dg Chest Portable 1 View  Result Date: 06/02/2019 CLINICAL DATA:  Cough and shortness of breath. Hypoxia. COVID-19 positive. EXAM: PORTABLE CHEST 1 VIEW COMPARISON:  05/27/2019 FINDINGS: Low lung volumes. Development of vague patchy heterogeneous lung opacities in the mid and lower lung zone predominant distribution. Heart size normal for technique. No evidence of pulmonary edema, large pleural effusion or pneumothorax. IMPRESSION: Development of vague patchy heterogeneous lung opacities in the mid and lower lung zone predominant distribution, consistent with multifocal pneumonia, particularly COVID-19 . Electronically Signed   By: Keith Rake M.D.   On: 06/02/2019 21:29   Dg Chest Port 1  View  Result Date: 05/27/2019 CLINICAL DATA:  Fever and central chest pain.  Hx DM. EXAM: PORTABLE CHEST 1 VIEW COMPARISON:  Chest radiograph 05/26/2019 FINDINGS: Stable cardiomediastinal contours. The lungs are clear. No pneumothorax or large pleural effusion. No acute finding in the visualized skeleton. IMPRESSION: No evidence of active disease in the chest. Electronically Signed   By: Audie Pinto M.D.   On: 05/27/2019 14:11   Dg Chest Portable 1 View  Result Date: 05/26/2019 CLINICAL DATA:  Pt reports L sided chest pain that radiates to back with SOB, nausea, chills, fever, and non-productive cough x a few days. EXAM: PORTABLE CHEST 1 VIEW COMPARISON:  Chest radiograph 03/03/2019 FINDINGS: Stable cardiomediastinal contours within normal limits for AP technique. Low volumes but the lungs are clear. No pneumothorax or large pleural effusion. No acute finding in the visualized skeleton. IMPRESSION: No evidence of active disease. Electronically Signed   By: Audie Pinto M.D.   On: 05/26/2019 19:41    Microbiology: Recent Results (from the past 240 hour(s))  SARS CORONAVIRUS 2 (TAT 6-24 HRS) Nasopharyngeal Nasopharyngeal Swab     Status: Abnormal   Collection Time: 06/16/19  8:58 PM   Specimen: Nasopharyngeal Swab  Result Value Ref Range Status   SARS Coronavirus 2 POSITIVE (A) NEGATIVE Final    Comment: RESULT CALLED TO, READ BACK BY AND  VERIFIED WITH: [facility] RN [id number] 06/17/2019 MCCORMICK K (NOTE) SARS-CoV-2 target nucleic acids are DETECTED. The SARS-CoV-2 RNA is generally detectable in upper and lower respiratory specimens during the acute phase of infection. Positive results are indicative of active infection with SARS-CoV-2. Clinical  correlation with patient history and other diagnostic information is necessary to determine patient infection status. Positive results do  not rule out bacterial infection or co-infection with other viruses. The expected result is Negative.  Fact Sheet for Patients: SugarRoll.be Fact Sheet for Healthcare Providers: https://www.woods-mathews.com/ This test is not yet approved or cleared by the [place] FDA and  has been authorized for detection and/or diagnosis of SARS-CoV-2 by FDA under an Emergency Use Authorization (EUA). This EUA will remain  in effect (meaning this test can be used) fo r the duration of the COVID-19 declaration under Section 564(b)(1) of the Act, 21 U.S.C. section 360bbb-3(b)(1), unless the authorization is terminated or revoked sooner. Performed at [facility], [place] [address], [place], [place] [address]      Labs: Basic Metabolic Panel: Recent Labs  Lab 06/16/19 1745 06/16/19 2111 06/17/19 0431  NA 126* 130* 136  K 4.4 6.1* 4.5  CL 91*  --  104  CO2 19*  --  22  GLUCOSE 888*  --  136*  BUN 9  --  7  CREATININE 1.06*  --  0.58  CALCIUM 8.8*  --  8.1*  MG  --   --  2.1  PHOS  --   --  1.6*   Liver Function Tests: Recent Labs  Lab 06/16/19 1745 06/17/19 0431  AST 44* 55*  ALT 30 34  ALKPHOS 139* 85  BILITOT 0.6 1.2  PROT 7.4 6.1*  ALBUMIN 3.2* 2.6*   No results for input(s): LIPASE, AMYLASE in the last 168 hours. No results for input(s): AMMONIA in the last 168 hours. CBC: Recent Labs  Lab 06/16/19 1745 06/16/19 2111 06/17/19 0431  WBC 7.2  --  4.6  NEUTROABS 5.3  --   --   HGB 12.5 11.9* 10.4*  HCT 42.3 35.0* 34.2*  MCV 81.3  --  78.6*  PLT 202  --  133*   Cardiac Enzymes: No results for input(s): CKTOTAL, CKMB, CKMBINDEX, TROPONINI in the last 168 hours. BNP: BNP (last 3 results) Recent Labs    06/03/19 0730  BNP 10.2    ProBNP (last 3 results) No results for input(s): PROBNP in the last 8760 hours.  CBG: Recent Labs  Lab 06/17/19 0403 06/17/19 0533 06/17/19 0633 06/17/19 0851 06/17/19 1047  GLUCAP 133* 136* 146* 219* 235*       Signed:  Alma Friendly, MD [facility]  06/17/2019, 3:05 PM

## 2019-06-17 NOTE — Consult Note (Signed)
Cardiology Consultation:   Patient ID: Beverly Johnson MRN: [id number]; DOB: 1975/11/30  Admit date: 06/16/2019 Date of Consult: 06/17/2019  Primary Care Provider: Patient, No Pcp Per  Primary Cardiologist: New to [facility] Primary Electrophysiologist:  None    Patient Profile:   Beverly Johnson is a 43 y.o. female with a history of HIV, type 2 diabetes mellitus, and recent hospitalization due to [place] who is being seen today for the evaluation of abnormal EKG at the request of Dr. Roel Cluck.  History of Present Illness:   Beverly Johnson is a 43 year old female with no known cardiac history. She had new onset shortness of breath and dry cough on 05/26/2019 for which she was evaluated for at an Urgent Care and tested positive for COVID-19. She continued to have progressive shortness of breath and was ultimately admitted to [facility] from 06/02/2019 to 06/06/2019. She was treated with 5 day course of Remdesivir and was also placed on Dexamethasone.   Patient returns to the ED yesterday via EMS for general feeling of unwellness. Patient reports continued shortness of breath and non-productive cough since discharge.  She has also had had more nausea, vomiting, poor PO intake over the last couple days.  She states it is hard to swallow and get food down she has some soreness/burning of her tongue.  She also notes some chest discomfort that she describes as indigestion that radiates up her neck and under her breast.  Ranks the pain is a 5-6/10 on the pain scale.  She states this pain is intermittent and states she actually thinks she has noticed it before COVID. She has tried TUMs with no relief.  Not particularly worse after meals and not positional or pleuritic in nature. She denies any diaphoresis or palpitations.  She has noticed some occasional lightheadedness and blurry vision but denies any syncope or stroke symptoms. She reports occasional PND but none recently and denies any other CHF symptoms.  Upon  arrival to the ED, patient mildly tachycardic and hypertensive at times. EKG showed mild upswooping ST segments in multiple leads. High-sensitivity troponin negative. Chest x-ray showed clearing of previously noted heterogeneous pulmonary opacities but no acute findings.  WBC 7.2, Hgb 12.5, Plts 202. Na 126, K 4.4, Glucose 888, BUN 9, Scr 1.06. Albumin 3.2. AST 44, ALT 30, Alk Phos 139. Lactic acid 6.3. COVID-19 positive.   Cardiology consulted for abnormal EKG and reports of chest pain. Patient has no known cardiac history.  She does not see a cardiologist and has never had any type of cardiac work-up including stress test or cardiac catheterization.  She denies any history of tobacco, alcohol, or drug use.  She has no known family history of heart disease.  Heart Pathway Score:     Past Medical History:  Diagnosis Date  . Diabetes mellitus without complication ([place])   . Gallstones 03/10/2011  . HIV infection ([place])   . Immune deficiency disorder [facility])     Past Surgical History:  Procedure Laterality Date  . CHOLECYSTECTOMY  06/29/2011   Procedure: LAPAROSCOPIC CHOLECYSTECTOMY WITH INTRAOPERATIVE CHOLANGIOGRAM;  Surgeon: Merrie Roof, MD;  Location: [facility];  Service: General;  Laterality: N/A;  . NO PAST SURGERIES       Home Medications:  Prior to Admission medications   Medication Sig Start Date End Date Taking? Authorizing Provider  GENVOYA 150-150-200-10 MG TABS tablet TAKE 1 TABLET BY MOUTH DAILY WITH BREAKFAST. Patient taking differently: Take 1 tablet by mouth daily with breakfast.  09/02/18  Yes Comer, Okey Regal, MD  glucose blood (ACCU-CHEK ACTIVE STRIPS) test strip Use as instructed 05/03/18  Yes Katy Fitch, MD  glucose blood test strip Check your sugar in the morning before you eat breakfast, and one hour after a meal. 05/26/19  Yes Melynda Ripple, MD  glucose monitoring kit (FREESTYLE) monitoring kit 1 each by Does not apply route daily. Check glucose once in the morning  before breakfast and 1 hour after a meal 05/26/19  Yes Melynda Ripple, MD  metFORMIN (GLUCOPHAGE) 500 MG tablet Take 2 tablets (1,000 mg total) by mouth 2 (two) times daily with a meal. 03/22/17 09/08/26 Yes Ledell Noss, MD  sitaGLIPtin (JANUVIA) 100 MG tablet Take 1 tablet (100 mg total) by mouth daily. 08/05/17  Yes Neva Seat, MD  benzonatate (TESSALON) 200 MG capsule Take 1 capsule (200 mg total) by mouth 3 (three) times daily as needed for cough. Patient not taking: Reported on 06/16/2019 05/26/19   Melynda Ripple, MD    Inpatient Medications: Scheduled Meds: . elvitegravir-cobicistat-emtricitabine-tenofovir  1 tablet Oral Q breakfast  . sodium chloride flush  3 mL Intravenous Once   Continuous Infusions: . sodium chloride    . dextrose 5 % and 0.45% NaCl 100 mL/hr at 06/17/19 0154  . fluconazole (DIFLUCAN) IV    . insulin 0.9 mL/hr at 06/17/19 8003   PRN Meds: acetaminophen **OR** acetaminophen, HYDROcodone-acetaminophen, ondansetron **OR** ondansetron (ZOFRAN) IV  Allergies:   No Known Allergies  Social History:   Social History   Socioeconomic History  . Marital status: Married    Spouse name: Not on file  . Number of children: Not on file  . Years of education: 41  . Highest education level: Not on file  Occupational History    Employer: UNEMPLOYED  Social Needs  . Financial resource strain: Not on file  . Food insecurity    Worry: Not on file    Inability: Not on file  . Transportation needs    Medical: Not on file    Non-medical: Not on file  Tobacco Use  . Smoking status: Never Smoker  . Smokeless tobacco: Never Used  Substance and Sexual Activity  . Alcohol use: No  . Drug use: No  . Sexual activity: Yes    Partners: Male    Comment: pt. declined condoms  Lifestyle  . Physical activity    Days per week: Not on file    Minutes per session: Not on file  . Stress: Not on file  Relationships  . Social Herbalist on phone: Not on file     Gets together: Not on file    Attends religious service: Not on file    Active member of club or organization: Not on file    Attends meetings of clubs or organizations: Not on file    Relationship status: Not on file  . Intimate partner violence    Fear of current or ex partner: Not on file    Emotionally abused: Not on file    Physically abused: Not on file    Forced sexual activity: Not on file  Other Topics Concern  . Not on file  Social History Narrative  . Not on file    Family History:    Family History  Problem Relation Age of Onset  . Healthy Mother   . Healthy Father      ROS:  Please see the history of present illness.  All other ROS reviewed and  negative.     Physical Exam/Data:   Vitals:   06/17/19 0442 06/17/19 0545 06/17/19 0630 06/17/19 0639  BP: (!) 160/75   (!) 164/88  Pulse:  82 91   Resp:  (!) 0 15   Temp:      TempSrc:      SpO2:  100% 100%   Weight:      Height:        Intake/Output Summary (Last 24 hours) at 06/17/2019 0727 Last data filed at 06/17/2019 4166 Gross per 24 hour  Intake 2337.89 ml  Output 1000 ml  Net 1337.89 ml   Last 3 Weights 06/16/2019 06/03/2019 06/02/2019  Weight (lbs) 249 lb 1.9 oz 249 lb 1.9 oz 250 lb  Weight (kg) 113 kg 113 kg 113.399 kg     Body mass index is 39.02 kg/m.     General: 43 y.o. female resting comfortably in no acute distress. HEENT: Normocephalic and atraumatic. Sclera clear. EOMs intact. Neck: Supple. No carotid bruits. No JVD. Heart: RRR. Distinct S1 and S2. No murmurs, gallops, or rubs. Radial and distal pedal pulses 2+ and equal bilaterally. Lungs: No increased work of breathing. Clear to ausculation bilaterally. No wheezes, rhonchi, or rales.  Abdomen: obeseSoft, non-distended, and non-tender to palpation. Bowel sounds present in all 4 quadrants.  MSK: Normal strength and tone for age. Extremities: No clubbing, cyanosis, or edema.    Skin: Warm and dry. Neuro: Alert and oriented x3.  No focal deficits. Psych: Normal affect. Responds appropriately.  EKG:  The following EKGs were personally reviewed: - EKG from 10/30: Normal sinus rhythm, rate 88 bpm, with mild upswooping ST segment in multiple leads (mainly anterolateral leads) and T wave inversion in lead III.  - EKG from 10/31: Normal sinus rhythm with continue ST/T changes as noted on tracing from 10/30 but slightly less prominent.   Telemetry:  Telemetry was personally reviewed and demonstrates:  nsr  Relevant CV Studies:  Echo pending.  Laboratory Data:  High Sensitivity Troponin:   Recent Labs  Lab 05/26/19 1922 05/27/19 1421 06/16/19 2349  TROPONINIHS _0 Chemistry Recent Labs  Lab 06/16/19 1745 06/16/19 2111 06/17/19 0431  NA 126* 130* 136  K 4.4 6.1* 4.5  CL 91*  --  104  CO2 19*  --  22  GLUCOSE 888*  --  136*  BUN 9  --  7  CREATININE 1.06*  --  0.58  CALCIUM 8.8*  --  8.1*  GFRNONAA >60  --  >60  GFRAA >60  --  >60  ANIONGAP 16*  --  10    Recent Labs  Lab 06/16/19 1745 06/17/19 0431  PROT 7.4 6.1*  ALBUMIN 3.2* 2.6*  AST 44* 55*  ALT 30 34  ALKPHOS 139* 85  BILITOT 0.6 1.2   Hematology Recent Labs  Lab 06/16/19 1745 06/16/19 2111 06/17/19 0431  WBC 7.2  --  4.6  RBC 5.20*  --  4.35  HGB 12.5 11.9* 10.4*  HCT 42.3 35.0* 34.2*  MCV 81.3  --  78.6*  MCH 24.0*  --  23.9*  MCHC 29.6*  --  30.4  RDW 14.8  --  14.8  PLT 202  --  133*   BNPNo results for input(s): BNP, PROBNP in the last 168 hours.  DDimer No results for input(s): DDIMER in the last 168 hours.   Radiology/Studies:  Dg Chest Portable 1 View  Result Date: 06/16/2019 CLINICAL DATA:  Cough EXAM: PORTABLE CHEST  1 VIEW COMPARISON:  06/02/2019 FINDINGS: Clearing of previously noted pulmonary opacities. Normal heart size. No pneumothorax. IMPRESSION: No active disease. Clearing of previously noted heterogeneous pulmonary opacities. Electronically Signed   By: Donavan Foil M.D.   On: 06/16/2019 18:24     Assessment and Plan:   Chest Pain - Patient reports mild intermittent chest discomfort that she describes as indigestion. This is non-cardiac chest pain made worse by eating - EKG shows normal sinus rhythm with mild upsloping ST segments in multiple leads.  - High-sensitivity troponin negative. Repeat pending. - Echo pending.  - Do no suspect ACS. likely GERD. Her ECG does not show pericarditis and we would expect an troponin elevation with pericarditis. In addition, her symptoms are not due to pericarditis. We will review the echo. I do not recommend any additional cardiac workup at this time.   COVID Positive - Admitted to [facility] earlier this month. Treated with Remdesivir and Dexamethasone. Continues to have symptoms since discharge. - COVID-19 testing remains postive. - Lactic acid elevated at 6.3 >> 4.8 >> 2.2. - Management per primary team. She needs supportive care.  Type 2 Diabetes Mellitus  - Glucose >800 on arrival. - Management per primary team. -uncontrolled DM is her biggest problem and should be aggressively treated.  HIV - Management per primary team.    [facility] will sign off.   Medication Recommendations:  none Other recommendations (labs, testing, etc):  Supportive care Follow up as an outpatient:  n/a  For questions or updates, please contact [place] Please consult [email or web address] for contact info under     Signed,  Mikle Bosworth.D.

## 2019-06-18 LAB — HIV-1 RNA QUANT-NO REFLEX-BLD
HIV 1 RNA Quant: <20 copies/mL
LOG10 HIV-1 RNA: UNDETERMINED log10copy/mL

## 2019-06-20 ENCOUNTER — Other Ambulatory Visit: Payer: Self-pay

## 2019-06-20 DIAGNOSIS — Z20828 Contact with and (suspected) exposure to other viral communicable diseases: Secondary | ICD-10-CM | POA: Diagnosis not present

## 2019-06-20 DIAGNOSIS — Z20822 Contact with and (suspected) exposure to covid-19: Secondary | ICD-10-CM

## 2019-06-21 LAB — NOVEL CORONAVIRUS, NAA: SARS-CoV-2, NAA: NOT DETECTED

## 2019-06-26 ENCOUNTER — Other Ambulatory Visit: Payer: Self-pay

## 2019-06-26 ENCOUNTER — Ambulatory Visit (HOSPITAL_COMMUNITY)
Admission: EM | Admit: 2019-06-26 | Discharge: 2019-06-26 | Discharge status: Absent without leave | Payer: Medicaid Other

## 2019-07-18 ENCOUNTER — Ambulatory Visit: Payer: Medicaid Other | Admitting: Internal Medicine

## 2019-08-21 ENCOUNTER — Emergency Department (HOSPITAL_COMMUNITY)
Admission: EM | Admit: 2019-08-21 | Discharge: 2019-08-21 | Discharge status: Against Medical Advice | Payer: Medicaid Other | Attending: Emergency Medicine | Admitting: Emergency Medicine

## 2019-08-21 ENCOUNTER — Ambulatory Visit (HOSPITAL_COMMUNITY)
Admission: EM | Admit: 2019-08-21 | Discharge: 2019-08-21 | Disposition: A | Discharge status: Home / Self Care | Payer: Medicaid Other | Attending: Family Medicine | Admitting: Family Medicine

## 2019-08-21 ENCOUNTER — Encounter (HOSPITAL_COMMUNITY): Payer: Self-pay

## 2019-08-21 ENCOUNTER — Other Ambulatory Visit: Payer: Self-pay

## 2019-08-21 ENCOUNTER — Encounter (HOSPITAL_COMMUNITY): Payer: Self-pay | Admitting: *Deleted

## 2019-08-21 DIAGNOSIS — J069 Acute upper respiratory infection, unspecified: Secondary | ICD-10-CM | POA: Diagnosis not present

## 2019-08-21 DIAGNOSIS — Z5321 Procedure and treatment not carried out due to patient leaving prior to being seen by health care provider: Secondary | ICD-10-CM | POA: Diagnosis not present

## 2019-08-21 NOTE — ED Provider Notes (Signed)
[place]    CSN: [id number] Arrival date & time: 08/21/19  5035      History   Chief Complaint Chief Complaint  Patient presents with  . Nasal Congestion  . Cough  . Sore Throat    HPI Beverly Johnson is a 44 y.o. female.   Beverly Johnson presents with complaints of cough, runny nose. Mild body aches. Occasional diarrhea, dependent on what she eats. Started approximately 4 days ago. No fevers. No chills. No sore throat. No headache. No known ill contacts. No nausea or vomiting. Works at [employer]. Had covid and was hospitalized with this in October of 2020. She does have HIV, per chart review last rna quant was <20, takes her medications regularly . Hasn't taken any medications for symptoms.   ROS per HPI, negative if not otherwise mentioned.      Past Medical History:  Diagnosis Date  . Diabetes mellitus without complication ([place])   . Gallstones 03/10/2011  . HIV infection ([place])   . Immune deficiency disorder [facility])     Patient Active Problem List   Diagnosis Date Noted  . Hyperglycemia 06/16/2019  . History of 2019 novel coronavirus disease (COVID-19) 06/16/2019  . Dehydration 06/16/2019  . AKI (acute kidney injury) ([place]) 06/16/2019  . Candidal esophagitis ([place]) 06/16/2019  . Abnormal ECG 06/16/2019  . Acute respiratory disease due to COVID-19 virus 06/02/2019  . Medication monitoring encounter 11/12/2017  . Breast nodule 09/02/2017  . Screening examination for venereal disease 03/30/2017  . Encounter for long-term (current) use of high-risk medication 03/30/2017  . Microalbuminuria due to type 2 diabetes mellitus ([place]) 03/30/2017  . Knee pain, chronic 03/30/2017  . Vitamin D deficiency 03/21/2017  . Bilateral leg cramps 01/22/2017  . Healthcare maintenance 03/02/2014  . Morbid obesity with BMI of 45.0-49.9, adult ([place]) 04/27/2012  . Uncontrolled type 2 diabetes mellitus ([place]) 12/08/2010  . Human immunodeficiency virus (HIV) disease ([place])  09/29/2006    Past Surgical History:  Procedure Laterality Date  . CHOLECYSTECTOMY  06/29/2011   Procedure: LAPAROSCOPIC CHOLECYSTECTOMY WITH INTRAOPERATIVE CHOLANGIOGRAM;  Surgeon: Beverly Johnson;  Location: [place];  Service: General;  Laterality: N/A;  . NO PAST SURGERIES      OB History   No obstetric history on file.      Home Medications    Prior to Admission medications   Medication Sig Start Date End Date Taking? Authorizing Provider  GENVOYA 150-150-200-10 MG TABS tablet TAKE 1 TABLET BY MOUTH DAILY WITH BREAKFAST. Patient taking differently: Take 1 tablet by mouth daily with breakfast.  09/02/18  Yes Beverly Johnson  metFORMIN (GLUCOPHAGE) 500 MG tablet Take 2 tablets (1,000 mg total) by mouth 2 (two) times daily with a meal. 03/22/17 09/08/26 Yes Beverly Johnson  sitaGLIPtin (JANUVIA) 100 MG tablet Take 1 tablet (100 mg total) by mouth daily. 08/05/17  Yes Beverly Johnson  benzonatate (TESSALON) 200 MG capsule Take 1 capsule (200 mg total) by mouth 3 (three) times daily as needed for cough. Patient not taking: Reported on 06/16/2019 05/26/19   Beverly Johnson  glucose blood [facility] ACTIVE STRIPS) test strip Use as instructed 05/03/18   Beverly Johnson  glucose blood test strip Check your sugar in the morning before you eat breakfast, and one hour after a meal. 05/26/19   Beverly Johnson  glucose monitoring kit (FREESTYLE) monitoring kit 1 each by Does not apply route daily. Check glucose once in the morning  before breakfast and 1 hour after a meal 05/26/19   Beverly Johnson    Family History Family History  Problem Relation Age of Onset  . Healthy Mother   . Healthy Father     Social History Social History   Tobacco Use  . Smoking status: Never Smoker  . Smokeless tobacco: Never Used  Substance Use Topics  . Alcohol use: No  . Drug use: No     Allergies   Patient has no known allergies.   Review of Systems Review of  Systems   Physical Exam Triage Vital Signs ED Triage Vitals  Enc Vitals Group     BP 08/21/19 0846 122/84     Pulse Rate 08/21/19 0844 85     Resp 08/21/19 0844 16     Temp 08/21/19 0844 98.9 F (37.2 C)     Temp Source 08/21/19 0844 Oral     SpO2 08/21/19 0844 100 %     Weight --      Height --      Head Circumference --      Peak Flow --      Pain Score 08/21/19 0842 0     Pain Loc --      Pain Edu? --      Excl. in [place]? --    No data found.  Updated Vital Signs BP 122/84 (BP Location: Left Arm)   Pulse 85   Temp 98.9 F (37.2 C) (Oral)   Resp 16   LMP 08/08/2019   SpO2 100%    Physical Exam Constitutional:      General: She is not in acute distress.    Appearance: She is well-developed.  Cardiovascular:     Rate and Rhythm: Normal rate.  Pulmonary:     Effort: Pulmonary effort is normal.  Skin:    General: Skin is warm and dry.  Neurological:     Mental Status: She is alert and oriented to person, place, and time.      UC Treatments / Results  Labs (all labs ordered are listed, but only abnormal results are displayed) Labs Reviewed - No data to display  EKG   Radiology No results found.  Procedures Procedures (including critical care time)  Medications Ordered in UC Medications - No data to display  Initial Impression / Assessment and Plan / UC Course  I have reviewed the triage vital signs and the nursing notes.  Pertinent labs & imaging results that were available during my care of the patient were reviewed by me and considered in my medical decision making (see chart for details).     Non toxic. Benign physical exam.  Afebrile. No work of breathing. No shortness of breath . No tachycardia. History and physical consistent with viral illness.  Opted to repeat covid testing as has had a negative PCR in November following her hospitalization in October. As HIV positive, although low quants, feel risk of re-infection is higher. Supportive  cares recommended. Return precautions provided. Patient verbalized understanding and agreeable to plan.   Final Clinical Impressions(s) / UC Diagnoses   Final diagnoses:  Upper respiratory tract infection, unspecified type     Discharge Instructions     Self isolate until covid results are back and negative.  Will notify you by phone of any positive findings. Your negative results will be sent through your MyChart.     Push fluids to ensure adequate hydration and keep secretions thin.  Tylenol and/or ibuprofen as needed  for pain or fevers.  Over the counter medications as needed for symptoms.  Please return if nay worsening of symptoms.     ED Prescriptions    None     PDMP not reviewed this encounter.   Zigmund Gottron, NP 08/21/19 1007

## 2019-08-21 NOTE — ED Triage Notes (Signed)
Pt reports multiple complaints such as runny nose and bodyaches for several days. No distress is noted, denies fever.

## 2019-08-21 NOTE — ED Triage Notes (Signed)
Patient presents to Urgent Care with complaints of runny nose, cough, sore throat since 08/18/19. Denies fever or HA, no n/v but diarrhea for x2 days; negative for abd pain or loss of taste/smell. Denies any known exposure.  States positive COVID 06/20/19.

## 2019-08-21 NOTE — Discharge Instructions (Signed)
Self isolate until covid results are back and negative.  Will notify you by phone of any positive findings. Your negative results will be sent through your MyChart.     Push fluids to ensure adequate hydration and keep secretions thin.  Tylenol and/or ibuprofen as needed for pain or fevers.  Over the counter medications as needed for symptoms.  Please return if nay worsening of symptoms.

## 2019-11-01 ENCOUNTER — Other Ambulatory Visit: Payer: Self-pay

## 2019-11-01 ENCOUNTER — Other Ambulatory Visit: Payer: Medicaid Other

## 2019-11-01 DIAGNOSIS — B20 Human immunodeficiency virus [HIV] disease: Secondary | ICD-10-CM | POA: Diagnosis not present

## 2019-11-02 LAB — T-HELPER CELL (CD4) - (RCID CLINIC ONLY)
CD4 % Helper T Cell: 41 % (ref 33–65)
CD4 T Cell Abs: 724 /uL (ref 400–1790)

## 2019-11-03 LAB — HIV-1 RNA QUANT-NO REFLEX-BLD
HIV 1 RNA Quant: <20 copies/mL — AB
HIV-1 RNA Quant, Log: <1.3 Log copies/mL — AB

## 2019-11-04 ENCOUNTER — Other Ambulatory Visit: Payer: Self-pay

## 2019-11-04 ENCOUNTER — Encounter (HOSPITAL_COMMUNITY): Payer: Self-pay | Admitting: Emergency Medicine

## 2019-11-04 ENCOUNTER — Ambulatory Visit (HOSPITAL_COMMUNITY)
Admission: EM | Admit: 2019-11-04 | Discharge: 2019-11-04 | Disposition: A | Discharge status: Home / Self Care | Payer: Medicaid Other | Attending: Family Medicine | Admitting: Family Medicine

## 2019-11-04 DIAGNOSIS — M546 Pain in thoracic spine: Secondary | ICD-10-CM

## 2019-11-04 LAB — POCT URINALYSIS DIP (DEVICE)
Bilirubin Urine: NEGATIVE
Glucose, UA: >=1000 mg/dL — AB
Ketones, ur: NEGATIVE mg/dL
Leukocytes,Ua: NEGATIVE
Nitrite: NEGATIVE
Protein, ur: NEGATIVE mg/dL
Specific Gravity, Urine: <=1.005 (ref 1.005–1.030)
Urobilinogen, UA: 0.2 mg/dL (ref 0.0–1.0)
pH: 5.5 (ref 5.0–8.0)

## 2019-11-04 MED ORDER — IBUPROFEN 600 MG PO TABS: 600.0000 mg | ORAL_TABLET | Freq: Four times a day (QID) | ORAL | 0 refills | Status: DC | PRN

## 2019-11-04 MED ORDER — METHOCARBAMOL 500 MG PO TABS: 500.0000 mg | ORAL_TABLET | Freq: Two times a day (BID) | ORAL | 0 refills | Status: DC

## 2019-11-04 NOTE — ED Triage Notes (Signed)
Pt sts bilateral flank pain into abd area as well as back x 3 days; pt sts she thinks maybe she has UTI; pt sts pain worse with inspiration and movement

## 2019-11-04 NOTE — Discharge Instructions (Signed)
Take the prescribed ibuprofen as needed for your pain.  Take the muscle relaxer Flexeril as needed for muscle spasm; do not drive, operate machinery, or drink alcohol with this medication as it may make you drowsy.    Follow up with an orthopedist if your pain is not improving.  Go to the emergency department if you have worsening pain or develop new symptoms such as difficulty with urination, weakness, numbness, loss of control of your bladder or bowels, fever, chills or other concerns.

## 2019-11-04 NOTE — ED Provider Notes (Signed)
[place]    CSN: [id number] Arrival date & time: 11/04/19  1317      History   Chief Complaint Chief Complaint  Patient presents with  . Flank Pain    HPI Beverly Johnson is a 44 y.o. female.    Patient reports that she has been having back pain in the middle of her for the last 3 days.  Reports that she is not attempted to treat this at home.  States that she thinks she may have a UTI.  States that the pain is worse with deep breaths and lots of movement.  Denies dysuria, frequency, urgency, hematuria.  Denies headache, fever, cough, shortness of breath, sore throat, nausea, vomiting, diarrhea, rash, other symptoms.  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  Patient has significant history for type 2 diabetes, HIV, gallstones in the past.  Reports that she has never had this type of pain before.  The history is provided by the patient.  Flank Pain Pertinent negatives include no chest pain, no abdominal pain and no shortness of breath.    Past Medical History:  Diagnosis Date  . Diabetes mellitus without complication ([place])   . Gallstones 03/10/2011  . HIV infection ([place])   . Immune deficiency disorder [facility])     Patient Active Problem List   Diagnosis Date Noted  . Hyperglycemia 06/16/2019  . History of 2019 novel coronavirus disease (COVID-19) 06/16/2019  . Dehydration 06/16/2019  . AKI (acute kidney injury) ([place]) 06/16/2019  . Candidal esophagitis ([place]) 06/16/2019  . Abnormal ECG 06/16/2019  . Acute respiratory disease due to COVID-19 virus 06/02/2019  . Medication monitoring encounter 11/12/2017  . Breast nodule 09/02/2017  . Screening examination for venereal disease 03/30/2017  . Encounter for long-term (current) use of high-risk medication 03/30/2017  . Microalbuminuria due to type 2 diabetes mellitus ([place]) 03/30/2017  . Knee pain, chronic 03/30/2017    . Vitamin D deficiency 03/21/2017  . Bilateral leg cramps 01/22/2017  . Healthcare maintenance 03/02/2014  . Morbid obesity with BMI of 45.0-49.9, adult ([place]) 04/27/2012  . Uncontrolled type 2 diabetes mellitus ([place]) 12/08/2010  . Human immunodeficiency virus (HIV) disease ([place]) 09/29/2006    Past Surgical History:  Procedure Laterality Date  . CHOLECYSTECTOMY  06/29/2011   Procedure: LAPAROSCOPIC CHOLECYSTECTOMY WITH INTRAOPERATIVE CHOLANGIOGRAM;  Surgeon: Merrie Roof, MD;  Location: [place];  Service: General;  Laterality: N/A;  . NO PAST SURGERIES      OB History   No obstetric history on file.      Home Medications    Prior to Admission medications   Medication Sig Start Date End Date Taking? Authorizing Provider  GENVOYA 150-150-200-10 MG TABS tablet TAKE 1 TABLET BY MOUTH DAILY WITH BREAKFAST. Patient taking differently: Take 1 tablet by mouth daily with breakfast.  09/02/18   Comer, Okey Regal, MD  glucose blood (ACCU-CHEK ACTIVE STRIPS) test strip Use as instructed 05/03/18   Katy Fitch, MD  glucose blood test strip Check your sugar in the morning before you eat breakfast, and one hour after a meal. 05/26/19   Melynda Ripple, MD  glucose monitoring kit (FREESTYLE) monitoring kit 1 each by Does not apply route daily. Check glucose once in the morning before breakfast and 1 hour after a meal 05/26/19   Melynda Ripple, MD  ibuprofen (ADVIL) 600 MG tablet Take  1 tablet (600 mg total) by mouth every 6 (six) hours as needed. 11/04/19   Faustino Congress, NP  metFORMIN (GLUCOPHAGE) 500 MG tablet Take 2 tablets (1,000 mg total) by mouth 2 (two) times daily with a meal. 03/22/17 09/08/26  Ledell Noss, MD  methocarbamol (ROBAXIN) 500 MG tablet Take 1 tablet (500 mg total) by mouth 2 (two) times daily. 11/04/19   Faustino Congress, NP  sitaGLIPtin (JANUVIA) 100 MG tablet Take 1 tablet (100 mg total) by mouth daily. 08/05/17   Neva Seat, MD    Family History Family  History  Problem Relation Age of Onset  . Healthy Mother   . Healthy Father     Social History Social History   Tobacco Use  . Smoking status: Never Smoker  . Smokeless tobacco: Never Used  Substance Use Topics  . Alcohol use: No  . Drug use: No     Allergies   Patient has no known allergies.   Review of Systems Review of Systems  Constitutional: Positive for fatigue. Negative for activity change, appetite change, chills and fever.  HENT: Negative for congestion, ear pain, postnasal drip, rhinorrhea, sinus pressure, sinus pain, sneezing and sore throat.   Eyes: Negative for pain and visual disturbance.  Respiratory: Negative for cough and shortness of breath.   Cardiovascular: Negative for chest pain and palpitations.  Gastrointestinal: Negative for abdominal pain, diarrhea, nausea and vomiting.  Endocrine: Negative.   Genitourinary: Negative for difficulty urinating, dyspareunia, dysuria, flank pain, frequency, hematuria, menstrual problem, vaginal bleeding, vaginal discharge and vaginal pain.  Musculoskeletal: Positive for myalgias. Negative for arthralgias and back pain.       Upper middle back  Skin: Negative for color change and rash.  Allergic/Immunologic: Negative.   Neurological: Negative for seizures and syncope.  Hematological: Negative.   Psychiatric/Behavioral: Negative.   All other systems reviewed and are negative.    Physical Exam Triage Vital Signs ED Triage Vitals [11/04/19 1402]  Enc Vitals Group     BP (!) 156/97     Pulse Rate 89     Resp 18     Temp 99.1 F (37.3 C)     Temp Source Oral     SpO2 100 %     Weight      Height      Head Circumference      Peak Flow      Pain Score 6     Pain Loc      Pain Edu?      Excl. in [place]?    No data found.  Updated Vital Signs BP (!) 156/97 (BP Location: Right Arm)   Pulse 89   Temp 99.1 F (37.3 C) (Oral)   Resp 18   SpO2 100%      Physical Exam Vitals and nursing note reviewed.    Constitutional:      General: She is not in acute distress.    Appearance: Normal appearance. She is well-developed. She is obese. She is not ill-appearing.  HENT:     Head: Normocephalic and atraumatic.     Nose: Nose normal.     Mouth/Throat:     Mouth: Mucous membranes are moist.     Pharynx: Oropharynx is clear.  Eyes:     Conjunctiva/sclera: Conjunctivae normal.  Cardiovascular:     Rate and Rhythm: Normal rate and regular rhythm.     Heart sounds: Normal heart sounds. No murmur.  Pulmonary:     Effort: Pulmonary effort is normal.  No respiratory distress.     Breath sounds: Normal breath sounds. No stridor. No wheezing, rhonchi or rales.  Chest:     Chest wall: No tenderness.  Abdominal:     General: Bowel sounds are normal. There is no distension.     Palpations: Abdomen is soft. There is no mass.     Tenderness: There is no abdominal tenderness. There is no guarding or rebound.     Hernia: No hernia is present.  Musculoskeletal:        General: Swelling and tenderness present.       Arms:     Cervical back: Neck supple.     Comments: Areas of tenderness and swelling.  Skin:    General: Skin is warm and dry.     Capillary Refill: Capillary refill takes less than 2 seconds.  Neurological:     General: No focal deficit present.     Mental Status: She is alert and oriented to person, place, and time.  Psychiatric:        Mood and Affect: Mood normal.        Behavior: Behavior normal.      UC Treatments / Results  Labs (all labs ordered are listed, but only abnormal results are displayed) Labs Reviewed  POCT URINALYSIS DIP (DEVICE) - Abnormal; Notable for the following components:      Result Value   Glucose, UA >=1000 (*)    Hgb urine dipstick TRACE (*)    All other components within normal limits    EKG   Radiology No results found.  Procedures Procedures (including critical care time)  Medications Ordered in UC Medications - No data to  display  Initial Impression / Assessment and Plan / UC Course  I have reviewed the triage vital signs and the nursing notes.  Pertinent labs & imaging results that were available during my care of the patient were reviewed by me and considered in my medical decision making (see chart for details).  Clinical Course as of Nov 05 2111  Sat Nov 04, 2019  1426 POCT Urinalysis, Dipstick [SM]    Clinical Course User Index [SM] Faustino Congress, NP    Presents with acute midline thoracic back pain.  Muscles on either side of the spine in this area are both tender and swollen.  UA in office today negative for infection.  No need for culture, no suspicion for infection.  Likely etiology is musculoskeletal pain.  Prescribed ibuprofen 800 mg every 8 hours as needed for pain.  Also prescribed Robaxin 500 mg twice daily as needed muscle spasms.  Instructed patient that she needs to give her back a little bit of rest at this time.  Instructed that she may need to follow-up with this office or with primary care.  If pain persists and symptoms are not improving she may need to follow-up with orthopedics or may need to do some physical therapy.  Instructed patient that she may also use topical rubs as needed for comfort, and ice or heat as needed for comfort as well.  Instructed patient to report to the emergency room for further evaluation of symptoms such as difficulty swallowing, difficulty breathing, other concerning symptoms. Final Clinical Impressions(s) / UC Diagnoses   Final diagnoses:  Acute midline thoracic back pain     Discharge Instructions     Take the prescribed ibuprofen as needed for your pain.  Take the muscle relaxer Flexeril as needed for muscle spasm; do not drive, operate machinery,  or drink alcohol with this medication as it may make you drowsy.    Follow up with an orthopedist if your pain is not improving.  Go to the emergency department if you have worsening pain or develop new  symptoms such as difficulty with urination, weakness, numbness, loss of control of your bladder or bowels, fever, chills or other concerns.     ED Prescriptions    Medication Sig Dispense Auth. Provider   ibuprofen (ADVIL) 600 MG tablet Take 1 tablet (600 mg total) by mouth every 6 (six) hours as needed. 30 tablet Faustino Congress, NP   methocarbamol (ROBAXIN) 500 MG tablet Take 1 tablet (500 mg total) by mouth 2 (two) times daily. 20 tablet Faustino Congress, NP     PDMP not reviewed this encounter.   Faustino Congress, NP 11/05/19 2115

## 2019-11-10 ENCOUNTER — Encounter (HOSPITAL_COMMUNITY): Payer: Self-pay

## 2019-11-10 ENCOUNTER — Emergency Department (HOSPITAL_COMMUNITY)
Admission: EM | Admit: 2019-11-10 | Discharge: 2019-11-10 | Disposition: A | Discharge status: Home / Self Care | Payer: Medicaid Other | Attending: Emergency Medicine | Admitting: Emergency Medicine

## 2019-11-10 ENCOUNTER — Other Ambulatory Visit: Payer: Self-pay

## 2019-11-10 DIAGNOSIS — E119 Type 2 diabetes mellitus without complications: Secondary | ICD-10-CM | POA: Insufficient documentation

## 2019-11-10 DIAGNOSIS — B20 Human immunodeficiency virus [HIV] disease: Secondary | ICD-10-CM | POA: Insufficient documentation

## 2019-11-10 DIAGNOSIS — Z7984 Long term (current) use of oral hypoglycemic drugs: Secondary | ICD-10-CM | POA: Insufficient documentation

## 2019-11-10 DIAGNOSIS — B379 Candidiasis, unspecified: Secondary | ICD-10-CM

## 2019-11-10 DIAGNOSIS — R300 Dysuria: Secondary | ICD-10-CM | POA: Diagnosis present

## 2019-11-10 DIAGNOSIS — N3001 Acute cystitis with hematuria: Secondary | ICD-10-CM

## 2019-11-10 DIAGNOSIS — R600 Localized edema: Secondary | ICD-10-CM | POA: Diagnosis not present

## 2019-11-10 DIAGNOSIS — B373 Candidiasis of vulva and vagina: Secondary | ICD-10-CM | POA: Diagnosis not present

## 2019-11-10 DIAGNOSIS — Z79899 Other long term (current) drug therapy: Secondary | ICD-10-CM | POA: Diagnosis not present

## 2019-11-10 DIAGNOSIS — I10 Essential (primary) hypertension: Secondary | ICD-10-CM | POA: Diagnosis not present

## 2019-11-10 LAB — URINALYSIS, ROUTINE W REFLEX MICROSCOPIC
Bilirubin Urine: NEGATIVE
Glucose, UA: >=500 mg/dL — AB
Ketones, ur: NEGATIVE mg/dL
Nitrite: NEGATIVE
Protein, ur: NEGATIVE mg/dL
RBC / HPF: >50 RBC/hpf — ABNORMAL HIGH (ref 0–5)
Specific Gravity, Urine: 1.035 — ABNORMAL HIGH (ref 1.005–1.030)
pH: 6 (ref 5.0–8.0)

## 2019-11-10 LAB — PREGNANCY, URINE: Preg Test, Ur: NEGATIVE

## 2019-11-10 MED ORDER — CEPHALEXIN 500 MG PO CAPS: 500.0000 mg | ORAL_CAPSULE | Freq: Two times a day (BID) | ORAL | 0 refills | Status: AC

## 2019-11-10 MED ORDER — FLUCONAZOLE 200 MG PO TABS: 200.0000 mg | ORAL_TABLET | Freq: Once | ORAL | 0 refills | Status: AC

## 2019-11-10 MED ORDER — FLUCONAZOLE 150 MG PO TABS
150.0000 mg | ORAL_TABLET | Freq: Once | ORAL | Status: AC
  Administered 2019-11-10: 150 mg via ORAL
  Filled 2019-11-10: qty 1

## 2019-11-10 MED ORDER — CEPHALEXIN 250 MG PO CAPS
500.0000 mg | ORAL_CAPSULE | Freq: Once | ORAL | Status: AC
  Administered 2019-11-10: 500 mg via ORAL
  Filled 2019-11-10: qty 2

## 2019-11-10 NOTE — ED Triage Notes (Signed)
Pt reports dysuria and hematuria for the past week, pt seen at [facility] for same and was told she did not have an infection but is still having symptoms. Pt denies abd pain.

## 2019-11-10 NOTE — ED Provider Notes (Signed)
[facility] EMERGENCY DEPARTMENT Provider Note   CSN: [id number] Arrival date & time: 11/10/19  2233     History Chief Complaint  Patient presents with  . Dysuria  . Hematuria    Beverly Johnson is a 44 y.o. female w/ hx of HIV (on medications, CD4 count > 700 earlier this month), diabetes, presenting to the ED with dysuria and hematuria for the past several days.  She was seen an urgent care where she had a POC UA that had no leuks, no nitrites, but noted to have high glucose, trace hgb.  She reports she feels she has a yeast infection, with some burning and itching near her vagina.  She is also having discomfort with urination, and sees a little bit of blood when she wipes.  She has had yeast infections before.  She reports she is diabetic but compliant with her medications.  She does not feel her BS is high.  She denies fevers, chills, flank pain, or any other systemic symptoms.  Patient diagnosed with COVID-19 in October 2020  HPI     Past Medical History:  Diagnosis Date  . Diabetes mellitus without complication ([place])   . Gallstones 03/10/2011  . HIV infection ([place])   . Immune deficiency disorder [facility])     Patient Active Problem List   Diagnosis Date Noted  . Hyperglycemia 06/16/2019  . History of 2019 novel coronavirus disease (COVID-19) 06/16/2019  . Dehydration 06/16/2019  . AKI (acute kidney injury) ([place]) 06/16/2019  . Candidal esophagitis ([place]) 06/16/2019  . Abnormal ECG 06/16/2019  . Acute respiratory disease due to COVID-19 virus 06/02/2019  . Medication monitoring encounter 11/12/2017  . Breast nodule 09/02/2017  . Screening examination for venereal disease 03/30/2017  . Encounter for long-term (current) use of high-risk medication 03/30/2017  . Microalbuminuria due to type 2 diabetes mellitus ([place]) 03/30/2017  . Knee pain, chronic 03/30/2017  . Vitamin D deficiency 03/21/2017  . Bilateral leg cramps 01/22/2017  . Healthcare  maintenance 03/02/2014  . Morbid obesity with BMI of 45.0-49.9, adult ([place]) 04/27/2012  . Uncontrolled type 2 diabetes mellitus ([place]) 12/08/2010  . Human immunodeficiency virus (HIV) disease ([place]) 09/29/2006    Past Surgical History:  Procedure Laterality Date  . CHOLECYSTECTOMY  06/29/2011   Procedure: LAPAROSCOPIC CHOLECYSTECTOMY WITH INTRAOPERATIVE CHOLANGIOGRAM;  Surgeon: Merrie Roof, MD;  Location: [place];  Service: General;  Laterality: N/A;  . NO PAST SURGERIES       OB History   No obstetric history on file.     Family History  Problem Relation Age of Onset  . Healthy Mother   . Healthy Father     Social History   Tobacco Use  . Smoking status: Never Smoker  . Smokeless tobacco: Never Used  Substance Use Topics  . Alcohol use: No  . Drug use: No    Home Medications Prior to Admission medications   Medication Sig Start Date End Date Taking? Authorizing Provider  cephALEXin (KEFLEX) 500 MG capsule Take 1 capsule (500 mg total) by mouth 2 (two) times daily for 5 days. 11/10/19 11/15/19  Wyvonnia Dusky, MD  fluconazole (DIFLUCAN) 200 MG tablet Take 1 tablet (200 mg total) by mouth once for 1 dose. If you continue having symptoms on 11/12/19 (burning, itching near the vagina) 11/10/19 11/10/19  Alayha Babineaux, Carola Rhine, MD  GENVOYA 150-150-200-10 MG TABS tablet TAKE 1 TABLET BY MOUTH DAILY WITH BREAKFAST. Patient taking differently: Take 1 tablet by mouth daily with  breakfast.  09/02/18   Comer, Okey Regal, MD  glucose blood test strip Check your sugar in the morning before you eat breakfast, and one hour after a meal. 05/26/19   Melynda Ripple, MD  glucose monitoring kit (FREESTYLE) monitoring kit 1 each by Does not apply route daily. Check glucose once in the morning before breakfast and 1 hour after a meal 05/26/19   Melynda Ripple, MD  ibuprofen (ADVIL) 600 MG tablet Take 1 tablet (600 mg total) by mouth every 6 (six) hours as needed. Patient taking differently: Take 600  mg by mouth every 6 (six) hours as needed for mild pain.  11/04/19   Faustino Congress, NP  metFORMIN (GLUCOPHAGE) 500 MG tablet Take 2 tablets (1,000 mg total) by mouth 2 (two) times daily with a meal. 03/22/17 09/08/26  Ledell Noss, MD  methocarbamol (ROBAXIN) 500 MG tablet Take 1 tablet (500 mg total) by mouth 2 (two) times daily. 11/04/19   Faustino Congress, NP  sitaGLIPtin (JANUVIA) 100 MG tablet Take 1 tablet (100 mg total) by mouth daily. 08/05/17   Neva Seat, MD    Allergies    Patient has no known allergies.  Review of Systems   Review of Systems  Constitutional: Negative for chills and fever.  Respiratory: Negative for cough and shortness of breath.   Cardiovascular: Negative for chest pain and palpitations.  Gastrointestinal: Negative for abdominal pain, nausea and vomiting.  Genitourinary: Positive for dysuria. Negative for difficulty urinating and genital sores.  Skin: Negative for pallor and rash.  Neurological: Negative for syncope and headaches.  Psychiatric/Behavioral: Negative for agitation and confusion.  All other systems reviewed and are negative.   Physical Exam Updated Vital Signs BP 116/70   Pulse 95   Temp 98.6 F (37 C) (Oral)   Resp 16   Ht 5' 7" (1.702 m)   Wt 117.9 kg   LMP 10/11/2019   SpO2 100%   BMI 40.72 kg/m   Physical Exam Vitals and nursing note reviewed.  Constitutional:      General: She is not in acute distress.    Appearance: She is well-developed.  HENT:     Head: Normocephalic and atraumatic.  Cardiovascular:     Rate and Rhythm: Normal rate and regular rhythm.  Pulmonary:     Effort: Pulmonary effort is normal. No respiratory distress.  Abdominal:     Palpations: Abdomen is soft.     Tenderness: There is no abdominal tenderness.  Musculoskeletal:     Cervical back: Neck supple.  Skin:    General: Skin is warm and dry.  Neurological:     Mental Status: She is alert.  Psychiatric:        Mood and Affect: Mood  normal.        Behavior: Behavior normal.     ED Results / Procedures / Treatments   Labs (all labs ordered are listed, but only abnormal results are displayed) Labs Reviewed  URINALYSIS, ROUTINE W REFLEX MICROSCOPIC - Abnormal; Notable for the following components:      Result Value   APPearance HAZY (*)    Specific Gravity, Urine 1.035 (*)    Glucose, UA >=500 (*)    Hgb urine dipstick MODERATE (*)    Leukocytes,Ua SMALL (*)    RBC / HPF >50 (*)    Bacteria, UA FEW (*)    All other components within normal limits  URINE CULTURE  PREGNANCY, URINE    EKG None  Radiology No results found.  Procedures Procedures (including critical care time)  Medications Ordered in ED Medications  fluconazole (DIFLUCAN) tablet 150 mg (has no administration in time range)  cephALEXin (KEFLEX) capsule 500 mg (has no administration in time range)    ED Course  I have reviewed the triage vital signs and the nursing notes.  Pertinent labs & imaging results that were available during my care of the patient were reviewed by me and considered in my medical decision making (see chart for details).  44 yo female here with concern for yeast infection, dysuria for past several days. She is diabetic, higher risk for yeast infection.  Has had them in the past and this feels similar.  We'll recheck a UA here, I anticipate treating her for yeast infection and/or bacteria infection based on her symptomatology.  Doubtful of pyelonephritis, PID, urosepsis, or serious pelvic infection based on her presentation.  She is well appearing on exam.  CD4 count > 700 earlier this month, doubtful of AIDS defining illness Clinical Course as of Nov 09 1013  Fri Nov 10, 2019  1008 Discussed UA results with the patient.  Possible contaminant given many squamous cells were there.  However with her symptoms and the bacteria and leukocytes, I think is reasonable to treat her with 5 days of Keflex.  Also give her  fluconazole here for yeast infection, and will prescribed a second dose if she needs it in 3 days.   [MT]    Clinical Course User Index [MT] , Carola Rhine, MD    Final Clinical Impression(s) / ED Diagnoses Final diagnoses:  Acute cystitis with hematuria  Yeast infection    Rx / DC Orders ED Discharge Orders         Ordered    cephALEXin (KEFLEX) 500 MG capsule  2 times daily     11/10/19 1012    fluconazole (DIFLUCAN) 200 MG tablet   Once     11/10/19 1012           Wyvonnia Dusky, MD 11/10/19 1015

## 2019-11-10 NOTE — Discharge Instructions (Signed)
Your workup showed that you MAY have a urine infection.  Your sample may be contaminated with skin bacteria and not accurate. Your urine culture is pending, and will take 2-3 days for a final result.  In the meantime, you can complete a 5 day course of keflex for a possible UTI.  You were also treated for a yeast infection.  One dose given in the ER should clear up these symptoms.  However, if you continue having burning, itching near your vagina, take the 2nd dose of fluconazole in 3 days.  Finally, your urine shows sugar and protein in it, which is concerning for uncontrolled diabetes.  Please keep checking your blood sugars at home.  If they are high (above 200), you should be calling your primary care doctor to adjust your medications.  Having high blood sugar all the time puts you at risk for yeast infections, bacterial infections, and other long-term complications like heart, brain, and kidney disease.

## 2019-11-11 LAB — URINE CULTURE

## 2019-11-18 ENCOUNTER — Other Ambulatory Visit: Payer: Self-pay

## 2019-11-18 ENCOUNTER — Emergency Department (HOSPITAL_COMMUNITY)
Admission: EM | Admit: 2019-11-18 | Discharge: 2019-11-18 | Disposition: A | Discharge status: Home / Self Care | Payer: Medicaid Other | Attending: Emergency Medicine | Admitting: Emergency Medicine

## 2019-11-18 ENCOUNTER — Encounter (HOSPITAL_COMMUNITY): Payer: Self-pay

## 2019-11-18 DIAGNOSIS — R42 Dizziness and giddiness: Secondary | ICD-10-CM | POA: Insufficient documentation

## 2019-11-18 DIAGNOSIS — Z7984 Long term (current) use of oral hypoglycemic drugs: Secondary | ICD-10-CM | POA: Diagnosis not present

## 2019-11-18 DIAGNOSIS — E1165 Type 2 diabetes mellitus with hyperglycemia: Secondary | ICD-10-CM | POA: Diagnosis not present

## 2019-11-18 DIAGNOSIS — B20 Human immunodeficiency virus [HIV] disease: Secondary | ICD-10-CM | POA: Diagnosis not present

## 2019-11-18 DIAGNOSIS — R739 Hyperglycemia, unspecified: Secondary | ICD-10-CM

## 2019-11-18 LAB — URINALYSIS, ROUTINE W REFLEX MICROSCOPIC
Bacteria, UA: NONE SEEN
Bilirubin Urine: NEGATIVE
Glucose, UA: >=500 mg/dL — AB
Ketones, ur: NEGATIVE mg/dL
Leukocytes,Ua: NEGATIVE
Nitrite: NEGATIVE
Protein, ur: NEGATIVE mg/dL
Specific Gravity, Urine: 1.033 — ABNORMAL HIGH (ref 1.005–1.030)
pH: 6 (ref 5.0–8.0)

## 2019-11-18 LAB — BASIC METABOLIC PANEL
Anion gap: 9 (ref 5–15)
BUN: 7 mg/dL (ref 6–20)
CO2: 25 mmol/L (ref 22–32)
Calcium: 9.1 mg/dL (ref 8.9–10.3)
Chloride: 102 mmol/L (ref 98–111)
Creatinine, Ser: 0.78 mg/dL (ref 0.44–1.00)
GFR calc Af Amer: >60 mL/min (ref >60)
GFR calc non Af Amer: >60 mL/min (ref >60)
Glucose, Bld: 407 mg/dL — ABNORMAL HIGH (ref 70–99)
Potassium: 4.1 mmol/L (ref 3.5–5.1)
Sodium: 136 mmol/L (ref 135–145)

## 2019-11-18 LAB — CBC
HCT: 34.7 % — ABNORMAL LOW (ref 36.0–46.0)
Hemoglobin: 10 g/dL — ABNORMAL LOW (ref 12.0–15.0)
MCH: 21.7 pg — ABNORMAL LOW (ref 26.0–34.0)
MCHC: 28.8 g/dL — ABNORMAL LOW (ref 30.0–36.0)
MCV: 75.3 fL — ABNORMAL LOW (ref 80.0–100.0)
Platelets: 212 10*3/uL (ref 150–400)
RBC: 4.61 MIL/uL (ref 3.87–5.11)
RDW: 14.7 % (ref 11.5–15.5)
WBC: 4.5 10*3/uL (ref 4.0–10.5)
nRBC: 0 % (ref 0.0–0.2)

## 2019-11-18 LAB — CBG MONITORING, ED
Glucose-Capillary: 364 mg/dL — ABNORMAL HIGH (ref 70–99)
Glucose-Capillary: 327 mg/dL — ABNORMAL HIGH (ref 70–99)

## 2019-11-18 LAB — I-STAT BETA HCG BLOOD, ED (MC, WL, AP ONLY): I-stat hCG, quantitative: <5 m[IU]/mL (ref <5)

## 2019-11-18 MED ORDER — SODIUM CHLORIDE 0.9% FLUSH: 3.0000 mL | Freq: Once | INTRAVENOUS | Status: DC

## 2019-11-18 NOTE — ED Provider Notes (Signed)
[facility] EMERGENCY DEPARTMENT Provider Note   CSN: [id number] Arrival date & time: 11/18/19  1417     History No chief complaint on file.   Beverly Johnson is a 44 y.o. female who  has a past medical history of Diabetes mellitus without complication ([place]), Gallstones (03/10/2011), HIV infection ([place]), and Immune deficiency disorder ([place]).  Patient's last viral load undetectable with high CD4 count   The history is provided by the patient. No language interpreter was used.  Dizziness Quality:  Lightheadedness Severity:  Mild Onset quality:  Gradual Duration:  1 day Timing:  Intermittent Progression:  Improving Chronicity:  New Context: standing up   Context: not when bending over, not with head movement, not with inactivity, not with loss of consciousness, not with medication, not with physical activity and not when urinating        Past Medical History:  Diagnosis Date  . Diabetes mellitus without complication ([place])   . Gallstones 03/10/2011  . HIV infection ([place])   . Immune deficiency disorder [facility])     Patient Active Problem List   Diagnosis Date Noted  . Hyperglycemia 06/16/2019  . History of 2019 novel coronavirus disease (COVID-19) 06/16/2019  . Dehydration 06/16/2019  . AKI (acute kidney injury) ([place]) 06/16/2019  . Candidal esophagitis ([place]) 06/16/2019  . Abnormal ECG 06/16/2019  . Acute respiratory disease due to COVID-19 virus 06/02/2019  . Medication monitoring encounter 11/12/2017  . Breast nodule 09/02/2017  . Screening examination for venereal disease 03/30/2017  . Encounter for long-term (current) use of high-risk medication 03/30/2017  . Microalbuminuria due to type 2 diabetes mellitus ([place]) 03/30/2017  . Knee pain, chronic 03/30/2017  . Vitamin D deficiency 03/21/2017  . Bilateral leg cramps 01/22/2017  . Healthcare maintenance 03/02/2014  . Morbid obesity with BMI of 45.0-49.9, adult ([place]) 04/27/2012  . Uncontrolled type 2  diabetes mellitus ([place]) 12/08/2010  . Human immunodeficiency virus (HIV) disease ([place]) 09/29/2006    Past Surgical History:  Procedure Laterality Date  . CHOLECYSTECTOMY  06/29/2011   Procedure: LAPAROSCOPIC CHOLECYSTECTOMY WITH INTRAOPERATIVE CHOLANGIOGRAM;  Surgeon: Merrie Roof, MD;  Location: [place];  Service: General;  Laterality: N/A;  . NO PAST SURGERIES       OB History   No obstetric history on file.     Family History  Problem Relation Age of Onset  . Healthy Mother   . Healthy Father     Social History   Tobacco Use  . Smoking status: Never Smoker  . Smokeless tobacco: Never Used  Substance Use Topics  . Alcohol use: No  . Drug use: No    Home Medications Prior to Admission medications   Medication Sig Start Date End Date Taking? Authorizing Provider  GENVOYA 150-150-200-10 MG TABS tablet TAKE 1 TABLET BY MOUTH DAILY WITH BREAKFAST. Patient taking differently: Take 1 tablet by mouth daily with breakfast.  09/02/18  Yes Comer, Okey Regal, MD  glucose blood test strip Check your sugar in the morning before you eat breakfast, and one hour after a meal. 05/26/19  Yes Melynda Ripple, MD  glucose monitoring kit (FREESTYLE) monitoring kit 1 each by Does not apply route daily. Check glucose once in the morning before breakfast and 1 hour after a meal 05/26/19  Yes Melynda Ripple, MD  ibuprofen (ADVIL) 600 MG tablet Take 1 tablet (600 mg total) by mouth every 6 (six) hours as needed. Patient taking differently: Take 600 mg by mouth every 6 (six) hours as  needed for mild pain.  11/04/19  Yes Faustino Congress, NP  metFORMIN (GLUCOPHAGE) 500 MG tablet Take 2 tablets (1,000 mg total) by mouth 2 (two) times daily with a meal. 03/22/17 09/08/26 Yes Ledell Noss, MD  methocarbamol (ROBAXIN) 500 MG tablet Take 1 tablet (500 mg total) by mouth 2 (two) times daily. 11/04/19  Yes Faustino Congress, NP  sitaGLIPtin (JANUVIA) 100 MG tablet Take 1 tablet (100 mg total) by mouth daily.  08/05/17  Yes Neva Seat, MD    Allergies    Patient has no known allergies.  Review of Systems   Review of Systems  Constitutional: Negative.   HENT: Negative.   Gastrointestinal: Negative.   Neurological: Positive for dizziness.  Ten systems reviewed and are negative for acute change, except as noted in the HPI.    Physical Exam Updated Vital Signs BP 109/78   Pulse 80   Temp 98.9 F (37.2 C) (Oral)   Resp 19   LMP 11/13/2019   SpO2 100%   Physical Exam Physical Exam  Nursing note and vitals reviewed. Constitutional: She is oriented to person, place, and time. She appears well-developed and well-nourished. No distress.  HENT:  Head: Normocephalic and atraumatic.  Eyes: Conjunctivae normal and EOM are normal. Pupils are equal, round, and reactive to light. No scleral icterus.  Neck: Normal range of motion.  Cardiovascular: Normal rate, regular rhythm and normal heart sounds.  Exam reveals no gallop and no friction rub.   No murmur heard. Pulmonary/Chest: Effort normal and breath sounds normal. No respiratory distress.  Abdominal: Soft. Bowel sounds are normal. She exhibits no distension and no mass. There is no tenderness. There is no guarding.  Neurological: She is alert and oriented to person, place, and time.  Skin: Skin is warm and dry. She is not diaphoretic.    ED Results / Procedures / Treatments   Labs (all labs ordered are listed, but only abnormal results are displayed) Labs Reviewed  BASIC METABOLIC PANEL - Abnormal; Notable for the following components:      Result Value   Glucose, Bld 407 (*)    All other components within normal limits  CBC - Abnormal; Notable for the following components:   Hemoglobin 10.0 (*)    HCT 34.7 (*)    MCV 75.3 (*)    MCH 21.7 (*)    MCHC 28.8 (*)    All other components within normal limits  URINALYSIS, ROUTINE W REFLEX MICROSCOPIC - Abnormal; Notable for the following components:   APPearance CLOUDY (*)     Specific Gravity, Urine 1.033 (*)    Glucose, UA >=500 (*)    Hgb urine dipstick LARGE (*)    All other components within normal limits  CBG MONITORING, ED - Abnormal; Notable for the following components:   Glucose-Capillary 364 (*)    All other components within normal limits  CBG MONITORING, ED - Abnormal; Notable for the following components:   Glucose-Capillary 327 (*)    All other components within normal limits  I-STAT BETA HCG BLOOD, ED ([facility], [facility], [facility] ONLY)    EKG EKG Interpretation  Date/Time:  Saturday November 18 2019 14:25:43 EDT Ventricular Rate:  81 PR Interval:  156 QRS Duration: 76 QT Interval:  342 QTC Calculation: 397 R Axis:   43 Text Interpretation: Normal sinus rhythm T wave abnormality, consider inferior ischemia , new since last tracing Abnormal ECG Confirmed by Dorie Rank [phone]) on 11/18/2019 8:27:10 PM   Radiology No results found.  Procedures  Procedures (including critical care time)  Medications Ordered in ED Medications  sodium chloride flush (NS) 0.9 % injection 3 mL (3 mLs Intravenous Not Given 11/18/19 1515)    ED Course  I have reviewed the triage vital signs and the nursing notes.  Pertinent labs & imaging results that were available during my care of the patient were reviewed by me and considered in my medical decision making (see chart for details).    MDM Rules/Calculators/A&P                         CC: Lightheadedness VS: BP 109/78   Pulse 80   Temp 98.9 F (37.2 C) (Oral)   Resp 19   LMP 11/13/2019   SpO2 100%   OL:IDCVUDT is gathered by patient and EMR. Previous records obtained and reviewed. DDX:The patient's complaint of lightheadedness and weakness involves an extensive number of diagnostic and treatment options, and is a complaint that carries with it a high risk of complications, morbidity, and potential mortality. Given the large differential diagnosis, medical decision making is of high complexity. The differential  diagnosis of weakness includes but is not limited to neurologic causes (GBS, myasthenia gravis, CVA, MS, ALS, transverse myelitis, spinal cord injury, CVA, botulism, ) and other causes: ACS, Arrhythmia, syncope, orthostatic hypotension, sepsis, hypoglycemia, electrolyte disturbance, hypothyroidism, respiratory failure, symptomatic anemia, dehydration, heat injury, polypharmacy, malignancy.  Labs: I ordered reviewed and interpreted labs which include CBG which shows an elevated blood glucose, this is reconfirmed on patient's BMP which shows a glucose of 4 7 without anion gap acidosis.  Her hCG is negative.  Urinalysis shows glucose urea and large hemoglobin without evidence of infection, no bacteria present.  CBC shows normocytic anemia which is chronic for the patient and is at baseline. Imaging: N/A EKG: EKG shows normal sinus rhythm at a rate of 81 Consults: N/A MDM: Patient here with complaint of lightheadedness onset today.  She has no chest pain or shortness of breath.  She is a history of HIV and diabetes.  EKG shows sinus rhythm.  She has some new T wave inversions but no chest pain, nausea, vomiting.  I have very low suspicion that this is ACS.  The patient does not wish to stay any longer for any further work-up.  She has negative orthostatic vital signs.  I offered fluids and the patient is declining.  She states that she feels better.  She does not have any evidence of a urinary tract infection.  I feel the patient is fine to be discharged at this time return for any new or worsening symptoms and close follow-up with her PCP Patient disposition: Discharge The patient appears reasonably screened and/or stabilized for discharge and I doubt any other medical condition or other [facility] requiring further screening, evaluation, or treatment in the ED at this time prior to discharge. I have discussed lab and/or imaging findings with the patient and answered all questions/concerns to the best of my ability.I  have discussed return precautions and OP follow up.      Final Clinical Impression(s) / ED Diagnoses Final diagnoses:  Hyperglycemia  Episodic lightheadedness    Rx / DC Orders ED Discharge Orders    None       Margarita Mail, PA-C 11/18/19 2206    Dorie Rank, MD 11/19/19 1059

## 2019-11-18 NOTE — Discharge Instructions (Addendum)
Contact a health care provider if:  Your dizziness does not go away.  Your dizziness or light-headedness gets worse.  You feel nauseous.  You have reduced hearing.  You have new symptoms.  You are unsteady on your feet or you feel like the room is spinning.  Get help right away if:  You vomit or have diarrhea and are unable to eat or drink anything.  You have problems talking, walking, swallowing, or using your arms, hands, or legs.  You feel generally weak.  You are not thinking clearly or you have trouble forming sentences. It may take a friend or family member to notice this.  You have chest pain, abdominal pain, shortness of breath, or sweating.  Your vision changes.  You have any bleeding.  You have a severe headache.  You have neck pain or a stiff neck.  You have a fever.

## 2019-11-18 NOTE — ED Notes (Signed)
Pt verbalized understanding of discharge instructions. Follow up care and diabetes management reviewed, pt had no further questions.

## 2019-11-18 NOTE — ED Triage Notes (Signed)
Onset today dizziness, nausea, and seeing spots in vision.  Ambulated to triage without difficulty.   Pt states "I think my blood sugar is high:".  Reports takes medications and hasn't missed any doses.

## 2019-11-20 ENCOUNTER — Encounter: Payer: Self-pay | Admitting: Internal Medicine

## 2019-11-20 ENCOUNTER — Other Ambulatory Visit: Payer: Self-pay

## 2019-11-20 ENCOUNTER — Ambulatory Visit (INDEPENDENT_AMBULATORY_CARE_PROVIDER_SITE_OTHER): Payer: Medicaid Other | Admitting: Internal Medicine

## 2019-11-20 VITALS — BP 122/80 | HR 84 | Temp 98.2°F | Ht 67.0 in | Wt 251.0 lb

## 2019-11-20 DIAGNOSIS — B20 Human immunodeficiency virus [HIV] disease: Secondary | ICD-10-CM

## 2019-11-20 DIAGNOSIS — E1165 Type 2 diabetes mellitus with hyperglycemia: Secondary | ICD-10-CM | POA: Diagnosis not present

## 2019-11-20 DIAGNOSIS — Z113 Encounter for screening for infections with a predominantly sexual mode of transmission: Secondary | ICD-10-CM

## 2019-11-22 ENCOUNTER — Encounter: Payer: Self-pay | Admitting: Internal Medicine

## 2019-11-22 NOTE — Progress Notes (Signed)
   Subjective:    Patient ID: Beverly Johnson, female    DOB: 07-Jan-1976, 44 y.o.   MRN: [id number]  HPI Follow up for HIV She has been on Genvoya and denies any missed doses. CD4 of 724, viral load < 20.  No complaints today.  No associated n/v/d.  Monitors her sugars on her own, does not want to go to her PCP.   Review of Systems  Constitutional: Negative for fatigue.  Gastrointestinal: Negative for diarrhea and nausea.  Skin: Negative for rash.       Objective:   Physical Exam Eyes:     General: No scleral icterus. Cardiovascular:     Rate and Rhythm: Normal rate and regular rhythm.  Pulmonary:     Effort: Pulmonary effort is normal.  Skin:    Findings: No rash.  Neurological:     Mental Status: She is alert.           Assessment & Plan:

## 2019-11-22 NOTE — Assessment & Plan Note (Addendum)
She is doing well with this, no changes and rtc in 6 months.  Refuses vaccines

## 2019-11-22 NOTE — Assessment & Plan Note (Signed)
I encouraged her to get to her PCP.  I discussed the risks of renal failure, heart issues, vision with poorly controlled DM.

## 2019-11-24 IMAGING — CR DG KNEE COMPLETE 4+V*R*
4 series · 4 of 4 positions shown · non-contrast
Comparison: None.

CLINICAL DATA: Acute right knee pain without known injury.

EXAM:
RIGHT KNEE - COMPLETE 4+ VIEW

[t knee ap right]
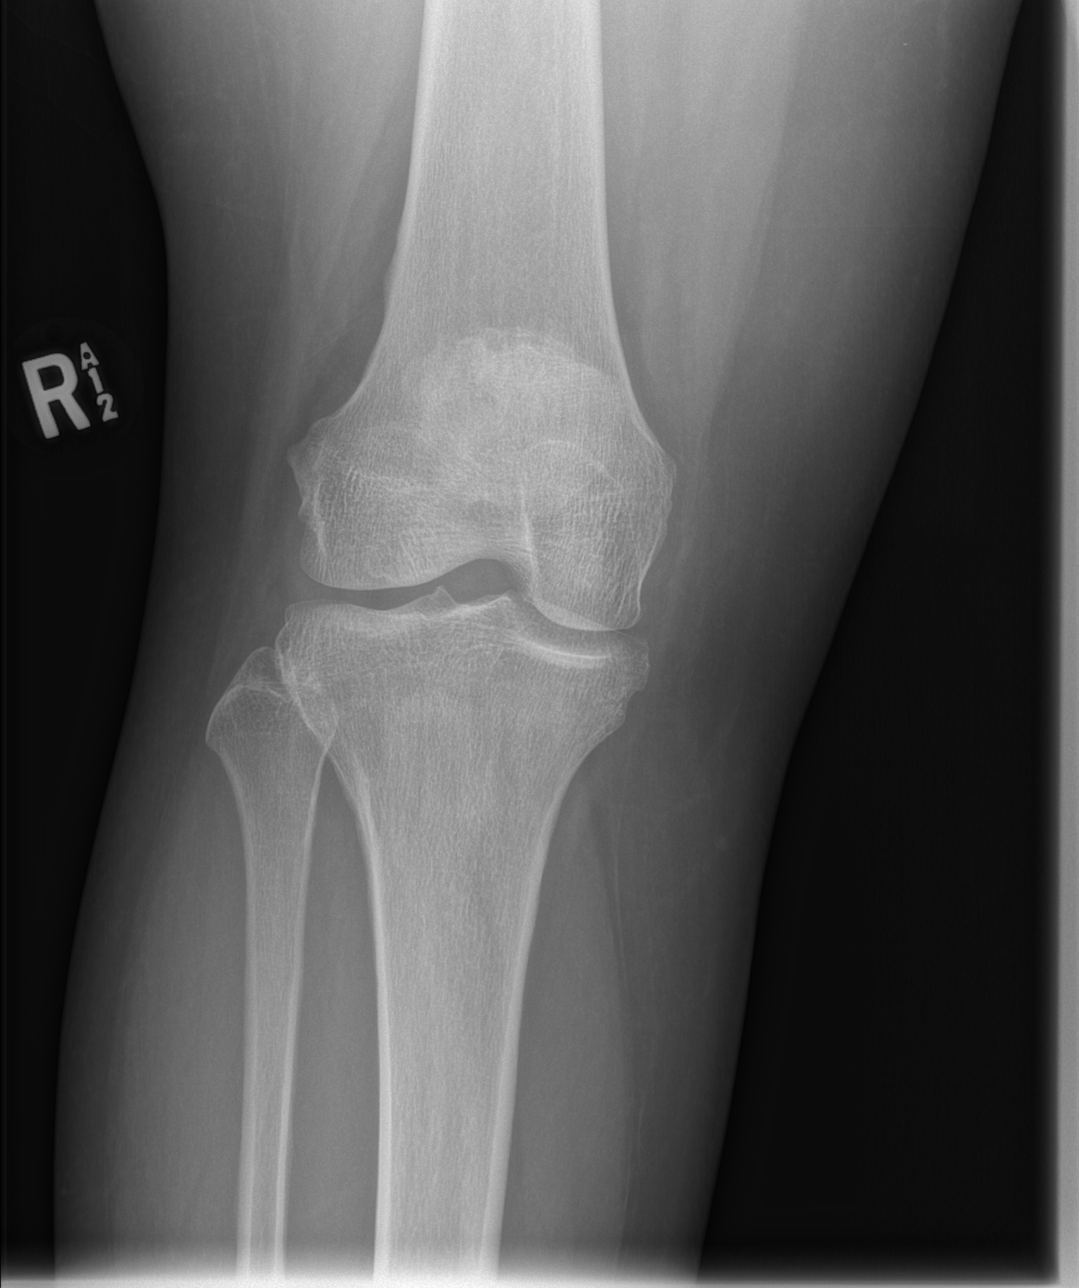

[t knee obl right (1 of 2)]
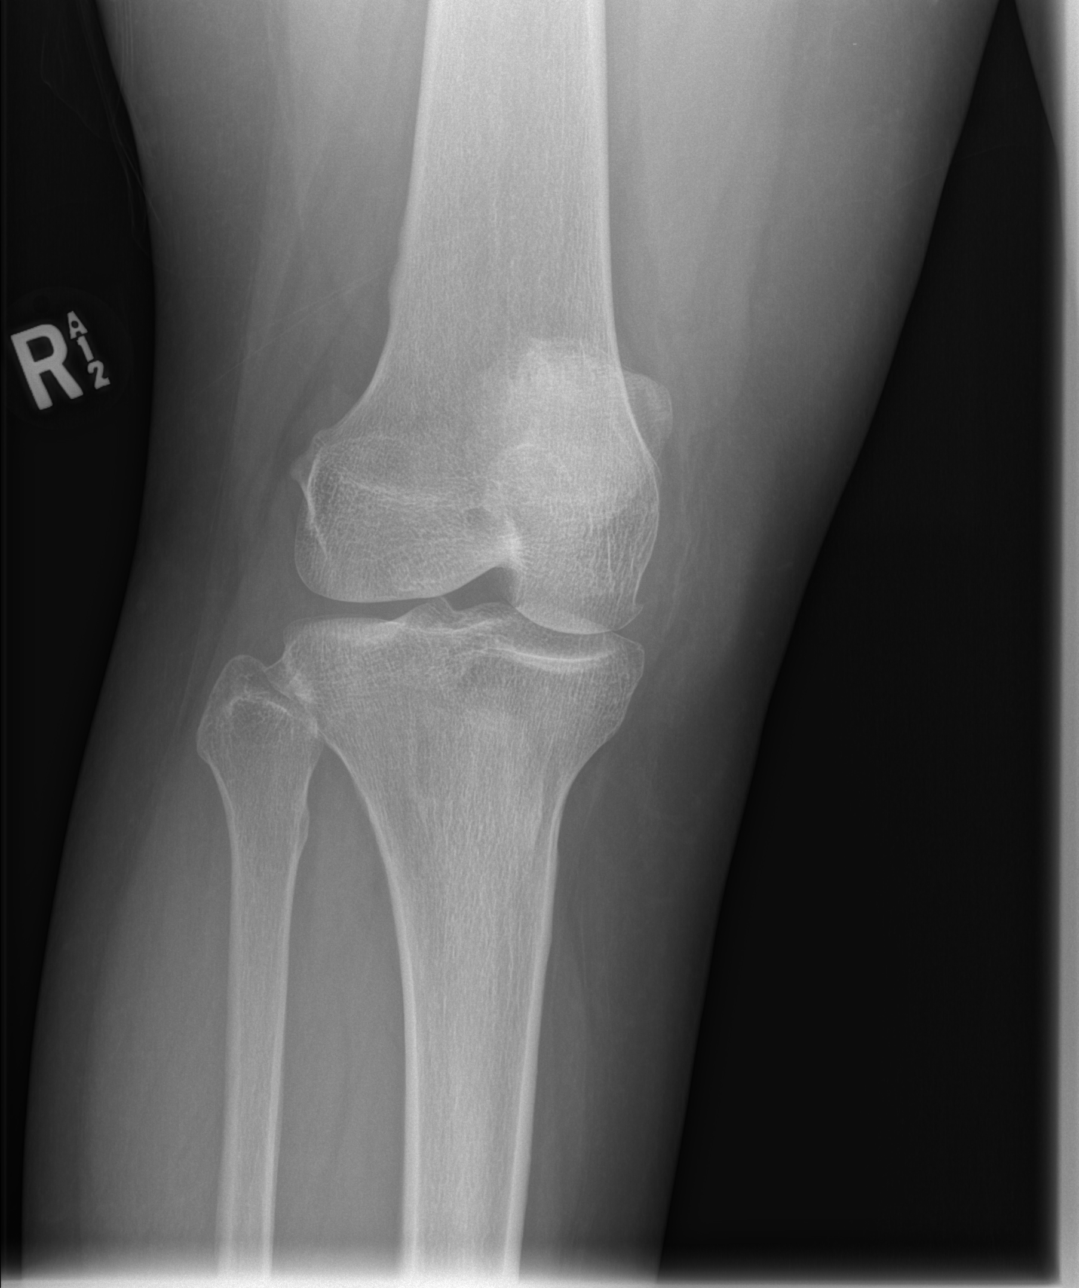

[t knee obl right (2 of 2)]
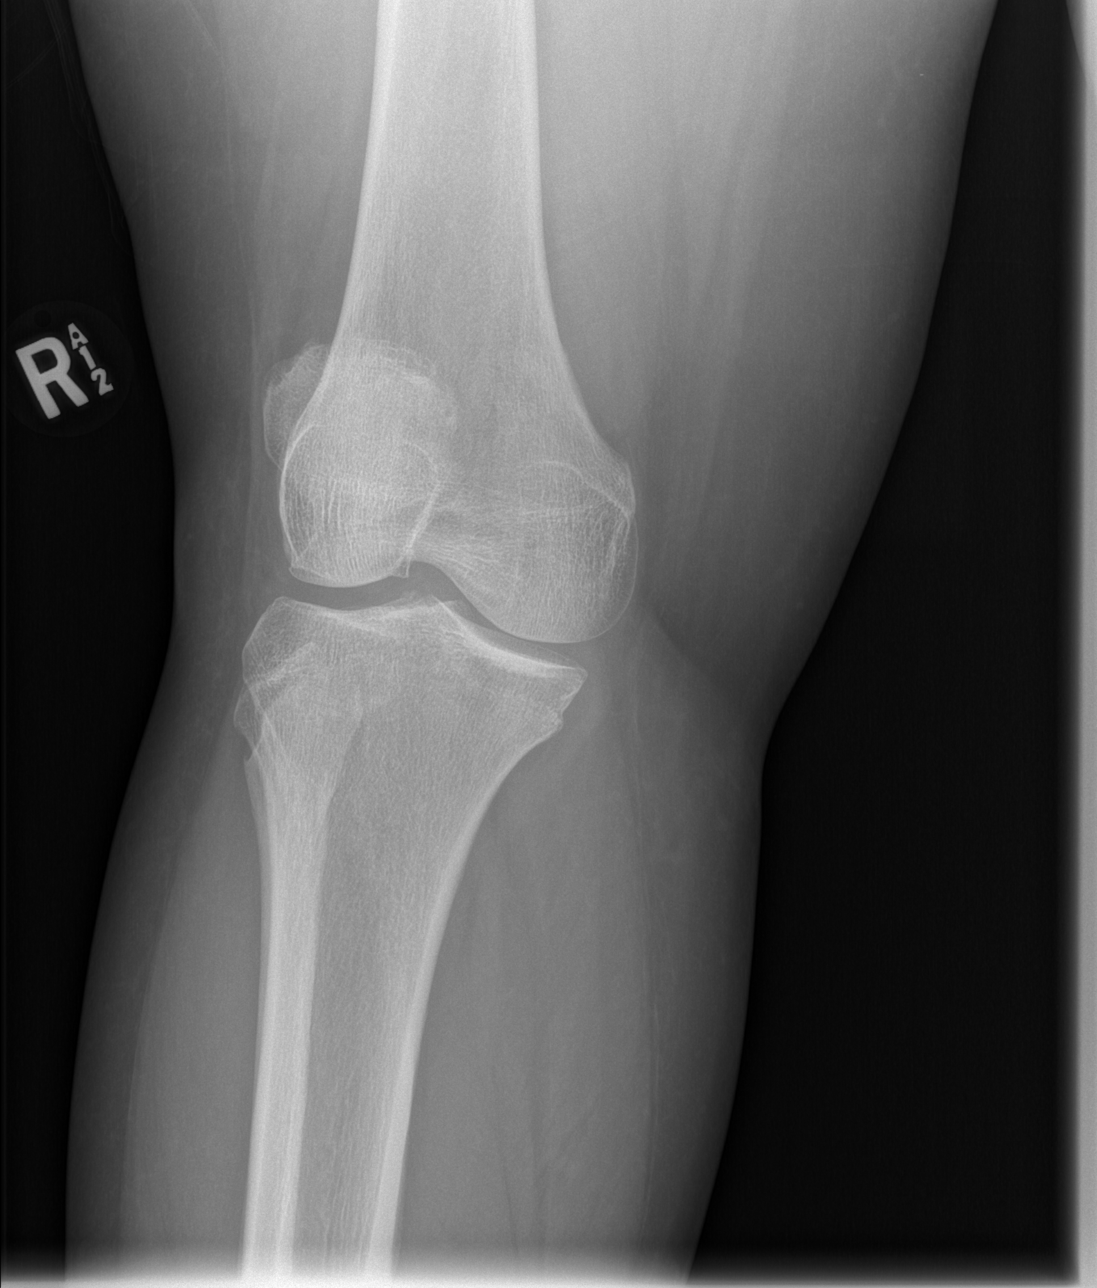

[t knee lat right]
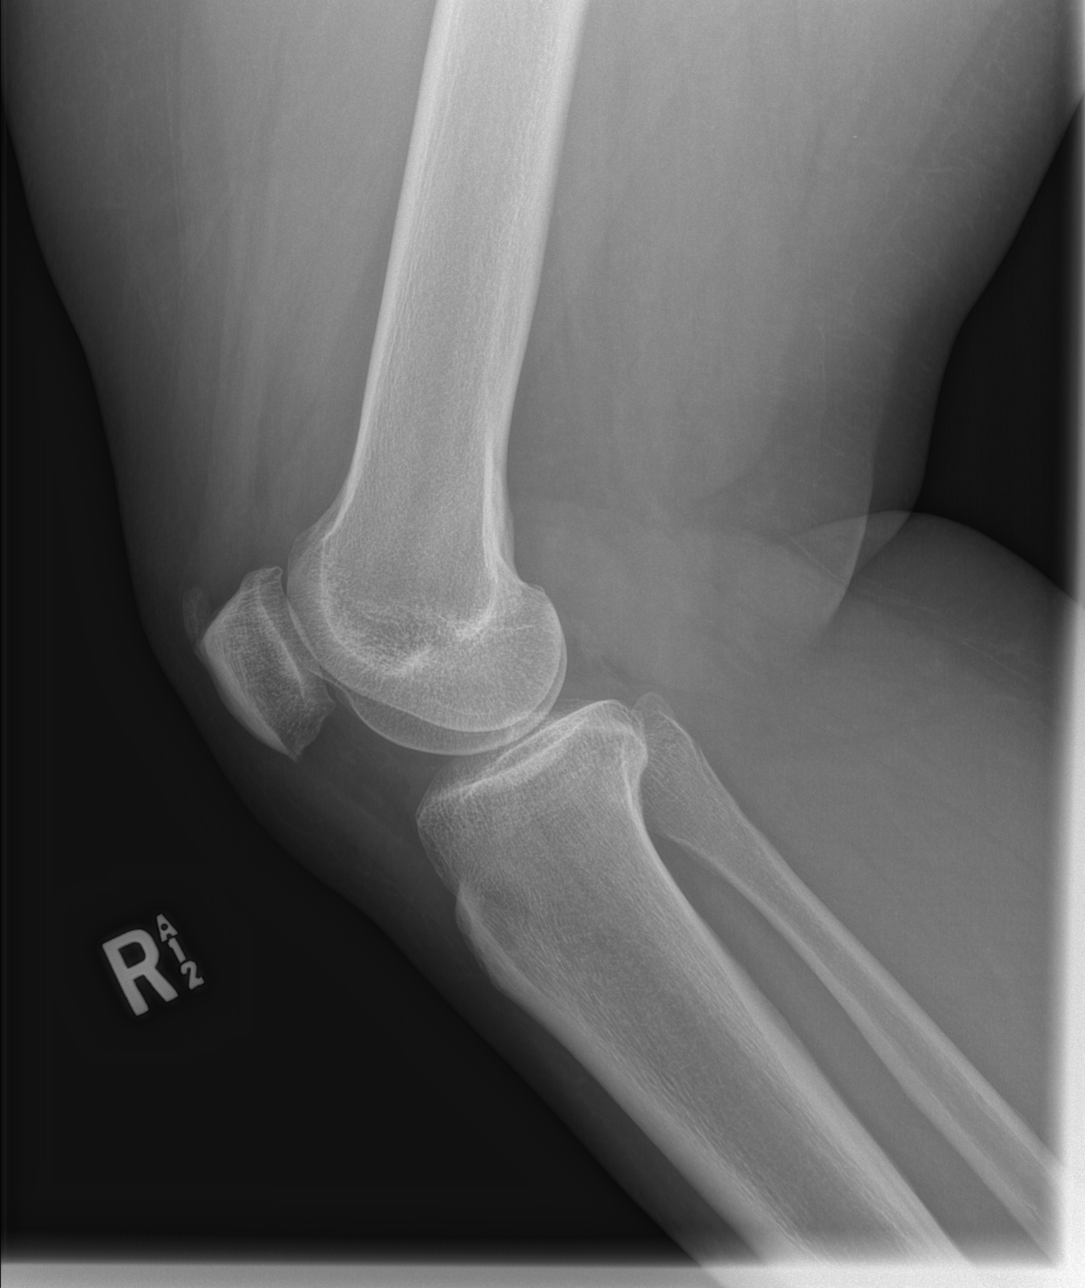

[4 of 4 positions shown; findings below may reference images not displayed]

FINDINGS: No evidence of fracture, dislocation, or joint effusion. No evidence
of arthropathy or other focal bone abnormality. Soft tissues are
unremarkable.
IMPRESSION: No significant abnormality seen in the right knee.

## 2019-11-26 ENCOUNTER — Emergency Department (HOSPITAL_COMMUNITY): Payer: Medicaid Other

## 2019-11-26 ENCOUNTER — Other Ambulatory Visit: Payer: Self-pay

## 2019-11-26 ENCOUNTER — Encounter (HOSPITAL_COMMUNITY): Payer: Self-pay | Admitting: Emergency Medicine

## 2019-11-26 ENCOUNTER — Emergency Department (HOSPITAL_COMMUNITY)
Admission: EM | Admit: 2019-11-26 | Discharge: 2019-11-26 | Disposition: A | Discharge status: Home / Self Care | Payer: Medicaid Other | Attending: Emergency Medicine | Admitting: Emergency Medicine

## 2019-11-26 DIAGNOSIS — J029 Acute pharyngitis, unspecified: Secondary | ICD-10-CM | POA: Insufficient documentation

## 2019-11-26 DIAGNOSIS — Z20822 Contact with and (suspected) exposure to covid-19: Secondary | ICD-10-CM | POA: Diagnosis not present

## 2019-11-26 DIAGNOSIS — Z79899 Other long term (current) drug therapy: Secondary | ICD-10-CM | POA: Insufficient documentation

## 2019-11-26 DIAGNOSIS — Z8616 Personal history of COVID-19: Secondary | ICD-10-CM | POA: Diagnosis not present

## 2019-11-26 DIAGNOSIS — B20 Human immunodeficiency virus [HIV] disease: Secondary | ICD-10-CM | POA: Insufficient documentation

## 2019-11-26 DIAGNOSIS — R059 Cough, unspecified: Secondary | ICD-10-CM

## 2019-11-26 DIAGNOSIS — R05 Cough: Secondary | ICD-10-CM | POA: Diagnosis not present

## 2019-11-26 DIAGNOSIS — Z7984 Long term (current) use of oral hypoglycemic drugs: Secondary | ICD-10-CM | POA: Diagnosis not present

## 2019-11-26 DIAGNOSIS — E119 Type 2 diabetes mellitus without complications: Secondary | ICD-10-CM | POA: Diagnosis not present

## 2019-11-26 LAB — SARS CORONAVIRUS 2 (TAT 6-24 HRS): SARS Coronavirus 2: NEGATIVE

## 2019-11-26 LAB — GROUP A STREP BY PCR: Group A Strep by PCR: NOT DETECTED

## 2019-11-26 MED ORDER — CETIRIZINE HCL 10 MG PO TABS: 10.0000 mg | ORAL_TABLET | Freq: Every day | ORAL | 0 refills | Status: DC

## 2019-11-26 MED ORDER — FLUTICASONE PROPIONATE 50 MCG/ACT NA SUSP: 1.0000 | Freq: Every day | NASAL | 0 refills | Status: DC

## 2019-11-26 MED ORDER — BENZONATATE 100 MG PO CAPS: 100.0000 mg | ORAL_CAPSULE | Freq: Three times a day (TID) | ORAL | 0 refills | Status: DC

## 2019-11-26 NOTE — ED Triage Notes (Signed)
Onset one day ago developed dry cough and sore throat continued today airway intact.

## 2019-11-26 NOTE — Discharge Instructions (Signed)
Take the medications as prescribed.  If you are having any chest pain, shortness of breath, unilateral leg swelling, unable to tolerate any oral intake please seek reevaluation

## 2019-11-26 NOTE — ED Notes (Signed)
ED Provider at bedside. 

## 2019-11-26 NOTE — ED Provider Notes (Signed)
[facility] EMERGENCY DEPARTMENT Provider Note   CSN: [id number] Arrival date & time: 11/26/19  1059     History Chief Complaint  Patient presents with  . Sore Throat  . Cough    Beverly Johnson is a 44 y.o. female with past medical history significant for HIV, last CD4 count 750 with viral load less than 10, diabetes who presents for evaluation of sore throat and cough.  Patient states she has had sore throat x2 days.  Has been able to tolerate p.o. intake however has pain with swallowing.  Describes as a scratching sharp sensation.  Patient also with nonproductive cough.  No recent Covid exposures and accident Covid in October.  She denies fever, chills, nausea, vomiting, headache, lightheadedness, dizziness, chest pain, shortness of breath, hemoptysis, domino pain, diarrhea, dysuria, lateral weakness.  No recent Covid exposures that she knows of.  Has not take anything for symptoms.  Denies additional aggravating or alleviating factors.  History obtained from patient and past medical records.  No interpreter is used.  HPI     Past Medical History:  Diagnosis Date  . Diabetes mellitus without complication ([place])   . Gallstones 03/10/2011  . HIV infection ([place])   . Immune deficiency disorder [facility])     Patient Active Problem List   Diagnosis Date Noted  . Hyperglycemia 06/16/2019  . History of 2019 novel coronavirus disease (COVID-19) 06/16/2019  . Dehydration 06/16/2019  . AKI (acute kidney injury) ([place]) 06/16/2019  . Candidal esophagitis ([place]) 06/16/2019  . Abnormal ECG 06/16/2019  . Acute respiratory disease due to COVID-19 virus 06/02/2019  . Medication monitoring encounter 11/12/2017  . Breast nodule 09/02/2017  . Screening examination for venereal disease 03/30/2017  . Encounter for long-term (current) use of high-risk medication 03/30/2017  . Microalbuminuria due to type 2 diabetes mellitus ([place]) 03/30/2017  . Knee pain, chronic 03/30/2017  .  Vitamin D deficiency 03/21/2017  . Bilateral leg cramps 01/22/2017  . Healthcare maintenance 03/02/2014  . Morbid obesity with BMI of 45.0-49.9, adult ([place]) 04/27/2012  . Uncontrolled type 2 diabetes mellitus ([place]) 12/08/2010  . Human immunodeficiency virus (HIV) disease ([place]) 09/29/2006    Past Surgical History:  Procedure Laterality Date  . CHOLECYSTECTOMY  06/29/2011   Procedure: LAPAROSCOPIC CHOLECYSTECTOMY WITH INTRAOPERATIVE CHOLANGIOGRAM;  Surgeon: Merrie Roof, MD;  Location: [facility];  Service: General;  Laterality: N/A;  . NO PAST SURGERIES       OB History   No obstetric history on file.     Family History  Problem Relation Age of Onset  . Healthy Mother   . Healthy Father     Social History   Tobacco Use  . Smoking status: Never Smoker  . Smokeless tobacco: Never Used  Substance Use Topics  . Alcohol use: No  . Drug use: No    Home Medications Prior to Admission medications   Medication Sig Start Date End Date Taking? Authorizing Provider  benzonatate (TESSALON) 100 MG capsule Take 1 capsule (100 mg total) by mouth every 8 (eight) hours. 11/26/19   Ramiyah Mcclenahan A, PA-C  cetirizine (ZYRTEC ALLERGY) 10 MG tablet Take 1 tablet (10 mg total) by mouth daily. 11/26/19   Maddison Kilner A, PA-C  fluticasone (FLONASE) 50 MCG/ACT nasal spray Place 1 spray into both nostrils daily. 11/26/19   Ascher Schroepfer A, PA-C  GENVOYA 150-150-200-10 MG TABS tablet TAKE 1 TABLET BY MOUTH DAILY WITH BREAKFAST. Patient taking differently: Take 1 tablet by mouth daily with  breakfast.  09/02/18   Comer, Okey Regal, MD  glucose blood test strip Check your sugar in the morning before you eat breakfast, and one hour after a meal. 05/26/19   Melynda Ripple, MD  glucose monitoring kit (FREESTYLE) monitoring kit 1 each by Does not apply route daily. Check glucose once in the morning before breakfast and 1 hour after a meal 05/26/19   Melynda Ripple, MD  ibuprofen (ADVIL) 600 MG tablet  Take 1 tablet (600 mg total) by mouth every 6 (six) hours as needed. Patient taking differently: Take 600 mg by mouth every 6 (six) hours as needed for mild pain.  11/04/19   Faustino Congress, NP  metFORMIN (GLUCOPHAGE) 500 MG tablet Take 2 tablets (1,000 mg total) by mouth 2 (two) times daily with a meal. 03/22/17 09/08/26  Ledell Noss, MD  methocarbamol (ROBAXIN) 500 MG tablet Take 1 tablet (500 mg total) by mouth 2 (two) times daily. 11/04/19   Faustino Congress, NP  sitaGLIPtin (JANUVIA) 100 MG tablet Take 1 tablet (100 mg total) by mouth daily. 08/05/17   Neva Seat, MD    Allergies    Patient has no known allergies.  Review of Systems   Review of Systems  Constitutional: Negative.   HENT: Positive for sore throat. Negative for congestion, ear discharge, ear pain, facial swelling, hearing loss, mouth sores, nosebleeds, postnasal drip, rhinorrhea, sinus pressure, sinus pain, sneezing, trouble swallowing and voice change.   Respiratory: Positive for cough. Negative for apnea, choking, chest tightness, shortness of breath, wheezing and stridor.   Cardiovascular: Negative.   Gastrointestinal: Negative.   Genitourinary: Negative.   Musculoskeletal: Negative.   Neurological: Negative.   All other systems reviewed and are negative.   Physical Exam Updated Vital Signs BP 118/77 (BP Location: Right Arm)   Pulse 91   Temp 98.4 F (36.9 C) (Oral)   Resp 18   Ht '5\' 7"'$  (1.702 m)   Wt 113.9 kg   LMP 11/13/2019   SpO2 100%   BMI 39.31 kg/m   Physical Exam Vitals and nursing note reviewed.  Constitutional:      General: She is not in acute distress.    Appearance: She is not ill-appearing, toxic-appearing or diaphoretic.  HENT:     Head: Normocephalic and atraumatic.     Jaw: There is normal jaw occlusion.     Right Ear: Tympanic membrane, ear canal and external ear normal. There is no impacted cerumen. No hemotympanum. Tympanic membrane is not injected, scarred, perforated,  erythematous, retracted or bulging.     Left Ear: Tympanic membrane, ear canal and external ear normal. There is no impacted cerumen. No hemotympanum. Tympanic membrane is not injected, scarred, perforated, erythematous, retracted or bulging.     Ears:     Comments: No Mastoid tenderness.    Nose:     Comments: Clear rhinorrhea and congestion to bilateral nares.  No sinus tenderness.    Mouth/Throat:     Lips: Pink.     Mouth: Mucous membranes are moist.     Pharynx: Oropharynx is clear. Uvula midline. Posterior oropharyngeal erythema present.     Tonsils: No tonsillar exudate or tonsillar abscesses. 0 on the right. 0 on the left.     Comments: Posterior oropharynx erythematous however no exudate.  Mucous membranes moist.  Tonsils without erythema or exudate.  Uvula midline without deviation.  No evidence of PTA or RPA.  No drooling, dysphasia or trismus.  Phonation normal. Neck:     Trachea: Trachea  and phonation normal.     Meningeal: Brudzinski's sign and Kernig's sign absent.     Comments: No Neck stiffness or neck rigidity.  No meningismus.  No cervical lymphadenopathy. Cardiovascular:     Comments: No murmurs rubs or gallops. Pulmonary:     Comments: Clear to auscultation bilaterally without wheeze, rhonchi or rales.  No accessory muscle usage.  Able speak in full sentences. Abdominal:     Comments: Soft, nontender without rebound or guarding.  No CVA tenderness.  Musculoskeletal:     Comments: Moves all 4 extremities without difficulty.  Lower extremities without edema, erythema or warmth.  Skin:    Comments: Brisk capillary refill.  No rashes or lesions.  Neurological:     Mental Status: She is alert.     Comments: Ambulatory in department without difficulty.  Cranial nerves II through XII grossly intact.  No facial droop.  No aphasia.     ED Results / Procedures / Treatments   Labs (all labs ordered are listed, but only abnormal results are displayed) Labs Reviewed  GROUP  A STREP BY PCR  SARS CORONAVIRUS 2 (TAT 6-24 HRS)    EKG None  Radiology DG Chest Portable 1 View  Result Date: 11/26/2019 CLINICAL DATA:  Cough with sore throat for 1 day. EXAM: PORTABLE CHEST 1 VIEW COMPARISON:  Radiographs 06/16/2019 and 06/02/2019. FINDINGS: 1153 hours. The heart size and mediastinal contours are normal. The lungs are clear. There is no pleural effusion or pneumothorax. No acute osseous findings are identified. IMPRESSION: No active cardiopulmonary process. Electronically Signed   By: Richardean Sale M.D.   On: 11/26/2019 12:45    Procedures Procedures (including critical care time)  Medications Ordered in ED Medications - No data to display  ED Course  I have reviewed the triage vital signs and the nursing notes.  Pertinent labs & imaging results that were available during my care of the patient were reviewed by me and considered in my medical decision making (see chart for details).  44 year old female appears otherwise well presents for evaluation of sore throat and nonproductive cough.  She is afebrile, nonseptic, not ill-appearing. No recent Covid exposures and had Covid in October.  Posterior oropharynx erythematous however no tonsillar edema or exudate.  Uvula midline.  No evidence of PTA or RPA.  No drooling, dysphagia or trismus.  Her heart and lungs are clear.  No unilateral leg swelling, redness or warmth.  No evidence of DVT on exam.  No chest pain, shortness of breath or hemoptysis.  She does have nonproductive cough.  Patient is compliant with her HIV medications, her last CD4 count was 750 with viral load less than 10.  Plan on strep, chest x-ray and reassess.  Strep test negative Chest x-ray without any infiltrates, cardiomegaly, pulmonary edema, pneumothorax  Patient tolerating p.o. intake without difficulty.  Question viral illness?  Her Covid test is pending.  Discussed symptomatic management at home.  She continues to denies chest pain, shortness  of breath. Low suspicion for PE as cause of her cough.  Discussed symptomatic management.  Discussed follow-up with primary care provider and she can return for any worsening symptoms.   The patient has been appropriately medically screened and/or stabilized in the ED. I have low suspicion for any other emergent medical condition which would require further screening, evaluation or treatment in the ED or require inpatient management.  Patient is hemodynamically stable and in no acute distress.  Patient able to ambulate in department prior to  ED.  Evaluation does not show acute pathology that would require ongoing or additional emergent interventions while in the emergency department or further inpatient treatment.  I have discussed the diagnosis with the patient and answered all questions.  Pain is been managed while in the emergency department and patient has no further complaints prior to discharge.  Patient is comfortable with plan discussed in room and is stable for discharge at this time.  I have discussed strict return precautions for returning to the emergency department.  Patient was encouraged to follow-up with PCP/specialist refer to at discharge.      MDM Rules/Calculators/A&P                      NEL STONEKING was evaluated in Emergency Department on 11/26/2019 for the symptoms described in the history of present illness. She was evaluated in the context of the global COVID-19 pandemic, which necessitated consideration that the patient might be at risk for infection with the SARS-CoV-2 virus that causes COVID-19. Institutional protocols and algorithms that pertain to the evaluation of patients at risk for COVID-19 are in a state of rapid change based on information released by regulatory bodies including the CDC and federal and state organizations. These policies and algorithms were followed during the patient's care in the ED.  Final Clinical Impression(s) / ED Diagnoses Final  diagnoses:  Cough  Sore throat  COVID-19 virus test result unknown    Rx / DC Orders ED Discharge Orders         Ordered    cetirizine (ZYRTEC ALLERGY) 10 MG tablet  Daily     11/26/19 1432    fluticasone (FLONASE) 50 MCG/ACT nasal spray  Daily     11/26/19 1432    benzonatate (TESSALON) 100 MG capsule  Every 8 hours     11/26/19 1432           Ammara Raj A, PA-C 11/26/19 1433    Cur[place], Adam, DO 11/26/19 1540

## 2019-11-26 NOTE — ED Notes (Signed)
Pt verbalized understanding of discharge instructions and denies any further questions at this time.   Work excuse provided

## 2020-01-08 ENCOUNTER — Other Ambulatory Visit: Payer: Self-pay

## 2020-01-08 ENCOUNTER — Emergency Department (HOSPITAL_COMMUNITY)
Admission: EM | Admit: 2020-01-08 | Discharge: 2020-01-08 | Disposition: A | Discharge status: Home / Self Care | Payer: Medicaid Other | Attending: Emergency Medicine | Admitting: Emergency Medicine

## 2020-01-08 ENCOUNTER — Encounter (HOSPITAL_COMMUNITY): Payer: Self-pay | Admitting: Emergency Medicine

## 2020-01-08 DIAGNOSIS — Z3202 Encounter for pregnancy test, result negative: Secondary | ICD-10-CM | POA: Insufficient documentation

## 2020-01-08 DIAGNOSIS — B20 Human immunodeficiency virus [HIV] disease: Secondary | ICD-10-CM | POA: Insufficient documentation

## 2020-01-08 DIAGNOSIS — Z7984 Long term (current) use of oral hypoglycemic drugs: Secondary | ICD-10-CM | POA: Insufficient documentation

## 2020-01-08 DIAGNOSIS — N912 Amenorrhea, unspecified: Secondary | ICD-10-CM | POA: Diagnosis not present

## 2020-01-08 DIAGNOSIS — Z79899 Other long term (current) drug therapy: Secondary | ICD-10-CM | POA: Insufficient documentation

## 2020-01-08 DIAGNOSIS — N938 Other specified abnormal uterine and vaginal bleeding: Secondary | ICD-10-CM | POA: Diagnosis not present

## 2020-01-08 DIAGNOSIS — E119 Type 2 diabetes mellitus without complications: Secondary | ICD-10-CM | POA: Insufficient documentation

## 2020-01-08 LAB — CBC WITH DIFFERENTIAL/PLATELET
Abs Immature Granulocytes: 0.02 10*3/uL (ref 0.00–0.07)
Basophils Absolute: 0 10*3/uL (ref 0.0–0.1)
Basophils Relative: 0 %
Eosinophils Absolute: 0 10*3/uL (ref 0.0–0.5)
Eosinophils Relative: 1 %
HCT: 33.2 % — ABNORMAL LOW (ref 36.0–46.0)
Hemoglobin: 9.7 g/dL — ABNORMAL LOW (ref 12.0–15.0)
Immature Granulocytes: 0 %
Lymphocytes Relative: 41 %
Lymphs Abs: 2 10*3/uL (ref 0.7–4.0)
MCH: 21.7 pg — ABNORMAL LOW (ref 26.0–34.0)
MCHC: 29.2 g/dL — ABNORMAL LOW (ref 30.0–36.0)
MCV: 74.1 fL — ABNORMAL LOW (ref 80.0–100.0)
Monocytes Absolute: 0.4 10*3/uL (ref 0.1–1.0)
Monocytes Relative: 7 %
Neutro Abs: 2.5 10*3/uL (ref 1.7–7.7)
Neutrophils Relative %: 51 %
Platelets: 194 10*3/uL (ref 150–400)
RBC: 4.48 MIL/uL (ref 3.87–5.11)
RDW: 14.1 % (ref 11.5–15.5)
WBC: 4.9 10*3/uL (ref 4.0–10.5)
nRBC: 0 % (ref 0.0–0.2)

## 2020-01-08 LAB — COMPREHENSIVE METABOLIC PANEL
ALT: 14 U/L (ref 0–44)
AST: 19 U/L (ref 15–41)
Albumin: 3.3 g/dL — ABNORMAL LOW (ref 3.5–5.0)
Alkaline Phosphatase: 124 U/L (ref 38–126)
Anion gap: 8 (ref 5–15)
BUN: 6 mg/dL (ref 6–20)
CO2: 24 mmol/L (ref 22–32)
Calcium: 9.2 mg/dL (ref 8.9–10.3)
Chloride: 104 mmol/L (ref 98–111)
Creatinine, Ser: 0.57 mg/dL (ref 0.44–1.00)
GFR calc Af Amer: >60 mL/min (ref >60)
GFR calc non Af Amer: >60 mL/min (ref >60)
Glucose, Bld: 320 mg/dL — ABNORMAL HIGH (ref 70–99)
Potassium: 4 mmol/L (ref 3.5–5.1)
Sodium: 136 mmol/L (ref 135–145)
Total Bilirubin: 0.4 mg/dL (ref 0.3–1.2)
Total Protein: 7.2 g/dL (ref 6.5–8.1)

## 2020-01-08 LAB — I-STAT BETA HCG BLOOD, ED (MC, WL, AP ONLY): I-stat hCG, quantitative: <5 m[IU]/mL (ref <5)

## 2020-01-08 MED ORDER — SODIUM CHLORIDE 0.9% FLUSH: 3.0000 mL | Freq: Once | INTRAVENOUS | Status: DC

## 2020-01-08 NOTE — ED Provider Notes (Signed)
[facility] EMERGENCY DEPARTMENT Provider Note   CSN: [id number] Arrival date & time: 01/08/20  1946    History Chief Complaint  Patient presents with  . Menstrual Problem    Beverly Johnson is a 44 y.o. female with past medical history significant for HIV, diabetes who presents for evaluation of missed menstrual cycle.  Patient states on Thursday she is supposed to start her menstrual cycle.  Patient states she only had "spotting" over Thursday and Friday.  Has prior history of irregular menstrual cycles.  She is compliant with her HIV and diabetes medicines.  States she is sexually active and intermittently uses protection.  She denies fever, chills, nausea, vomiting, chest pain, shortness of breath, abdominal pain, pelvic pain, vaginal discharge, concerns for STDs, rashes, lesions.  She has been tolerating p.o. intake at home without difficulty.  Patient states she is here to "just make sure I am not pregnant."  She has not taken a home pregnancy test.  Denies additional aggravating or alleviating factors.  History obtained from patient and past medical records.  No interpreter is used.  HPI     Past Medical History:  Diagnosis Date  . Diabetes mellitus without complication ([place])   . Gallstones 03/10/2011  . HIV infection ([place])   . Immune deficiency disorder [facility])     Patient Active Problem List   Diagnosis Date Noted  . Hyperglycemia 06/16/2019  . History of 2019 novel coronavirus disease (COVID-19) 06/16/2019  . Dehydration 06/16/2019  . AKI (acute kidney injury) ([place]) 06/16/2019  . Candidal esophagitis ([place]) 06/16/2019  . Abnormal ECG 06/16/2019  . Acute respiratory disease due to COVID-19 virus 06/02/2019  . Medication monitoring encounter 11/12/2017  . Breast nodule 09/02/2017  . Screening examination for venereal disease 03/30/2017  . Encounter for long-term (current) use of high-risk medication 03/30/2017  . Microalbuminuria due to type 2 diabetes  mellitus ([place]) 03/30/2017  . Knee pain, chronic 03/30/2017  . Vitamin D deficiency 03/21/2017  . Bilateral leg cramps 01/22/2017  . Healthcare maintenance 03/02/2014  . Morbid obesity with BMI of 45.0-49.9, adult ([place]) 04/27/2012  . Uncontrolled type 2 diabetes mellitus ([place]) 12/08/2010  . Human immunodeficiency virus (HIV) disease ([place]) 09/29/2006    Past Surgical History:  Procedure Laterality Date  . CHOLECYSTECTOMY  06/29/2011   Procedure: LAPAROSCOPIC CHOLECYSTECTOMY WITH INTRAOPERATIVE CHOLANGIOGRAM;  Surgeon: Merrie Roof, MD;  Location: [facility];  Service: General;  Laterality: N/A;  . NO PAST SURGERIES       OB History   No obstetric history on file.     Family History  Problem Relation Age of Onset  . Healthy Mother   . Healthy Father     Social History   Tobacco Use  . Smoking status: Never Smoker  . Smokeless tobacco: Never Used  Substance Use Topics  . Alcohol use: No  . Drug use: No    Home Medications Prior to Admission medications   Medication Sig Start Date End Date Taking? Authorizing Provider  benzonatate (TESSALON) 100 MG capsule Take 1 capsule (100 mg total) by mouth every 8 (eight) hours. 11/26/19   Meet Weathington A, PA-C  cetirizine (ZYRTEC ALLERGY) 10 MG tablet Take 1 tablet (10 mg total) by mouth daily. 11/26/19   Royann Wildasin A, PA-C  fluticasone (FLONASE) 50 MCG/ACT nasal spray Place 1 spray into both nostrils daily. 11/26/19   Aki Abalos A, PA-C  GENVOYA 150-150-200-10 MG TABS tablet TAKE 1 TABLET BY MOUTH DAILY WITH BREAKFAST.  Patient taking differently: Take 1 tablet by mouth daily with breakfast.  09/02/18   Comer, Okey Regal, MD  glucose blood test strip Check your sugar in the morning before you eat breakfast, and one hour after a meal. 05/26/19   Melynda Ripple, MD  glucose monitoring kit (FREESTYLE) monitoring kit 1 each by Does not apply route daily. Check glucose once in the morning before breakfast and 1 hour after a meal  05/26/19   Melynda Ripple, MD  ibuprofen (ADVIL) 600 MG tablet Take 1 tablet (600 mg total) by mouth every 6 (six) hours as needed. Patient taking differently: Take 600 mg by mouth every 6 (six) hours as needed for mild pain.  11/04/19   Faustino Congress, NP  metFORMIN (GLUCOPHAGE) 500 MG tablet Take 2 tablets (1,000 mg total) by mouth 2 (two) times daily with a meal. 03/22/17 09/08/26  Ledell Noss, MD  methocarbamol (ROBAXIN) 500 MG tablet Take 1 tablet (500 mg total) by mouth 2 (two) times daily. 11/04/19   Faustino Congress, NP  sitaGLIPtin (JANUVIA) 100 MG tablet Take 1 tablet (100 mg total) by mouth daily. 08/05/17   Neva Seat, MD    Allergies    Patient has no known allergies.  Review of Systems   Review of Systems  Constitutional: Negative.   HENT: Negative.   Respiratory: Negative.   Cardiovascular: Negative.   Gastrointestinal: Negative.   Genitourinary: Positive for menstrual problem. Negative for decreased urine volume, difficulty urinating, dyspareunia, flank pain, frequency, pelvic pain, urgency, vaginal bleeding, vaginal discharge and vaginal pain.  Musculoskeletal: Negative.   Skin: Negative.   Neurological: Negative.   All other systems reviewed and are negative.   Physical Exam Updated Vital Signs BP 126/87 (BP Location: Left Arm)   Pulse 95   Temp 98.7 F (37.1 C) (Oral)   Resp 17   Ht '5\' 7"'$  (1.702 m)   Wt 117.9 kg   SpO2 99%   BMI 40.72 kg/m   Physical Exam Vitals and nursing note reviewed.  Constitutional:      General: She is not in acute distress.    Appearance: She is well-developed. She is not ill-appearing, toxic-appearing or diaphoretic.  HENT:     Head: Normocephalic and atraumatic.     Nose: Nose normal.     Mouth/Throat:     Mouth: Mucous membranes are moist.  Eyes:     Pupils: Pupils are equal, round, and reactive to light.  Cardiovascular:     Rate and Rhythm: Normal rate.     Pulses: Normal pulses.     Heart sounds: Normal  heart sounds.  Pulmonary:     Effort: Pulmonary effort is normal. No respiratory distress.     Breath sounds: Normal breath sounds.  Abdominal:     General: Bowel sounds are normal. There is no distension.     Comments: Soft, nontender without rebound or guarding.  Genitourinary:    Comments: Declined GU exam Musculoskeletal:        General: Normal range of motion.     Cervical back: Normal range of motion.  Skin:    General: Skin is warm and dry.     Capillary Refill: Capillary refill takes less than 2 seconds.  Neurological:     General: No focal deficit present.     Mental Status: She is alert and oriented to person, place, and time.    ED Results / Procedures / Treatments   Labs (all labs ordered are listed, but only abnormal results  are displayed) Labs Reviewed  COMPREHENSIVE METABOLIC PANEL - Abnormal; Notable for the following components:      Result Value   Glucose, Bld 320 (*)    Albumin 3.3 (*)    All other components within normal limits  CBC WITH DIFFERENTIAL/PLATELET - Abnormal; Notable for the following components:   Hemoglobin 9.7 (*)    HCT 33.2 (*)    MCV 74.1 (*)    MCH 21.7 (*)    MCHC 29.2 (*)    All other components within normal limits  I-STAT BETA HCG BLOOD, ED ([facility], [facility], [facility] ONLY)    EKG None  Radiology No results found.  Procedures Procedures (including critical care time)  Medications Ordered in ED Medications  sodium chloride flush (NS) 0.9 % injection 3 mL (3 mLs Intravenous Not Given 01/08/20 2154)    ED Course  I have reviewed the triage vital signs and the nursing notes.  Pertinent labs & imaging results that were available during my care of the patient were reviewed by me and considered in my medical decision making (see chart for details).  44 year old female presents for evaluation of irregular menstrual cycles.  She is afebrile, nonseptic, not ill-appearing.  Compliant with her HIV medications.  Followed by Dr. Linus Salmons with last  CD4 level of 724, viral load less than 20. Heart and lungs clear.  Abdomen soft, nontender.  Apparently it was supposed to get her menstrual cycle on Thursday.  Patient states she only had spotting Thursday and Friday.  No further vaginal bleeding.  No associated pain.  No prior history of dysfunctional uterine bleeding.  Patient concerned with pregnancy.  She is sexually active and intermittently uses protection.  Does not want STD testing.  Labs obtained from triage. Pregnancy test negative, CBC without leukocytosis, hemoglobin 9.7 similar to previous Metabolic panel with hyperglycemia to 320.  No additional electrolyte, renal abnormality.  No elevated anion gap.  Patient has not taken her diabetes medicines today.  She is not on insulin.  I offer them here in the ED however she declined.  States she will take them at home.  Low suspicion for DKA  Patient with likely dysfunctional uterine bleeding.  No pregnancy on i-STAT pregnancy test here today.  Her abdomen is soft, nontender.  We will have her follow-up outpatient.  She will return for any worsening symptoms.  The patient has been appropriately medically screened and/or stabilized in the ED. I have low suspicion for any other emergent medical condition which would require further screening, evaluation or treatment in the ED or require inpatient management.  Patient is hemodynamically stable and in no acute distress.  Patient able to ambulate in department prior to ED.  Evaluation does not show acute pathology that would require ongoing or additional emergent interventions while in the emergency department or further inpatient treatment.  I have discussed the diagnosis with the patient and answered all questions.  Pain is been managed while in the emergency department and patient has no further complaints prior to discharge.  Patient is comfortable with plan discussed in room and is stable for discharge at this time.  I have discussed strict return  precautions for returning to the emergency department.  Patient was encouraged to follow-up with PCP/specialist refer to at discharge.     MDM Rules/Calculators/A&P                       Final Clinical Impression(s) / ED Diagnoses Final diagnoses:  Dysfunctional  uterine bleeding    Rx / DC Orders ED Discharge Orders    None       Kristyn Obyrne A, PA-C 01/08/20 2236    Drenda Freeze, MD 01/08/20 2259

## 2020-01-08 NOTE — Discharge Instructions (Signed)
Your pregnancy test is negative.  I suggest following up with your primary care provider for further evaluation.

## 2020-01-08 NOTE — ED Triage Notes (Signed)
Pt reports she was suppose to start her period, she only spotted.  Is not on birth control.  No pain.

## 2020-02-06 ENCOUNTER — Emergency Department (HOSPITAL_COMMUNITY)
Admission: EM | Admit: 2020-02-06 | Discharge: 2020-02-06 | Disposition: A | Discharge status: Home / Self Care | Payer: Medicaid Other | Attending: Emergency Medicine | Admitting: Emergency Medicine

## 2020-02-06 ENCOUNTER — Encounter (HOSPITAL_COMMUNITY): Payer: Self-pay | Admitting: *Deleted

## 2020-02-06 ENCOUNTER — Other Ambulatory Visit: Payer: Self-pay

## 2020-02-06 DIAGNOSIS — N898 Other specified noninflammatory disorders of vagina: Secondary | ICD-10-CM | POA: Diagnosis not present

## 2020-02-06 DIAGNOSIS — Z7984 Long term (current) use of oral hypoglycemic drugs: Secondary | ICD-10-CM | POA: Insufficient documentation

## 2020-02-06 DIAGNOSIS — R300 Dysuria: Secondary | ICD-10-CM

## 2020-02-06 DIAGNOSIS — E119 Type 2 diabetes mellitus without complications: Secondary | ICD-10-CM | POA: Insufficient documentation

## 2020-02-06 DIAGNOSIS — Z79899 Other long term (current) drug therapy: Secondary | ICD-10-CM | POA: Diagnosis not present

## 2020-02-06 DIAGNOSIS — E860 Dehydration: Secondary | ICD-10-CM | POA: Diagnosis not present

## 2020-02-06 DIAGNOSIS — N3001 Acute cystitis with hematuria: Secondary | ICD-10-CM | POA: Diagnosis not present

## 2020-02-06 DIAGNOSIS — B20 Human immunodeficiency virus [HIV] disease: Secondary | ICD-10-CM | POA: Insufficient documentation

## 2020-02-06 LAB — URINALYSIS, ROUTINE W REFLEX MICROSCOPIC
Bilirubin Urine: NEGATIVE
Glucose, UA: >=500 mg/dL — AB
Ketones, ur: NEGATIVE mg/dL
Nitrite: NEGATIVE
Protein, ur: 100 mg/dL — AB
RBC / HPF: >50 RBC/hpf — ABNORMAL HIGH (ref 0–5)
Specific Gravity, Urine: 1.03 (ref 1.005–1.030)
WBC, UA: >50 WBC/hpf — ABNORMAL HIGH (ref 0–5)
pH: 5 (ref 5.0–8.0)

## 2020-02-06 LAB — POC URINE PREG, ED: Preg Test, Ur: NEGATIVE

## 2020-02-06 MED ORDER — CEPHALEXIN 500 MG PO CAPS: 500.0000 mg | ORAL_CAPSULE | Freq: Three times a day (TID) | ORAL | 0 refills | Status: AC

## 2020-02-06 MED ORDER — FLUCONAZOLE 200 MG PO TABS: 200.0000 mg | ORAL_TABLET | Freq: Once | ORAL | 0 refills | Status: AC

## 2020-02-06 MED ORDER — FLUCONAZOLE 200 MG PO TABS
200.0000 mg | ORAL_TABLET | Freq: Once | ORAL | Status: AC
  Administered 2020-02-06: 200 mg via ORAL
  Filled 2020-02-06: qty 1

## 2020-02-06 MED ORDER — CEPHALEXIN 250 MG PO CAPS
500.0000 mg | ORAL_CAPSULE | Freq: Once | ORAL | Status: AC
  Administered 2020-02-06: 500 mg via ORAL
  Filled 2020-02-06: qty 2

## 2020-02-06 NOTE — ED Triage Notes (Signed)
Pt is here with complaints of burning with urination and when she wiped there was some blood.  Pt is concerned she has a bladder or yeast infection.  Pt is a diabetic.  No pain

## 2020-02-06 NOTE — ED Provider Notes (Signed)
[facility] EMERGENCY DEPARTMENT Provider Note   CSN: [id number] Arrival date & time: 02/06/20  7412     History Chief Complaint  Patient presents with  . Dysuria    Beverly Johnson is a 44 y.o. female.  HPI Patient is a 44 year old female with history of HIV and diabetes mellitus.  Patient notes for the last 24 hours she has begun experiencing some "tingling" when urinating and this morning noticed a small amount of blood when wiping after urinating.  Patient additionally complains of some mild vaginal irritation.  Patient states she has been in a monogamous relationship with 1 female partner for the last 10 years.  No vaginal discharge.  She is followed by ID for her history of HIV and is currently taking ART, which she confirms.  She confirms taking her listed medications including 2 g of Metformin daily.  Patient states her last menstrual period was around May 30.  It was normal.  She reports history of similar symptoms in March of this year and was treated with Diflucan as well as Keflex.  She confirms relief with both.  She denies any fevers, chills, abdominal pain, nausea, vomiting, diarrhea, chest pain, shortness of breath, back pain, flank pain.      Past Medical History:  Diagnosis Date  . Diabetes mellitus without complication ([place])   . Gallstones 03/10/2011  . HIV infection ([place])   . Immune deficiency disorder [facility])     Patient Active Problem List   Diagnosis Date Noted  . Hyperglycemia 06/16/2019  . History of 2019 novel coronavirus disease (COVID-19) 06/16/2019  . Dehydration 06/16/2019  . AKI (acute kidney injury) ([place]) 06/16/2019  . Candidal esophagitis ([place]) 06/16/2019  . Abnormal ECG 06/16/2019  . Acute respiratory disease due to COVID-19 virus 06/02/2019  . Medication monitoring encounter 11/12/2017  . Breast nodule 09/02/2017  . Screening examination for venereal disease 03/30/2017  . Encounter for long-term (current) use of high-risk  medication 03/30/2017  . Microalbuminuria due to type 2 diabetes mellitus ([place]) 03/30/2017  . Knee pain, chronic 03/30/2017  . Vitamin D deficiency 03/21/2017  . Bilateral leg cramps 01/22/2017  . Healthcare maintenance 03/02/2014  . Morbid obesity with BMI of 45.0-49.9, adult ([place]) 04/27/2012  . Uncontrolled type 2 diabetes mellitus ([place]) 12/08/2010  . Human immunodeficiency virus (HIV) disease ([place]) 09/29/2006    Past Surgical History:  Procedure Laterality Date  . CHOLECYSTECTOMY  06/29/2011   Procedure: LAPAROSCOPIC CHOLECYSTECTOMY WITH INTRAOPERATIVE CHOLANGIOGRAM;  Surgeon: Merrie Roof, MD;  Location: [facility];  Service: General;  Laterality: N/A;  . NO PAST SURGERIES       OB History   No obstetric history on file.     Family History  Problem Relation Age of Onset  . Healthy Mother   . Healthy Father     Social History   Tobacco Use  . Smoking status: Never Smoker  . Smokeless tobacco: Never Used  Vaping Use  . Vaping Use: Never used  Substance Use Topics  . Alcohol use: No  . Drug use: No    Home Medications Prior to Admission medications   Medication Sig Start Date End Date Taking? Authorizing Provider  benzonatate (TESSALON) 100 MG capsule Take 1 capsule (100 mg total) by mouth every 8 (eight) hours. 11/26/19   Henderly, Britni A, PA-C  cetirizine (ZYRTEC ALLERGY) 10 MG tablet Take 1 tablet (10 mg total) by mouth daily. 11/26/19   Henderly, Britni A, PA-C  fluticasone (FLONASE) 50  MCG/ACT nasal spray Place 1 spray into both nostrils daily. 11/26/19   Henderly, Britni A, PA-C  GENVOYA 150-150-200-10 MG TABS tablet TAKE 1 TABLET BY MOUTH DAILY WITH BREAKFAST. Patient taking differently: Take 1 tablet by mouth daily with breakfast.  09/02/18   Comer, Okey Regal, MD  glucose blood test strip Check your sugar in the morning before you eat breakfast, and one hour after a meal. 05/26/19   Melynda Ripple, MD  glucose monitoring kit (FREESTYLE) monitoring kit 1 each by  Does not apply route daily. Check glucose once in the morning before breakfast and 1 hour after a meal 05/26/19   Melynda Ripple, MD  ibuprofen (ADVIL) 600 MG tablet Take 1 tablet (600 mg total) by mouth every 6 (six) hours as needed. Patient taking differently: Take 600 mg by mouth every 6 (six) hours as needed for mild pain.  11/04/19   Faustino Congress, NP  metFORMIN (GLUCOPHAGE) 500 MG tablet Take 2 tablets (1,000 mg total) by mouth 2 (two) times daily with a meal. 03/22/17 09/08/26  Ledell Noss, MD  methocarbamol (ROBAXIN) 500 MG tablet Take 1 tablet (500 mg total) by mouth 2 (two) times daily. 11/04/19   Faustino Congress, NP  sitaGLIPtin (JANUVIA) 100 MG tablet Take 1 tablet (100 mg total) by mouth daily. 08/05/17   Neva Seat, MD    Allergies    Patient has no known allergies.  Review of Systems   Review of Systems  All other systems reviewed and are negative. Ten systems reviewed and are negative for acute change, except as noted in the HPI.    Physical Exam Updated Vital Signs BP 128/88   Pulse 72   Temp 98.4 F (36.9 C) (Oral)   Resp 16   SpO2 100%   Physical Exam Vitals and nursing note reviewed.  Constitutional:      General: She is not in acute distress.    Appearance: Normal appearance. She is not ill-appearing, toxic-appearing or diaphoretic.  HENT:     Head: Normocephalic and atraumatic.     Right Ear: External ear normal.     Left Ear: External ear normal.     Nose: Nose normal.     Mouth/Throat:     Mouth: Mucous membranes are moist.     Pharynx: Oropharynx is clear. No oropharyngeal exudate or posterior oropharyngeal erythema.  Eyes:     Extraocular Movements: Extraocular movements intact.     Pupils: Pupils are equal, round, and reactive to light.  Cardiovascular:     Rate and Rhythm: Normal rate and regular rhythm.     Pulses: Normal pulses.     Heart sounds: Normal heart sounds. No murmur heard.  No friction rub. No gallop.   Pulmonary:      Effort: Pulmonary effort is normal. No respiratory distress.     Breath sounds: Normal breath sounds. No stridor. No wheezing, rhonchi or rales.  Abdominal:     General: Abdomen is flat.     Palpations: Abdomen is soft.     Tenderness: There is no abdominal tenderness. There is no right CVA tenderness or left CVA tenderness.  Musculoskeletal:        General: Normal range of motion.     Cervical back: Normal range of motion and neck supple. No tenderness.  Skin:    General: Skin is warm and dry.  Neurological:     General: No focal deficit present.     Mental Status: She is alert and oriented to person,  place, and time.  Psychiatric:        Mood and Affect: Mood normal.        Behavior: Behavior normal.    ED Results / Procedures / Treatments   Labs (all labs ordered are listed, but only abnormal results are displayed) Labs Reviewed  URINALYSIS, ROUTINE W REFLEX MICROSCOPIC - Abnormal; Notable for the following components:      Result Value   Color, Urine AMBER (*)    APPearance CLOUDY (*)    Glucose, UA >=500 (*)    Hgb urine dipstick LARGE (*)    Protein, ur 100 (*)    Leukocytes,Ua MODERATE (*)    RBC / HPF >50 (*)    WBC, UA >50 (*)    Bacteria, UA FEW (*)    All other components within normal limits  POC URINE PREG, ED   EKG None  Radiology No results found.  Procedures Procedures (including critical care time)  Medications Ordered in ED Medications  fluconazole (DIFLUCAN) tablet 200 mg (has no administration in time range)   ED Course  I have reviewed the triage vital signs and the nursing notes.  Pertinent labs & imaging results that were available during my care of the patient were reviewed by me and considered in my medical decision making (see chart for details).    MDM Rules/Calculators/A&P                          Patient is a 44 year old female with history of HIV who presents due to mild dysuria, vaginal irritation, small amount of blood when  wiping status post urination.  Patient is seen frequently in the emergency department and has been seen for the symptoms in the past.  Previously she was given Diflucan as well as Keflex and she confirms relief with these medications.  Will provide prescriptions for both today.  There was significant glucosuria on her UA.  We will send a culture as well.  I recommended that she follow-up with her ID physician regarding her frequent symptoms.  Based on records it appears that her ID physician has recommended that she get a PCP.  I recommended this to the patient as well based on her also likely poorly controlled DM.  Her questions were answered and she was amicable to time of discharge.  She was given strict return precautions.  Vital signs are stable.  Patient discharged to home/self care.  Condition at discharge: Stable  Note: Portions of this report may have been transcribed using voice recognition software. Every effort was made to ensure accuracy; however, inadvertent computerized transcription errors may be present.    Final Clinical Impression(s) / ED Diagnoses Final diagnoses:  Dysuria  Acute cystitis with hematuria  Vaginal irritation   Rx / DC Orders ED Discharge Orders         Ordered    cephALEXin (KEFLEX) 500 MG capsule  3 times daily     Discontinue  Reprint     02/06/20 1339    fluconazole (DIFLUCAN) 200 MG tablet   Once     Discontinue  Reprint     02/06/20 1339           Rayna Sexton, PA-C 02/06/20 1341    Wyvonnia Dusky, MD 02/06/20 1346

## 2020-02-06 NOTE — Discharge Instructions (Signed)
Please take with your medications as prescribed.  I am prescribing 1 additional dose of Diflucan to take for your vaginal irritation.  Prescribed you Keflex to take for your symptoms of urinary tract infection.  Please work on finding a primary care provider.  I would recommend calling your insurance company to find primary care providers in her area.  It was a pleasure to meet you.  Please return to the emergency department if you develop any new or worsening symptoms.

## 2020-02-08 LAB — URINE CULTURE: Culture: >=100000 — AB

## 2020-02-09 NOTE — Progress Notes (Signed)
ED Antimicrobial Stewardship Positive Culture Follow Up   Beverly Johnson is an 44 y.o. female who presented to [facility] on 02/06/2020 with a chief complaint of  Chief Complaint  Patient presents with  . Dysuria    Recent Results (from the past 720 hour(s))  Urine culture     Status: Abnormal   Collection Time: 02/06/20  2:10 PM   Specimen: Urine, Clean Catch  Result Value Ref Range Status   Specimen Description URINE, CLEAN CATCH  Final   Special Requests   Final    NONE Performed at [facility], [address] [place], [place] [address]    Culture >=100,000 COLONIES/mL ENTEROBACTER CLOACAE (A)  Final   Report Status 02/08/2020 FINAL  Final   Organism ID, Bacteria ENTEROBACTER CLOACAE (A)  Final      Susceptibility   Enterobacter cloacae - MIC*    CEFAZOLIN >=64 RESISTANT Resistant     CIPROFLOXACIN <=0.25 SENSITIVE Sensitive     GENTAMICIN <=1 SENSITIVE Sensitive     IMIPENEM 1 SENSITIVE Sensitive     NITROFURANTOIN 64 INTERMEDIATE Intermediate     TRIMETH/SULFA >=320 RESISTANT Resistant     PIP/TAZO <=4 SENSITIVE Sensitive     * >=100,000 COLONIES/mL ENTEROBACTER CLOACAE    [x]  Treated with keflex, organism resistant to prescribed antimicrobial  New antibiotic prescription: Cipro  ED Provider: Pati Gallo PA-C   Bertis Ruddy 02/09/2020, 11:40 AM Clinical Pharmacist Monday - Friday phone -  [phone] Saturday - Sunday phone - [phone]

## 2020-02-11 ENCOUNTER — Telehealth: Payer: Self-pay | Admitting: Emergency Medicine

## 2020-02-11 NOTE — Telephone Encounter (Signed)
Post ED Visit - Positive Culture Follow-up: Unsuccessful Patient Follow-up  Culture assessed and recommendations reviewed by:  []  Elenor Quinones, Pharm.D. []  Heide Guile, Pharm.D., BCPS AQ-ID []  Parks Neptune, Pharm.D., BCPS []  Alycia Rossetti, Pharm.D., BCPS []  Roberdel, Pharm.D., BCPS, AAHIVP []  Legrand Como, Pharm.D., BCPS, AAHIVP [x]  Radene Journey, PharmD []  Vincenza Hews, PharmD, BCPS  Positive urine culture  []  Patient discharged without antimicrobial prescription and treatment is now indicated [x]  Organism is resistant to prescribed ED discharge antimicrobial []  Patient with positive blood cultures   Unable to contact patient after 3 attempts, letter will be sent to address on file  Milus Mallick 02/11/2020, 10:50 AM

## 2020-02-16 ENCOUNTER — Telehealth: Payer: Self-pay | Admitting: *Deleted

## 2020-02-16 NOTE — Telephone Encounter (Signed)
Contacted by pt in response to letter sent to address on file.  States she continues to have symptoms. Cipro 500mg  PO BID x 5 days per Pati Gallo, PA-C called to [employer].

## 2020-02-25 ENCOUNTER — Emergency Department (HOSPITAL_COMMUNITY)
Admission: EM | Admit: 2020-02-25 | Discharge: 2020-02-25 | Disposition: A | Discharge status: Home / Self Care | Payer: Medicaid Other | Attending: Emergency Medicine | Admitting: Emergency Medicine

## 2020-02-25 ENCOUNTER — Encounter (HOSPITAL_COMMUNITY): Payer: Self-pay | Admitting: Emergency Medicine

## 2020-02-25 ENCOUNTER — Other Ambulatory Visit: Payer: Self-pay

## 2020-02-25 ENCOUNTER — Emergency Department (HOSPITAL_COMMUNITY): Payer: Medicaid Other

## 2020-02-25 DIAGNOSIS — R0789 Other chest pain: Secondary | ICD-10-CM | POA: Insufficient documentation

## 2020-02-25 DIAGNOSIS — E1165 Type 2 diabetes mellitus with hyperglycemia: Secondary | ICD-10-CM | POA: Insufficient documentation

## 2020-02-25 DIAGNOSIS — D649 Anemia, unspecified: Secondary | ICD-10-CM | POA: Diagnosis not present

## 2020-02-25 DIAGNOSIS — B20 Human immunodeficiency virus [HIV] disease: Secondary | ICD-10-CM | POA: Insufficient documentation

## 2020-02-25 DIAGNOSIS — R079 Chest pain, unspecified: Secondary | ICD-10-CM | POA: Diagnosis not present

## 2020-02-25 LAB — HEPATIC FUNCTION PANEL
ALT: 14 U/L (ref 0–44)
AST: 22 U/L (ref 15–41)
Albumin: 3.1 g/dL — ABNORMAL LOW (ref 3.5–5.0)
Alkaline Phosphatase: 95 U/L (ref 38–126)
Bilirubin, Direct: <0.1 mg/dL (ref 0.0–0.2)
Total Bilirubin: 0.4 mg/dL (ref 0.3–1.2)
Total Protein: 7.6 g/dL (ref 6.5–8.1)

## 2020-02-25 LAB — CBC
HCT: 31.3 % — ABNORMAL LOW (ref 36.0–46.0)
Hemoglobin: 8.9 g/dL — ABNORMAL LOW (ref 12.0–15.0)
MCH: 21 pg — ABNORMAL LOW (ref 26.0–34.0)
MCHC: 28.4 g/dL — ABNORMAL LOW (ref 30.0–36.0)
MCV: 73.8 fL — ABNORMAL LOW (ref 80.0–100.0)
Platelets: 169 10*3/uL (ref 150–400)
RBC: 4.24 MIL/uL (ref 3.87–5.11)
RDW: 14.4 % (ref 11.5–15.5)
WBC: 3.1 10*3/uL — ABNORMAL LOW (ref 4.0–10.5)
nRBC: 0 % (ref 0.0–0.2)

## 2020-02-25 LAB — LIPASE, BLOOD: Lipase: 41 U/L (ref 11–51)

## 2020-02-25 LAB — BASIC METABOLIC PANEL
Anion gap: 8 (ref 5–15)
BUN: 6 mg/dL (ref 6–20)
CO2: 23 mmol/L (ref 22–32)
Calcium: 8.8 mg/dL — ABNORMAL LOW (ref 8.9–10.3)
Chloride: 105 mmol/L (ref 98–111)
Creatinine, Ser: 0.58 mg/dL (ref 0.44–1.00)
GFR calc Af Amer: >60 mL/min (ref >60)
GFR calc non Af Amer: >60 mL/min (ref >60)
Glucose, Bld: 310 mg/dL — ABNORMAL HIGH (ref 70–99)
Potassium: 3.8 mmol/L (ref 3.5–5.1)
Sodium: 136 mmol/L (ref 135–145)

## 2020-02-25 LAB — TROPONIN I (HIGH SENSITIVITY)
Troponin I (High Sensitivity): 3 ng/L (ref <18)
Troponin I (High Sensitivity): 3 ng/L (ref <18)

## 2020-02-25 LAB — I-STAT BETA HCG BLOOD, ED (MC, WL, AP ONLY): I-stat hCG, quantitative: <5 m[IU]/mL (ref <5)

## 2020-02-25 MED ORDER — LIDOCAINE 5 % EX PTCH
1.0000 | MEDICATED_PATCH | CUTANEOUS | Status: DC
  Administered 2020-02-25: 1 via TRANSDERMAL
  Filled 2020-02-25: qty 1

## 2020-02-25 MED ORDER — SODIUM CHLORIDE 0.9% FLUSH: 3.0000 mL | Freq: Once | INTRAVENOUS | Status: DC

## 2020-02-25 MED ORDER — CYCLOBENZAPRINE HCL 10 MG PO TABS
10.0000 mg | ORAL_TABLET | Freq: Once | ORAL | Status: AC
  Administered 2020-02-25: 10 mg via ORAL
  Filled 2020-02-25: qty 1

## 2020-02-25 MED ORDER — CYCLOBENZAPRINE HCL 10 MG PO TABS
10.0000 mg | ORAL_TABLET | Freq: Two times a day (BID) | ORAL | 0 refills | Status: DC | PRN
Start: 2020-02-25 — End: 2020-11-08

## 2020-02-25 MED ORDER — MORPHINE SULFATE (PF) 4 MG/ML IV SOLN
4.0000 mg | Freq: Once | INTRAVENOUS | Status: AC
  Administered 2020-02-25: 4 mg via INTRAVENOUS
  Filled 2020-02-25: qty 1

## 2020-02-25 MED ORDER — KETOROLAC TROMETHAMINE 30 MG/ML IJ SOLN
15.0000 mg | Freq: Once | INTRAMUSCULAR | Status: AC
  Administered 2020-02-25: 15 mg via INTRAVENOUS
  Filled 2020-02-25: qty 1

## 2020-02-25 NOTE — ED Triage Notes (Signed)
Pt to ED via [facility] from home.  Reports pain to center of chest since this morning.  Pain increases with movement, palpation, and inspiration.  Pain sharp and radiates to back.  ASA 324mg  and Tylenol 1000mg  per EMS.  Denies SOB, nausea, vomiting.  CBG 331.

## 2020-02-25 NOTE — ED Provider Notes (Signed)
[facility] EMERGENCY DEPARTMENT Provider Note   CSN: [id number] Arrival date & time: 02/25/20  1106     History Chief Complaint  Patient presents with  . Chest Pain    Beverly Johnson is a 44 y.o. female with history of controlled HIV and diabetes who presents with chest pain.  Patient states that it started acutely this morning when she woke up.  It is sharp, diffuse and radiates to the left and to the right side and to her back.  She denies any heavy lifting or injury to the chest. It's worse with movement and palpitations. She denies any other associated symptoms.  Specifically no fever, chills, dizziness, cough, shortness of breath, abdominal pain, nausea, vomiting, sweats.  She states she has had similar pain before but is never lasted this long.  She called EMS and they gave her Tylenol and aspirin and she has not had any relief of her pain.  She denies any bleeding problems or seeing blood in the stool.  No history of cardiac or lung disease.  HPI     Past Medical History:  Diagnosis Date  . Diabetes mellitus without complication ([place])   . Gallstones 03/10/2011  . HIV infection ([facility])   . Immune deficiency disorder [facility])     Patient Active Problem List   Diagnosis Date Noted  . Hyperglycemia 06/16/2019  . History of 2019 novel coronavirus disease (COVID-19) 06/16/2019  . Dehydration 06/16/2019  . AKI (acute kidney injury) ([place]) 06/16/2019  . Candidal esophagitis ([place]) 06/16/2019  . Abnormal ECG 06/16/2019  . Acute respiratory disease due to COVID-19 virus 06/02/2019  . Medication monitoring encounter 11/12/2017  . Breast nodule 09/02/2017  . Screening examination for venereal disease 03/30/2017  . Encounter for long-term (current) use of high-risk medication 03/30/2017  . Microalbuminuria due to type 2 diabetes mellitus ([place]) 03/30/2017  . Knee pain, chronic 03/30/2017  . Vitamin D deficiency 03/21/2017  . Bilateral leg cramps 01/22/2017  .  Healthcare maintenance 03/02/2014  . Morbid obesity with BMI of 45.0-49.9, adult ([place]) 04/27/2012  . Uncontrolled type 2 diabetes mellitus ([place]) 12/08/2010  . Human immunodeficiency virus (HIV) disease ([place]) 09/29/2006    Past Surgical History:  Procedure Laterality Date  . CHOLECYSTECTOMY  06/29/2011   Procedure: LAPAROSCOPIC CHOLECYSTECTOMY WITH INTRAOPERATIVE CHOLANGIOGRAM;  Surgeon: Merrie Roof, MD;  Location: [facility];  Service: General;  Laterality: N/A;  . NO PAST SURGERIES       OB History   No obstetric history on file.     Family History  Problem Relation Age of Onset  . Healthy Mother   . Healthy Father     Social History   Tobacco Use  . Smoking status: Never Smoker  . Smokeless tobacco: Never Used  Vaping Use  . Vaping Use: Never used  Substance Use Topics  . Alcohol use: No  . Drug use: No    Home Medications Prior to Admission medications   Medication Sig Start Date End Date Taking? Authorizing Provider  benzonatate (TESSALON) 100 MG capsule Take 1 capsule (100 mg total) by mouth every 8 (eight) hours. 11/26/19   Henderly, Britni A, PA-C  cetirizine (ZYRTEC ALLERGY) 10 MG tablet Take 1 tablet (10 mg total) by mouth daily. 11/26/19   Henderly, Britni A, PA-C  fluticasone (FLONASE) 50 MCG/ACT nasal spray Place 1 spray into both nostrils daily. 11/26/19   Henderly, Britni A, PA-C  GENVOYA 150-150-200-10 MG TABS tablet TAKE 1 TABLET BY MOUTH DAILY  WITH BREAKFAST. Patient taking differently: Take 1 tablet by mouth daily with breakfast.  09/02/18   Comer, Okey Regal, MD  glucose blood test strip Check your sugar in the morning before you eat breakfast, and one hour after a meal. 05/26/19   Melynda Ripple, MD  glucose monitoring kit (FREESTYLE) monitoring kit 1 each by Does not apply route daily. Check glucose once in the morning before breakfast and 1 hour after a meal 05/26/19   Melynda Ripple, MD  ibuprofen (ADVIL) 600 MG tablet Take 1 tablet (600 mg total) by  mouth every 6 (six) hours as needed. Patient taking differently: Take 600 mg by mouth every 6 (six) hours as needed for mild pain.  11/04/19   Faustino Congress, NP  metFORMIN (GLUCOPHAGE) 500 MG tablet Take 2 tablets (1,000 mg total) by mouth 2 (two) times daily with a meal. 03/22/17 09/08/26  Ledell Noss, MD  methocarbamol (ROBAXIN) 500 MG tablet Take 1 tablet (500 mg total) by mouth 2 (two) times daily. 11/04/19   Faustino Congress, NP  sitaGLIPtin (JANUVIA) 100 MG tablet Take 1 tablet (100 mg total) by mouth daily. 08/05/17   Neva Seat, MD    Allergies    Patient has no known allergies.  Review of Systems   Review of Systems  Constitutional: Negative for chills and fever.  Respiratory: Negative for cough, shortness of breath and wheezing.   Cardiovascular: Positive for chest pain. Negative for palpitations and leg swelling.  Gastrointestinal: Negative for abdominal pain, diarrhea, nausea and vomiting.  Genitourinary: Negative for dysuria.  Neurological: Negative for dizziness and syncope.  All other systems reviewed and are negative.   Physical Exam Updated Vital Signs BP 122/83 (BP Location: Left Arm)   Pulse 72   Temp 98.4 F (36.9 C) (Oral)   Resp 20   Ht _0  (1.702 m)   Wt 117.9 kg   LMP 02/11/2020   SpO2 100%   BMI 40.72 kg/m   Physical Exam Vitals and nursing note reviewed.  Constitutional:      General: She is not in acute distress.    Appearance: Normal appearance. She is well-developed. She is obese. She is not ill-appearing.  HENT:     Head: Normocephalic and atraumatic.  Eyes:     General: No scleral icterus.       Right eye: No discharge.        Left eye: No discharge.     Conjunctiva/sclera: Conjunctivae normal.     Pupils: Pupils are equal, round, and reactive to light.  Cardiovascular:     Rate and Rhythm: Normal rate and regular rhythm.  Pulmonary:     Effort: Pulmonary effort is normal. No respiratory distress.     Breath sounds: Normal  breath sounds.  Chest:     Chest wall: Tenderness (diffusely tender) present.  Abdominal:     General: There is no distension.     Palpations: Abdomen is soft.     Tenderness: There is abdominal tenderness (LUQ).  Musculoskeletal:     Cervical back: Normal range of motion.  Skin:    General: Skin is warm and dry.  Neurological:     Mental Status: She is alert and oriented to person, place, and time.  Psychiatric:        Behavior: Behavior normal.     ED Results / Procedures / Treatments   Labs (all labs ordered are listed, but only abnormal results are displayed) Labs Reviewed  BASIC METABOLIC PANEL - Abnormal; Notable  for the following components:      Result Value   Glucose, Bld 310 (*)    Calcium 8.8 (*)    All other components within normal limits  CBC - Abnormal; Notable for the following components:   WBC 3.1 (*)    Hemoglobin 8.9 (*)    HCT 31.3 (*)    MCV 73.8 (*)    MCH 21.0 (*)    MCHC 28.4 (*)    All other components within normal limits  HEPATIC FUNCTION PANEL - Abnormal; Notable for the following components:   Albumin 3.1 (*)    All other components within normal limits  LIPASE, BLOOD  I-STAT BETA HCG BLOOD, ED ([facility], [facility], [facility] ONLY)  TROPONIN I (HIGH SENSITIVITY)  TROPONIN I (HIGH SENSITIVITY)    EKG None  Radiology DG Chest 2 View  Result Date: 02/25/2020 CLINICAL DATA:  Chest pain EXAM: CHEST - 2 VIEW COMPARISON:  November 26, 2019 FINDINGS: The lungs are clear. The heart size and pulmonary vascularity are normal. No adenopathy. No pneumothorax. No bone lesions. IMPRESSION: Lungs clear.  Cardiac silhouette normal. Electronically Signed   By: Lowella Grip III M.D.   On: 02/25/2020 11:55    Procedures Procedures (including critical care time)  Medications Ordered in ED Medications  sodium chloride flush (NS) 0.9 % injection 3 mL (has no administration in time range)  lidocaine (LIDODERM) 5 % 1 patch (1 patch Transdermal Patch Applied 02/25/20 1756)   morphine 4 MG/ML injection 4 mg (4 mg Intravenous Given 02/25/20 1637)  ketorolac (TORADOL) 30 MG/ML injection 15 mg (15 mg Intravenous Given 02/25/20 1637)  cyclobenzaprine (FLEXERIL) tablet 10 mg (10 mg Oral Given 02/25/20 1756)    ED Course  I have reviewed the triage vital signs and the nursing notes.  Pertinent labs & imaging results that were available during my care of the patient were reviewed by me and considered in my medical decision making (see chart for details).  44 year old female with HIV and Diabetes who presents with diffuse chest pain. Pain is easily reproducible with palpation on exam. Chest pain work up is reassuring. Doubt ACS, PE, pericarditis, esophageal rupture, tension pneumothorax, aortic dissection, cardiac tamponade. EKG is NSR and shows no significant change since last. CXR is negative. Initial and second troponin is 3 and 3. Labs are remarkable for worsening anemia (hgb 8.9) and hyperglycemia (310). HEART score is 1. She is PERC negative so doubt PE. Pt does not currently have a PCP. She was states she was discharged from the IM clinic downstairs due to not coming to appointments. She has Medicaid and was encouraged to re-establish with a PCP.  MDM Rules/Calculators/A&P                           Final Clinical Impression(s) / ED Diagnoses Final diagnoses:  Atypical chest pain  Anemia, unspecified type    Rx / DC Orders ED Discharge Orders    None       Recardo Evangelist, PA-C 02/25/20 1936    Varney Biles, MD 02/25/20 2123

## 2020-02-25 NOTE — Discharge Instructions (Signed)
Take extra strength Tylenol as needed for the next week.  Take muscle relaxer at bedtime to help with stiff/sore muscles. This medicine makes you drowsy so do not take before driving or work Use a heating pad for sore muscles - use for 20 minutes several times a day Try gentle range of motion exercises of the arms and neck Follow up with your infectious disease doctor and establish care with a primary doctor to further evaluate your anemia Return for worsening symptoms

## 2020-04-09 ENCOUNTER — Ambulatory Visit: Payer: Medicaid Other | Attending: Internal Medicine

## 2020-04-09 DIAGNOSIS — Z23 Encounter for immunization: Secondary | ICD-10-CM

## 2020-04-23 ENCOUNTER — Other Ambulatory Visit: Payer: Self-pay

## 2020-04-23 ENCOUNTER — Emergency Department (HOSPITAL_COMMUNITY)
Admission: EM | Admit: 2020-04-23 | Discharge: 2020-04-23 | Disposition: A | Discharge status: Home / Self Care | Payer: Medicaid Other | Attending: Emergency Medicine | Admitting: Emergency Medicine

## 2020-04-23 ENCOUNTER — Encounter (HOSPITAL_COMMUNITY): Payer: Self-pay | Admitting: Emergency Medicine

## 2020-04-23 DIAGNOSIS — Z5321 Procedure and treatment not carried out due to patient leaving prior to being seen by health care provider: Secondary | ICD-10-CM | POA: Diagnosis not present

## 2020-04-23 DIAGNOSIS — R531 Weakness: Secondary | ICD-10-CM | POA: Diagnosis not present

## 2020-04-23 LAB — BASIC METABOLIC PANEL
Anion gap: 10 (ref 5–15)
BUN: 8 mg/dL (ref 6–20)
CO2: 21 mmol/L — ABNORMAL LOW (ref 22–32)
Calcium: 8.6 mg/dL — ABNORMAL LOW (ref 8.9–10.3)
Chloride: 105 mmol/L (ref 98–111)
Creatinine, Ser: 0.66 mg/dL (ref 0.44–1.00)
GFR calc Af Amer: >60 mL/min (ref >60)
GFR calc non Af Amer: >60 mL/min (ref >60)
Glucose, Bld: 318 mg/dL — ABNORMAL HIGH (ref 70–99)
Potassium: 3.5 mmol/L (ref 3.5–5.1)
Sodium: 136 mmol/L (ref 135–145)

## 2020-04-23 LAB — CBC
HCT: 29.5 % — ABNORMAL LOW (ref 36.0–46.0)
Hemoglobin: 8.4 g/dL — ABNORMAL LOW (ref 12.0–15.0)
MCH: 20.8 pg — ABNORMAL LOW (ref 26.0–34.0)
MCHC: 28.5 g/dL — ABNORMAL LOW (ref 30.0–36.0)
MCV: 73.2 fL — ABNORMAL LOW (ref 80.0–100.0)
Platelets: 178 10*3/uL (ref 150–400)
RBC: 4.03 MIL/uL (ref 3.87–5.11)
RDW: 14.7 % (ref 11.5–15.5)
WBC: 3.6 10*3/uL — ABNORMAL LOW (ref 4.0–10.5)
nRBC: 0 % (ref 0.0–0.2)

## 2020-04-23 LAB — CBG MONITORING, ED: Glucose-Capillary: 313 mg/dL — ABNORMAL HIGH (ref 70–99)

## 2020-04-23 LAB — I-STAT BETA HCG BLOOD, ED (MC, WL, AP ONLY): I-stat hCG, quantitative: <5 m[IU]/mL (ref <5)

## 2020-04-23 NOTE — ED Triage Notes (Signed)
Pt endorses weakness starting today. Denies any pain, N/V. CBG 313 in triage.

## 2020-04-23 NOTE — ED Notes (Signed)
Pt notified staff that she was leaving 

## 2020-04-24 ENCOUNTER — Telehealth: Payer: Self-pay | Admitting: *Deleted

## 2020-04-24 NOTE — Telephone Encounter (Signed)
Beverly Johnson presented to the ED and left before being seen by the provider on 04/23/20. The patient has been enrolled in an automated general discharge outreach program and 2 attempts to contact the patient will be made to follow up on their ED visit and subsequent needs. The care management team is available to provide assistance to this patient at any time.   Lenor Coffin, RN, BSN, [place] Patient [place] [phone]

## 2020-04-30 ENCOUNTER — Ambulatory Visit: Payer: Medicaid Other | Attending: Internal Medicine

## 2020-04-30 DIAGNOSIS — Z23 Encounter for immunization: Secondary | ICD-10-CM

## 2020-04-30 NOTE — Progress Notes (Signed)
   Covid-19 Vaccination Clinic  Name:  Beverly Johnson    MRN: [id number] DOB: 1975-12-11  04/30/2020  Beverly Johnson was observed post Covid-19 immunization for 15 minutes without incident. She was provided with Vaccine Information Sheet and instruction to access the V-Safe system.   Beverly Johnson was instructed to call 911 with any severe reactions post vaccine: Marland Kitchen Difficulty breathing  . Swelling of face and throat  . A fast heartbeat  . A bad rash all over body  . Dizziness and weakness   Immunizations Administered    Name Date Dose VIS Date Route   Pfizer COVID-19 Vaccine 04/30/2020  9:26 AM 0.3 mL 10/11/2018 Intramuscular   Manufacturer: [place]   Lot: [id number]   [place]: [id number]

## 2020-05-10 ENCOUNTER — Encounter (HOSPITAL_COMMUNITY): Payer: Self-pay

## 2020-05-10 ENCOUNTER — Ambulatory Visit (HOSPITAL_COMMUNITY)
Admission: EM | Admit: 2020-05-10 | Discharge: 2020-05-10 | Disposition: A | Discharge status: Home / Self Care | Payer: Medicaid Other | Attending: Internal Medicine | Admitting: Internal Medicine

## 2020-05-10 ENCOUNTER — Other Ambulatory Visit: Payer: Self-pay

## 2020-05-10 DIAGNOSIS — Z6841 Body Mass Index (BMI) 40.0 and over, adult: Secondary | ICD-10-CM | POA: Diagnosis not present

## 2020-05-10 DIAGNOSIS — Z8616 Personal history of COVID-19: Secondary | ICD-10-CM | POA: Insufficient documentation

## 2020-05-10 DIAGNOSIS — Z7984 Long term (current) use of oral hypoglycemic drugs: Secondary | ICD-10-CM | POA: Diagnosis not present

## 2020-05-10 DIAGNOSIS — Z21 Asymptomatic human immunodeficiency virus [HIV] infection status: Secondary | ICD-10-CM | POA: Insufficient documentation

## 2020-05-10 DIAGNOSIS — E1165 Type 2 diabetes mellitus with hyperglycemia: Secondary | ICD-10-CM | POA: Insufficient documentation

## 2020-05-10 DIAGNOSIS — R350 Frequency of micturition: Secondary | ICD-10-CM | POA: Diagnosis not present

## 2020-05-10 DIAGNOSIS — R300 Dysuria: Secondary | ICD-10-CM

## 2020-05-10 DIAGNOSIS — Z79899 Other long term (current) drug therapy: Secondary | ICD-10-CM | POA: Diagnosis not present

## 2020-05-10 DIAGNOSIS — B9689 Other specified bacterial agents as the cause of diseases classified elsewhere: Secondary | ICD-10-CM | POA: Diagnosis not present

## 2020-05-10 DIAGNOSIS — B373 Candidiasis of vulva and vagina: Secondary | ICD-10-CM | POA: Diagnosis not present

## 2020-05-10 DIAGNOSIS — N760 Acute vaginitis: Secondary | ICD-10-CM | POA: Insufficient documentation

## 2020-05-10 DIAGNOSIS — Z9049 Acquired absence of other specified parts of digestive tract: Secondary | ICD-10-CM | POA: Diagnosis not present

## 2020-05-10 LAB — POCT URINALYSIS DIPSTICK, ED / UC
Bilirubin Urine: NEGATIVE
Glucose, UA: >=1000 mg/dL — AB
Ketones, ur: NEGATIVE mg/dL
Nitrite: NEGATIVE
Protein, ur: NEGATIVE mg/dL
Specific Gravity, Urine: 1.015 (ref 1.005–1.030)
Urobilinogen, UA: 0.2 mg/dL (ref 0.0–1.0)
pH: 5.5 (ref 5.0–8.0)

## 2020-05-10 LAB — CBG MONITORING, ED: Glucose-Capillary: 374 mg/dL — ABNORMAL HIGH (ref 70–99)

## 2020-05-10 MED ORDER — FLUCONAZOLE 150 MG PO TABS: 150.0000 mg | ORAL_TABLET | Freq: Every day | ORAL | 0 refills | Status: DC

## 2020-05-10 NOTE — Discharge Instructions (Signed)
I believe your elevated glucose is causing her frequent urination, however we sent a culture of your urine and will call you if we need to add an antibiotic  Based on your exam today I believe you have a yeast infection, I want you to take one of the Diflucan today, repeat in 3 days if symptoms have not improved  We have sent your lab test and will call you if we need to change any treatments and notify of any positive results.  Otherwise easily in your MyChart  You need to establish with a primary care, call the family medicine center or the internal medicine center first thing Monday.  If you develop severe abdominal pain, high fevers or other concerning symptoms go to the emergency department

## 2020-05-10 NOTE — ED Triage Notes (Signed)
Pt c/o dysuria, urinary urgency and frequencyx4 days.

## 2020-05-10 NOTE — ED Provider Notes (Signed)
[facility]    CSN: [id number] Arrival date & time: 05/10/20  1718      History   Chief Complaint Chief Complaint  Patient presents with  . Dysuria    HPI Beverly Johnson is a 44 y.o. female.   Patient with known history of diabetes presents for frequent urination and urgency.  She also describes a" tingling when urinating".  Symptoms been present for 4 days.  She also reports vaginal irritation, itching and a slight white discharge.  She denies lower abdominal pain or vaginal pain.  Denies nausea, vomiting or diarrhea.  Denies fever or chills.  Reports she checks her sugar and is between 200 and 300.  She has been taking her Metformin.     Past Medical History:  Diagnosis Date  . Diabetes mellitus without complication ([place])   . Gallstones 03/10/2011  . HIV infection ([place])   . Immune deficiency disorder [facility])     Patient Active Problem List   Diagnosis Date Noted  . Hyperglycemia 06/16/2019  . History of 2019 novel coronavirus disease (COVID-19) 06/16/2019  . Dehydration 06/16/2019  . AKI (acute kidney injury) ([place]) 06/16/2019  . Candidal esophagitis ([place]) 06/16/2019  . Abnormal ECG 06/16/2019  . Acute respiratory disease due to COVID-19 virus 06/02/2019  . Medication monitoring encounter 11/12/2017  . Breast nodule 09/02/2017  . Screening examination for venereal disease 03/30/2017  . Encounter for long-term (current) use of high-risk medication 03/30/2017  . Microalbuminuria due to type 2 diabetes mellitus ([place]) 03/30/2017  . Knee pain, chronic 03/30/2017  . Vitamin D deficiency 03/21/2017  . Bilateral leg cramps 01/22/2017  . Healthcare maintenance 03/02/2014  . Morbid obesity with BMI of 45.0-49.9, adult ([place]) 04/27/2012  . Uncontrolled type 2 diabetes mellitus ([place]) 12/08/2010  . Human immunodeficiency virus (HIV) disease ([place]) 09/29/2006    Past Surgical History:  Procedure Laterality Date  . CHOLECYSTECTOMY  06/29/2011   Procedure:  LAPAROSCOPIC CHOLECYSTECTOMY WITH INTRAOPERATIVE CHOLANGIOGRAM;  Surgeon: Merrie Roof, MD;  Location: [facility];  Service: General;  Laterality: N/A;  . NO PAST SURGERIES      OB History   No obstetric history on file.      Home Medications    Prior to Admission medications   Medication Sig Start Date End Date Taking? Authorizing Provider  benzonatate (TESSALON) 100 MG capsule Take 1 capsule (100 mg total) by mouth every 8 (eight) hours. 11/26/19   Henderly, Britni A, PA-C  cetirizine (ZYRTEC ALLERGY) 10 MG tablet Take 1 tablet (10 mg total) by mouth daily. 11/26/19   Henderly, Britni A, PA-C  cyclobenzaprine (FLEXERIL) 10 MG tablet Take 1 tablet (10 mg total) by mouth 2 (two) times daily as needed for muscle spasms. 02/25/20   Recardo Evangelist, PA-C  fluconazole (DIFLUCAN) 150 MG tablet Take 1 tablet (150 mg total) by mouth daily. Take 1 tablet, repeat in 3 days if symptoms persist 05/10/20   Beda Dula, Marguerita Beards, PA-C  fluticasone (FLONASE) 50 MCG/ACT nasal spray Place 1 spray into both nostrils daily. 11/26/19   Henderly, Britni A, PA-C  GENVOYA 150-150-200-10 MG TABS tablet TAKE 1 TABLET BY MOUTH DAILY WITH BREAKFAST. Patient taking differently: Take 1 tablet by mouth daily with breakfast.  09/02/18   Comer, Okey Regal, MD  glucose blood test strip Check your sugar in the morning before you eat breakfast, and one hour after a meal. 05/26/19   Melynda Ripple, MD  glucose monitoring kit (FREESTYLE) monitoring kit 1 each by Does  not apply route daily. Check glucose once in the morning before breakfast and 1 hour after a meal 05/26/19   Melynda Ripple, MD  ibuprofen (ADVIL) 600 MG tablet Take 1 tablet (600 mg total) by mouth every 6 (six) hours as needed. Patient taking differently: Take 600 mg by mouth every 6 (six) hours as needed for mild pain.  11/04/19   Faustino Congress, NP  metFORMIN (GLUCOPHAGE) 500 MG tablet Take 2 tablets (1,000 mg total) by mouth 2 (two) times daily with a meal. 03/22/17  09/08/26  Ledell Noss, MD  methocarbamol (ROBAXIN) 500 MG tablet Take 1 tablet (500 mg total) by mouth 2 (two) times daily. 11/04/19   Faustino Congress, NP  sitaGLIPtin (JANUVIA) 100 MG tablet Take 1 tablet (100 mg total) by mouth daily. 08/05/17   Marcelyn Bruins, MD    Family History Family History  Problem Relation Age of Onset  . Healthy Mother   . Healthy Father     Social History Social History   Tobacco Use  . Smoking status: Never Smoker  . Smokeless tobacco: Never Used  Vaping Use  . Vaping Use: Never used  Substance Use Topics  . Alcohol use: No  . Drug use: No     Allergies   Patient has no known allergies.   Review of Systems Review of Systems   Physical Exam Triage Vital Signs ED Triage Vitals  Enc Vitals Group     BP 05/10/20 1903 (!) 153/91     Pulse Rate 05/10/20 1903 89     Resp 05/10/20 1903 16     Temp 05/10/20 1903 99.6 F (37.6 C)     Temp Source 05/10/20 1903 Oral     SpO2 05/10/20 1903 100 %     Weight 05/10/20 1904 260 lb (117.9 kg)     Height 05/10/20 1904 _0  (1.702 m)     Head Circumference --      Peak Flow --      Pain Score 05/10/20 1903 0     Pain Loc --      Pain Edu? --      Excl. in [place]? --    No data found.  Updated Vital Signs BP (!) 153/91   Pulse 89   Temp 99.6 F (37.6 C) (Oral)   Resp 16   Ht _1  (1.702 m)   Wt 260 lb (117.9 kg)   SpO2 100%   BMI 40.72 kg/m   Visual Acuity Right Eye Distance:   Left Eye Distance:   Bilateral Distance:    Right Eye Near:   Left Eye Near:    Bilateral Near:     Physical Exam Vitals and nursing note reviewed.  Constitutional:      General: She is not in acute distress.    Appearance: She is well-developed. She is not ill-appearing.  HENT:     Head: Normocephalic and atraumatic.  Eyes:     Conjunctiva/sclera: Conjunctivae normal.  Cardiovascular:     Rate and Rhythm: Normal rate and regular rhythm.     Heart sounds: No murmur heard.   Pulmonary:      Effort: Pulmonary effort is normal. No respiratory distress.     Breath sounds: Normal breath sounds. No wheezing, rhonchi or rales.  Abdominal:     Palpations: Abdomen is soft.     Tenderness: There is no abdominal tenderness.  Genitourinary:    Comments: External vulva without swelling or irritation.  There is thick white  vaginal discharge present.  Cervix closed, no irritation or friability.  No cervical motion tenderness. Musculoskeletal:     Cervical back: Neck supple.  Skin:    General: Skin is warm and dry.  Neurological:     Mental Status: She is alert.      UC Treatments / Results  Labs (all labs ordered are listed, but only abnormal results are displayed) Labs Reviewed  POCT URINALYSIS DIPSTICK, ED / UC - Abnormal; Notable for the following components:      Result Value   Glucose, UA >=1000 (*)    Hgb urine dipstick TRACE (*)    Leukocytes,Ua TRACE (*)    All other components within normal limits  CBG MONITORING, ED - Abnormal; Notable for the following components:   Glucose-Capillary 374 (*)    All other components within normal limits  URINE CULTURE  CERVICOVAGINAL ANCILLARY ONLY    EKG   Radiology No results found.  Procedures Procedures (including critical care time)  Medications Ordered in UC Medications - No data to display  Initial Impression / Assessment and Plan / UC Course  I have reviewed the triage vital signs and the nursing notes.  Pertinent labs & imaging results that were available during my care of the patient were reviewed by me and considered in my medical decision making (see chart for details).     #Vaginitis #Urinary frequency #Uncontrolled type 2 diabetes Patient is a 44 year old with uncontrolled type 2 diabetes presenting with what appears to be a yeast vaginitis.  UA with glucosuria and trace leukocytes, will culture this.  Given vaginal exam consistent with yeast vaginitis, feel that this may be causing her urinary  irritation symptoms, and her glucose levels causing her frequent urination.  We will treat with Diflucan.  Cervical swab sent.  Highly encourage patient to establish a primary care provider for better management of her diabetes.  Discussed return, follow-up and emergency part precautions.  Primary care and OB/GYN options given.  Patient verbalized agreement and understanding plan of care Final Clinical Impressions(s) / UC Diagnoses   Final diagnoses:  Vaginitis and vulvovaginitis  Urinary frequency  Uncontrolled type 2 diabetes mellitus with hyperglycemia ([place])     Discharge Instructions     I believe your elevated glucose is causing her frequent urination, however we sent a culture of your urine and will call you if we need to add an antibiotic  Based on your exam today I believe you have a yeast infection, I want you to take one of the Diflucan today, repeat in 3 days if symptoms have not improved  We have sent your lab test and will call you if we need to change any treatments and notify of any positive results.  Otherwise easily in your MyChart  You need to establish with a primary care, call the family medicine center or the internal medicine center first thing Monday.  If you develop severe abdominal pain, high fevers or other concerning symptoms go to the emergency department      ED Prescriptions    Medication Sig Dispense Auth. Provider   fluconazole (DIFLUCAN) 150 MG tablet Take 1 tablet (150 mg total) by mouth daily. Take 1 tablet, repeat in 3 days if symptoms persist 2 tablet Krystianna Soth, Marguerita Beards, PA-C     PDMP not reviewed this encounter.   Purnell Shoemaker, PA-C 05/10/20 2142

## 2020-05-13 ENCOUNTER — Other Ambulatory Visit: Payer: Medicaid Other

## 2020-05-13 ENCOUNTER — Other Ambulatory Visit: Payer: Self-pay

## 2020-05-13 DIAGNOSIS — Z113 Encounter for screening for infections with a predominantly sexual mode of transmission: Secondary | ICD-10-CM

## 2020-05-13 DIAGNOSIS — B20 Human immunodeficiency virus [HIV] disease: Secondary | ICD-10-CM | POA: Diagnosis not present

## 2020-05-13 LAB — CERVICOVAGINAL ANCILLARY ONLY
Bacterial Vaginitis (gardnerella): POSITIVE — AB
Candida Glabrata: NEGATIVE
Candida Vaginitis: POSITIVE — AB
Chlamydia: NEGATIVE
Comment: NEGATIVE
Comment: NEGATIVE
Comment: NEGATIVE
Comment: NEGATIVE
Comment: NEGATIVE
Comment: NORMAL
Neisseria Gonorrhea: NEGATIVE
Trichomonas: NEGATIVE

## 2020-05-13 LAB — URINE CULTURE: Culture: >=100000 — AB

## 2020-05-14 ENCOUNTER — Telehealth (HOSPITAL_COMMUNITY): Payer: Self-pay | Admitting: Emergency Medicine

## 2020-05-14 LAB — T-HELPER CELL (CD4) - (RCID CLINIC ONLY)
CD4 % Helper T Cell: 37 % (ref 33–65)
CD4 T Cell Abs: 648 /uL (ref 400–1790)

## 2020-05-14 MED ORDER — METRONIDAZOLE 500 MG PO TABS: 500.0000 mg | ORAL_TABLET | Freq: Two times a day (BID) | ORAL | 0 refills | Status: DC

## 2020-05-14 MED ORDER — NITROFURANTOIN MONOHYD MACRO 100 MG PO CAPS: 100.0000 mg | ORAL_CAPSULE | Freq: Two times a day (BID) | ORAL | 0 refills | Status: DC

## 2020-05-16 LAB — HIV-1 RNA QUANT-NO REFLEX-BLD
HIV 1 RNA Quant: 22 Copies/mL — ABNORMAL HIGH
HIV-1 RNA Quant, Log: 1.35 Log cps/mL — ABNORMAL HIGH

## 2020-05-16 LAB — RPR: RPR Ser Ql: NONREACTIVE

## 2020-05-27 ENCOUNTER — Ambulatory Visit (INDEPENDENT_AMBULATORY_CARE_PROVIDER_SITE_OTHER): Payer: Medicaid Other | Admitting: Internal Medicine

## 2020-05-27 ENCOUNTER — Encounter: Payer: Self-pay | Admitting: Internal Medicine

## 2020-05-27 ENCOUNTER — Other Ambulatory Visit: Payer: Self-pay

## 2020-05-27 VITALS — BP 125/83 | HR 86 | Temp 98.5°F | Ht 67.0 in | Wt 255.0 lb

## 2020-05-27 DIAGNOSIS — B20 Human immunodeficiency virus [HIV] disease: Secondary | ICD-10-CM | POA: Diagnosis not present

## 2020-05-27 DIAGNOSIS — E1165 Type 2 diabetes mellitus with hyperglycemia: Secondary | ICD-10-CM

## 2020-05-27 DIAGNOSIS — Z113 Encounter for screening for infections with a predominantly sexual mode of transmission: Secondary | ICD-10-CM | POA: Diagnosis not present

## 2020-05-27 DIAGNOSIS — Z79899 Other long term (current) drug therapy: Secondary | ICD-10-CM

## 2020-05-27 NOTE — Assessment & Plan Note (Signed)
I again expressed the need for her to get to a PCP to help manage this.  She voiced her understanding.

## 2020-05-27 NOTE — Assessment & Plan Note (Signed)
Doing well, no changes.  She will follow up in 6 months.

## 2020-05-27 NOTE — Progress Notes (Signed)
   Subjective:    Patient ID: Beverly Johnson, female    DOB: 04-Sep-1975, 44 y.o.   MRN: [id number]  HIV Positive/AIDS  Follow up for HIV She has been on Genvoya and denies any missed doses. CD4 of 648, viral load just 22.  No complaints today.  No associated n/v/d.  Monitors her sugar and recent Hgb A1c done in the study in [place] was 11.    Review of Systems  Constitutional: Negative for fatigue.  Gastrointestinal: Negative for diarrhea and nausea.  Skin: Negative for rash.       Objective:   Physical Exam Eyes:     General: No scleral icterus. Cardiovascular:     Rate and Rhythm: Normal rate and regular rhythm.  Pulmonary:     Effort: Pulmonary effort is normal.  Skin:    Findings: No rash.  Neurological:     Mental Status: She is alert.           Assessment & Plan:

## 2020-05-31 ENCOUNTER — Emergency Department (HOSPITAL_COMMUNITY)
Admission: EM | Admit: 2020-05-31 | Discharge: 2020-05-31 | Disposition: A | Discharge status: Home / Self Care | Payer: Medicaid Other | Attending: Emergency Medicine | Admitting: Emergency Medicine

## 2020-05-31 ENCOUNTER — Encounter (HOSPITAL_COMMUNITY): Payer: Self-pay | Admitting: Emergency Medicine

## 2020-05-31 ENCOUNTER — Emergency Department (HOSPITAL_BASED_OUTPATIENT_CLINIC_OR_DEPARTMENT_OTHER): Payer: Medicaid Other

## 2020-05-31 DIAGNOSIS — M79662 Pain in left lower leg: Secondary | ICD-10-CM | POA: Diagnosis not present

## 2020-05-31 DIAGNOSIS — Z21 Asymptomatic human immunodeficiency virus [HIV] infection status: Secondary | ICD-10-CM | POA: Insufficient documentation

## 2020-05-31 DIAGNOSIS — R200 Anesthesia of skin: Secondary | ICD-10-CM | POA: Insufficient documentation

## 2020-05-31 DIAGNOSIS — Z8616 Personal history of COVID-19: Secondary | ICD-10-CM | POA: Insufficient documentation

## 2020-05-31 DIAGNOSIS — Z79899 Other long term (current) drug therapy: Secondary | ICD-10-CM | POA: Diagnosis not present

## 2020-05-31 DIAGNOSIS — M79605 Pain in left leg: Secondary | ICD-10-CM | POA: Diagnosis not present

## 2020-05-31 DIAGNOSIS — M79604 Pain in right leg: Secondary | ICD-10-CM | POA: Insufficient documentation

## 2020-05-31 DIAGNOSIS — E119 Type 2 diabetes mellitus without complications: Secondary | ICD-10-CM | POA: Diagnosis not present

## 2020-05-31 DIAGNOSIS — R2243 Localized swelling, mass and lump, lower limb, bilateral: Secondary | ICD-10-CM | POA: Diagnosis not present

## 2020-05-31 DIAGNOSIS — Z7984 Long term (current) use of oral hypoglycemic drugs: Secondary | ICD-10-CM | POA: Insufficient documentation

## 2020-05-31 DIAGNOSIS — M79661 Pain in right lower leg: Secondary | ICD-10-CM | POA: Diagnosis not present

## 2020-05-31 LAB — COMPREHENSIVE METABOLIC PANEL
ALT: 15 U/L (ref 0–44)
AST: 20 U/L (ref 15–41)
Albumin: 3.2 g/dL — ABNORMAL LOW (ref 3.5–5.0)
Alkaline Phosphatase: 105 U/L (ref 38–126)
Anion gap: 10 (ref 5–15)
BUN: 5 mg/dL — ABNORMAL LOW (ref 6–20)
CO2: 24 mmol/L (ref 22–32)
Calcium: 9 mg/dL (ref 8.9–10.3)
Chloride: 102 mmol/L (ref 98–111)
Creatinine, Ser: 0.72 mg/dL (ref 0.44–1.00)
GFR, Estimated: >60 mL/min (ref >60)
Glucose, Bld: 338 mg/dL — ABNORMAL HIGH (ref 70–99)
Potassium: 3.9 mmol/L (ref 3.5–5.1)
Sodium: 136 mmol/L (ref 135–145)
Total Bilirubin: 0.6 mg/dL (ref 0.3–1.2)
Total Protein: 7.6 g/dL (ref 6.5–8.1)

## 2020-05-31 LAB — CBC WITH DIFFERENTIAL/PLATELET
Abs Immature Granulocytes: 0.01 10*3/uL (ref 0.00–0.07)
Basophils Absolute: 0 10*3/uL (ref 0.0–0.1)
Basophils Relative: 1 %
Eosinophils Absolute: 0 10*3/uL (ref 0.0–0.5)
Eosinophils Relative: 1 %
HCT: 33.6 % — ABNORMAL LOW (ref 36.0–46.0)
Hemoglobin: 9.5 g/dL — ABNORMAL LOW (ref 12.0–15.0)
Immature Granulocytes: 0 %
Lymphocytes Relative: 40 %
Lymphs Abs: 1.5 10*3/uL (ref 0.7–4.0)
MCH: 20.5 pg — ABNORMAL LOW (ref 26.0–34.0)
MCHC: 28.3 g/dL — ABNORMAL LOW (ref 30.0–36.0)
MCV: 72.4 fL — ABNORMAL LOW (ref 80.0–100.0)
Monocytes Absolute: 0.3 10*3/uL (ref 0.1–1.0)
Monocytes Relative: 7 %
Neutro Abs: 2 10*3/uL (ref 1.7–7.7)
Neutrophils Relative %: 51 %
Platelets: 191 10*3/uL (ref 150–400)
RBC: 4.64 MIL/uL (ref 3.87–5.11)
RDW: 14.4 % (ref 11.5–15.5)
WBC: 3.8 10*3/uL — ABNORMAL LOW (ref 4.0–10.5)
nRBC: 0 % (ref 0.0–0.2)

## 2020-05-31 LAB — CK: Total CK: 165 U/L (ref 38–234)

## 2020-05-31 MED ORDER — GABAPENTIN 300 MG PO CAPS
300.0000 mg | ORAL_CAPSULE | Freq: Once | ORAL | Status: AC
  Administered 2020-05-31: 300 mg via ORAL
  Filled 2020-05-31: qty 1

## 2020-05-31 NOTE — ED Notes (Signed)
E-signature pad unavailable at time of pt discharge. This RN discussed discharge materials with pt and answered all pt questions. Pt stated understanding of discharge material. ? ?

## 2020-05-31 NOTE — Social Work (Signed)
CSW met with Pt at bedside to assist with connecting Pt with PCP. Due to Pt's work schedule the earliest appointment available is November 8 @ 3:50 at [facility].  CSW informed Pt and added to AVS.

## 2020-05-31 NOTE — ED Triage Notes (Signed)
Pt reports bilateral leg "cramping" and swelling for the past few days, denies recent travel or long car rides. Ambulatory to triage with nad.

## 2020-05-31 NOTE — Progress Notes (Signed)
Bilateral lower extremity venous duplex completed. Refer to "CV Proc" under chart review to view preliminary results.  05/31/2020 5:23 PM Kelby Aline., MHA, RVT, RDCS, RDMS

## 2020-05-31 NOTE — ED Provider Notes (Signed)
[facility] Provider Note   CSN: [id number] Arrival date & time: 05/31/20  1148     History Chief Complaint  Patient presents with  . Leg Pain    LISSY DEUSER is a 44 y.o. female past medical history of, diabetes, HIV who presents with several days of leg pain.  Patient has had worsening intermittent crampy pain of her bilateral legs.  States it is worse when she is walking.  She is concerned about the blood flow to her legs.  She notes an area of hyperpigmentation that is new and she is concerned this is a blood clot.  Patient does not have a prior history of diabetic neuropathy and is not taking gabapentin.  No recent illnesses or travel.  Patient has not had any blood clot in the past.  Does endorse some feelings of numbness in her left foot and a sensation of radiation from the bottom of her feet up her legs.  No trauma.   Leg Pain Location:  Leg Injury: no   Leg location:  L leg and R leg Pain details:    Quality:  Cramping Chronicity:  New Dislocation: no   Relieved by:  None tried Ineffective treatments:  None tried Associated symptoms: numbness   Associated symptoms: no back pain, no fever and no swelling   Risk factors: no recent illness        Past Medical History:  Diagnosis Date  . Diabetes mellitus without complication ([place])   . Gallstones 03/10/2011  . HIV infection ([place])   . Immune deficiency disorder [facility])     Patient Active Problem List   Diagnosis Date Noted  . Hyperglycemia 06/16/2019  . History of 2019 novel coronavirus disease (COVID-19) 06/16/2019  . Dehydration 06/16/2019  . AKI (acute kidney injury) ([place]) 06/16/2019  . Candidal esophagitis ([place]) 06/16/2019  . Abnormal ECG 06/16/2019  . Acute respiratory disease due to COVID-19 virus 06/02/2019  . Medication monitoring encounter 11/12/2017  . Breast nodule 09/02/2017  . Screening examination for venereal disease 03/30/2017  . Encounter for long-term  (current) use of high-risk medication 03/30/2017  . Microalbuminuria due to type 2 diabetes mellitus ([place]) 03/30/2017  . Knee pain, chronic 03/30/2017  . Vitamin D deficiency 03/21/2017  . Bilateral leg cramps 01/22/2017  . Healthcare maintenance 03/02/2014  . Morbid obesity with BMI of 45.0-49.9, adult ([place]) 04/27/2012  . Uncontrolled type 2 diabetes mellitus ([place]) 12/08/2010  . Human immunodeficiency virus (HIV) disease ([place]) 09/29/2006    Past Surgical History:  Procedure Laterality Date  . CHOLECYSTECTOMY  06/29/2011   Procedure: LAPAROSCOPIC CHOLECYSTECTOMY WITH INTRAOPERATIVE CHOLANGIOGRAM;  Surgeon: Merrie Roof, MD;  Location: [facility];  Service: General;  Laterality: N/A;  . NO PAST SURGERIES       OB History   No obstetric history on file.     Family History  Problem Relation Age of Onset  . Healthy Mother   . Healthy Father     Social History   Tobacco Use  . Smoking status: Never Smoker  . Smokeless tobacco: Never Used  Vaping Use  . Vaping Use: Never used  Substance Use Topics  . Alcohol use: No  . Drug use: No    Home Medications Prior to Admission medications   Medication Sig Start Date End Date Taking? Authorizing Provider  benzonatate (TESSALON) 100 MG capsule Take 1 capsule (100 mg total) by mouth every 8 (eight) hours. Patient not taking: Reported on 05/27/2020 11/26/19  Henderly, Britni A, PA-C  cetirizine (ZYRTEC ALLERGY) 10 MG tablet Take 1 tablet (10 mg total) by mouth daily. Patient not taking: Reported on 05/27/2020 11/26/19   Henderly, Britni A, PA-C  cyclobenzaprine (FLEXERIL) 10 MG tablet Take 1 tablet (10 mg total) by mouth 2 (two) times daily as needed for muscle spasms. Patient not taking: Reported on 05/27/2020 02/25/20   Recardo Evangelist, PA-C  fluconazole (DIFLUCAN) 150 MG tablet Take 1 tablet (150 mg total) by mouth daily. Take 1 tablet, repeat in 3 days if symptoms persist Patient not taking: Reported on 05/27/2020 05/10/20   Darr,  Marguerita Beards, PA-C  fluticasone (FLONASE) 50 MCG/ACT nasal spray Place 1 spray into both nostrils daily. Patient not taking: Reported on 05/27/2020 11/26/19   Henderly, Britni A, PA-C  GENVOYA 150-150-200-10 MG TABS tablet TAKE 1 TABLET BY MOUTH DAILY WITH BREAKFAST. Patient taking differently: Take 1 tablet by mouth daily with breakfast.  09/02/18   Comer, Okey Regal, MD  glucose blood test strip Check your sugar in the morning before you eat breakfast, and one hour after a meal. 05/26/19   Melynda Ripple, MD  glucose monitoring kit (FREESTYLE) monitoring kit 1 each by Does not apply route daily. Check glucose once in the morning before breakfast and 1 hour after a meal 05/26/19   Melynda Ripple, MD  ibuprofen (ADVIL) 600 MG tablet Take 1 tablet (600 mg total) by mouth every 6 (six) hours as needed. Patient taking differently: Take 600 mg by mouth every 6 (six) hours as needed for mild pain.  11/04/19   Faustino Congress, NP  metFORMIN (GLUCOPHAGE) 500 MG tablet Take 2 tablets (1,000 mg total) by mouth 2 (two) times daily with a meal. 03/22/17 09/08/26  Ledell Noss, MD  methocarbamol (ROBAXIN) 500 MG tablet Take 1 tablet (500 mg total) by mouth 2 (two) times daily. Patient not taking: Reported on 05/27/2020 11/04/19   Faustino Congress, NP  metroNIDAZOLE (FLAGYL) 500 MG tablet Take 1 tablet (500 mg total) by mouth 2 (two) times daily. Patient not taking: Reported on 05/27/2020 05/14/20   Chase Picket, MD  nitrofurantoin, macrocrystal-monohydrate, (MACROBID) 100 MG capsule Take 1 capsule (100 mg total) by mouth 2 (two) times daily. Patient not taking: Reported on 05/27/2020 05/14/20   Chase Picket, MD  sitaGLIPtin (JANUVIA) 100 MG tablet Take 1 tablet (100 mg total) by mouth daily. 08/05/17   Marcelyn Bruins, MD    Allergies    Patient has no known allergies.  Review of Systems   Review of Systems  Constitutional: Negative for chills and fever.  HENT: Negative for ear pain and sore  throat.   Eyes: Negative for pain and visual disturbance.  Respiratory: Negative for cough and shortness of breath.   Cardiovascular: Negative for chest pain and palpitations.  Gastrointestinal: Negative for abdominal pain and vomiting.  Genitourinary: Negative for dysuria and hematuria.  Musculoskeletal: Negative for arthralgias and back pain.  Skin: Negative for color change and rash.  Neurological: Negative for seizures and syncope.  All other systems reviewed and are negative.   Physical Exam Updated Vital Signs BP (!) 133/95 (BP Location: Left Arm)   Pulse 84   Temp 98.8 F (37.1 C) (Oral)   Resp 18   Ht 5' 7" (1.702 m)   Wt 115.7 kg   SpO2 100%   BMI 39.94 kg/m   Physical Exam Vitals and nursing note reviewed.  Constitutional:      General: She is not in acute  distress.    Appearance: She is well-developed.  HENT:     Head: Normocephalic and atraumatic.  Eyes:     Conjunctiva/sclera: Conjunctivae normal.  Cardiovascular:     Rate and Rhythm: Normal rate and regular rhythm.     Heart sounds: No murmur heard.   Pulmonary:     Effort: Pulmonary effort is normal. No respiratory distress.     Breath sounds: Normal breath sounds.  Abdominal:     Palpations: Abdomen is soft.     Tenderness: There is no abdominal tenderness.  Musculoskeletal:        General: Tenderness (BLE) present. No swelling.     Cervical back: Neck supple.  Skin:    General: Skin is warm and dry.     Findings: No rash.  Neurological:     Mental Status: She is alert and oriented to person, place, and time.     ED Results / Procedures / Treatments   Labs (all labs ordered are listed, but only abnormal results are displayed) Labs Reviewed  COMPREHENSIVE METABOLIC PANEL - Abnormal; Notable for the following components:      Result Value   Glucose, Bld 338 (*)    BUN 5 (*)    Albumin 3.2 (*)    All other components within normal limits  CBC WITH DIFFERENTIAL/PLATELET - Abnormal; Notable  for the following components:   WBC 3.8 (*)    Hemoglobin 9.5 (*)    HCT 33.6 (*)    MCV 72.4 (*)    MCH 20.5 (*)    MCHC 28.3 (*)    All other components within normal limits  CK    EKG None  Radiology VAS [place] LOWER EXTREMITY VENOUS (DVT) (ONLY [facility] & [facility])  Result Date: 05/31/2020  Lower Venous DVTStudy Indications: Bilateral leg cramping x2 days.  Limitations: Poor ultrasound/tissue interface. Comparison Study: No prior study Performing Technologist: Maudry Mayhew MHA, RDMS, RVT, RDCS  Examination Guidelines: A complete evaluation includes B-mode imaging, spectral Doppler, color Doppler, and power Doppler as needed of all accessible portions of each vessel. Bilateral testing is considered an integral part of a complete examination. Limited examinations for reoccurring indications may be performed as noted. The reflux portion of the exam is performed with the patient in reverse Trendelenburg.  +---------+---------------+---------+-----------+----------+--------------+ RIGHT    CompressibilityPhasicitySpontaneityPropertiesThrombus Aging +---------+---------------+---------+-----------+----------+--------------+ CFV      Full           Yes      Yes                                 +---------+---------------+---------+-----------+----------+--------------+ SFJ      Full                                                        +---------+---------------+---------+-----------+----------+--------------+ FV Prox  Full                                                        +---------+---------------+---------+-----------+----------+--------------+ FV Mid   Full                                                        +---------+---------------+---------+-----------+----------+--------------+  FV DistalFull                                                        +---------+---------------+---------+-----------+----------+--------------+ PFV      Full                                                         +---------+---------------+---------+-----------+----------+--------------+ POP      Full           Yes      Yes                                 +---------+---------------+---------+-----------+----------+--------------+ PTV      Full                                                        +---------+---------------+---------+-----------+----------+--------------+ PERO     Full                                                        +---------+---------------+---------+-----------+----------+--------------+   +---------+---------------+---------+-----------+----------+--------------+ LEFT     CompressibilityPhasicitySpontaneityPropertiesThrombus Aging +---------+---------------+---------+-----------+----------+--------------+ CFV      Full           Yes      Yes                                 +---------+---------------+---------+-----------+----------+--------------+ SFJ      Full                                                        +---------+---------------+---------+-----------+----------+--------------+ FV Prox  Full                                                        +---------+---------------+---------+-----------+----------+--------------+ FV Mid   Full                                                        +---------+---------------+---------+-----------+----------+--------------+ FV DistalFull                                                        +---------+---------------+---------+-----------+----------+--------------+  PFV      Full                                                        +---------+---------------+---------+-----------+----------+--------------+ POP      Full           Yes      Yes                                 +---------+---------------+---------+-----------+----------+--------------+ PTV      Full                                                         +---------+---------------+---------+-----------+----------+--------------+ PERO     Full                                                        +---------+---------------+---------+-----------+----------+--------------+     Summary: RIGHT: - There is no evidence of deep vein thrombosis in the lower extremity.  - No cystic structure found in the popliteal fossa.  LEFT: - There is no evidence of deep vein thrombosis in the lower extremity.  - No cystic structure found in the popliteal fossa.  *See table(s) above for measurements and observations. Electronically signed by Monica Martinez MD on 05/31/2020 at 7:10:18 PM.    Final     Procedures Procedures (including critical care time)  Medications Ordered in ED Medications  gabapentin (NEURONTIN) capsule 300 mg (300 mg Oral Given 05/31/20 1616)    ED Course  I have reviewed the triage vital signs and the nursing notes.  Pertinent labs & imaging results that were available during my care of the patient were reviewed by me and considered in my medical decision making (see chart for details).    MDM Rules/Calculators/A&P                         DVT study negative bilaterally.  Patient is not hypokalemic.  CK within normal limits, no concern for myositis.  Not cellulitic.  Patient given gabapentin, but this did not help.  Less likely to be diabetic neuropathy.  Patient does not have a PCP, so social work consulted.  Patient was set up for an appointment.  She was instructed to call her infectious disease physician as well to discuss her symptoms, as he may want to adjust her medications if this is a potential side effect.  Patient verbalized understanding and agreement with this plan.    Etiology of leg pain unknown, but I do not believe it to be emergent based on the work-up above.  Patient is stable for discharge at this time.  This patient was seen with Dr. Kathrynn Humble. Final Clinical Impression(s) / ED Diagnoses Final diagnoses:    Pain in both lower extremities    Rx / DC Orders ED Discharge Orders    None       Asencion Noble,  MD 05/31/20 [place], Ankit, MD 06/02/20 [phone]

## 2020-05-31 NOTE — Discharge Instructions (Addendum)
We did not find any blood clots, electrolyte imbalances, or other emergent cause of your leg pain. Please go to your primary care appointment on 11/8 and call Dr. Linus Salmons to discuss if this may be a medication side effect.

## 2020-06-03 ENCOUNTER — Telehealth: Payer: Self-pay

## 2020-06-03 NOTE — Telephone Encounter (Signed)
Transition Care Management Unsuccessful Follow-up Telephone Call  Date of discharge and from where[facility]  Attempts:  1st Attempt  Reason for unsuccessful TCM follow-up call:  Left voice message

## 2020-06-18 ENCOUNTER — Other Ambulatory Visit: Payer: Self-pay

## 2020-06-18 ENCOUNTER — Encounter (HOSPITAL_COMMUNITY): Payer: Self-pay | Admitting: Emergency Medicine

## 2020-06-18 ENCOUNTER — Emergency Department (HOSPITAL_COMMUNITY): Payer: Medicaid Other

## 2020-06-18 ENCOUNTER — Emergency Department (HOSPITAL_COMMUNITY)
Admission: EM | Admit: 2020-06-18 | Discharge: 2020-06-19 | Disposition: A | Discharge status: Home / Self Care | Payer: Medicaid Other | Attending: Emergency Medicine | Admitting: Emergency Medicine

## 2020-06-18 DIAGNOSIS — Z7984 Long term (current) use of oral hypoglycemic drugs: Secondary | ICD-10-CM | POA: Diagnosis not present

## 2020-06-18 DIAGNOSIS — Z21 Asymptomatic human immunodeficiency virus [HIV] infection status: Secondary | ICD-10-CM | POA: Diagnosis not present

## 2020-06-18 DIAGNOSIS — E1165 Type 2 diabetes mellitus with hyperglycemia: Secondary | ICD-10-CM | POA: Diagnosis not present

## 2020-06-18 DIAGNOSIS — Z79899 Other long term (current) drug therapy: Secondary | ICD-10-CM | POA: Insufficient documentation

## 2020-06-18 DIAGNOSIS — E1129 Type 2 diabetes mellitus with other diabetic kidney complication: Secondary | ICD-10-CM | POA: Insufficient documentation

## 2020-06-18 DIAGNOSIS — Z8616 Personal history of COVID-19: Secondary | ICD-10-CM | POA: Insufficient documentation

## 2020-06-18 DIAGNOSIS — R072 Precordial pain: Secondary | ICD-10-CM | POA: Insufficient documentation

## 2020-06-18 DIAGNOSIS — R457 State of emotional shock and stress, unspecified: Secondary | ICD-10-CM | POA: Diagnosis not present

## 2020-06-18 DIAGNOSIS — R079 Chest pain, unspecified: Secondary | ICD-10-CM

## 2020-06-18 DIAGNOSIS — R0789 Other chest pain: Secondary | ICD-10-CM | POA: Diagnosis not present

## 2020-06-18 DIAGNOSIS — R109 Unspecified abdominal pain: Secondary | ICD-10-CM | POA: Insufficient documentation

## 2020-06-18 LAB — BASIC METABOLIC PANEL
Anion gap: 9 (ref 5–15)
BUN: 8 mg/dL (ref 6–20)
CO2: 22 mmol/L (ref 22–32)
Calcium: 9.1 mg/dL (ref 8.9–10.3)
Chloride: 102 mmol/L (ref 98–111)
Creatinine, Ser: 0.64 mg/dL (ref 0.44–1.00)
GFR, Estimated: >60 mL/min (ref >60)
Glucose, Bld: 346 mg/dL — ABNORMAL HIGH (ref 70–99)
Potassium: 3.5 mmol/L (ref 3.5–5.1)
Sodium: 133 mmol/L — ABNORMAL LOW (ref 135–145)

## 2020-06-18 LAB — TROPONIN I (HIGH SENSITIVITY)
Troponin I (High Sensitivity): 3 ng/L (ref <18)
Troponin I (High Sensitivity): <2 ng/L (ref <18)

## 2020-06-18 LAB — I-STAT BETA HCG BLOOD, ED (MC, WL, AP ONLY): I-stat hCG, quantitative: <5 m[IU]/mL (ref <5)

## 2020-06-18 LAB — CBC
HCT: 35.6 % — ABNORMAL LOW (ref 36.0–46.0)
Hemoglobin: 10 g/dL — ABNORMAL LOW (ref 12.0–15.0)
MCH: 20.2 pg — ABNORMAL LOW (ref 26.0–34.0)
MCHC: 28.1 g/dL — ABNORMAL LOW (ref 30.0–36.0)
MCV: 71.9 fL — ABNORMAL LOW (ref 80.0–100.0)
Platelets: 219 10*3/uL (ref 150–400)
RBC: 4.95 MIL/uL (ref 3.87–5.11)
RDW: 14.6 % (ref 11.5–15.5)
WBC: 4.9 10*3/uL (ref 4.0–10.5)
nRBC: 0 % (ref 0.0–0.2)

## 2020-06-18 LAB — PROTIME-INR
INR: 1.1 (ref 0.8–1.2)
Prothrombin Time: 14.1 seconds (ref 11.4–15.2)

## 2020-06-18 NOTE — ED Triage Notes (Signed)
Patient reports central chest pain after ingesting edible marijuana this evening with SOB , no emesis or diaphoresis . Denies fever or chills , no cough or fever .

## 2020-06-19 MED ORDER — ALUM & MAG HYDROXIDE-SIMETH 200-200-20 MG/5ML PO SUSP
30.0000 mL | Freq: Once | ORAL | Status: AC
  Administered 2020-06-19: 30 mL via ORAL
  Filled 2020-06-19: qty 30

## 2020-06-19 MED ORDER — DICYCLOMINE HCL 10 MG/5ML PO SOLN
10.0000 mg | Freq: Once | ORAL | Status: AC
  Administered 2020-06-19: 10 mg via ORAL
  Filled 2020-06-19: qty 5

## 2020-06-19 NOTE — ED Provider Notes (Signed)
[place] EMERGENCY DEPARTMENT Provider Note   CSN: [id number] Arrival date & time: 06/18/20  1925     History Chief Complaint  Patient presents with  . Chest Pain    Beverly Johnson is a 44 y.o. female.  44 yo F with a chief complaint of chest pain.  This is in the center of her chest and feels sharp.  Started about lunchtime.  Nothing seems to make it better or worse.  Occurred after she had lunch.  Denies radiation of her pain.  Denies shortness of breath denies diaphoresis denies nausea or vomiting.  She does have some right lower abdominal tenderness with this as well.  She denies fevers.  Denies urinary symptoms.  Denies cough congestion.  Patient denies history of MI, denies hypertension hyperlipidemia smoking. +hx of DM.  Denies family history of MI.  Patient denies history of PE or DVT denies hemoptysis denies unilateral lower extremity edema denies recent surgery immobilization hospitalization estrogen use or history of cancer.   The history is provided by the patient.  Chest Pain Pain location:  Substernal area Pain quality: sharp   Pain radiates to:  Does not radiate Pain severity:  Moderate Onset quality:  Gradual Duration:  12 hours Timing:  Constant Progression:  Unchanged Chronicity:  New Relieved by:  Nothing Worsened by:  Nothing Ineffective treatments:  None tried Associated symptoms: abdominal pain   Associated symptoms: no cough, no dizziness, no fever, no headache, no nausea, no palpitations, no shortness of breath and no vomiting        Past Medical History:  Diagnosis Date  . Diabetes mellitus without complication ([place])   . Gallstones 03/10/2011  . HIV infection ([place])   . Immune deficiency disorder [facility])     Patient Active Problem List   Diagnosis Date Noted  . Hyperglycemia 06/16/2019  . History of 2019 novel coronavirus disease (COVID-19) 06/16/2019  . Dehydration 06/16/2019  . AKI (acute kidney injury) ([place])  06/16/2019  . Candidal esophagitis ([place]) 06/16/2019  . Abnormal ECG 06/16/2019  . Acute respiratory disease due to COVID-19 virus 06/02/2019  . Medication monitoring encounter 11/12/2017  . Breast nodule 09/02/2017  . Screening examination for venereal disease 03/30/2017  . Encounter for long-term (current) use of high-risk medication 03/30/2017  . Microalbuminuria due to type 2 diabetes mellitus ([place]) 03/30/2017  . Knee pain, chronic 03/30/2017  . Vitamin D deficiency 03/21/2017  . Bilateral leg cramps 01/22/2017  . Healthcare maintenance 03/02/2014  . Morbid obesity with BMI of 45.0-49.9, adult ([place]) 04/27/2012  . Uncontrolled type 2 diabetes mellitus ([place]) 12/08/2010  . Human immunodeficiency virus (HIV) disease ([place]) 09/29/2006    Past Surgical History:  Procedure Laterality Date  . CHOLECYSTECTOMY  06/29/2011   Procedure: LAPAROSCOPIC CHOLECYSTECTOMY WITH INTRAOPERATIVE CHOLANGIOGRAM;  Surgeon: Merrie Roof, MD;  Location: [facility];  Service: General;  Laterality: N/A;  . NO PAST SURGERIES       OB History   No obstetric history on file.     Family History  Problem Relation Age of Onset  . Healthy Mother   . Healthy Father     Social History   Tobacco Use  . Smoking status: Never Smoker  . Smokeless tobacco: Never Used  Vaping Use  . Vaping Use: Never used  Substance Use Topics  . Alcohol use: No  . Drug use: Yes    Types: Marijuana    Comment: CBD    Home Medications Prior to Admission medications  Medication Sig Start Date End Date Taking? Authorizing Provider  GENVOYA 150-150-200-10 MG TABS tablet TAKE 1 TABLET BY MOUTH DAILY WITH BREAKFAST. Patient taking differently: Take 1 tablet by mouth daily with breakfast.  09/02/18  Yes Comer, Okey Regal, MD  glucose blood test strip Check your sugar in the morning before you eat breakfast, and one hour after a meal. 05/26/19  Yes Melynda Ripple, MD  glucose monitoring kit (FREESTYLE) monitoring kit 1 each by  Does not apply route daily. Check glucose once in the morning before breakfast and 1 hour after a meal 05/26/19  Yes Melynda Ripple, MD  metFORMIN (GLUCOPHAGE) 500 MG tablet Take 2 tablets (1,000 mg total) by mouth 2 (two) times daily with a meal. 03/22/17 09/08/26 Yes Ledell Noss, MD  sitaGLIPtin (JANUVIA) 100 MG tablet Take 1 tablet (100 mg total) by mouth daily. 08/05/17  Yes Marcelyn Bruins, MD  benzonatate (TESSALON) 100 MG capsule Take 1 capsule (100 mg total) by mouth every 8 (eight) hours. Patient not taking: Reported on 05/27/2020 11/26/19   Henderly, Britni A, PA-C  cetirizine (ZYRTEC ALLERGY) 10 MG tablet Take 1 tablet (10 mg total) by mouth daily. Patient not taking: Reported on 05/27/2020 11/26/19   Henderly, Britni A, PA-C  cyclobenzaprine (FLEXERIL) 10 MG tablet Take 1 tablet (10 mg total) by mouth 2 (two) times daily as needed for muscle spasms. Patient not taking: Reported on 05/27/2020 02/25/20   Recardo Evangelist, PA-C  fluconazole (DIFLUCAN) 150 MG tablet Take 1 tablet (150 mg total) by mouth daily. Take 1 tablet, repeat in 3 days if symptoms persist Patient not taking: Reported on 05/27/2020 05/10/20   Darr, Edison Nasuti, PA-C  fluticasone [facility]) 50 MCG/ACT nasal spray Place 1 spray into both nostrils daily. Patient not taking: Reported on 05/27/2020 11/26/19   Henderly, Britni A, PA-C  ibuprofen (ADVIL) 600 MG tablet Take 1 tablet (600 mg total) by mouth every 6 (six) hours as needed. Patient not taking: Reported on 06/18/2020 11/04/19   Faustino Congress, NP  methocarbamol (ROBAXIN) 500 MG tablet Take 1 tablet (500 mg total) by mouth 2 (two) times daily. Patient not taking: Reported on 05/27/2020 11/04/19   Faustino Congress, NP  metroNIDAZOLE (FLAGYL) 500 MG tablet Take 1 tablet (500 mg total) by mouth 2 (two) times daily. Patient not taking: Reported on 05/27/2020 05/14/20   Chase Picket, MD    Allergies    Patient has no known allergies.  Review of Systems   Review  of Systems  Constitutional: Negative for chills and fever.  HENT: Negative for congestion and rhinorrhea.   Eyes: Negative for redness and visual disturbance.  Respiratory: Negative for cough, shortness of breath and wheezing.   Cardiovascular: Positive for chest pain. Negative for palpitations.  Gastrointestinal: Positive for abdominal pain. Negative for diarrhea, nausea and vomiting.  Genitourinary: Negative for dysuria and urgency.  Musculoskeletal: Negative for arthralgias and myalgias.  Skin: Negative for pallor and wound.  Neurological: Negative for dizziness and headaches.    Physical Exam Updated Vital Signs BP 101/72 (BP Location: Right Arm)   Pulse 77   Temp 98.2 F (36.8 C) (Oral)   Resp 18   SpO2 98%   Physical Exam Vitals and nursing note reviewed.  Constitutional:      General: She is not in acute distress.    Appearance: She is well-developed. She is obese. She is not diaphoretic.  HENT:     Head: Normocephalic and atraumatic.  Eyes:     Pupils:  Pupils are equal, round, and reactive to light.  Cardiovascular:     Rate and Rhythm: Normal rate and regular rhythm.     Heart sounds: No murmur heard.  No friction rub. No gallop.   Pulmonary:     Effort: Pulmonary effort is normal.     Breath sounds: No wheezing or rales.  Chest:     Comments: Patient seems uncomfortable when I push on the sternum and the musculature adjacent though she tells me it does not hurt. Abdominal:     General: There is no distension.     Palpations: Abdomen is soft.     Tenderness: There is no abdominal tenderness.     Comments: Benign abdominal exam  Musculoskeletal:        General: No tenderness.     Cervical back: Normal range of motion and neck supple.  Skin:    General: Skin is warm and dry.  Neurological:     Mental Status: She is alert and oriented to person, place, and time.  Psychiatric:        Behavior: Behavior normal.     ED Results / Procedures / Treatments     Labs (all labs ordered are listed, but only abnormal results are displayed) Labs Reviewed  BASIC METABOLIC PANEL - Abnormal; Notable for the following components:      Result Value   Sodium 133 (*)    Glucose, Bld 346 (*)    All other components within normal limits  CBC - Abnormal; Notable for the following components:   Hemoglobin 10.0 (*)    HCT 35.6 (*)    MCV 71.9 (*)    MCH 20.2 (*)    MCHC 28.1 (*)    All other components within normal limits  PROTIME-INR  I-STAT BETA HCG BLOOD, ED ([facility], [facility], [facility] ONLY)  TROPONIN I (HIGH SENSITIVITY)  TROPONIN I (HIGH SENSITIVITY)    EKG EKG Interpretation  Date/Time:  Tuesday June 18 2020 20:20:35 EDT Ventricular Rate:  85 PR Interval:  176 QRS Duration: 74 QT Interval:  358 QTC Calculation: 426 R Axis:   34 Text Interpretation: Normal sinus rhythm T wave abnormality, consider inferior ischemia Abnormal ECG flipped t waves in inferior and lateral leads not seen on prior Otherwise no significant change Confirmed by Deno Etienne [phone]) on 06/18/2020 11:51:23 PM   Radiology DG Chest 2 View  Result Date: 06/18/2020 CLINICAL DATA:  Chest pain after ingesting at edible marijuana with shortness of breath. EXAM: CHEST - 2 VIEW COMPARISON:  Chest x-ray 02/25/2020 FINDINGS: The heart size and mediastinal contours are within normal limits. Low lung volumes with no consolidation. No pulmonary edema. No pleural effusion. No pneumothorax. No acute osseous abnormality. IMPRESSION: No active cardiopulmonary disease. Electronically Signed   By: Iven Finn M.D.   On: 06/18/2020 21:00    Procedures Procedures (including critical care time)  Medications Ordered in ED Medications  alum & mag hydroxide-simeth (MAALOX/MYLANTA) 200-200-20 MG/5ML suspension 30 mL (has no administration in time range)  dicyclomine (BENTYL) 10 MG/5ML solution 10 mg (has no administration in time range)    ED Course  I have reviewed the triage vital signs and the  nursing notes.  Pertinent labs & imaging results that were available during my care of the patient were reviewed by me and considered in my medical decision making (see chart for details).    MDM Rules/Calculators/A&P  44 yo F with a chief complaint of chest pain.  This is atypical in nature.  Going on for about 12 hours.  Occurred after eating.  Described as sharp.  No exertional symptoms no shortness of breath.  EKG with nonspecific ST changes in the lateral leads.  Delta troponin is negative.  Chest x-ray viewed by me without focal left-sided pneumothorax.  Patient's history is atypical of ACS.  With 2 negative troponins I feel no further work-up is needed in the ED.  Her symptoms had onset after eating.  Most likely this is reflux by history.  We will treat as such.  She is PERC negative.  She was complaining of some right lower abdominal tenderness though has no pain on exam.  Tolerating p.o. in the ED without issue.  PCP follow-up.  12:16 AM:  I have discussed the diagnosis/risks/treatment options with the patient and believe the pt to be eligible for discharge home to follow-up with PCP. We also discussed returning to the ED immediately if new or worsening sx occur. We discussed the sx which are most concerning (e.g., sudden worsening pain, fever, inability to tolerate by mouth) that necessitate immediate return. Medications administered to the patient during their visit and any new prescriptions provided to the patient are listed below.  Medications given during this visit Medications  alum & mag hydroxide-simeth (MAALOX/MYLANTA) 200-200-20 MG/5ML suspension 30 mL (has no administration in time range)  dicyclomine (BENTYL) 10 MG/5ML solution 10 mg (has no administration in time range)     The patient appears reasonably screen and/or stabilized for discharge and I doubt any other medical condition or other [facility] requiring further screening, evaluation, or treatment  in the ED at this time prior to discharge.    Final Clinical Impression(s) / ED Diagnoses Final diagnoses:  Nonspecific chest pain    Rx / DC Orders ED Discharge Orders    None       Deno Etienne, DO 06/19/20 0016

## 2020-06-19 NOTE — Discharge Instructions (Signed)
Try pepcid or tagamet up to twice a day.  Try to avoid things that may make this worse, most commonly these are spicy foods tomato based products fatty foods chocolate and peppermint.  Alcohol and tobacco can also make this worse.  Return to the emergency department for sudden worsening pain fever or inability to eat or drink.  

## 2020-06-24 ENCOUNTER — Inpatient Hospital Stay: Payer: Medicaid Other | Admitting: Family Medicine

## 2020-09-06 ENCOUNTER — Other Ambulatory Visit: Payer: Self-pay

## 2020-09-06 ENCOUNTER — Ambulatory Visit (HOSPITAL_COMMUNITY)
Admission: EM | Admit: 2020-09-06 | Discharge: 2020-09-06 | Disposition: A | Discharge status: Home / Self Care | Payer: Medicaid Other | Attending: Family Medicine | Admitting: Family Medicine

## 2020-09-06 ENCOUNTER — Encounter (HOSPITAL_COMMUNITY): Payer: Self-pay

## 2020-09-06 DIAGNOSIS — B349 Viral infection, unspecified: Secondary | ICD-10-CM | POA: Insufficient documentation

## 2020-09-06 DIAGNOSIS — Z20822 Contact with and (suspected) exposure to covid-19: Secondary | ICD-10-CM | POA: Diagnosis not present

## 2020-09-06 DIAGNOSIS — U071 COVID-19: Secondary | ICD-10-CM | POA: Diagnosis not present

## 2020-09-06 LAB — SARS CORONAVIRUS 2 (TAT 6-24 HRS): SARS Coronavirus 2: POSITIVE — AB

## 2020-09-06 NOTE — ED Provider Notes (Signed)
[place]    CSN: [id number] Arrival date & time: 09/06/20  2263      History   Chief Complaint Chief Complaint  Patient presents with  . Generalized Body Aches  . Nasal Congestion  . Cough    HPI Beverly Johnson is a 45 y.o. female.   HPI  Patient presents with URI symptoms including cough, generalized bodyaches.  Reports a close exposure to COVID at work.  Symptoms present x2 days.  She is taking over-the-counter cough medicine for symptoms.  She has not taken any medication for body aches.  Endorses shortness of breath only with cyclic type of coughing.  Denies worrisome symptoms of shortness of breath, weakness, N&V, chest pain..   Past Medical History:  Diagnosis Date  . Diabetes mellitus without complication ([place])   . Gallstones 03/10/2011  . HIV infection ([place])   . Immune deficiency disorder [facility])     Patient Active Problem List   Diagnosis Date Noted  . Hyperglycemia 06/16/2019  . History of 2019 novel coronavirus disease (COVID-19) 06/16/2019  . Dehydration 06/16/2019  . AKI (acute kidney injury) ([place]) 06/16/2019  . Candidal esophagitis ([place]) 06/16/2019  . Abnormal ECG 06/16/2019  . Acute respiratory disease due to COVID-19 virus 06/02/2019  . Medication monitoring encounter 11/12/2017  . Breast nodule 09/02/2017  . Screening examination for venereal disease 03/30/2017  . Encounter for long-term (current) use of high-risk medication 03/30/2017  . Microalbuminuria due to type 2 diabetes mellitus ([place]) 03/30/2017  . Knee pain, chronic 03/30/2017  . Vitamin D deficiency 03/21/2017  . Bilateral leg cramps 01/22/2017  . Healthcare maintenance 03/02/2014  . Morbid obesity with BMI of 45.0-49.9, adult ([place]) 04/27/2012  . Uncontrolled type 2 diabetes mellitus ([place]) 12/08/2010  . Human immunodeficiency virus (HIV) disease ([place]) 09/29/2006    Past Surgical History:  Procedure Laterality Date  . CHOLECYSTECTOMY  06/29/2011   Procedure: LAPAROSCOPIC  CHOLECYSTECTOMY WITH INTRAOPERATIVE CHOLANGIOGRAM;  Surgeon: Merrie Roof, MD;  Location: [facility];  Service: General;  Laterality: N/A;  . NO PAST SURGERIES      OB History   No obstetric history on file.      Home Medications    Prior to Admission medications   Medication Sig Start Date End Date Taking? Authorizing Provider  benzonatate (TESSALON) 100 MG capsule Take 1 capsule (100 mg total) by mouth every 8 (eight) hours. Patient not taking: Reported on 05/27/2020 11/26/19   Henderly, Britni A, PA-C  cetirizine (ZYRTEC ALLERGY) 10 MG tablet Take 1 tablet (10 mg total) by mouth daily. Patient not taking: Reported on 05/27/2020 11/26/19   Henderly, Britni A, PA-C  cyclobenzaprine (FLEXERIL) 10 MG tablet Take 1 tablet (10 mg total) by mouth 2 (two) times daily as needed for muscle spasms. Patient not taking: Reported on 05/27/2020 02/25/20   Recardo Evangelist, PA-C  fluconazole (DIFLUCAN) 150 MG tablet Take 1 tablet (150 mg total) by mouth daily. Take 1 tablet, repeat in 3 days if symptoms persist Patient not taking: Reported on 05/27/2020 05/10/20   Darr, Edison Nasuti, PA-C  fluticasone [facility]) 50 MCG/ACT nasal spray Place 1 spray into both nostrils daily. Patient not taking: Reported on 05/27/2020 11/26/19   Henderly, Britni A, PA-C  GENVOYA 150-150-200-10 MG TABS tablet TAKE 1 TABLET BY MOUTH DAILY WITH BREAKFAST. Patient taking differently: Take 1 tablet by mouth daily with breakfast.  09/02/18   Comer, Okey Regal, MD  glucose blood test strip Check your sugar in the morning before you  eat breakfast, and one hour after a meal. 05/26/19   Melynda Ripple, MD  glucose monitoring kit (FREESTYLE) monitoring kit 1 each by Does not apply route daily. Check glucose once in the morning before breakfast and 1 hour after a meal 05/26/19   Melynda Ripple, MD  ibuprofen (ADVIL) 600 MG tablet Take 1 tablet (600 mg total) by mouth every 6 (six) hours as needed. Patient not taking: Reported on 06/18/2020  11/04/19   Faustino Congress, NP  metFORMIN (GLUCOPHAGE) 500 MG tablet Take 2 tablets (1,000 mg total) by mouth 2 (two) times daily with a meal. 03/22/17 09/08/26  Ledell Noss, MD  methocarbamol (ROBAXIN) 500 MG tablet Take 1 tablet (500 mg total) by mouth 2 (two) times daily. Patient not taking: Reported on 05/27/2020 11/04/19   Faustino Congress, NP  metroNIDAZOLE (FLAGYL) 500 MG tablet Take 1 tablet (500 mg total) by mouth 2 (two) times daily. Patient not taking: Reported on 05/27/2020 05/14/20   Chase Picket, MD  sitaGLIPtin (JANUVIA) 100 MG tablet Take 1 tablet (100 mg total) by mouth daily. 08/05/17   Marcelyn Bruins, MD    Family History Family History  Problem Relation Age of Onset  . Healthy Mother   . Healthy Father     Social History Social History   Tobacco Use  . Smoking status: Never Smoker  . Smokeless tobacco: Never Used  Vaping Use  . Vaping Use: Never used  Substance Use Topics  . Alcohol use: No  . Drug use: Yes    Types: Marijuana    Comment: CBD     Allergies   Patient has no known allergies.   Review of Systems Review of Systems Pertinent negatives listed in HPI  Physical Exam Triage Vital Signs ED Triage Vitals  Enc Vitals Group     BP 09/06/20 0927 131/83     Pulse Rate 09/06/20 0927 98     Resp 09/06/20 0927 19     Temp 09/06/20 0932 98.6 F (37 C)     Temp src --      SpO2 09/06/20 0927 98 %     Weight --      Height --      Head Circumference --      Peak Flow --      Pain Score 09/06/20 0931 7     Pain Loc --      Pain Edu? --      Excl. in [place]? --    No data found.  Updated Vital Signs BP 131/83   Pulse 98   Temp 98.6 F (37 C)   Resp 19   LMP 08/22/2020 (Approximate)   SpO2 98%   Visual Acuity Right Eye Distance:   Left Eye Distance:   Bilateral Distance:    Right Eye Near:   Left Eye Near:    Bilateral Near:     Physical Exam General appearance: alert, Ill-appearing, no distress Head: Normocephalic,  without obvious abnormality, atraumatic ENT: External ears normal, bilateral nares patent, oral pharynx Respiratory: Respirations even , unlabored, no adventitious  lung sounds  Heart: rate and rhythm normal. No gallop or murmurs noted on exam  Abdomen: BS +, no distention, no rebound tenderness, or no mass Extremities: No gross deformities Skin: Skin color, texture, turgor normal. No rashes seen  Psych: Appropriate mood and affect. Neurologic:  Alert, oriented to person, place, and time, thought content appropriate.   UC Treatments / Results  Labs (all labs ordered are listed,  but only abnormal results are displayed) Labs Reviewed  SARS CORONAVIRUS 2 (TAT 6-24 HRS)    EKG   Radiology No results found.  Procedures Procedures (including critical care time)  Medications Ordered in UC Medications - No data to display  Initial Impression / Assessment and Plan / UC Course  I have reviewed the triage vital signs and the nursing notes.  Pertinent labs & imaging results that were available during my care of the patient were reviewed by me and considered in my medical decision making (see chart for details).      COVID test pending. Symptom management warranted only.  Manage fever with Tylenol and ibuprofen.  Nasal symptoms with over-the-counter antihistamines recommended.  Treatment per discharge medications/discharge instructions.  Red flags/ER precautions given. The most current CDC isolation/quarantine recommendation advised.  Final Clinical Impressions(s) / UC Diagnoses   Final diagnoses:  Exposure to COVID-19 virus  Viral illness     Discharge Instructions     Your COVID 19 results will be available in 24-48 hours. Negative results are immediately resulted to Mychart. Positive results will receive identification message via MyChart.  Given that you are symptomatic recommend quarantining until your test results. Take over-the-counter Delsym for management of cough  ibuprofen or naproxen for management of body aches.  If you develop any severe shortness of breath or severe chest pressure or chest pain go immediately to the emergency department.   ED Prescriptions    None     PDMP not reviewed this encounter.   Scot Jun, FNP 09/06/20 1058

## 2020-09-06 NOTE — ED Triage Notes (Signed)
Pt in with c/o body aches, runny nose, and cough that has been going on for 2 days now  Pt states she has possibly been exposed to covid from an employee at her job   Pt has been taking cough medicine for sxs

## 2020-09-06 NOTE — Discharge Instructions (Addendum)
Your COVID 19 results will be available in 24-48 hours. Negative results are immediately resulted to Mychart. Positive results will receive identification message via MyChart.  Given that you are symptomatic recommend quarantining until your test results. Take over-the-counter Delsym for management of cough ibuprofen or naproxen for management of body aches.  If you develop any severe shortness of breath or severe chest pressure or chest pain go immediately to the emergency department.

## 2020-09-08 ENCOUNTER — Encounter: Payer: Self-pay | Admitting: Unknown Physician Specialty

## 2020-09-08 ENCOUNTER — Telehealth: Payer: Self-pay | Admitting: Unknown Physician Specialty

## 2020-09-08 MED ORDER — NIRMATRELVIR/RITONAVIR (PAXLOVID)TABLET: 3.0000 | ORAL_TABLET | Freq: Two times a day (BID) | ORAL | 0 refills | Status: DC

## 2020-09-08 NOTE — Telephone Encounter (Signed)
Outpatient Oral COVID Treatment Note  I connected with Beverly Johnson on 09/08/2020/10:52 AM by telephone and verified that I am speaking with the correct person using two identifiers.  I discussed the limitations, risks, security, and privacy concerns of performing an evaluation and management service by telephone and the availability of in person appointments. I also discussed with the patient that there may be a patient responsible charge related to this service. The patient expressed understanding and agreed to proceed.  Patient location: home Provider location: home  Diagnosis: COVID-19 infection  Purpose of visit: Discussion of potential use of Molnupiravir or Paxlovid, a new treatment for mild to moderate COVID-19 viral infection in non-hospitalized patients.   Subjective: Patient is a 45 y.o. female who has been diagnosed with COVID 19 viral infection.  Their symptoms began on 1/21 with cough.    Past Medical History:  Diagnosis Date  . Diabetes mellitus without complication ([place])   . Gallstones 03/10/2011  . HIV infection ([place])   . Immune deficiency disorder ([facility])     No Known Allergies   Current Outpatient Medications:  .  benzonatate (TESSALON) 100 MG capsule, Take 1 capsule (100 mg total) by mouth every 8 (eight) hours. (Patient not taking: Reported on 05/27/2020), Disp: 21 capsule, Rfl: 0 .  cetirizine (ZYRTEC ALLERGY) 10 MG tablet, Take 1 tablet (10 mg total) by mouth daily. (Patient not taking: Reported on 05/27/2020), Disp: 30 tablet, Rfl: 0 .  cyclobenzaprine (FLEXERIL) 10 MG tablet, Take 1 tablet (10 mg total) by mouth 2 (two) times daily as needed for muscle spasms. (Patient not taking: Reported on 05/27/2020), Disp: 20 tablet, Rfl: 0 .  fluconazole (DIFLUCAN) 150 MG tablet, Take 1 tablet (150 mg total) by mouth daily. Take 1 tablet, repeat in 3 days if symptoms persist (Patient not taking: Reported on 05/27/2020), Disp: 2 tablet, Rfl: 0 .  fluticasone (FLONASE) 50  MCG/ACT nasal spray, Place 1 spray into both nostrils daily. (Patient not taking: Reported on 05/27/2020), Disp: 11.1 mL, Rfl: 0 .  GENVOYA 150-150-200-10 MG TABS tablet, TAKE 1 TABLET BY MOUTH DAILY WITH BREAKFAST. (Patient taking differently: Take 1 tablet by mouth daily with breakfast. ), Disp: 30 tablet, Rfl: 3 .  glucose blood test strip, Check your sugar in the morning before you eat breakfast, and one hour after a meal., Disp: 100 each, Rfl: 2 .  glucose monitoring kit (FREESTYLE) monitoring kit, 1 each by Does not apply route daily. Check glucose once in the morning before breakfast and 1 hour after a meal, Disp: 1 each, Rfl: 0 .  ibuprofen (ADVIL) 600 MG tablet, Take 1 tablet (600 mg total) by mouth every 6 (six) hours as needed. (Patient not taking: Reported on 06/18/2020), Disp: 30 tablet, Rfl: 0 .  metFORMIN (GLUCOPHAGE) 500 MG tablet, Take 2 tablets (1,000 mg total) by mouth 2 (two) times daily with a meal., Disp: 120 tablet, Rfl: 11 .  methocarbamol (ROBAXIN) 500 MG tablet, Take 1 tablet (500 mg total) by mouth 2 (two) times daily. (Patient not taking: Reported on 05/27/2020), Disp: 20 tablet, Rfl: 0 .  metroNIDAZOLE (FLAGYL) 500 MG tablet, Take 1 tablet (500 mg total) by mouth 2 (two) times daily. (Patient not taking: Reported on 05/27/2020), Disp: 14 tablet, Rfl: 0 .  sitaGLIPtin (JANUVIA) 100 MG tablet, Take 1 tablet (100 mg total) by mouth daily., Disp: 30 tablet, Rfl: 11  Objective: Patient appears/sounds congested.  They are in no apparent distress.  Breathing is non labored.  Mood  and behavior are normal.  Laboratory Data:  Recent Results (from the past 2160 hour(s))  Basic metabolic panel     Status: Abnormal   Collection Time: 06/18/20  8:36 PM  Result Value Ref Range   Sodium 133 (L) 135 - 145 mmol/L   Potassium 3.5 3.5 - 5.1 mmol/L   Chloride 102 98 - 111 mmol/L   CO2 22 22 - 32 mmol/L   Glucose, Bld 346 (H) 70 - 99 mg/dL    Comment: Glucose reference range applies only  to samples taken after fasting for at least 8 hours.   BUN 8 6 - 20 mg/dL   Creatinine, Ser 0.64 0.44 - 1.00 mg/dL   Calcium 9.1 8.9 - 10.3 mg/dL   GFR, Estimated >60 >60 mL/min    Comment: (NOTE) Calculated using the CKD-EPI Creatinine Equation (2021)    Anion gap 9 5 - 15    Comment: Performed at [facility] [address] [place], [place] [address]  CBC     Status: Abnormal   Collection Time: 06/18/20  8:36 PM  Result Value Ref Range   WBC 4.9 4.0 - 10.5 K/uL   RBC 4.95 3.87 - 5.11 MIL/uL   Hemoglobin 10.0 (L) 12.0 - 15.0 g/dL   HCT 35.6 (L) 36.0 - 46.0 %   MCV 71.9 (L) 80.0 - 100.0 fL   MCH 20.2 (L) 26.0 - 34.0 pg   MCHC 28.1 (L) 30.0 - 36.0 g/dL   RDW 14.6 11.5 - 15.5 %   Platelets 219 150 - 400 K/uL   nRBC 0.0 0.0 - 0.2 %    Comment: Performed at [facility] [address], [place], [place] [address]  Troponin I (High Sensitivity)     Status: None   Collection Time: 06/18/20  8:36 PM  Result Value Ref Range   Troponin I (High Sensitivity) 3 <18 ng/L    Comment: (NOTE) Elevated high sensitivity troponin I (hsTnI) values and significant  changes across serial measurements may suggest ACS but many other  chronic and acute conditions are known to elevate hsTnI results.  Refer to the "Links" section for chest pain algorithms and additional  guidance. Performed at [facility], [place] [address], [place], [place] [address]   Protime-INR (order if Patient is taking Coumadin / Warfarin)     Status: None   Collection Time: 06/18/20  8:36 PM  Result Value Ref Range   Prothrombin Time 14.1 11.4 - 15.2 seconds   INR 1.1 0.8 - 1.2    Comment: (NOTE) INR goal varies based on device and disease states. Performed at [facility], [place] [address], [place], [place] [address]   I-Stat beta hCG blood, ED     Status: None   Collection Time: 06/18/20 10:10 PM  Result Value Ref Range   I-stat hCG, quantitative <5.0 <5 mIU/mL   Comment 3            Comment:    GEST. AGE      CONC.  (mIU/mL)   <=1 WEEK        5 - 50     2 WEEKS       50 - 500     3 WEEKS       100 - 10,000     4 WEEKS     1,000 - 30,000        FEMALE AND NON-PREGNANT FEMALE:     LESS THAN 5  mIU/mL   Troponin I (High Sensitivity)     Status: None   Collection Time: 06/18/20 10:54 PM  Result Value Ref Range   Troponin I (High Sensitivity) <2 <18 ng/L    Comment: (NOTE) Elevated high sensitivity troponin I (hsTnI) values and significant  changes across serial measurements may suggest ACS but many other  chronic and acute conditions are known to elevate hsTnI results.  Refer to the "Links" section for chest pain algorithms and additional  guidance. Performed at [facility], [place] [address] [place], [place] [address]   SARS CORONAVIRUS 2 (TAT 6-24 HRS) Nasopharyngeal Nasopharyngeal Swab     Status: Abnormal   Collection Time: 09/06/20 10:39 AM   Specimen: Nasopharyngeal Swab  Result Value Ref Range   SARS Coronavirus 2 POSITIVE (A) NEGATIVE    Comment: (NOTE) SARS-CoV-2 target nucleic acids are DETECTED.  The SARS-CoV-2 RNA is generally detectable in upper and lower respiratory specimens during the acute phase of infection. Positive results are indicative of the presence of SARS-CoV-2 RNA. Clinical correlation with patient history and other diagnostic information is  necessary to determine patient infection status. Positive results do not rule out bacterial infection or co-infection with other viruses.  The expected result is Negative.  Fact Sheet for Patients: SugarRoll.be  Fact Sheet for Healthcare Providers: https://www.woods-mathews.com/  This test is not yet approved or cleared by the [place] FDA and  has been authorized for detection and/or diagnosis of SARS-CoV-2 by FDA under an Emergency Use Authorization (EUA). This EUA will remain  in effect (meaning this test can be used) for the duration of  the COVID-19 declaration under Section 564(b)(1) of the Act, 21 U. S.C. section 360bbb-3(b)(1), unless the authorization is terminated or revoked sooner.   Performed at [facility], [place] [address] [place], [place] [address]      Assessment: 45 y.o. female with mild/moderate COVID 19 viral infection diagnosed on 1/21 at high risk for progression to severe COVID 19.  Plan:  This patient is a 45 y.o. female that meets the following criteria for Emergency Use Authorization of: Paxlovid 1. Age >12 yr AND > 40 kg 2. SARS-COV-2 positive test 3. Symptom onset < 5 days 4. Mild-to-moderate COVID disease with high risk for severe progression to hospitalization or death  I have spoken and communicated the following to the patient or parent/caregiver regarding: 1. Paxlovid is an unapproved drug that is authorized for use under an Emergency Use Authorization.  2. There are no adequate, approved, available products for the treatment of COVID-19 in adults who have mild-to-moderate COVID-19 and are at high risk for progressing to severe COVID-19, including hospitalization or death. 3. Other therapeutics are currently authorized. For additional information on all products authorized for treatment or prevention of COVID-19, please see TanEmporium.pl.  4. There are benefits and risks of taking this treatment as outlined in the "Fact Sheet for Patients and Caregivers."  5. "Fact Sheet for Patients and Caregivers" was reviewed with patient. A hard copy will be provided to patient from pharmacy prior to the patient receiving treatment. 6. Patients should continue to self-isolate and use infection control measures (e.g., wear mask, isolate, social distance, avoid sharing personal items, clean and disinfect "high touch" surfaces, and frequent handwashing) according to CDC guidelines.  7. The patient  or parent/caregiver has the option to accept or refuse treatment. 8. Patient medication history was reviewed for potential drug interactions:Interaction with home meds: Flonase and will hold 9. Patient's creatinine  clearance was calculated to be 205, and they were therefore prescribed Normal dose (CrCl>60) - nirmatrelvir 14m tab (2 tablet) by mouth twice daily AND ritonavir 1039mtab (1 tablet) by mouth twice daily   After reviewing above information with the patient, the patient agrees to receive Paxlovid.  Follow up instructions:    . Take prescription BID x 5 days as directed . Reach out to pharmacist for counseling on medication if desired . For concerns regarding further COVID symptoms please follow up with your PCP or urgent care . For urgent or life-threatening issues, seek care at your local emergency department  The patient was provided an opportunity to ask questions, and all were answered. The patient agreed with the plan and demonstrated an understanding of the instructions.   [facility]  The patient was advised to call their PCP or seek an in-person evaluation if the symptoms worsen or if the condition fails to improve as anticipated.   I provided 20 minutes of non face-to-face telephone visit time during this encounter, and > 50% was spent counseling as documented under my assessment & plan.  ChKathrine HaddockNP 09/08/2020 /10:52 AM

## 2020-09-09 ENCOUNTER — Other Ambulatory Visit: Payer: Self-pay | Admitting: Internal Medicine

## 2020-09-09 ENCOUNTER — Telehealth (HOSPITAL_COMMUNITY): Payer: Self-pay

## 2020-09-09 MED ORDER — NIRMATRELVIR/RITONAVIR (PAXLOVID)TABLET: 3.0000 | ORAL_TABLET | Freq: Two times a day (BID) | ORAL | 0 refills | Status: AC

## 2020-09-09 NOTE — Telephone Encounter (Signed)
Patient was prescribed oral covid treatment Paxlovid and treatment note was reviewed. Medication has been received by Beverly Johnson and reviewed for appropriateness.  Drug Interactions or Dosage Adjustments Noted: hold flonase. Sending secure chat about [facility]  Delivery Method: pick up  Patient contacted for counseling on 09/09/20 and verbalized understanding.   Delivery or Pick-Up Date: 09/09/20   [name] 09/09/2020, 10:34 AM [facility] Outpatient Pharmacist Phone# [phone]

## 2020-09-12 ENCOUNTER — Telehealth (HOSPITAL_COMMUNITY): Payer: Self-pay

## 2020-09-12 NOTE — Telephone Encounter (Signed)
Pt did not pick up paxlovid and the drug was returned to inventory. Rx was suspended.

## 2020-10-01 DIAGNOSIS — E1165 Type 2 diabetes mellitus with hyperglycemia: Secondary | ICD-10-CM | POA: Diagnosis not present

## 2020-10-01 DIAGNOSIS — R531 Weakness: Secondary | ICD-10-CM | POA: Diagnosis not present

## 2020-10-01 DIAGNOSIS — I10 Essential (primary) hypertension: Secondary | ICD-10-CM | POA: Diagnosis not present

## 2020-10-01 DIAGNOSIS — R001 Bradycardia, unspecified: Secondary | ICD-10-CM | POA: Diagnosis not present

## 2020-10-21 ENCOUNTER — Other Ambulatory Visit: Payer: Self-pay

## 2020-10-21 ENCOUNTER — Encounter (HOSPITAL_COMMUNITY): Payer: Self-pay

## 2020-10-21 ENCOUNTER — Emergency Department (HOSPITAL_COMMUNITY): Payer: Medicaid Other

## 2020-10-21 ENCOUNTER — Emergency Department (HOSPITAL_COMMUNITY)
Admission: EM | Admit: 2020-10-21 | Discharge: 2020-10-21 | Disposition: A | Discharge status: Home / Self Care | Payer: Medicaid Other | Attending: Emergency Medicine | Admitting: Emergency Medicine

## 2020-10-21 DIAGNOSIS — N390 Urinary tract infection, site not specified: Secondary | ICD-10-CM

## 2020-10-21 DIAGNOSIS — Z21 Asymptomatic human immunodeficiency virus [HIV] infection status: Secondary | ICD-10-CM | POA: Diagnosis not present

## 2020-10-21 DIAGNOSIS — R000 Tachycardia, unspecified: Secondary | ICD-10-CM | POA: Diagnosis not present

## 2020-10-21 DIAGNOSIS — R079 Chest pain, unspecified: Secondary | ICD-10-CM | POA: Diagnosis not present

## 2020-10-21 DIAGNOSIS — Z8616 Personal history of COVID-19: Secondary | ICD-10-CM | POA: Diagnosis not present

## 2020-10-21 DIAGNOSIS — Z20822 Contact with and (suspected) exposure to covid-19: Secondary | ICD-10-CM | POA: Diagnosis not present

## 2020-10-21 DIAGNOSIS — R0602 Shortness of breath: Secondary | ICD-10-CM | POA: Diagnosis not present

## 2020-10-21 DIAGNOSIS — R112 Nausea with vomiting, unspecified: Secondary | ICD-10-CM | POA: Diagnosis not present

## 2020-10-21 DIAGNOSIS — E1165 Type 2 diabetes mellitus with hyperglycemia: Secondary | ICD-10-CM | POA: Insufficient documentation

## 2020-10-21 DIAGNOSIS — R9431 Abnormal electrocardiogram [ECG] [EKG]: Secondary | ICD-10-CM | POA: Diagnosis not present

## 2020-10-21 DIAGNOSIS — R0789 Other chest pain: Secondary | ICD-10-CM | POA: Diagnosis not present

## 2020-10-21 DIAGNOSIS — R739 Hyperglycemia, unspecified: Secondary | ICD-10-CM | POA: Diagnosis not present

## 2020-10-21 DIAGNOSIS — Z7984 Long term (current) use of oral hypoglycemic drugs: Secondary | ICD-10-CM | POA: Diagnosis not present

## 2020-10-21 LAB — CBG MONITORING, ED
Glucose-Capillary: 426 mg/dL — ABNORMAL HIGH (ref 70–99)
Glucose-Capillary: 259 mg/dL — ABNORMAL HIGH (ref 70–99)

## 2020-10-21 LAB — URINALYSIS, ROUTINE W REFLEX MICROSCOPIC
Bilirubin Urine: NEGATIVE
Glucose, UA: >=500 mg/dL — AB
Hgb urine dipstick: NEGATIVE
Ketones, ur: NEGATIVE mg/dL
Nitrite: NEGATIVE
Protein, ur: NEGATIVE mg/dL
Specific Gravity, Urine: 1.031 — ABNORMAL HIGH (ref 1.005–1.030)
pH: 5 (ref 5.0–8.0)

## 2020-10-21 LAB — COMPREHENSIVE METABOLIC PANEL
ALT: 16 U/L (ref 0–44)
AST: 19 U/L (ref 15–41)
Albumin: 3.3 g/dL — ABNORMAL LOW (ref 3.5–5.0)
Alkaline Phosphatase: 173 U/L — ABNORMAL HIGH (ref 38–126)
Anion gap: 10 (ref 5–15)
BUN: 12 mg/dL (ref 6–20)
CO2: 22 mmol/L (ref 22–32)
Calcium: 8.6 mg/dL — ABNORMAL LOW (ref 8.9–10.3)
Chloride: 98 mmol/L (ref 98–111)
Creatinine, Ser: 0.73 mg/dL (ref 0.44–1.00)
GFR, Estimated: >60 mL/min (ref >60)
Glucose, Bld: 454 mg/dL — ABNORMAL HIGH (ref 70–99)
Potassium: 3.5 mmol/L (ref 3.5–5.1)
Sodium: 130 mmol/L — ABNORMAL LOW (ref 135–145)
Total Bilirubin: 0.5 mg/dL (ref 0.3–1.2)
Total Protein: 8 g/dL (ref 6.5–8.1)

## 2020-10-21 LAB — I-STAT BETA HCG BLOOD, ED (MC, WL, AP ONLY): I-stat hCG, quantitative: <5 m[IU]/mL (ref <5)

## 2020-10-21 LAB — CBC WITH DIFFERENTIAL/PLATELET
Abs Immature Granulocytes: 0.01 10*3/uL (ref 0.00–0.07)
Basophils Absolute: 0 10*3/uL (ref 0.0–0.1)
Basophils Relative: 0 %
Eosinophils Absolute: 0 10*3/uL (ref 0.0–0.5)
Eosinophils Relative: 1 %
HCT: 32.1 % — ABNORMAL LOW (ref 36.0–46.0)
Hemoglobin: 9.5 g/dL — ABNORMAL LOW (ref 12.0–15.0)
Immature Granulocytes: 0 %
Lymphocytes Relative: 36 %
Lymphs Abs: 2.2 10*3/uL (ref 0.7–4.0)
MCH: 21.2 pg — ABNORMAL LOW (ref 26.0–34.0)
MCHC: 29.6 g/dL — ABNORMAL LOW (ref 30.0–36.0)
MCV: 71.5 fL — ABNORMAL LOW (ref 80.0–100.0)
Monocytes Absolute: 0.5 10*3/uL (ref 0.1–1.0)
Monocytes Relative: 8 %
Neutro Abs: 3.3 10*3/uL (ref 1.7–7.7)
Neutrophils Relative %: 55 %
Platelets: 180 10*3/uL (ref 150–400)
RBC: 4.49 MIL/uL (ref 3.87–5.11)
RDW: 15.3 % (ref 11.5–15.5)
WBC: 6.1 10*3/uL (ref 4.0–10.5)
nRBC: 0 % (ref 0.0–0.2)

## 2020-10-21 LAB — RESP PANEL BY RT-PCR (FLU A&B, COVID) ARPGX2
Influenza A by PCR: NEGATIVE
Influenza B by PCR: NEGATIVE
SARS Coronavirus 2 by RT PCR: NEGATIVE

## 2020-10-21 LAB — LIPASE, BLOOD: Lipase: 47 U/L (ref 11–51)

## 2020-10-21 LAB — LACTIC ACID, PLASMA: Lactic Acid, Venous: 1.9 mmol/L (ref 0.5–1.9)

## 2020-10-21 MED ORDER — ONDANSETRON HCL 4 MG/2ML IJ SOLN
4.0000 mg | Freq: Once | INTRAMUSCULAR | Status: AC
  Administered 2020-10-21: 4 mg via INTRAVENOUS
  Filled 2020-10-21: qty 2

## 2020-10-21 MED ORDER — CEPHALEXIN 500 MG PO CAPS: 500.0000 mg | ORAL_CAPSULE | Freq: Four times a day (QID) | ORAL | 0 refills | Status: DC

## 2020-10-21 MED ORDER — SODIUM CHLORIDE 0.9 % IV SOLN
1.0000 g | Freq: Once | INTRAVENOUS | Status: AC
  Administered 2020-10-21: 1 g via INTRAVENOUS
  Filled 2020-10-21: qty 10

## 2020-10-21 MED ORDER — INSULIN ASPART 100 UNIT/ML ~~LOC~~ SOLN
6.0000 [IU] | Freq: Once | SUBCUTANEOUS | Status: AC
  Administered 2020-10-21: 6 [IU] via INTRAVENOUS
  Filled 2020-10-21: qty 0.06

## 2020-10-21 MED ORDER — SODIUM CHLORIDE 0.9 % IV BOLUS
1000.0000 mL | Freq: Once | INTRAVENOUS | Status: AC
  Administered 2020-10-21: 1000 mL via INTRAVENOUS

## 2020-10-21 NOTE — ED Notes (Signed)
Care assumed. Report received from Jenny Reichmann, [place]

## 2020-10-21 NOTE — ED Provider Notes (Signed)
[facility] DEPT Provider Note   CSN: [id number] Arrival date & time: 10/21/20  1550     History Chief Complaint  Patient presents with  . Hyperglycemia    Beverly Johnson is a 45 y.o. female.  45 year old female with prior medical history as detailed below presents for evaluation of reported malaise and fatigue.  Patient reports onset of symptoms her last 3 to 4 days.  She denies fever.  She does report some mild increased urinary frequency.  She denies dysuria.  She denies abdominal pain or vomiting.  She reports that her sugars have been higher than normal over the last 24 hours.  She takes only Metformin for her diabetes.  She denies chest pain or shortness of breath.  She denies cough or congestion.    The history is provided by the patient and medical records.  Illness Location:  Mailaise and fatigue  Severity:  Mild Onset quality:  Gradual Duration:  4 days Timing:  Constant Progression:  Waxing and waning Chronicity:  New Associated symptoms: no fever        Past Medical History:  Diagnosis Date  . Diabetes mellitus without complication ([place])   . Gallstones 03/10/2011  . HIV infection ([place])   . Immune deficiency disorder [facility])     Patient Active Problem List   Diagnosis Date Noted  . Hyperglycemia 06/16/2019  . History of 2019 novel coronavirus disease (COVID-19) 06/16/2019  . Dehydration 06/16/2019  . AKI (acute kidney injury) ([place]) 06/16/2019  . Candidal esophagitis ([place]) 06/16/2019  . Abnormal ECG 06/16/2019  . Acute respiratory disease due to COVID-19 virus 06/02/2019  . Medication monitoring encounter 11/12/2017  . Breast nodule 09/02/2017  . Screening examination for venereal disease 03/30/2017  . Encounter for long-term (current) use of high-risk medication 03/30/2017  . Microalbuminuria due to type 2 diabetes mellitus ([place]) 03/30/2017  . Knee pain, chronic 03/30/2017  . Vitamin D deficiency 03/21/2017  . Bilateral  leg cramps 01/22/2017  . Healthcare maintenance 03/02/2014  . Morbid obesity with BMI of 45.0-49.9, adult ([place]) 04/27/2012  . Uncontrolled type 2 diabetes mellitus ([place]) 12/08/2010  . Human immunodeficiency virus (HIV) disease ([place]) 09/29/2006    Past Surgical History:  Procedure Laterality Date  . CHOLECYSTECTOMY  06/29/2011   Procedure: LAPAROSCOPIC CHOLECYSTECTOMY WITH INTRAOPERATIVE CHOLANGIOGRAM;  Surgeon: Merrie Roof, MD;  Location: [facility];  Service: General;  Laterality: N/A;  . NO PAST SURGERIES       OB History   No obstetric history on file.     Family History  Problem Relation Age of Onset  . Healthy Mother   . Healthy Father     Social History   Tobacco Use  . Smoking status: Never Smoker  . Smokeless tobacco: Never Used  Vaping Use  . Vaping Use: Never used  Substance Use Topics  . Alcohol use: No  . Drug use: Yes    Types: Marijuana    Comment: CBD    Home Medications Prior to Admission medications   Medication Sig Start Date End Date Taking? Authorizing Provider  GENVOYA 150-150-200-10 MG TABS tablet TAKE 1 TABLET BY MOUTH DAILY WITH BREAKFAST. Patient taking differently: Take 1 tablet by mouth daily with breakfast. 09/02/18  Yes Comer, Okey Regal, MD  metFORMIN (GLUCOPHAGE) 500 MG tablet Take 2 tablets (1,000 mg total) by mouth 2 (two) times daily with a meal. 03/22/17 09/08/26 Yes Ledell Noss, MD  sitaGLIPtin (JANUVIA) 100 MG tablet Take 1 tablet (100 mg  total) by mouth daily. 08/05/17  Yes Marcelyn Bruins, MD  benzonatate (TESSALON) 100 MG capsule Take 1 capsule (100 mg total) by mouth every 8 (eight) hours. Patient not taking: No sig reported 11/26/19   Henderly, Britni A, PA-C  cetirizine (ZYRTEC ALLERGY) 10 MG tablet Take 1 tablet (10 mg total) by mouth daily. Patient not taking: No sig reported 11/26/19   Henderly, Britni A, PA-C  cyclobenzaprine (FLEXERIL) 10 MG tablet Take 1 tablet (10 mg total) by mouth 2 (two) times daily as needed for muscle  spasms. Patient not taking: No sig reported 02/25/20   Recardo Evangelist, PA-C  fluconazole (DIFLUCAN) 150 MG tablet Take 1 tablet (150 mg total) by mouth daily. Take 1 tablet, repeat in 3 days if symptoms persist Patient not taking: Reported on 05/27/2020 05/10/20   Darr, Edison Nasuti, PA-C  glucose blood test strip Check your sugar in the morning before you eat breakfast, and one hour after a meal. 05/26/19   Melynda Ripple, MD  glucose monitoring kit (FREESTYLE) monitoring kit 1 each by Does not apply route daily. Check glucose once in the morning before breakfast and 1 hour after a meal 05/26/19   Melynda Ripple, MD  ibuprofen (ADVIL) 600 MG tablet Take 1 tablet (600 mg total) by mouth every 6 (six) hours as needed. Patient not taking: No sig reported 11/04/19   Faustino Congress, NP  methocarbamol (ROBAXIN) 500 MG tablet Take 1 tablet (500 mg total) by mouth 2 (two) times daily. Patient not taking: No sig reported 11/04/19   Faustino Congress, NP  metroNIDAZOLE (FLAGYL) 500 MG tablet Take 1 tablet (500 mg total) by mouth 2 (two) times daily. Patient not taking: No sig reported 05/14/20   Lamptey, Myrene Galas, MD    Allergies    Patient has no known allergies.  Review of Systems   Review of Systems  Constitutional: Negative for fever.  All other systems reviewed and are negative.   Physical Exam Updated Vital Signs BP 102/64   Pulse 94   Temp 98.6 F (37 C) (Oral)   Resp (!) 21   SpO2 100%   Physical Exam Vitals and nursing note reviewed.  Constitutional:      General: She is not in acute distress.    Appearance: Normal appearance. She is well-developed and well-nourished.  HENT:     Head: Normocephalic and atraumatic.     Mouth/Throat:     Mouth: Oropharynx is clear and moist.  Eyes:     Extraocular Movements: EOM normal.     Conjunctiva/sclera: Conjunctivae normal.     Pupils: Pupils are equal, round, and reactive to light.  Cardiovascular:     Rate and Rhythm: Normal  rate and regular rhythm.     Heart sounds: Normal heart sounds.  Pulmonary:     Effort: Pulmonary effort is normal. No respiratory distress.     Breath sounds: Normal breath sounds.  Abdominal:     General: There is no distension.     Palpations: Abdomen is soft.     Tenderness: There is no abdominal tenderness.  Musculoskeletal:        General: No deformity or edema. Normal range of motion.     Cervical back: Normal range of motion and neck supple.  Skin:    General: Skin is warm and dry.  Neurological:     General: No focal deficit present.     Mental Status: She is alert and oriented to person, place, and time. Mental status  is at baseline.  Psychiatric:        Mood and Affect: Mood and affect normal.     ED Results / Procedures / Treatments   Labs (all labs ordered are listed, but only abnormal results are displayed) Labs Reviewed  URINALYSIS, ROUTINE W REFLEX MICROSCOPIC - Abnormal; Notable for the following components:      Result Value   APPearance HAZY (*)    Specific Gravity, Urine 1.031 (*)    Glucose, UA >=500 (*)    Leukocytes,Ua LARGE (*)    Bacteria, UA RARE (*)    Crystals PRESENT (*)    All other components within normal limits  COMPREHENSIVE METABOLIC PANEL - Abnormal; Notable for the following components:   Sodium 130 (*)    Glucose, Bld 454 (*)    Calcium 8.6 (*)    Albumin 3.3 (*)    Alkaline Phosphatase 173 (*)    All other components within normal limits  CBC WITH DIFFERENTIAL/PLATELET - Abnormal; Notable for the following components:   Hemoglobin 9.5 (*)    HCT 32.1 (*)    MCV 71.5 (*)    MCH 21.2 (*)    MCHC 29.6 (*)    All other components within normal limits  CBG MONITORING, ED - Abnormal; Notable for the following components:   Glucose-Capillary 426 (*)    All other components within normal limits  CBG MONITORING, ED - Abnormal; Notable for the following components:   Glucose-Capillary 259 (*)    All other components within normal  limits  URINE CULTURE  RESP PANEL BY RT-PCR (FLU A&B, COVID) ARPGX2  LACTIC ACID, PLASMA  LIPASE, BLOOD  LACTIC ACID, PLASMA  I-STAT BETA HCG BLOOD, ED ([facility], [facility], AP ONLY)  CBG MONITORING, ED    EKG EKG Interpretation  Date/Time:  Monday October 21 2020 16:20:43 EST Ventricular Rate:  93 PR Interval:    QRS Duration: 74 QT Interval:  339 QTC Calculation: 422 R Axis:   39 Text Interpretation: Sinus rhythm Confirmed by Dene Gentry [phone]) on 10/21/2020 4:32:50 PM   Radiology DG Chest Port 1 View  Result Date: 10/21/2020 CLINICAL DATA:  Acute onset shortness of breath. Hyperglycemia. Current history of diabetes. EXAM: PORTABLE CHEST 1 VIEW COMPARISON:  06/18/2020 and earlier. FINDINGS: Cardiac silhouette normal in size for AP portable technique and degree of inspiration, unchanged. Lungs clear. Bronchovascular markings normal. Pulmonary vascularity normal. No visible pleural effusions. No pneumothorax. IMPRESSION: No acute cardiopulmonary disease. Electronically Signed   By: Evangeline Dakin M.D.   On: 10/21/2020 16:38    Procedures Procedures   Medications Ordered in ED Medications  sodium chloride 0.9 % bolus 1,000 mL (0 mLs Intravenous Stopped 10/21/20 1738)  ondansetron (ZOFRAN) injection 4 mg (4 mg Intravenous Given 10/21/20 1634)  cefTRIAXone (ROCEPHIN) 1 g in sodium chloride 0.9 % 100 mL IVPB (0 g Intravenous Stopped 10/21/20 1921)  insulin aspart (novoLOG) injection 6 Units (6 Units Intravenous Given 10/21/20 2706)    ED Course  I have reviewed the triage vital signs and the nursing notes.  Pertinent labs & imaging results that were available during my care of the patient were reviewed by me and considered in my medical decision making (see chart for details).    MDM Rules/Calculators/A&P                          MDM  Screen complete  JALEIYAH ALAS was evaluated in Emergency Department on 10/21/2020 for the  symptoms described in the history of present illness. She was  evaluated in the context of the global COVID-19 pandemic, which necessitated consideration that the patient might be at risk for infection with the SARS-CoV-2 virus that causes COVID-19. Institutional protocols and algorithms that pertain to the evaluation of patients at risk for COVID-19 are in a state of rapid change based on information released by regulatory bodies including the CDC and federal and state organizations. These policies and algorithms were followed during the patient's care in the ED.   Patient is presenting for evaluation of reported malaise and fatigue.  Patient with noted hyperglycemia.  Work-up demonstrates evidence of hyperglycemia without additional metabolic abnormality.  UA is suggestive of possible UTI.  We will treat for UTI.  Patient appears appropriate at this time for further outpatient management.  Patient is understanding for close follow-up.  Strict return precautions given and understood.  Final Clinical Impression(s) / ED Diagnoses Final diagnoses:  Hyperglycemia  Urinary tract infection without hematuria, site unspecified    Rx / DC Orders ED Discharge Orders         Ordered    cephALEXin (KEFLEX) 500 MG capsule  4 times daily        10/21/20 2007           Valarie Merino, MD 10/21/20 2007

## 2020-10-21 NOTE — ED Triage Notes (Signed)
MS reports from work, feeling poorly x 4 days and back pain, nausea. Hx of diabetes, non compliant with CBG. EMS reports CBG "High" on scene. Pt states HIV Pos.  BP 125/88 HR 100 RR 18 Sp02 100 RA  20ga RAC 526ml NS enroute

## 2020-10-21 NOTE — Discharge Instructions (Signed)
Please return for any problem.  °

## 2020-10-22 ENCOUNTER — Telehealth: Payer: Self-pay | Admitting: *Deleted

## 2020-10-22 NOTE — Telephone Encounter (Signed)
Transition Care Management Unsuccessful Follow-up Telephone Call  Date of discharge and from where:  10/21/2020 - [facility]  Attempts:  1st Attempt  Reason for unsuccessful TCM follow-up call:  Unable to reach patient

## 2020-10-23 ENCOUNTER — Telehealth: Payer: Self-pay

## 2020-10-23 ENCOUNTER — Telehealth: Payer: Self-pay | Admitting: *Deleted

## 2020-10-23 NOTE — Telephone Encounter (Signed)
Patient called complaining of nausea and vomiting x 1 week. Patient denies any fevers or diarrhea. Patient was seen at [facility] ED on 10/21/20 and diagnosed with a UTI. Patient currently taking keflex for the UTI.  Patient has been advised she can go to urgent care or ED if she is not feeling any better since starting the antibiotic for the UTI. Patient verbalized understanding.

## 2020-10-23 NOTE — Telephone Encounter (Signed)
Transition Care Management Follow-up Telephone Call  Date of discharge and from where: 10-21-2020  Beverly Johnson  How have you been since you were released from the hospital? Still having some Vomiting of liquid is able to hold food down.    Any questions or concerns? Stated she will go to Urgent Care if Vomiting of liquids does not stop.  Was let go from her primary care for to many NO/SHOWS.  Patient stated she is going to try to find a new doctor close to her.     Items Reviewed:  Did the pt receive and understand the discharge instructions provided? Yes   Medications obtained and verified? Yes   Other? No   Any new allergies since your discharge? No   Dietary orders reviewed? no  Do you have support at home? Yes   Home Care and Equipment/Supplies:Functional Questionnaire: (I = Independent and D = Dependent) ADLs: I  Bathing/Dressing- I  Meal Prep- I  Eating- I  Maintaining continence- I  Transferring/Ambulation- I  Managing Meds- I  Follow up appointments reviewed:   PCP Hospital f/u appt confirmed? No    Specialist Hospital f/u appt confirmed? No   Are transportation arrangements needed? No   If their condition worsens, is the pt aware to call PCP or go to the Emergency Dept.? YES  Was the patient provided with contact information for the PCP's office or ED?YES  Was to pt encouraged to call back with questions or concerns? YES

## 2020-10-24 LAB — URINE CULTURE: Culture: >=100000 — AB

## 2020-10-25 ENCOUNTER — Telehealth: Payer: Self-pay | Admitting: *Deleted

## 2020-10-25 NOTE — Telephone Encounter (Signed)
Post ED Visit - Positive Culture Follow-up  Culture report reviewed by antimicrobial stewardship pharmacist: [place] Team []  Elenor Quinones, Pharm.D. []  Heide Guile, Pharm.D., BCPS AQ-ID []  Parks Neptune, Pharm.D., BCPS []  Alycia Rossetti, Pharm.D., BCPS []  [place], Pharm.D., BCPS, AAHIVP []  Legrand Como, Pharm.D., BCPS, AAHIVP []  Salome Arnt, PharmD, BCPS []  Johnnette Gourd, PharmD, BCPS []  Hughes Better, PharmD, BCPS []  Leeroy Cha, PharmD []  Laqueta Linden, PharmD, BCPS []  Albertina Parr, PharmD  [place] Team []  Leodis Sias, PharmD []  Lindell Spar, PharmD []  Royetta Asal, PharmD []  Graylin Shiver, Rph []  Rema Fendt) Glennon Mac, PharmD []  Arlyn Dunning, PharmD []  Netta Cedars, PharmD []  Dia Sitter, PharmD []  Leone Haven, PharmD []  Gretta Arab, PharmD []  Theodis Shove, PharmD []  Peggyann Juba, PharmD []  Reuel Boom, PharmD   Positive urine culture Treated with Cephalexin, organism sensitive to the same and no further patient follow-up is required at this time. Laurey Arrow, PharmD Student  Harlon Flor Talley 10/25/2020, 1:06 PM

## 2020-10-31 DIAGNOSIS — Z20822 Contact with and (suspected) exposure to covid-19: Secondary | ICD-10-CM | POA: Diagnosis not present

## 2020-11-05 ENCOUNTER — Other Ambulatory Visit: Payer: Self-pay

## 2020-11-05 ENCOUNTER — Other Ambulatory Visit: Payer: Medicaid Other

## 2020-11-05 DIAGNOSIS — Z113 Encounter for screening for infections with a predominantly sexual mode of transmission: Secondary | ICD-10-CM

## 2020-11-05 DIAGNOSIS — B20 Human immunodeficiency virus [HIV] disease: Secondary | ICD-10-CM | POA: Diagnosis not present

## 2020-11-05 DIAGNOSIS — Z79899 Other long term (current) drug therapy: Secondary | ICD-10-CM | POA: Diagnosis not present

## 2020-11-06 LAB — T-HELPER CELL (CD4) - (RCID CLINIC ONLY)
CD4 % Helper T Cell: 39 % (ref 33–65)
CD4 T Cell Abs: 516 /uL (ref 400–1790)

## 2020-11-07 LAB — CBC WITH DIFFERENTIAL/PLATELET
Absolute Monocytes: 183 cells/uL — ABNORMAL LOW (ref 200–950)
Basophils Absolute: 22 cells/uL (ref 0–200)
Basophils Relative: 0.7 %
Eosinophils Absolute: 9 cells/uL — ABNORMAL LOW (ref 15–500)
Eosinophils Relative: 0.3 %
HCT: 33.4 % — ABNORMAL LOW (ref 35.0–45.0)
Hemoglobin: 9.6 g/dL — ABNORMAL LOW (ref 11.7–15.5)
Lymphs Abs: 1324 cells/uL (ref 850–3900)
MCH: 20.7 pg — ABNORMAL LOW (ref 27.0–33.0)
MCHC: 28.7 g/dL — ABNORMAL LOW (ref 32.0–36.0)
MCV: 72 fL — ABNORMAL LOW (ref 80.0–100.0)
MPV: 11 fL (ref 7.5–12.5)
Monocytes Relative: 5.9 %
Neutro Abs: 1562 cells/uL (ref 1500–7800)
Neutrophils Relative %: 50.4 %
Platelets: 192 10*3/uL (ref 140–400)
RBC: 4.64 10*6/uL (ref 3.80–5.10)
RDW: 15.3 % — ABNORMAL HIGH (ref 11.0–15.0)
Total Lymphocyte: 42.7 %
WBC: 3.1 10*3/uL — ABNORMAL LOW (ref 3.8–10.8)

## 2020-11-07 LAB — LIPID PANEL
Cholesterol: 148 mg/dL (ref <200)
HDL: 56 mg/dL (ref >=50)
LDL Cholesterol (Calc): 73 mg/dL (calc)
Non-HDL Cholesterol (Calc): 92 mg/dL (calc) (ref <130)
Total CHOL/HDL Ratio: 2.6 (calc) (ref <5.0)
Triglycerides: 108 mg/dL (ref <150)

## 2020-11-07 LAB — RPR: RPR Ser Ql: NONREACTIVE

## 2020-11-07 LAB — COMPLETE METABOLIC PANEL WITH GFR
AG Ratio: 0.9 (calc) — ABNORMAL LOW (ref 1.0–2.5)
ALT: 11 U/L (ref 6–29)
AST: 17 U/L (ref 10–30)
Albumin: 3.5 g/dL — ABNORMAL LOW (ref 3.6–5.1)
Alkaline phosphatase (APISO): 138 U/L — ABNORMAL HIGH (ref 31–125)
BUN: 7 mg/dL (ref 7–25)
CO2: 25 mmol/L (ref 20–32)
Calcium: 8.9 mg/dL (ref 8.6–10.2)
Chloride: 105 mmol/L (ref 98–110)
Creat: 0.64 mg/dL (ref 0.50–1.10)
GFR, Est African American: 127 mL/min/{1.73_m2} (ref >=60)
GFR, Est Non African American: 109 mL/min/{1.73_m2} (ref >=60)
Globulin: 3.8 g/dL (calc) — ABNORMAL HIGH (ref 1.9–3.7)
Glucose, Bld: 287 mg/dL — ABNORMAL HIGH (ref 65–99)
Potassium: 4.4 mmol/L (ref 3.5–5.3)
Sodium: 137 mmol/L (ref 135–146)
Total Bilirubin: 0.3 mg/dL (ref 0.2–1.2)
Total Protein: 7.3 g/dL (ref 6.1–8.1)

## 2020-11-07 LAB — HIV-1 RNA QUANT-NO REFLEX-BLD
HIV 1 RNA Quant: NOT DETECTED Copies/mL
HIV-1 RNA Quant, Log: NOT DETECTED Log cps/mL

## 2020-11-08 ENCOUNTER — Ambulatory Visit (HOSPITAL_COMMUNITY)
Admission: EM | Admit: 2020-11-08 | Discharge: 2020-11-08 | Disposition: A | Discharge status: Home / Self Care | Payer: Medicaid Other | Attending: Medical Oncology | Admitting: Medical Oncology

## 2020-11-08 ENCOUNTER — Other Ambulatory Visit: Payer: Self-pay

## 2020-11-08 ENCOUNTER — Encounter (HOSPITAL_COMMUNITY): Payer: Self-pay

## 2020-11-08 DIAGNOSIS — B3731 Acute candidiasis of vulva and vagina: Secondary | ICD-10-CM

## 2020-11-08 DIAGNOSIS — B373 Candidiasis of vulva and vagina: Secondary | ICD-10-CM | POA: Diagnosis not present

## 2020-11-08 DIAGNOSIS — Z7984 Long term (current) use of oral hypoglycemic drugs: Secondary | ICD-10-CM | POA: Diagnosis not present

## 2020-11-08 DIAGNOSIS — E119 Type 2 diabetes mellitus without complications: Secondary | ICD-10-CM

## 2020-11-08 DIAGNOSIS — B20 Human immunodeficiency virus [HIV] disease: Secondary | ICD-10-CM

## 2020-11-08 DIAGNOSIS — E118 Type 2 diabetes mellitus with unspecified complications: Secondary | ICD-10-CM

## 2020-11-08 MED ORDER — FLUCONAZOLE 150 MG PO TABS: ORAL_TABLET | ORAL | 0 refills | Status: DC

## 2020-11-08 NOTE — ED Provider Notes (Signed)
[place] [facility]    CSN: [id number] Arrival date & time: 11/08/20  1542      History   Chief Complaint Chief Complaint  Patient presents with  . Groin Swelling    HPI Beverly Johnson is a 45 y.o. female.   HPI   Groin swelling: Pt with a complex medical history presents with a 7 day history of groin swelling. Worsening slowly. She reports swelling of her labia, itching and a burning sensation. No known discharge, dysuria, fever. She has not tried anything for symptoms. No new partners.    Past Medical History:  Diagnosis Date  . Diabetes mellitus without complication ([place])   . Gallstones 03/10/2011  . HIV infection ([place])   . Immune deficiency disorder [facility])     Patient Active Problem List   Diagnosis Date Noted  . Hyperglycemia 06/16/2019  . History of 2019 novel coronavirus disease (COVID-19) 06/16/2019  . Dehydration 06/16/2019  . AKI (acute kidney injury) ([place]) 06/16/2019  . Candidal esophagitis ([place]) 06/16/2019  . Abnormal ECG 06/16/2019  . Acute respiratory disease due to COVID-19 virus 06/02/2019  . Medication monitoring encounter 11/12/2017  . Breast nodule 09/02/2017  . Screening examination for venereal disease 03/30/2017  . Encounter for long-term (current) use of high-risk medication 03/30/2017  . Microalbuminuria due to type 2 diabetes mellitus ([place]) 03/30/2017  . Knee pain, chronic 03/30/2017  . Vitamin D deficiency 03/21/2017  . Bilateral leg cramps 01/22/2017  . Healthcare maintenance 03/02/2014  . Morbid obesity with BMI of 45.0-49.9, adult ([place]) 04/27/2012  . Uncontrolled type 2 diabetes mellitus ([place]) 12/08/2010  . Human immunodeficiency virus (HIV) disease ([place]) 09/29/2006    Past Surgical History:  Procedure Laterality Date  . CHOLECYSTECTOMY  06/29/2011   Procedure: LAPAROSCOPIC CHOLECYSTECTOMY WITH INTRAOPERATIVE CHOLANGIOGRAM;  Surgeon: Merrie Roof, MD;  Location: [facility];  Service: General;  Laterality: N/A;  . NO PAST  SURGERIES      OB History   No obstetric history on file.      Home Medications    Prior to Admission medications   Medication Sig Start Date End Date Taking? Authorizing Provider  benzonatate (TESSALON) 100 MG capsule Take 1 capsule (100 mg total) by mouth every 8 (eight) hours. Patient not taking: No sig reported 11/26/19   Henderly, Britni A, PA-C  cephALEXin (KEFLEX) 500 MG capsule Take 1 capsule (500 mg total) by mouth 4 (four) times daily. 10/21/20   Valarie Merino, MD  cetirizine (ZYRTEC ALLERGY) 10 MG tablet Take 1 tablet (10 mg total) by mouth daily. Patient not taking: No sig reported 11/26/19   Henderly, Britni A, PA-C  cyclobenzaprine (FLEXERIL) 10 MG tablet Take 1 tablet (10 mg total) by mouth 2 (two) times daily as needed for muscle spasms. Patient not taking: No sig reported 02/25/20   Recardo Evangelist, PA-C  fluconazole (DIFLUCAN) 150 MG tablet Take 1 tablet (150 mg total) by mouth daily. Take 1 tablet, repeat in 3 days if symptoms persist Patient not taking: Reported on 05/27/2020 05/10/20   Darr, Edison Nasuti, PA-C  GENVOYA 150-150-200-10 MG TABS tablet TAKE 1 TABLET BY MOUTH DAILY WITH BREAKFAST. Patient taking differently: Take 1 tablet by mouth daily with breakfast. 09/02/18   Comer, Okey Regal, MD  glucose blood test strip Check your sugar in the morning before you eat breakfast, and one hour after a meal. 05/26/19   Melynda Ripple, MD  glucose monitoring kit (FREESTYLE) monitoring kit 1 each by Does not apply route daily.  Check glucose once in the morning before breakfast and 1 hour after a meal 05/26/19   Melynda Ripple, MD  ibuprofen (ADVIL) 600 MG tablet Take 1 tablet (600 mg total) by mouth every 6 (six) hours as needed. Patient not taking: No sig reported 11/04/19   Faustino Congress, NP  metFORMIN (GLUCOPHAGE) 500 MG tablet Take 2 tablets (1,000 mg total) by mouth 2 (two) times daily with a meal. 03/22/17 09/08/26  Ledell Noss, MD  methocarbamol (ROBAXIN) 500 MG tablet  Take 1 tablet (500 mg total) by mouth 2 (two) times daily. Patient not taking: No sig reported 11/04/19   Faustino Congress, NP  metroNIDAZOLE (FLAGYL) 500 MG tablet Take 1 tablet (500 mg total) by mouth 2 (two) times daily. Patient not taking: No sig reported 05/14/20   Chase Picket, MD  sitaGLIPtin (JANUVIA) 100 MG tablet Take 1 tablet (100 mg total) by mouth daily. 08/05/17   Marcelyn Bruins, MD    Family History Family History  Problem Relation Age of Onset  . Healthy Mother   . Healthy Father     Social History Social History   Tobacco Use  . Smoking status: Never Smoker  . Smokeless tobacco: Never Used  Vaping Use  . Vaping Use: Never used  Substance Use Topics  . Alcohol use: No  . Drug use: Yes    Types: Marijuana    Comment: CBD     Allergies   Patient has no known allergies.   Review of Systems Review of Systems   Physical Exam Triage Vital Signs ED Triage Vitals  Enc Vitals Group     BP 11/08/20 1626 118/74     Pulse Rate 11/08/20 1626 92     Resp 11/08/20 1626 16     Temp 11/08/20 1626 98.8 F (37.1 C)     Temp Source 11/08/20 1626 Oral     SpO2 11/08/20 1626 100 %     Weight --      Height --      Head Circumference --      Peak Flow --      Pain Score 11/08/20 1625 10     Pain Loc --      Pain Edu? --      Excl. in [place]? --    No data found.  Updated Vital Signs BP 118/74 (BP Location: Right Arm)   Pulse 92   Temp 98.8 F (37.1 C) (Oral)   Resp 16   LMP 10/21/2020 (Approximate)   SpO2 100%   Physical Exam Vitals and nursing note reviewed.  Constitutional:      General: She is not in acute distress.    Appearance: She is not ill-appearing, toxic-appearing or diaphoretic.  Genitourinary:    Comments: Moderate amount of thick white discharge with moderate edema or vulva.  Skin:    General: Skin is warm.      UC Treatments / Results  Labs (all labs ordered are listed, but only abnormal results are displayed) Labs  Reviewed - No data to display  EKG   Radiology No results found.  Procedures Procedures (including critical care time)  Medications Ordered in UC Medications - No data to display  Initial Impression / Assessment and Plan / UC Course  I have reviewed the triage vital signs and the nursing notes.  Pertinent labs & imaging results that were available during my care of the patient were reviewed by me and considered in my medical decision making (see chart  for details).    New.  Complex vaginal candidiasis complicated by her HIV and DM2.  Discussed this with patient.  Treating with Diflucan 150 mg twice daily every 3 days for 3 doses.  I personally discussed this with patient and make sure that she was aware of the treatment plan.  Work note given to patient. Final Clinical Impressions(s) / UC Diagnoses   Final diagnoses:  None   Discharge Instructions   None    ED Prescriptions    None     PDMP not reviewed this encounter.   Hughie Closs, [place] 11/08/20 1732

## 2020-11-08 NOTE — ED Triage Notes (Signed)
Pt reports swelling around vaginal area X 2 days. Pt states she thinks it is a reaction to a medication that was given to her.

## 2020-11-26 ENCOUNTER — Encounter: Payer: Self-pay | Admitting: Internal Medicine

## 2020-11-26 ENCOUNTER — Ambulatory Visit (INDEPENDENT_AMBULATORY_CARE_PROVIDER_SITE_OTHER): Payer: Medicaid Other | Admitting: Internal Medicine

## 2020-11-26 ENCOUNTER — Other Ambulatory Visit: Payer: Self-pay

## 2020-11-26 VITALS — BP 118/79 | HR 97 | Temp 97.8°F | Resp 16 | Ht 67.0 in | Wt 254.9 lb

## 2020-11-26 DIAGNOSIS — N920 Excessive and frequent menstruation with regular cycle: Secondary | ICD-10-CM | POA: Diagnosis not present

## 2020-11-26 DIAGNOSIS — Z23 Encounter for immunization: Secondary | ICD-10-CM | POA: Diagnosis not present

## 2020-11-26 DIAGNOSIS — E1165 Type 2 diabetes mellitus with hyperglycemia: Secondary | ICD-10-CM | POA: Diagnosis not present

## 2020-11-26 DIAGNOSIS — L84 Corns and callosities: Secondary | ICD-10-CM

## 2020-11-26 DIAGNOSIS — B20 Human immunodeficiency virus [HIV] disease: Secondary | ICD-10-CM

## 2020-11-26 NOTE — Assessment & Plan Note (Signed)
I have again emphasized the absolute need to get back into care for her diabetes.

## 2020-11-26 NOTE — Assessment & Plan Note (Signed)
I will re refer her back to podiatry.

## 2020-11-26 NOTE — Progress Notes (Signed)
   Subjective:    Patient ID: Beverly Johnson, female    DOB: 02-05-1976, 45 y.o.   MRN: [id number]  HPI Here for follow up of HIV She continues on Genvoya and no issues with getting, taking or tolerating the medication.  CD4 is 516 and viral load not detected.  She complains of ongoing foot pain on the ball of her left foot.  She previously went to podiatry for treatment and it was shaved down but she never returned.  Hurts to stand, walk.  She also complains of heavy menses and pain with mentruation.  Does not plan on having more children and asking about hysterectomy.  She self manages her diabetes and has not been to a PCP in years due to her work schedule.  Last Hgb A1c was 11.1 in July of 2021.  Also complains of bilateral hand pain with movement, left > right. This has been ongoing for months.     Review of Systems  Constitutional: Negative for fatigue and unexpected weight change.  Gastrointestinal: Negative for diarrhea and nausea.  Skin: Negative for rash.       Objective:   Physical Exam Eyes:     General: No scleral icterus. Cardiovascular:     Rate and Rhythm: Normal rate and regular rhythm.  Pulmonary:     Effort: Pulmonary effort is normal.  Neurological:     Mental Status: She is alert.  Psychiatric:        Mood and Affect: Mood normal.   SH: no tobacco       Assessment & Plan:

## 2020-11-26 NOTE — Assessment & Plan Note (Signed)
?   If she has fibroids or other issues.  Will refer her to gynecology for management.

## 2020-11-26 NOTE — Assessment & Plan Note (Signed)
She is doing well and no issues with the medication.  rtc in 6 months.

## 2020-12-06 ENCOUNTER — Other Ambulatory Visit: Payer: Self-pay

## 2020-12-06 ENCOUNTER — Encounter: Payer: Self-pay | Admitting: Podiatry

## 2020-12-06 ENCOUNTER — Ambulatory Visit: Payer: Medicaid Other | Admitting: Podiatry

## 2020-12-06 DIAGNOSIS — Z7189 Other specified counseling: Secondary | ICD-10-CM

## 2020-12-06 DIAGNOSIS — M79674 Pain in right toe(s): Secondary | ICD-10-CM

## 2020-12-06 DIAGNOSIS — B351 Tinea unguium: Secondary | ICD-10-CM | POA: Diagnosis not present

## 2020-12-06 DIAGNOSIS — Q828 Other specified congenital malformations of skin: Secondary | ICD-10-CM

## 2020-12-06 NOTE — Progress Notes (Signed)
  Subjective:  Patient ID: Beverly Johnson, female    DOB: 05/25/1976,  MRN: [id number]  Chief Complaint  Patient presents with  . Callouses    Left callus pt stated that it burns when pressure is applied    45 y.o. female returns for the above complaint.  Patient presents with follow-up of right porokeratosis submetatarsal 4.  Patient states it continues to be painful.  She states she works at [employer] constantly on her foot.  She denies any other acute complaints there is no color evidence of ulceration.  She is a diabetic with last A1c that was taken in 2020 at 13%.  Objective:   There were no vitals filed for this visit. Podiatric Exam: Vascular: dorsalis pedis and posterior tibial pulses are palpable bilateral. Capillary return is immediate. Temperature gradient is WNL. Skin turgor WNL  Sensorium: Normal Semmes Weinstein monofilament test. Normal tactile sensation bilaterally. Nail Exam: Pt has thick disfigured discolored nails with subungual debris noted bilateral entire nail hallux through fifth toenails Ulcer Exam: There is no evidence of ulcer or pre-ulcerative changes or infection. Orthopedic Exam: Muscle tone and strength are WNL. No limitations in general ROM. No crepitus or effusions noted. HAV  B/L.  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  Pain on palpation to the lesion.  No infection or ulcers  Assessment & Plan:  Patient was evaluated and treated and all questions answered.  Porokeratosis (hyperkeratotic lesion sub-met 4) -The lesion was adequately debrided down to healthy tissue no pinpoint bleeding noted ruling out wart. -Metatarsal pad was asked to offload the forefoot -I also discussed with her the possibility of surgery however she is a diabetic with last A1c that was taken in 2020 showing 13%.  At this time I gave her prescription to obtain new A1c and if it is below 8% I feel comfortable proceeding with doing a  floating osteotomy of the right submetatarsal 4 to give her some relief.  Patient states understanding..  Encounter for diabetic education -I explained to patient the etiology of diabetes and various treatment options were extensively discussed.  I encouraged her to come do diet control as well as follow-up with her endocrinologist as it is very imperative to bring her A1c down.  Last A1c was 13%.  Given that this was over 2 years ago I will plan on obtaining new A1c.  If she is able to work bring in the sugars that she could be a surgical candidate for floating osteotomy to give her some relief.  Patient states understanding   Boneta Lucks, DPM    No follow-ups on file.

## 2020-12-08 ENCOUNTER — Encounter (HOSPITAL_COMMUNITY): Payer: Self-pay | Admitting: Emergency Medicine

## 2020-12-08 ENCOUNTER — Emergency Department (HOSPITAL_COMMUNITY)
Admission: EM | Admit: 2020-12-08 | Discharge: 2020-12-08 | Disposition: A | Discharge status: Home / Self Care | Payer: Medicaid Other | Attending: Emergency Medicine | Admitting: Emergency Medicine

## 2020-12-08 DIAGNOSIS — Z7984 Long term (current) use of oral hypoglycemic drugs: Secondary | ICD-10-CM | POA: Diagnosis not present

## 2020-12-08 DIAGNOSIS — E1165 Type 2 diabetes mellitus with hyperglycemia: Secondary | ICD-10-CM | POA: Diagnosis not present

## 2020-12-08 DIAGNOSIS — R739 Hyperglycemia, unspecified: Secondary | ICD-10-CM

## 2020-12-08 DIAGNOSIS — Z21 Asymptomatic human immunodeficiency virus [HIV] infection status: Secondary | ICD-10-CM | POA: Diagnosis not present

## 2020-12-08 DIAGNOSIS — R7309 Other abnormal glucose: Secondary | ICD-10-CM | POA: Diagnosis present

## 2020-12-08 LAB — HEMOGLOBIN A1C
Hgb A1c MFr Bld: 13.5 % — ABNORMAL HIGH (ref 4.8–5.6)
Mean Plasma Glucose: 340.75 mg/dL

## 2020-12-08 LAB — CBG MONITORING, ED: Glucose-Capillary: 443 mg/dL — ABNORMAL HIGH (ref 70–99)

## 2020-12-08 NOTE — Discharge Instructions (Signed)
Please read and follow all provided instructions.  Your diagnoses today include:  1. Hyperglycemia     Tests performed today include:  Blood sugar - high at 443  Hemoglobin A1c - will be back later today or tomorrow   Vital signs. See below for your results today.   Medications prescribed:   None  Take any prescribed medications only as directed.  Home care instructions:  Follow any educational materials contained in this packet.  Follow-up instructions: It is very important that she follow-up with a primary care doctor.  You may call the number provided for [facility] or call some of the numbers listed on the resource guide provided.   Return instructions:   Please return to the Emergency Department if you experience worsening symptoms.   Please return if you have any other emergent concerns.  Additional Information:  Your vital signs today were: BP 125/78 (BP Location: Right Arm)   Pulse (!) 104   Temp 98.2 F (36.8 C) (Oral)   Resp 18   LMP 11/26/2020   SpO2 96%  If your blood pressure (BP) was elevated above 135/85 this visit, please have this repeated by your doctor within one month. --------------

## 2020-12-08 NOTE — ED Provider Notes (Signed)
[facility] EMERGENCY DEPARTMENT Provider Note   CSN: [id number] Arrival date & time: 12/08/20  1413     History Chief Complaint  Patient presents with  . Wants A1C checked    Beverly Johnson is a 45 y.o. female.  Patient with history of HIV, non-insulin-dependent diabetes --presents to the emergency department today requesting hemoglobin A1c check.  Patient was seen by her podiatrist 2 days ago.  Apparently they recommended a procedure on her foot but they would not consider this until her diabetes is better controlled.  She has not had her A1c checked in about 2 years, so she decided to come here to have it checked.  She continues to take metformin twice a day.  She has an infectious disease physician but no primary care doctor.  Patient states that she has tried to establish care, but ever but is either full or not excepting patients.  She states that she checks her blood sugars regularly.  She denies nausea, vomiting, diarrhea, abdominal pain.          Past Medical History:  Diagnosis Date  . Diabetes mellitus without complication ([place])   . Gallstones 03/10/2011  . HIV infection ([place])   . Immune deficiency disorder [facility])     Patient Active Problem List   Diagnosis Date Noted  . Callus of foot 11/26/2020  . Heavy menses 11/26/2020  . History of 2019 novel coronavirus disease (COVID-19) 06/16/2019  . Abnormal ECG 06/16/2019  . Medication monitoring encounter 11/12/2017  . Breast nodule 09/02/2017  . Screening examination for venereal disease 03/30/2017  . Encounter for long-term (current) use of high-risk medication 03/30/2017  . Microalbuminuria due to type 2 diabetes mellitus ([place]) 03/30/2017  . Knee pain, chronic 03/30/2017  . Vitamin D deficiency 03/21/2017  . Healthcare maintenance 03/02/2014  . Morbid obesity with BMI of 45.0-49.9, adult ([place]) 04/27/2012  . Uncontrolled type 2 diabetes mellitus ([place]) 12/08/2010  . Human immunodeficiency virus  (HIV) disease ([place]) 09/29/2006    Past Surgical History:  Procedure Laterality Date  . CHOLECYSTECTOMY  06/29/2011   Procedure: LAPAROSCOPIC CHOLECYSTECTOMY WITH INTRAOPERATIVE CHOLANGIOGRAM;  Surgeon: Merrie Roof, MD;  Location: [facility];  Service: General;  Laterality: N/A;  . NO PAST SURGERIES       OB History   No obstetric history on file.     Family History  Problem Relation Age of Onset  . Healthy Mother   . Healthy Father     Social History   Tobacco Use  . Smoking status: Never Smoker  . Smokeless tobacco: Never Used  Vaping Use  . Vaping Use: Never used  Substance Use Topics  . Alcohol use: No  . Drug use: Yes    Types: Marijuana    Comment: CBD    Home Medications Prior to Admission medications   Medication Sig Start Date End Date Taking? Authorizing Provider  GENVOYA 150-150-200-10 MG TABS tablet TAKE 1 TABLET BY MOUTH DAILY WITH BREAKFAST. Patient taking differently: Take 1 tablet by mouth daily with breakfast. 09/02/18   Comer, Okey Regal, MD  glucose blood test strip Check your sugar in the morning before you eat breakfast, and one hour after a meal. 05/26/19   Melynda Ripple, MD  glucose monitoring kit (FREESTYLE) monitoring kit 1 each by Does not apply route daily. Check glucose once in the morning before breakfast and 1 hour after a meal 05/26/19   Melynda Ripple, MD  metFORMIN (GLUCOPHAGE) 500 MG tablet Take 2  tablets (1,000 mg total) by mouth 2 (two) times daily with a meal. 03/22/17 09/08/26  Ledell Noss, MD  sitaGLIPtin (JANUVIA) 100 MG tablet Take 1 tablet (100 mg total) by mouth daily. 08/05/17   Marcelyn Bruins, MD  cetirizine (ZYRTEC ALLERGY) 10 MG tablet Take 1 tablet (10 mg total) by mouth daily. Patient not taking: No sig reported 11/26/19 11/08/20  Henderly, Britni A, PA-C    Allergies    Patient has no known allergies.  Review of Systems   Review of Systems  Constitutional: Negative for fever.  Gastrointestinal: Negative for  abdominal pain, diarrhea, nausea and vomiting.  Musculoskeletal: Positive for myalgias. Negative for joint swelling.  Skin: Negative for wound.    Physical Exam Updated Vital Signs BP 125/78 (BP Location: Right Arm)   Pulse (!) 104   Temp 98.2 F (36.8 C) (Oral)   Resp 18   LMP 11/26/2020   SpO2 96%   Physical Exam Vitals and nursing note reviewed.  Constitutional:      Appearance: She is well-developed.  HENT:     Head: Normocephalic and atraumatic.     Mouth/Throat:     Mouth: Mucous membranes are moist.  Eyes:     Conjunctiva/sclera: Conjunctivae normal.  Pulmonary:     Effort: No respiratory distress.  Musculoskeletal:     Cervical back: Normal range of motion and neck supple.  Skin:    General: Skin is warm and dry.  Neurological:     Mental Status: She is alert.     ED Results / Procedures / Treatments   Labs (all labs ordered are listed, but only abnormal results are displayed) Labs Reviewed  CBG MONITORING, ED - Abnormal; Notable for the following components:      Result Value   Glucose-Capillary 443 (*)    All other components within normal limits  HEMOGLOBIN A1C    EKG None  Radiology No results found.  Procedures Procedures   Medications Ordered in ED Medications - No data to display  ED Course  I have reviewed the triage vital signs and the nursing notes.  Pertinent labs & imaging results that were available during my care of the patient were reviewed by me and considered in my medical decision making (see chart for details).   Patient seen and examined.  Will check blood sugar and send off hemoglobin A1c.  Discussed with patient that that test would not be back until later tonight or tomorrow morning and she will need to follow-up on the results online.  More importantly, we discussed the importance of having a primary care doctor to manage diabetes.  It is very possible that her hemoglobin A1c will still be elevated despite usage of  metformin.  In this case, she will need someone to change or add medications to bring her diabetes under better control.  This will need to be done by a primary care doctor or endocrinologist.  She verbalizes understanding.  Vital signs reviewed and are as follows: BP 125/78 (BP Location: Right Arm)   Pulse (!) 104   Temp 98.2 F (36.8 C) (Oral)   Resp 18   LMP 11/26/2020   SpO2 96%   Blood sugar here today is 443.  Her blood sugars are still poorly controlled.  She is otherwise asymptomatic however.  Clinically she does not have any evidence of DKA or profound dehydration.  Do not feel the need for further work-up, IV fluids or insulin, as I would just be treating the  blood sugar number.   Plan for d/c with PCP referrals.     MDM Rules/Calculators/A&P                          Hyperglycemia -patient presents today for hemoglobin A1c check.  Found to be hyperglycemic however no clinical signs of DKA.   Final Clinical Impression(s) / ED Diagnoses Final diagnoses:  Hyperglycemia    Rx / DC Orders ED Discharge Orders    None       Carlisle Cater, Hershal Coria 12/08/20 1525    Luna Fuse, MD 12/09/20 [phone]

## 2020-12-08 NOTE — ED Triage Notes (Signed)
Pt states she is here only because she needs her A1C checked.  States her podiatrist told her to have it checked and then he would schedule her foot surgery in approx 4 weeks.  Denies any complaints.

## 2020-12-09 ENCOUNTER — Telehealth: Payer: Self-pay

## 2020-12-09 NOTE — Telephone Encounter (Signed)
Transition Care Management Follow-up Telephone Call  Date of discharge and from where: 12/08/2020 from [facility]  How have you been since you were released from the hospital? Pt stated that she went to the hospital to have her A1c.   Any questions or concerns? No  Items Reviewed:  Did the pt receive and understand the discharge instructions provided? Yes   Medications obtained and verified? Yes   Other? No   Any new allergies since your discharge? No   Dietary orders reviewed? n/a  Do you have support at home? Yes   Functional Questionnaire: (I = Independent and D = Dependent) ADLs: I  Bathing/Dressing- I  Meal Prep- I  Eating- I  Maintaining continence- I  Transferring/Ambulation- I  Managing Meds- I   Follow up appointments reviewed:   PCP Hospital f/u appt confirmed? No  Pt is interested in establishing.   [facility] f/u appt confirmed? No   Are transportation arrangements needed? No   If their condition worsens, is the pt aware to call PCP or go to the Emergency Dept.? Yes  Was the patient provided with contact information for the PCP's office or ED? Yes  Was to pt encouraged to call back with questions or concerns? Yes

## 2020-12-12 ENCOUNTER — Other Ambulatory Visit: Payer: Self-pay

## 2020-12-12 ENCOUNTER — Encounter: Payer: Self-pay | Admitting: Nurse Practitioner

## 2020-12-12 ENCOUNTER — Ambulatory Visit (INDEPENDENT_AMBULATORY_CARE_PROVIDER_SITE_OTHER): Payer: Medicaid Other | Admitting: Nurse Practitioner

## 2020-12-12 VITALS — BP 120/75 | HR 89 | Temp 98.4°F | Ht 67.0 in | Wt 257.0 lb

## 2020-12-12 DIAGNOSIS — Z7689 Persons encountering health services in other specified circumstances: Secondary | ICD-10-CM

## 2020-12-12 DIAGNOSIS — Z6841 Body Mass Index (BMI) 40.0 and over, adult: Secondary | ICD-10-CM

## 2020-12-12 DIAGNOSIS — E1165 Type 2 diabetes mellitus with hyperglycemia: Secondary | ICD-10-CM | POA: Diagnosis not present

## 2020-12-12 MED ORDER — GLIPIZIDE 10 MG PO TABS: 10.0000 mg | ORAL_TABLET | Freq: Two times a day (BID) | ORAL | 3 refills | Status: DC

## 2020-12-12 NOTE — Patient Instructions (Signed)
Diabetes Mellitus and Nutrition, Adult When you have diabetes, or diabetes mellitus, it is very important to have healthy eating habits because your blood sugar (glucose) levels are greatly affected by what you eat and drink. Eating healthy foods in the right amounts, at about the same times every day, can help you:  Control your blood glucose.  Lower your risk of heart disease.  Improve your blood pressure.  Reach or maintain a healthy weight. What can affect my meal plan? Every person with diabetes is different, and each person has different needs for a meal plan. Your health care provider may recommend that you work with a dietitian to make a meal plan that is best for you. Your meal plan may vary depending on factors such as:  The calories you need.  The medicines you take.  Your weight.  Your blood glucose, blood pressure, and cholesterol levels.  Your activity level.  Other health conditions you have, such as heart or kidney disease. How do carbohydrates affect me? Carbohydrates, also called carbs, affect your blood glucose level more than any other type of food. Eating carbs naturally raises the amount of glucose in your blood. Carb counting is a method for keeping track of how many carbs you eat. Counting carbs is important to keep your blood glucose at a healthy level, especially if you use insulin or take certain oral diabetes medicines. It is important to know how many carbs you can safely have in each meal. This is different for every person. Your dietitian can help you calculate how many carbs you should have at each meal and for each snack. How does alcohol affect me? Alcohol can cause a sudden decrease in blood glucose (hypoglycemia), especially if you use insulin or take certain oral diabetes medicines. Hypoglycemia can be a life-threatening condition. Symptoms of hypoglycemia, such as sleepiness, dizziness, and confusion, are similar to symptoms of having too much  alcohol.  Do not drink alcohol if: ? Your health care provider tells you not to drink. ? You are pregnant, may be pregnant, or are planning to become pregnant.  If you drink alcohol: ? Do not drink on an empty stomach. ? Limit how much you use to:  0-1 drink a day for women.  0-2 drinks a day for men. ? Be aware of how much alcohol is in your drink. In the [place] one drink equals one 12 oz bottle of beer (355 mL), one 5 oz glass of wine (148 mL), or one 1 oz glass of hard liquor (44 mL). ? Keep yourself hydrated with water, diet soda, or unsweetened iced tea.  Keep in mind that regular soda, juice, and other mixers may contain a lot of sugar and must be counted as carbs. What are tips for following this plan? Reading food labels  Start by checking the serving size on the "Nutrition Facts" label of packaged foods and drinks. The amount of calories, carbs, fats, and other nutrients listed on the label is based on one serving of the item. Many items contain more than one serving per package.  Check the total grams (g) of carbs in one serving. You can calculate the number of servings of carbs in one serving by dividing the total carbs by 15. For example, if a food has 30 g of total carbs per serving, it would be equal to 2 servings of carbs.  Check the number of grams (g) of saturated fats and trans fats in one serving. Choose foods that have   a low amount or none of these fats.  Check the number of milligrams (mg) of salt (sodium) in one serving. Most people should limit total sodium intake to less than 2,300 mg per day.  Always check the nutrition information of foods labeled as "low-fat" or "nonfat." These foods may be higher in added sugar or refined carbs and should be avoided.  Talk to your dietitian to identify your daily goals for nutrients listed on the label. Shopping  Avoid buying canned, pre-made, or processed foods. These foods tend to be high in fat, sodium, and added  sugar.  Shop around the outside edge of the grocery store. This is where you will most often find fresh fruits and vegetables, bulk grains, fresh meats, and fresh dairy. Cooking  Use low-heat cooking methods, such as baking, instead of high-heat cooking methods like deep frying.  Cook using healthy oils, such as olive, canola, or sunflower oil.  Avoid cooking with butter, cream, or high-fat meats. Meal planning  Eat meals and snacks regularly, preferably at the same times every day. Avoid going long periods of time without eating.  Eat foods that are high in fiber, such as fresh fruits, vegetables, beans, and whole grains. Talk with your dietitian about how many servings of carbs you can eat at each meal.  Eat 4-6 oz (112-168 g) of lean protein each day, such as lean meat, chicken, fish, eggs, or tofu. One ounce (oz) of lean protein is equal to: ? 1 oz (28 g) of meat, chicken, or fish. ? 1 egg. ?  cup (62 g) of tofu.  Eat some foods each day that contain healthy fats, such as avocado, nuts, seeds, and fish.   What foods should I eat? Fruits Berries. Apples. Oranges. Peaches. Apricots. Plums. Grapes. Mango. Papaya. Pomegranate. Kiwi. Cherries. Vegetables Lettuce. Spinach. Leafy greens, including kale, chard, collard greens, and mustard greens. Beets. Cauliflower. Cabbage. Broccoli. Carrots. Green beans. Tomatoes. Peppers. Onions. Cucumbers. Brussels sprouts. Grains Whole grains, such as whole-wheat or whole-grain bread, crackers, tortillas, cereal, and pasta. Unsweetened oatmeal. Quinoa. Brown or wild rice. Meats and other proteins Seafood. Poultry without skin. Lean cuts of poultry and beef. Tofu. Nuts. Seeds. Dairy Low-fat or fat-free dairy products such as milk, yogurt, and cheese. The items listed above may not be a complete list of foods and beverages you can eat. Contact a dietitian for more information. What foods should I avoid? Fruits Fruits canned with  syrup. Vegetables Canned vegetables. Frozen vegetables with butter or cream sauce. Grains Refined white flour and flour products such as bread, pasta, snack foods, and cereals. Avoid all processed foods. Meats and other proteins Fatty cuts of meat. Poultry with skin. Breaded or fried meats. Processed meat. Avoid saturated fats. Dairy Full-fat yogurt, cheese, or milk. Beverages Sweetened drinks, such as soda or iced tea. The items listed above may not be a complete list of foods and beverages you should avoid. Contact a dietitian for more information. Questions to ask a health care provider  Do I need to meet with a diabetes educator?  Do I need to meet with a dietitian?  What number can I call if I have questions?  When are the best times to check my blood glucose? Where to find more information:  American Diabetes Association: diabetes.org  Academy of Nutrition and Dietetics: www.eatright.org  National Institute of Diabetes and Digestive and Kidney Diseases: www.niddk.nih.gov  Association of Diabetes Care and Education Specialists: www.diabeteseducator.org Summary  It is important to have healthy eating   habits because your blood sugar (glucose) levels are greatly affected by what you eat and drink.  A healthy meal plan will help you control your blood glucose and maintain a healthy lifestyle.  Your health care provider may recommend that you work with a dietitian to make a meal plan that is best for you.  Keep in mind that carbohydrates (carbs) and alcohol have immediate effects on your blood glucose levels. It is important to count carbs and to use alcohol carefully. This information is not intended to replace advice given to you by your health care provider. Make sure you discuss any questions you have with your health care provider. Document Revised: 07/11/2019 Document Reviewed: 07/11/2019 Elsevier Patient Education  2021 [place] Maintenance,  Female Adopting a healthy lifestyle and getting preventive care are important in promoting health and wellness. Ask your health care provider about:  The right schedule for you to have regular tests and exams.  Things you can do on your own to prevent diseases and keep yourself healthy. What should I know about diet, weight, and exercise? Eat a healthy diet  Eat a diet that includes plenty of vegetables, fruits, low-fat dairy products, and lean protein.  Do not eat a lot of foods that are high in solid fats, added sugars, or sodium.   Maintain a healthy weight Body mass index (BMI) is used to identify weight problems. It estimates body fat based on height and weight. Your health care provider can help determine your BMI and help you achieve or maintain a healthy weight. Get regular exercise Get regular exercise. This is one of the most important things you can do for your health. Most adults should:  Exercise for at least 150 minutes each week. The exercise should increase your heart rate and make you sweat (moderate-intensity exercise).  Do strengthening exercises at least twice a week. This is in addition to the moderate-intensity exercise.  Spend less time sitting. Even light physical activity can be beneficial. Watch cholesterol and blood lipids Have your blood tested for lipids and cholesterol at 44 years of age, then have this test every 5 years. Have your cholesterol levels checked more often if:  Your lipid or cholesterol levels are high.  You are older than 43 years of age.  You are at high risk for heart disease. What should I know about cancer screening? Depending on your health history and family history, you may need to have cancer screening at various ages. This may include screening for:  Breast cancer.  Cervical cancer.  Colorectal cancer.  Skin cancer.  Lung cancer. What should I know about heart disease, diabetes, and high blood pressure? Blood pressure  and heart disease  High blood pressure causes heart disease and increases the risk of stroke. This is more likely to develop in people who have high blood pressure readings, are of African descent, or are overweight.  Have your blood pressure checked: ? Every 3-5 years if you are 48-76 years of age. ? Every year if you are 51 years old or older. Diabetes Have regular diabetes screenings. This checks your fasting blood sugar level. Have the screening done:  Once every three years after age 13 if you are at a normal weight and have a low risk for diabetes.  More often and at a younger age if you are overweight or have a high risk for diabetes. What should I know about preventing infection? Hepatitis B If you have a higher risk for hepatitis  B, you should be screened for this virus. Talk with your health care provider to find out if you are at risk for hepatitis B infection. Hepatitis C Testing is recommended for:  Everyone born from [birth year] through 1963.  Anyone with known risk factors for hepatitis C. Sexually transmitted infections (STIs)  Get screened for STIs, including gonorrhea and chlamydia, if: ? You are sexually active and are younger than 43 years of age. ? You are older than 43 years of age and your health care provider tells you that you are at risk for this type of infection. ? Your sexual activity has changed since you were last screened, and you are at increased risk for chlamydia or gonorrhea. Ask your health care provider if you are at risk.  Ask your health care provider about whether you are at high risk for HIV. Your health care provider may recommend a prescription medicine to help prevent HIV infection. If you choose to take medicine to prevent HIV, you should first get tested for HIV. You should then be tested every 3 months for as long as you are taking the medicine. Pregnancy  If you are about to stop having your period (premenopausal) and you may become pregnant,  seek counseling before you get pregnant.  Take 400 to 800 micrograms (mcg) of folic acid every day if you become pregnant.  Ask for birth control (contraception) if you want to prevent pregnancy. Osteoporosis and menopause Osteoporosis is a disease in which the bones lose minerals and strength with aging. This can result in bone fractures. If you are 68 years old or older, or if you are at risk for osteoporosis and fractures, ask your health care provider if you should:  Be screened for bone loss.  Take a calcium or vitamin D supplement to lower your risk of fractures.  Be given hormone replacement therapy (HRT) to treat symptoms of menopause. Follow these instructions at home: Lifestyle  Do not use any products that contain nicotine or tobacco, such as cigarettes, e-cigarettes, and chewing tobacco. If you need help quitting, ask your health care provider.  Do not use street drugs.  Do not share needles.  Ask your health care provider for help if you need support or information about quitting drugs. Alcohol use  Do not drink alcohol if: ? Your health care provider tells you not to drink. ? You are pregnant, may be pregnant, or are planning to become pregnant.  If you drink alcohol: ? Limit how much you use to 0-1 drink a day. ? Limit intake if you are breastfeeding.  Be aware of how much alcohol is in your drink. In the [place] one drink equals one 12 oz bottle of beer (355 mL), one 5 oz glass of wine (148 mL), or one 1 oz glass of hard liquor (44 mL). General instructions  Schedule regular health, dental, and eye exams.  Stay current with your vaccines.  Tell your health care provider if: ? You often feel depressed. ? You have ever been abused or do not feel safe at home. Summary  Adopting a healthy lifestyle and getting preventive care are important in promoting health and wellness.  Follow your health care provider's instructions about healthy diet, exercising, and  getting tested or screened for diseases.  Follow your health care provider's instructions on monitoring your cholesterol and blood pressure. This information is not intended to replace advice given to you by your health care provider. Make sure you discuss any questions you  have with your health care provider. Document Revised: 07/27/2018 Document Reviewed: 07/27/2018 Elsevier Patient Education  2021 [employer].

## 2020-12-12 NOTE — Progress Notes (Signed)
[address] [place], [place]  [address] Phone:  [phone]   Fax:  [phone]   New Patient Office Visit  Subjective:  Patient ID: Beverly Johnson, female    DOB: Mar 03, 1976  Age: 45 y.o. MRN: [id number]  CC:  Chief Complaint  Patient presents with  . New Patient (Initial Visit)    Est care , new patient , hx of diabetes     HPI Beverly Johnson presents for establish care. She has been lost to follow up at another practice. She  has a past medical history of Diabetes mellitus without complication ([place]), Gallstones (03/10/2011), HIV infection ([place]), and Immune deficiency disorder ([place]).   Diabetes Mellitus Patient presents for follow up of diabetes. Current symptoms include: hyperglycemia and recent infection. She had some inflammation in her vaginal area and was treated. She has a callus to the ball of the foot. She was seen at podiatry and no treatment was provided due to the elevated A1c. She was seen in the ER for and A1c check. No additional treatment was provided. Symptoms have stabilized. Patient denies increased appetite, nausea, paresthesia of the feet, polydipsia, polyuria, visual disturbances, vomiting and weight loss. Evaluation to date has included: fasting blood sugar, fasting lipid panel and hemoglobin A1C.  Home sugars: BGs are high in the morning, BGs are high in the evening, BGs are high around dinner, BGs are high at bedtime. Current treatment: Continued metformin which has been ineffective and Continued Sitagliptin which has been ineffective. Last dilated eye exam: several years.   Past Medical History:  Diagnosis Date  . Diabetes mellitus without complication ([place])   . Gallstones 03/10/2011  . HIV infection ([place])   . Immune deficiency disorder [facility])     Past Surgical History:  Procedure Laterality Date  . CHOLECYSTECTOMY  06/29/2011   Procedure: LAPAROSCOPIC CHOLECYSTECTOMY WITH INTRAOPERATIVE CHOLANGIOGRAM;  Surgeon: Merrie Roof, MD;  Location: [facility];  Service: General;  Laterality: N/A;  . NO PAST SURGERIES      Family History  Problem Relation Age of Onset  . Healthy Mother   . Healthy Father     Social History   Socioeconomic History  . Marital status: Married    Spouse name: Not on file  . Number of children: Not on file  . Years of education: 63  . Highest education level: Not on file  Occupational History    Employer: UNEMPLOYED  Tobacco Use  . Smoking status: Never Smoker  . Smokeless tobacco: Never Used  Vaping Use  . Vaping Use: Never used  Substance and Sexual Activity  . Alcohol use: No  . Drug use: Yes    Types: Marijuana    Comment: CBD  . Sexual activity: Yes    Partners: Male    Comment: pt. declined condoms  Other Topics Concern  . Not on file  Social History Narrative  . Not on file   Social Determinants of Health   Financial Resource Strain: Not on file  Food Insecurity: Not on file  Transportation Needs: Not on file  Physical Activity: Not on file  Stress: Not on file  Social Connections: Not on file  Intimate Partner Violence: Not on file    ROS Review of Systems  Objective:   Today's Vitals: BP 120/75 (BP Location: Left Arm, Patient Position: Sitting, Cuff Size: Normal)   Pulse 89   Temp 98.4 F (36.9 C) (Temporal)   Ht 5' 7"  (  1.702 m)   Wt 257 lb (116.6 kg)   LMP 11/26/2020   SpO2 99%   BMI 40.25 kg/m   Physical Exam Constitutional:      Appearance: She is obese. She is not ill-appearing, toxic-appearing or diaphoretic.  HENT:     Head: Normocephalic and atraumatic.  Cardiovascular:     Rate and Rhythm: Normal rate and regular rhythm.     Pulses: Normal pulses.     Heart sounds: Normal heart sounds.  Pulmonary:     Effort: Pulmonary effort is normal.     Breath sounds: Normal breath sounds.  Abdominal:     General: Bowel sounds are normal.     Palpations: Abdomen is soft.     Comments: Increased abdominal girth   Musculoskeletal:         General: Normal range of motion.     Cervical back: Normal range of motion.  Skin:    General: Skin is warm and dry.     Capillary Refill: Capillary refill takes less than 2 seconds.  Neurological:     General: No focal deficit present.     Mental Status: She is alert and oriented to person, place, and time.  Psychiatric:        Mood and Affect: Mood normal.        Behavior: Behavior normal.        Thought Content: Thought content normal.        Judgment: Judgment normal.     Assessment & Plan:   Problem List Items Addressed This Visit      Endocrine   Uncontrolled type 2 diabetes mellitus (HCC) (Chronic) Encourage compliance with current treatment regimen  Dose adjustment Glipizide 10 mg  Encourage regular CBG monitoring Encourage contacting office if excessive hyperglycemia and or hypoglycemia Lifestyle modification with healthy diet (fewer calories, more high fiber foods, whole grains and non-starchy vegetables, lower fat meat and fish, low-fat diary include healthy oils) regular exercise (physical activity) and weight loss Opthalmology exam discussed  Nutritional consult recommended Regular dental visits encouraged Home BP monitoring also encouraged goal <130/80     Relevant Medications   glipiZIDE (GLUCOTROL) 10 MG tablet   Other Relevant Orders   Microalbumin, urine    Other Visit Diagnoses    Encounter to establish care    -  Primary Discussed female health maintenance; SBE, annual CBE, PAP test Discussed general safety in vehicle and COVID Discussed regular hydration with water Discussed healthy diet and exercise and weight management Discussed sexual health  Discussed mental health Encouraged to call our office for an appointment with in ongoing concerns for questions.     Morbid obesity with BMI of 40.0-44.9, adult ([place])     Obesity with BMI and comorbidities as noted above.  Discussed proper diet (low fat, low sodium, high fiber) with patient.    Discussed need for regular exercise (3 times per week, 20 minutes per session) with patient.    Relevant Medications   glipiZIDE (GLUCOTROL) 10 MG tablet      Outpatient Encounter Medications as of 12/12/2020  Medication Sig  . GENVOYA 150-150-200-10 MG TABS tablet TAKE 1 TABLET BY MOUTH DAILY WITH BREAKFAST. (Patient taking differently: Take 1 tablet by mouth daily with breakfast.)  . glipiZIDE (GLUCOTROL) 10 MG tablet Take 1 tablet (10 mg total) by mouth 2 (two) times daily before a meal.  . glucose blood test strip Check your sugar in the morning before you eat breakfast, and one hour after a  meal.  . glucose monitoring kit (FREESTYLE) monitoring kit 1 each by Does not apply route daily. Check glucose once in the morning before breakfast and 1 hour after a meal  . metFORMIN (GLUCOPHAGE) 500 MG tablet Take 2 tablets (1,000 mg total) by mouth 2 (two) times daily with a meal.  . sitaGLIPtin (JANUVIA) 100 MG tablet Take 1 tablet (100 mg total) by mouth daily.  . [DISCONTINUED] cetirizine (ZYRTEC ALLERGY) 10 MG tablet Take 1 tablet (10 mg total) by mouth daily. (Patient not taking: No sig reported)   No facility-administered encounter medications on file as of 12/12/2020.    Follow-up: Return in about 6 weeks (around 01/23/2021) for follow up DM [id number] with fasting labs.   Vevelyn Francois, NP

## 2020-12-13 LAB — MICROALBUMIN, URINE: Microalbumin, Urine: 18 ug/mL

## 2021-01-16 ENCOUNTER — Other Ambulatory Visit: Payer: Self-pay

## 2021-01-16 ENCOUNTER — Other Ambulatory Visit (HOSPITAL_COMMUNITY)
Admission: RE | Admit: 2021-01-16 | Discharge: 2021-01-16 | Disposition: A | Discharge status: Home / Self Care | Payer: Medicaid Other | Source: Ambulatory Visit | Attending: Obstetrics and Gynecology | Admitting: Obstetrics and Gynecology

## 2021-01-16 ENCOUNTER — Ambulatory Visit (INDEPENDENT_AMBULATORY_CARE_PROVIDER_SITE_OTHER): Payer: Medicaid Other | Admitting: Obstetrics and Gynecology

## 2021-01-16 ENCOUNTER — Encounter: Payer: Self-pay | Admitting: Obstetrics and Gynecology

## 2021-01-16 VITALS — BP 138/99 | HR 81 | Wt 259.4 lb

## 2021-01-16 DIAGNOSIS — Z1231 Encounter for screening mammogram for malignant neoplasm of breast: Secondary | ICD-10-CM

## 2021-01-16 DIAGNOSIS — N920 Excessive and frequent menstruation with regular cycle: Secondary | ICD-10-CM | POA: Insufficient documentation

## 2021-01-16 DIAGNOSIS — N946 Dysmenorrhea, unspecified: Secondary | ICD-10-CM | POA: Diagnosis not present

## 2021-01-16 DIAGNOSIS — B373 Candidiasis of vulva and vagina: Secondary | ICD-10-CM | POA: Diagnosis not present

## 2021-01-16 DIAGNOSIS — B3731 Acute candidiasis of vulva and vagina: Secondary | ICD-10-CM

## 2021-01-16 MED ORDER — TRANEXAMIC ACID 650 MG PO TABS: 1300.0000 mg | ORAL_TABLET | Freq: Three times a day (TID) | ORAL | 2 refills | Status: DC

## 2021-01-16 MED ORDER — MICONAZOLE NITRATE 2 % VA CREA: 1.0000 | TOPICAL_CREAM | Freq: Every day | VAGINAL | 2 refills | Status: DC

## 2021-01-16 NOTE — Progress Notes (Signed)
Obstetrics and Gynecology New Patient Evaluation  Appointment Date: 01/16/2021  OBGYN Clinic: [facility]   Primary Care Provider: Dionisio David, NP   Referring Provider: Thayer Headings, MD  Chief Complaint: Heavy, painful periods  History of Present Illness: Beverly Johnson is a 45 y.o. African-American G3P0 (No LMP recorded (approximate).), seen for the above chief complaint. Her past medical history is significant for HIV, HTN, DM2, BMI 40s, h/o c/s x 3 with BTL  Patient with s/s for last past several years. Has never tried anything for it or anything for birth control aside from [place].     Review of Systems: Pertinent items noted in HPI and remainder of comprehensive ROS otherwise negative.    Patient Active Problem List   Diagnosis Date Noted  . Callus of foot 11/26/2020  . Heavy menses 11/26/2020  . History of 2019 novel coronavirus disease (COVID-19) 06/16/2019  . Abnormal ECG 06/16/2019  . Medication monitoring encounter 11/12/2017  . Breast nodule 09/02/2017  . Screening examination for venereal disease 03/30/2017  . Encounter for long-term (current) use of high-risk medication 03/30/2017  . Microalbuminuria due to type 2 diabetes mellitus ([place]) 03/30/2017  . Knee pain, chronic 03/30/2017  . Vitamin D deficiency 03/21/2017  . Healthcare maintenance 03/02/2014  . Morbid obesity with BMI of 45.0-49.9, adult ([place]) 04/27/2012  . Uncontrolled type 2 diabetes mellitus ([place]) 12/08/2010  . Human immunodeficiency virus (HIV) disease ([place]) 09/29/2006    Past Medical History:  Past Medical History:  Diagnosis Date  . DM2 (diabetes mellitus, type 2) ([place])   . Gallstones 03/10/2011  . HIV infection ([place])   . Immune deficiency disorder [facility])     Past Surgical History:  Past Surgical History:  Procedure Laterality Date  . CESAREAN SECTION     x3  . CHOLECYSTECTOMY  06/29/2011   Procedure: LAPAROSCOPIC CHOLECYSTECTOMY WITH  INTRAOPERATIVE CHOLANGIOGRAM;  Surgeon: Merrie Roof, MD;  Location: [facility];  Service: General;  Laterality: N/A;  . TUBAL LIGATION      Past Obstetrical History:  OB History  Gravida Para Term Preterm AB Living  3         3  SAB IAB Ectopic Multiple Live Births          3    # Outcome Date GA Lbr Len/2nd Weight Sex Delivery Anes PTL Lv  3 Gravida           2 Gravida           1 Gravida             Obstetric Comments  c-section x 3. Last one with BTL done in the mid 2000s    Past Gynecological History: As per HPI. Menarche age 15 Periods: qmonth, regular, a week, heavy and painful, no intermenstrual bleeding History of Pap Smear(s): Yes.   Last pap unknown  Social History:  Social History   Socioeconomic History  . Marital status: Married    Spouse name: Not on file  . Number of children: Not on file  . Years of education: 15  . Highest education level: Not on file  Occupational History    Employer: UNEMPLOYED  Tobacco Use  . Smoking status: Never Smoker  . Smokeless tobacco: Never Used  Vaping Use  . Vaping Use: Never used  Substance and Sexual Activity  . Alcohol use: No  . Drug use: Yes    Types: Marijuana    Comment: CBD  . Sexual  activity: Yes    Partners: Male    Comment: pt. declined condoms  Other Topics Concern  . Not on file  Social History Narrative  . Not on file   Social Determinants of Health   Financial Resource Strain: Not on file  Food Insecurity: No Food Insecurity  . Worried About Charity fundraiser in the Last Year: Never true  . Ran Out of Food in the Last Year: Never true  Transportation Needs: No Transportation Needs  . Lack of Transportation (Medical): No  . Lack of Transportation (Non-Medical): No  Physical Activity: Not on file  Stress: Not on file  Social Connections: Not on file  Intimate Partner Violence: Not on file    Family History:  Family History  Problem Relation Age of Onset  . Healthy Mother   . Healthy  Father    She denies any female cancers, bleeding or blood clotting disorders.   Medications [name] had no medications administered during this visit. Current Outpatient Medications  Medication Sig Dispense Refill  . GENVOYA 150-150-200-10 MG TABS tablet TAKE 1 TABLET BY MOUTH DAILY WITH BREAKFAST. (Patient taking differently: Take 1 tablet by mouth daily with breakfast.) 30 tablet 3  . glipiZIDE (GLUCOTROL) 10 MG tablet Take 1 tablet (10 mg total) by mouth 2 (two) times daily before a meal. 60 tablet 3  . glucose blood test strip Check your sugar in the morning before you eat breakfast, and one hour after a meal. 100 each 2  . glucose monitoring kit (FREESTYLE) monitoring kit 1 each by Does not apply route daily. Check glucose once in the morning before breakfast and 1 hour after a meal 1 each 0  . metFORMIN (GLUCOPHAGE) 500 MG tablet Take 2 tablets (1,000 mg total) by mouth 2 (two) times daily with a meal. 120 tablet 11  . miconazole (MONISTAT 7) 2 % vaginal cream Place 1 Applicatorful vaginally at bedtime. Apply for seven nights and 2-3 times a week thereafter 30 g 2  . sitaGLIPtin (JANUVIA) 100 MG tablet Take 1 tablet (100 mg total) by mouth daily. 30 tablet 11  . tranexamic acid (LYSTEDA) 650 MG TABS tablet Take 2 tablets (1,300 mg total) by mouth 3 (three) times daily. Take during menses for a maximum of five days 30 tablet 2   No current facility-administered medications for this visit.    Allergies Patient has no known allergies.   Physical Exam:  BP (!) 138/99   Pulse 81   Wt 259 lb 6.4 oz (117.7 kg)   LMP  (Approximate)   BMI 40.63 kg/m  Body mass index is 40.63 kg/m. General appearance: Well nourished, well developed female in no acute distress.  Neck:  Supple, normal appearance, and no thyromegaly  Cardiovascular: normal s1 and s2.  No murmurs, rubs or gallops. Respiratory:  Clear to auscultation bilateral. Normal respiratory effort Abdomen: positive bowel  sounds and no masses, hernias; diffusely non tender to palpation, non distended Breasts: breasts appear normal, no suspicious masses, no skin or nipple changes or axillary nodes, and normal palpation. Neuro/Psych:  Normal mood and affect.  Skin:  Warm and dry.  Lymphatic:  No inguinal lymphadenopathy.   Pelvic exam: is limited by body habitus EGBUS: within normal limits except for mild bilateral erythema Vagina: within normal limits and with no blood in the vault. +white cottage cheese like d/c in the vault Cervix: normal appearing cervix without tenderness, discharge or lesions. Uterus:  nonenlarged and non tender Adnexa:  normal adnexa and no mass, fullness, tenderness Rectovaginal: deferred  Laboratory: 4/24 a1c 13.5 CMP Latest Ref Rng & Units 11/05/2020 10/21/2020 06/18/2020  Glucose 65 - 99 mg/dL 287(H) 454(H) 346(H)  BUN 7 - 25 mg/dL 7 12 8   Creatinine 0.50 - 1.10 mg/dL 0.64 0.73 0.64  Sodium 135 - 146 mmol/L 137 130(L) 133(L)  Potassium 3.5 - 5.3 mmol/L 4.4 3.5 3.5  Chloride 98 - 110 mmol/L 105 98 102  CO2 20 - 32 mmol/L 25 22 22   Calcium 8.6 - 10.2 mg/dL 8.9 8.6(L) 9.1  Total Protein 6.1 - 8.1 g/dL 7.3 8.0 -  Total Bilirubin 0.2 - 1.2 mg/dL 0.3 0.5 -  Alkaline Phos 38 - 126 U/L - 173(H) -  AST 10 - 30 U/L 17 19 -  ALT 6 - 29 U/L 11 16 -   CBC Latest Ref Rng & Units 11/05/2020 10/21/2020 06/18/2020  WBC 3.8 - 10.8 Thousand/uL 3.1(L) 6.1 4.9  Hemoglobin 11.7 - 15.5 g/dL 9.6(L) 9.5(L) 10.0(L)  Hematocrit 35.0 - 45.0 % 33.4(L) 32.1(L) 35.6(L)  Platelets 140 - 400 Thousand/uL 192 180 219   Radiology: none  Assessment: pt stable  Plan:  1. Dysmenorrhea Patient smear updated today. I d/w her need for u/s and potential medical and surgical options but a1c would need to be improved before consideration for surgery. In the interim, recommend a trial of Lysteda after r/b/a d/w her and she is amenable to this.  - Cytology - PAP( [facility]) - US PELVIC COMPLETE WITH TRANSVAGINAL;  Future  2. Menorrhagia with regular cycle - Cytology - PAP( [place]) - US PELVIC COMPLETE WITH TRANSVAGINAL; Future  3. Encounter for screening mammogram for malignant neoplasm of breast - MM Digital Screening; Future  4. Vulvovaginal candidiasis Monistat 7 and then 2-3x/wk thereafter. Good blood sugar control d/w her.   Orders Placed This Encounter  Procedures  . MM Digital Screening  . US PELVIC COMPLETE WITH TRANSVAGINAL    RTC after u/s.   Durene Romans MD Attending [facility] for [employer] [facility])

## 2021-01-21 LAB — CYTOLOGY - PAP
Adequacy: ABSENT
Chlamydia: NEGATIVE
Comment: NEGATIVE
Comment: NEGATIVE
Comment: NEGATIVE
Comment: NORMAL
Diagnosis: NEGATIVE
High risk HPV: NEGATIVE
Neisseria Gonorrhea: NEGATIVE
Trichomonas: NEGATIVE

## 2021-01-22 ENCOUNTER — Emergency Department (HOSPITAL_COMMUNITY)
Admission: EM | Admit: 2021-01-22 | Discharge: 2021-01-22 | Discharge status: Against Medical Advice | Payer: Medicaid Other | Attending: Emergency Medicine | Admitting: Emergency Medicine

## 2021-01-22 ENCOUNTER — Emergency Department (HOSPITAL_COMMUNITY): Payer: Medicaid Other

## 2021-01-22 ENCOUNTER — Other Ambulatory Visit: Payer: Self-pay

## 2021-01-22 ENCOUNTER — Encounter (HOSPITAL_COMMUNITY): Payer: Self-pay

## 2021-01-22 DIAGNOSIS — R0902 Hypoxemia: Secondary | ICD-10-CM | POA: Diagnosis not present

## 2021-01-22 DIAGNOSIS — R0789 Other chest pain: Secondary | ICD-10-CM | POA: Diagnosis not present

## 2021-01-22 DIAGNOSIS — E1165 Type 2 diabetes mellitus with hyperglycemia: Secondary | ICD-10-CM | POA: Diagnosis not present

## 2021-01-22 DIAGNOSIS — R1084 Generalized abdominal pain: Secondary | ICD-10-CM | POA: Diagnosis not present

## 2021-01-22 DIAGNOSIS — Z5321 Procedure and treatment not carried out due to patient leaving prior to being seen by health care provider: Secondary | ICD-10-CM | POA: Diagnosis not present

## 2021-01-22 DIAGNOSIS — R079 Chest pain, unspecified: Secondary | ICD-10-CM | POA: Diagnosis not present

## 2021-01-22 LAB — TROPONIN I (HIGH SENSITIVITY)
Troponin I (High Sensitivity): 4 ng/L (ref <18)
Troponin I (High Sensitivity): 4 ng/L (ref <18)

## 2021-01-22 LAB — BASIC METABOLIC PANEL
Anion gap: 8 (ref 5–15)
BUN: 8 mg/dL (ref 6–20)
CO2: 23 mmol/L (ref 22–32)
Calcium: 8.8 mg/dL — ABNORMAL LOW (ref 8.9–10.3)
Chloride: 104 mmol/L (ref 98–111)
Creatinine, Ser: 0.71 mg/dL (ref 0.44–1.00)
GFR, Estimated: >60 mL/min (ref >60)
Glucose, Bld: 219 mg/dL — ABNORMAL HIGH (ref 70–99)
Potassium: 3.7 mmol/L (ref 3.5–5.1)
Sodium: 135 mmol/L (ref 135–145)

## 2021-01-22 LAB — CBC WITH DIFFERENTIAL/PLATELET
Abs Immature Granulocytes: 0.01 10*3/uL (ref 0.00–0.07)
Basophils Absolute: 0 10*3/uL (ref 0.0–0.1)
Basophils Relative: 1 %
Eosinophils Absolute: 0 10*3/uL (ref 0.0–0.5)
Eosinophils Relative: 1 %
HCT: 32.5 % — ABNORMAL LOW (ref 36.0–46.0)
Hemoglobin: 9.3 g/dL — ABNORMAL LOW (ref 12.0–15.0)
Immature Granulocytes: 0 %
Lymphocytes Relative: 40 %
Lymphs Abs: 1.6 10*3/uL (ref 0.7–4.0)
MCH: 20.9 pg — ABNORMAL LOW (ref 26.0–34.0)
MCHC: 28.6 g/dL — ABNORMAL LOW (ref 30.0–36.0)
MCV: 72.9 fL — ABNORMAL LOW (ref 80.0–100.0)
Monocytes Absolute: 0.4 10*3/uL (ref 0.1–1.0)
Monocytes Relative: 10 %
Neutro Abs: 2 10*3/uL (ref 1.7–7.7)
Neutrophils Relative %: 48 %
Platelets: 196 10*3/uL (ref 150–400)
RBC: 4.46 MIL/uL (ref 3.87–5.11)
RDW: 15.1 % (ref 11.5–15.5)
WBC: 4.1 10*3/uL (ref 4.0–10.5)
nRBC: 0 % (ref 0.0–0.2)

## 2021-01-22 LAB — I-STAT BETA HCG BLOOD, ED (MC, WL, AP ONLY): I-stat hCG, quantitative: <5 m[IU]/mL (ref <5)

## 2021-01-22 NOTE — ED Triage Notes (Signed)
Patient arrived by [facility] from work complaining of chest wall pain x 3 days. Pain with movement and inspiration. Denies cough, no congestion. NAD

## 2021-01-22 NOTE — ED Provider Notes (Signed)
Emergency Medicine Provider Triage Evaluation Note  Beverly Johnson , a 45 y.o. female  was evaluated in triage.  Pt complains of chest pain.  Review of Systems  Positive: Chest pain Negative: fever  Physical Exam  There were no vitals taken for this visit. Gen:   Awake, no distress   Resp:  Normal effort  MSK:   Moves extremities without difficulty  Other:  Lungs ctab, heart with rrr  Medical Decision Making  Medically screening exam initiated at 4:19 PM.  Appropriate orders placed.  Beverly Johnson was informed that the remainder of the evaluation will be completed by another provider, this initial triage assessment does not replace that evaluation, and the importance of remaining in the ED until their evaluation is complete.     Beverly Booze, PA-C 01/22/21 1622    Beverly Saver, MD 01/24/21 [phone]

## 2021-01-22 NOTE — ED Notes (Signed)
Beverly Johnson got pt blood

## 2021-01-22 NOTE — ED Notes (Signed)
Patient called x3 with no response and not visible in lobby

## 2021-01-23 ENCOUNTER — Encounter: Payer: Self-pay | Admitting: Nurse Practitioner

## 2021-01-23 ENCOUNTER — Telehealth (INDEPENDENT_AMBULATORY_CARE_PROVIDER_SITE_OTHER): Payer: Medicaid Other | Admitting: Nurse Practitioner

## 2021-01-23 DIAGNOSIS — E1165 Type 2 diabetes mellitus with hyperglycemia: Secondary | ICD-10-CM

## 2021-01-23 NOTE — Progress Notes (Signed)
   [place], [place]  [address] Phone:  [phone]   Fax:  [phone] Virtual Visit via Video Note  I connected with Beverly Johnson on 01/23/21 at  8:00 AM EDT by video and verified that I am speaking with the correct person using two identifiers.   I discussed the limitations, risks, security and privacy concerns of performing an evaluation and management service by tvideo and the availability of in person appointments. I also discussed with the patient that there may be a patient responsible charge related to this service. The patient expressed understanding and agreed to proceed.  Patient home Provider Office  History of Present Illness: She reports being seen in the ED yesterday for chest pain. This has resolved. She continues to have anemia H&H 9.3 and 32.5. CBG 219 Troponin 4.  Denies headache, dizziness, visual changes, shortness of breath, dyspnea on exertion, chest pain, nausea, vomiting or any edema.   Diabetes Mellitus Patient presents for follow up of diabetes. Current symptoms include: hyperglycemia. Symptoms have gradually worsened. Patient denies foot ulcerations, hypoglycemia , increased appetite, nausea, paresthesia of the feet, polydipsia, polyuria, visual disturbances, vomiting, and weight loss. Evaluation to date has included: fasting blood sugar and hemoglobin A1C.  Home sugars: BGs are high in the evening. Current treatment: more intensive attention to diet which has been not very effective, Continued sulfonylurea which has been not very effective, Continued metformin which has been not very effective, and Continued [place] which has been not very effective. Last dilated eye exam: TBS.   Observations/Objective: Virutal visit no exam  Pt in no acute distress  Assessment and Plan: Assessment  Primary Diagnosis & Pertinent Problem List: The encounter diagnosis was Uncontrolled type 2 diabetes mellitus with hyperglycemia  ([place]).  Visit Diagnosis: 1. Uncontrolled type 2 diabetes mellitus with hyperglycemia ([facility])  Persistent  Encourage compliance with current treatment regimen  Encourage regular CBG monitoring Encourage contacting office if excessive hyperglycemia and or hypoglycemia Lifestyle modification with healthy diet (fewer calories, more high fiber foods, whole grains and non-starchy vegetables, lower fat meat and fish, low-fat diary include healthy oils) regular exercise (physical activity) and weight loss Opthalmology exam discussed  Nutritional discussed works in [employer]        Follow Up Instructions:    I discussed the assessment and treatment plan with the patient. The patient was provided an opportunity to ask questions and all were answered. The patient agreed with the plan and demonstrated an understanding of the instructions.   The patient was advised to call back or seek an in-person evaluation if the symptoms worsen or if the condition fails to improve as anticipated.  I provided 9 minutes of video- visit time during this encounter.   Beverly Francois, NP

## 2021-01-23 NOTE — Patient Instructions (Signed)
Diabetes Mellitus and Nutrition, Adult When you have diabetes, or diabetes mellitus, it is very important to have healthy eating habits because your blood sugar (glucose) levels are greatly affected by what you eat and drink. Eating healthy foods in the right amounts, at about the same times every day, can help you:  Control your blood glucose.  Lower your risk of heart disease.  Improve your blood pressure.  Reach or maintain a healthy weight. What can affect my meal plan? Every person with diabetes is different, and each person has different needs for a meal plan. Your health care provider may recommend that you work with a dietitian to make a meal plan that is best for you. Your meal plan may vary depending on factors such as:  The calories you need.  The medicines you take.  Your weight.  Your blood glucose, blood pressure, and cholesterol levels.  Your activity level.  Other health conditions you have, such as heart or kidney disease. How do carbohydrates affect me? Carbohydrates, also called carbs, affect your blood glucose level more than any other type of food. Eating carbs naturally raises the amount of glucose in your blood. Carb counting is a method for keeping track of how many carbs you eat. Counting carbs is important to keep your blood glucose at a healthy level, especially if you use insulin or take certain oral diabetes medicines. It is important to know how many carbs you can safely have in each meal. This is different for every person. Your dietitian can help you calculate how many carbs you should have at each meal and for each snack. How does alcohol affect me? Alcohol can cause a sudden decrease in blood glucose (hypoglycemia), especially if you use insulin or take certain oral diabetes medicines. Hypoglycemia can be a life-threatening condition. Symptoms of hypoglycemia, such as sleepiness, dizziness, and confusion, are similar to symptoms of having too much  alcohol.  Do not drink alcohol if: ? Your health care provider tells you not to drink. ? You are pregnant, may be pregnant, or are planning to become pregnant.  If you drink alcohol: ? Do not drink on an empty stomach. ? Limit how much you use to:  0-1 drink a day for women.  0-2 drinks a day for men. ? Be aware of how much alcohol is in your drink. In the [place] one drink equals one 12 oz bottle of beer (355 mL), one 5 oz glass of wine (148 mL), or one 1 oz glass of hard liquor (44 mL). ? Keep yourself hydrated with water, diet soda, or unsweetened iced tea.  Keep in mind that regular soda, juice, and other mixers may contain a lot of sugar and must be counted as carbs. What are tips for following this plan? Reading food labels  Start by checking the serving size on the "Nutrition Facts" label of packaged foods and drinks. The amount of calories, carbs, fats, and other nutrients listed on the label is based on one serving of the item. Many items contain more than one serving per package.  Check the total grams (g) of carbs in one serving. You can calculate the number of servings of carbs in one serving by dividing the total carbs by 15. For example, if a food has 30 g of total carbs per serving, it would be equal to 2 servings of carbs.  Check the number of grams (g) of saturated fats and trans fats in one serving. Choose foods that have   a low amount or none of these fats.  Check the number of milligrams (mg) of salt (sodium) in one serving. Most people should limit total sodium intake to less than 2,300 mg per day.  Always check the nutrition information of foods labeled as "low-fat" or "nonfat." These foods may be higher in added sugar or refined carbs and should be avoided.  Talk to your dietitian to identify your daily goals for nutrients listed on the label. Shopping  Avoid buying canned, pre-made, or processed foods. These foods tend to be high in fat, sodium, and added  sugar.  Shop around the outside edge of the grocery store. This is where you will most often find fresh fruits and vegetables, bulk grains, fresh meats, and fresh dairy. Cooking  Use low-heat cooking methods, such as baking, instead of high-heat cooking methods like deep frying.  Cook using healthy oils, such as olive, canola, or sunflower oil.  Avoid cooking with butter, cream, or high-fat meats. Meal planning  Eat meals and snacks regularly, preferably at the same times every day. Avoid going long periods of time without eating.  Eat foods that are high in fiber, such as fresh fruits, vegetables, beans, and whole grains. Talk with your dietitian about how many servings of carbs you can eat at each meal.  Eat 4-6 oz (112-168 g) of lean protein each day, such as lean meat, chicken, fish, eggs, or tofu. One ounce (oz) of lean protein is equal to: ? 1 oz (28 g) of meat, chicken, or fish. ? 1 egg. ?  cup (62 g) of tofu.  Eat some foods each day that contain healthy fats, such as avocado, nuts, seeds, and fish.   What foods should I eat? Fruits Berries. Apples. Oranges. Peaches. Apricots. Plums. Grapes. Mango. Papaya. Pomegranate. Kiwi. Cherries. Vegetables Lettuce. Spinach. Leafy greens, including kale, chard, collard greens, and mustard greens. Beets. Cauliflower. Cabbage. Broccoli. Carrots. Green beans. Tomatoes. Peppers. Onions. Cucumbers. Brussels sprouts. Grains Whole grains, such as whole-wheat or whole-grain bread, crackers, tortillas, cereal, and pasta. Unsweetened oatmeal. Quinoa. Brown or wild rice. Meats and other proteins Seafood. Poultry without skin. Lean cuts of poultry and beef. Tofu. Nuts. Seeds. Dairy Low-fat or fat-free dairy products such as milk, yogurt, and cheese. The items listed above may not be a complete list of foods and beverages you can eat. Contact a dietitian for more information. What foods should I avoid? Fruits Fruits canned with  syrup. Vegetables Canned vegetables. Frozen vegetables with butter or cream sauce. Grains Refined white flour and flour products such as bread, pasta, snack foods, and cereals. Avoid all processed foods. Meats and other proteins Fatty cuts of meat. Poultry with skin. Breaded or fried meats. Processed meat. Avoid saturated fats. Dairy Full-fat yogurt, cheese, or milk. Beverages Sweetened drinks, such as soda or iced tea. The items listed above may not be a complete list of foods and beverages you should avoid. Contact a dietitian for more information. Questions to ask a health care provider  Do I need to meet with a diabetes educator?  Do I need to meet with a dietitian?  What number can I call if I have questions?  When are the best times to check my blood glucose? Where to find more information:  American Diabetes Association: diabetes.org  Academy of Nutrition and Dietetics: www.eatright.org  National Institute of Diabetes and Digestive and Kidney Diseases: www.niddk.nih.gov  Association of Diabetes Care and Education Specialists: www.diabeteseducator.org Summary  It is important to have healthy eating   habits because your blood sugar (glucose) levels are greatly affected by what you eat and drink.  A healthy meal plan will help you control your blood glucose and maintain a healthy lifestyle.  Your health care provider may recommend that you work with a dietitian to make a meal plan that is best for you.  Keep in mind that carbohydrates (carbs) and alcohol have immediate effects on your blood glucose levels. It is important to count carbs and to use alcohol carefully. This information is not intended to replace advice given to you by your health care provider. Make sure you discuss any questions you have with your health care provider. Document Revised: 07/11/2019 Document Reviewed: 07/11/2019 Elsevier Patient Education  2021 Elsevier Inc.  

## 2021-01-31 ENCOUNTER — Other Ambulatory Visit: Payer: Self-pay | Admitting: Obstetrics and Gynecology

## 2021-02-05 ENCOUNTER — Other Ambulatory Visit: Payer: Self-pay | Admitting: Nurse Practitioner

## 2021-02-05 ENCOUNTER — Telehealth: Payer: Self-pay

## 2021-02-05 MED ORDER — FREESTYLE SYSTEM KIT: 1.0000 | PACK | Freq: Every day | 0 refills | Status: DC

## 2021-02-05 NOTE — Telephone Encounter (Signed)
Test stripes need to be refill  Her number [place] for pharmacy

## 2021-02-05 NOTE — Telephone Encounter (Signed)
sent 

## 2021-02-06 ENCOUNTER — Ambulatory Visit
Admission: RE | Admit: 2021-02-06 | Discharge: 2021-02-06 | Disposition: A | Discharge status: Home / Self Care | Payer: Medicaid Other | Source: Ambulatory Visit | Attending: Obstetrics and Gynecology | Admitting: Obstetrics and Gynecology

## 2021-02-06 ENCOUNTER — Other Ambulatory Visit: Payer: Self-pay

## 2021-02-06 DIAGNOSIS — N946 Dysmenorrhea, unspecified: Secondary | ICD-10-CM | POA: Insufficient documentation

## 2021-02-06 DIAGNOSIS — N920 Excessive and frequent menstruation with regular cycle: Secondary | ICD-10-CM | POA: Diagnosis not present

## 2021-02-06 DIAGNOSIS — Z9851 Tubal ligation status: Secondary | ICD-10-CM | POA: Diagnosis not present

## 2021-02-06 DIAGNOSIS — N852 Hypertrophy of uterus: Secondary | ICD-10-CM | POA: Diagnosis not present

## 2021-02-24 ENCOUNTER — Encounter: Payer: Self-pay | Admitting: Obstetrics and Gynecology

## 2021-02-24 ENCOUNTER — Ambulatory Visit (INDEPENDENT_AMBULATORY_CARE_PROVIDER_SITE_OTHER): Payer: Medicaid Other | Admitting: Obstetrics and Gynecology

## 2021-02-24 ENCOUNTER — Other Ambulatory Visit: Payer: Self-pay

## 2021-02-24 VITALS — BP 134/88 | HR 80 | Ht 67.0 in | Wt 258.6 lb

## 2021-02-24 DIAGNOSIS — E11641 Type 2 diabetes mellitus with hypoglycemia with coma: Secondary | ICD-10-CM | POA: Diagnosis not present

## 2021-02-24 DIAGNOSIS — N946 Dysmenorrhea, unspecified: Secondary | ICD-10-CM | POA: Diagnosis not present

## 2021-02-24 DIAGNOSIS — N8003 Adenomyosis of the uterus: Secondary | ICD-10-CM | POA: Insufficient documentation

## 2021-02-24 DIAGNOSIS — N800 Endometriosis of uterus: Secondary | ICD-10-CM | POA: Diagnosis not present

## 2021-02-24 NOTE — Progress Notes (Signed)
Obstetrics and Gynecology Visit Return Patient Evaluation  Appointment Date: 02/24/2021  Primary Care Provider: Dionisio David, NP  OBGYN Clinic: [facility]  Chief Complaint: ultrasound follow up  History of Present Illness:  Beverly Johnson is a 45 y.o. Beverly Johnson (LMP: June 2022), seen for the above chief complaint. Her past medical history is significant for HIV, HTN, poorly controlled DM2, BMI 40s, h/o c/s x 3 with BTL, anemia, chronic yeast infections  Patient seen for new patient visit with me on 6/2 for heavy and painful periods. Her pap smear and STD swab testing was negative and she was set up for an ultrasound and follow up visit. She was also given lysteda to taken in the interim for any periods.  Interval History: she had a regular period for her (heavy and somewhat painful) in June; she did not take the [place]. U/s showed likely adenomyosis (see below)  Review of Systems: as noted in the History of Present Illness.  Patient Active Problem List   Diagnosis Date Noted   Adenomyosis 02/24/2021   Vulvovaginal candidiasis 01/16/2021   Dysmenorrhea 01/16/2021   Callus of foot 11/26/2020   Heavy menses 11/26/2020   History of 2019 novel coronavirus disease (COVID-19) 06/16/2019   Abnormal ECG 06/16/2019   Medication monitoring encounter 11/12/2017   Breast nodule 09/02/2017   Screening examination for venereal disease 03/30/2017   Encounter for long-term (current) use of high-risk medication 03/30/2017   Microalbuminuria due to type 2 diabetes mellitus ([place]) 03/30/2017   Knee pain, chronic 03/30/2017   Vitamin D deficiency 03/21/2017   Healthcare maintenance 03/02/2014   Morbid obesity with BMI of 45.0-49.9, adult ([place]) 04/27/2012   Uncontrolled type 2 diabetes mellitus ([place]) 12/08/2010   Human immunodeficiency virus (HIV) disease ([place]) 09/29/2006   Medications:  [name] had no medications administered during this visit. Current  Outpatient Medications  Medication Sig Dispense Refill   GENVOYA 150-150-200-10 MG TABS tablet TAKE 1 TABLET BY MOUTH DAILY WITH BREAKFAST. (Patient taking differently: Take 1 tablet by mouth daily with breakfast.) 30 tablet 3   glipiZIDE (GLUCOTROL) 10 MG tablet Take 1 tablet (10 mg total) by mouth 2 (two) times daily before a meal. 60 tablet 3   glucose blood test strip Check your sugar in the morning before you eat breakfast, and one hour after a meal. 100 each 2   glucose monitoring kit (FREESTYLE) monitoring kit 1 each by Does not apply route daily. Check glucose once in the morning before breakfast and 1 hour after a meal 1 each 0   metFORMIN (GLUCOPHAGE) 500 MG tablet Take 2 tablets (1,000 mg total) by mouth 2 (two) times daily with a meal. 120 tablet 11   sitaGLIPtin (JANUVIA) 100 MG tablet Take 1 tablet (100 mg total) by mouth daily. (Patient not taking: Reported on 01/23/2021) 30 tablet 11   tranexamic acid (LYSTEDA) 650 MG TABS tablet Take 2 tablets (1,300 mg total) by mouth 3 (three) times daily. Take during menses for a maximum of five days (Patient not taking: Reported on 01/23/2021) 30 tablet 2   No current facility-administered medications for this visit.    Allergies: has No Known Allergies.  Physical Exam:  BP 134/88   Pulse 80   Ht _0  (1.702 m)   Wt 258 lb 9.6 oz (117.3 kg)   LMP 01/27/2021 (Approximate)   BMI 40.50 kg/m  Body mass index is 40.5 kg/m. General appearance: Well nourished, well developed female in no acute  distress.  Abdomen: diffusely non tender to palpation, non distended, and no masses, hernias Neuro/Psych:  Normal mood and affect.    Labs: as per HPI  Radiology: Narrative & Impression  CLINICAL DATA:  Heavy painful cycles for 2 years. Prior C-section x3. Previous bilateral tubal ligation. LMP 02/01/2021.   EXAM: TRANSABDOMINAL AND TRANSVAGINAL ULTRASOUND OF PELVIS   TECHNIQUE: Both transabdominal and transvaginal ultrasound examinations of  the pelvis were performed. Transabdominal technique was performed for global imaging of the pelvis including uterus, ovaries, adnexal regions, and pelvic cul-de-sac. It was necessary to proceed with endovaginal exam following the transabdominal exam to visualize the endometrium.   COMPARISON:  None   FINDINGS: Uterus   Measurements: 14.1 x 7.4 x 7.5 centimeters = volume: 410.4 mL. The fundus is somewhat globular and hypoechoic, possibly related to myometrial fibroids. Discrete fibroids are not identified. Uterus varies in position based on scanning approach.   Endometrium   Thickness: The endometrium is difficult to visualize secondary to artifact. No focal abnormality visualized.   Right ovary   Measurements: RIGHT ovary is not visualized.   Left ovary   Measurements: 3.5 x 1.8 x 2.3 centimeters = volume: 7.4 mL. Normal appearance/no adnexal mass.   Other findings   No abnormal free fluid.   IMPRESSION: Uterus is enlarged. The fundus is somewhat globular, raising the question of fibroids. The endometrial stripe is not well seen. Given the limitations of ultrasound in this patient, consider further evaluation with MRI of the pelvis.   LEFT ovary is normal in appearance; RIGHT ovary is not visualized.     Electronically Signed   By: Nolon Nations M.D.   On: 02/06/2021 14:15    Assessment: patient stable   Plan: 1. Uncontrolled type 2 diabetes mellitus with hypoglycemia and coma ([place]) See below - Hemoglobin A1c  2. Adenomyosis I told her that given her u/s findings and her h/o c-sections that she most likely has adenomyosis. Medical and surgical options d/w her and I told her that I would recommend medical options first but especially because of her poorly controlled diabetes and that I would want her a1c ideally 7 or below but 7.5 would okay. I told her that I would prefer an LNG IUD such as Mirena or Liletta as the first line option with depo provera and  OCPs being 2nd and third. I also d/w her that her adenomyosis would get progressively unless she's on a method that can stop or at least reduce her cycle.  Patient would like to consider options and see what her a1c is today. It was 73 in mid April; she has a pcp appt in early august  3. Dysmenorrhea See above   RTC: follow up with patient after a1c  Durene Romans MD Attending Center for [facility])

## 2021-02-25 LAB — HEMOGLOBIN A1C
Est. average glucose Bld gHb Est-mCnc: 235 mg/dL
Hgb A1c MFr Bld: 9.8 % — ABNORMAL HIGH (ref 4.8–5.6)

## 2021-03-07 ENCOUNTER — Other Ambulatory Visit: Payer: Self-pay

## 2021-03-07 ENCOUNTER — Ambulatory Visit: Payer: Medicaid Other | Admitting: Podiatry

## 2021-03-07 ENCOUNTER — Encounter: Payer: Self-pay | Admitting: Podiatry

## 2021-03-07 DIAGNOSIS — Z7189 Other specified counseling: Secondary | ICD-10-CM

## 2021-03-07 DIAGNOSIS — Q828 Other specified congenital malformations of skin: Secondary | ICD-10-CM | POA: Diagnosis not present

## 2021-03-07 NOTE — Progress Notes (Signed)
  Subjective:  Patient ID: Beverly Johnson, female    DOB: Oct 06, 1975,  MRN: [id number]  Chief Complaint  Patient presents with   Callouses    Bilateral callus    45 y.o. female returns for the above complaint.  Patient presents with follow-up of right porokeratosis submetatarsal 3.  Patient states it continues to be painful.  She is states she works constantly at [employer].  It hurts to ambulate.  She would like to discuss treatment options.  Her A1c has been brought down to 9.8% now  Objective:   There were no vitals filed for this visit. Podiatric Exam: Vascular: dorsalis pedis and posterior tibial pulses are palpable bilateral. Capillary return is immediate. Temperature gradient is WNL. Skin turgor WNL  Sensorium: Normal Semmes Weinstein monofilament test. Normal tactile sensation bilaterally. Nail Exam: Pt has thick disfigured discolored nails with subungual debris noted bilateral entire nail hallux through fifth toenails Ulcer Exam: There is no evidence of ulcer or pre-ulcerative changes or infection. Orthopedic Exam: Muscle tone and strength are WNL. No limitations in general ROM. No crepitus or effusions noted. HAV  B/L.  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  Pain on palpation to the lesion.  No infection or ulcers  Assessment & Plan:  Patient was evaluated and treated and all questions answered.  Porokeratosis (hyperkeratotic lesion sub-met 3) -The lesion was adequately debrided down to healthy tissue no pinpoint bleeding noted ruling out wart. -Metatarsal pad was asked to offload the forefoot -I also discussed with her the possibility of surgery however she is a diabetic with last A1c that was taken in 9.8%.  Once her A1c is below 8% we can discuss doing a floating osteotomy to give her relief while ambulating.  Patient states understanding  Encounter for diabetic education -I explained to patient the etiology of diabetes  and various treatment options were extensively discussed.  I encouraged her to come do diet control as well as follow-up with her endocrinologist as it is very imperative to bring her A1c down.  Last A1c was 13%.  Her most recent A1c is now at 9.8%.  It is trending downwards we will continue to monitor.   Boneta Lucks, DPM    No follow-ups on file.

## 2021-03-17 ENCOUNTER — Other Ambulatory Visit: Payer: Self-pay | Admitting: Obstetrics and Gynecology

## 2021-03-17 ENCOUNTER — Ambulatory Visit
Admission: RE | Admit: 2021-03-17 | Discharge: 2021-03-17 | Disposition: A | Discharge status: Home / Self Care | Payer: Medicaid Other | Source: Ambulatory Visit | Attending: Obstetrics and Gynecology | Admitting: Obstetrics and Gynecology

## 2021-03-17 ENCOUNTER — Other Ambulatory Visit: Payer: Self-pay

## 2021-03-17 ENCOUNTER — Ambulatory Visit: Payer: Medicaid Other

## 2021-03-17 DIAGNOSIS — N63 Unspecified lump in unspecified breast: Secondary | ICD-10-CM

## 2021-03-17 DIAGNOSIS — Z1231 Encounter for screening mammogram for malignant neoplasm of breast: Secondary | ICD-10-CM

## 2021-03-19 ENCOUNTER — Ambulatory Visit (INDEPENDENT_AMBULATORY_CARE_PROVIDER_SITE_OTHER): Payer: Medicaid Other | Admitting: Nurse Practitioner

## 2021-03-19 ENCOUNTER — Other Ambulatory Visit: Payer: Self-pay

## 2021-03-19 ENCOUNTER — Encounter: Payer: Self-pay | Admitting: Nurse Practitioner

## 2021-03-19 VITALS — BP 120/75 | HR 82 | Temp 97.9°F | Ht 67.0 in | Wt 258.1 lb

## 2021-03-19 DIAGNOSIS — B20 Human immunodeficiency virus [HIV] disease: Secondary | ICD-10-CM | POA: Diagnosis not present

## 2021-03-19 DIAGNOSIS — E1165 Type 2 diabetes mellitus with hyperglycemia: Secondary | ICD-10-CM

## 2021-03-19 DIAGNOSIS — Z6841 Body Mass Index (BMI) 40.0 and over, adult: Secondary | ICD-10-CM | POA: Diagnosis not present

## 2021-03-19 MED ORDER — SITAGLIPTIN PHOSPHATE 100 MG PO TABS: 100.0000 mg | ORAL_TABLET | Freq: Every day | ORAL | 11 refills | Status: DC

## 2021-03-19 MED ORDER — METFORMIN HCL 500 MG PO TABS: 1000.0000 mg | ORAL_TABLET | Freq: Two times a day (BID) | ORAL | 11 refills | Status: DC

## 2021-03-19 MED ORDER — GLIPIZIDE 10 MG PO TABS: 10.0000 mg | ORAL_TABLET | Freq: Two times a day (BID) | ORAL | 3 refills | Status: DC

## 2021-03-19 MED ORDER — ACCU-CHEK GUIDE VI STRP: ORAL_STRIP | 12 refills | Status: DC

## 2021-03-19 NOTE — Progress Notes (Signed)
[place], [place]  [address] Phone:  [phone]   Fax:  [phone]   Established Patient Office Visit  Subjective:  Patient ID: Beverly Johnson, female    DOB: Feb 04, 1976  Age: 45 y.o. MRN: [id number]  CC:  Chief Complaint  Patient presents with   Follow-up    6 week follow up;     HPI LASHYA PASSE presents for follow up. She  has a past medical history of DM2 (diabetes mellitus, type 2) ([place]), Gallstones (03/10/2011), HIV infection ([place]), and Immune deficiency disorder ([place]).   Diabetes Mellitus Patient presents for follow up of diabetes. Current symptoms include: hyperglycemia and right foot callus . Symptoms have progressed to a point and plateaued. Patient denies foot ulcerations, hypoglycemia , nausea, polydipsia, polyuria, and vomiting. Evaluation to date has included: fasting blood sugar and hemoglobin A1C.  Home sugars: 100 - 200. . Current treatment: more intensive attention to diet which has been not very effective, Continued sulfonylurea which has been unable to assess effectiveness, Continued metformin which has been not very effective, and Continued Januvia which has been unable to assess effectiveness.  She reports that she has difficulty remembering to take her medication she reports her husband is trying to remind her.  She does continue to work full-time and Tylenol makes it difficult.  She has plans on admission care with an eye care professional.    Past Medical History:  Diagnosis Date   DM2 (diabetes mellitus, type 2) ([place])    Gallstones 03/10/2011   HIV infection ([place])    Immune deficiency disorder ([place])     Past Surgical History:  Procedure Laterality Date   CESAREAN SECTION     x3   CHOLECYSTECTOMY  06/29/2011   Procedure: LAPAROSCOPIC CHOLECYSTECTOMY WITH INTRAOPERATIVE CHOLANGIOGRAM;  Surgeon: Merrie Roof, MD;  Location: [place];  Service: General;  Laterality: N/A;   TUBAL LIGATION      Family  History  Problem Relation Age of Onset   Healthy Mother    Healthy Father     Social History   Socioeconomic History   Marital status: Married    Spouse name: Not on file   Number of children: Not on file   Years of education: 10   Highest education level: Not on file  Occupational History    Employer: UNEMPLOYED  Tobacco Use   Smoking status: Never   Smokeless tobacco: Never  Vaping Use   Vaping Use: Never used  Substance and Sexual Activity   Alcohol use: No   Drug use: Yes    Types: Marijuana    Comment: CBD   Sexual activity: Yes    Partners: Male    Comment: pt. declined condoms  Other Topics Concern   Not on file  Social History Narrative   Not on file   Social Determinants of Health   Financial Resource Strain: Not on file  Food Insecurity: No Food Insecurity   Worried About Running Out of Food in the Last Year: Never true   [place] in the Last Year: Never true  Transportation Needs: No Transportation Needs   Lack of Transportation (Medical): No   Lack of Transportation (Non-Medical): No  Physical Activity: Not on file  Stress: Not on file  Social Connections: Not on file  Intimate Partner Violence: Not on file    Outpatient Medications Prior to Visit  Medication Sig Dispense Refill   GENVOYA 150-150-200-10 MG  TABS tablet TAKE 1 TABLET BY MOUTH DAILY WITH BREAKFAST. (Patient taking differently: Take 1 tablet by mouth daily with breakfast.) 30 tablet 3   glucose blood test strip Check your sugar in the morning before you eat breakfast, and one hour after a meal. 100 each 2   glucose monitoring kit (FREESTYLE) monitoring kit 1 each by Does not apply route daily. Check glucose once in the morning before breakfast and 1 hour after a meal 1 each 0   glipiZIDE (GLUCOTROL) 10 MG tablet Take 1 tablet (10 mg total) by mouth 2 (two) times daily before a meal. 60 tablet 3   metFORMIN (GLUCOPHAGE) 500 MG tablet Take 2 tablets (1,000 mg total) by mouth 2 (two)  times daily with a meal. 120 tablet 11   sitaGLIPtin (JANUVIA) 100 MG tablet Take 1 tablet (100 mg total) by mouth daily. 30 tablet 11   Accu-Chek Softclix Lancets lancets 1 each 3 (three) times daily.     tranexamic acid (LYSTEDA) 650 MG TABS tablet Take 2 tablets (1,300 mg total) by mouth 3 (three) times daily. Take during menses for a maximum of five days (Patient not taking: Reported on 01/23/2021) 30 tablet 2   No facility-administered medications prior to visit.    No Known Allergies  ROS Review of Systems    Objective:    Physical Exam Constitutional:      General: She is not in acute distress.    Appearance: She is obese. She is not ill-appearing, toxic-appearing or diaphoretic.  HENT:     Head: Atraumatic.  Cardiovascular:     Rate and Rhythm: Normal rate and regular rhythm.     Pulses: Normal pulses.  Pulmonary:     Effort: Pulmonary effort is normal.     Comments: Diminished breath sounds throughout Abdominal:     Palpations: Abdomen is soft.  Musculoskeletal:     Cervical back: Normal range of motion.  Feet:     Right foot:     Skin integrity: Callus present.  Skin:    General: Skin is warm and dry.     Capillary Refill: Capillary refill takes less than 2 seconds.  Neurological:     General: No focal deficit present.     Mental Status: She is alert and oriented to person, place, and time.  Psychiatric:        Mood and Affect: Mood normal.        Behavior: Behavior normal.        Thought Content: Thought content normal.        Judgment: Judgment normal.    BP 120/75 (BP Location: Left Arm, Patient Position: Sitting)   Pulse 82   Temp 97.9 F (36.6 C)   Ht _0  (1.702 m)   Wt 258 lb 0.8 oz (117.1 kg)   SpO2 100%   BMI 40.42 kg/m  Wt Readings from Last 3 Encounters:  03/19/21 258 lb 0.8 oz (117.1 kg)  02/24/21 258 lb 9.6 oz (117.3 kg)  01/16/21 259 lb 6.4 oz (117.7 kg)     Health Maintenance Due  Topic Date Due   FOOT EXAM  12/03/2016    COVID-19 Vaccine (3 - Pfizer risk series) 05/28/2020   INFLUENZA VACCINE  03/17/2021    There are no preventive care reminders to display for this patient.  Lab Results  Component Value Date   TSH 1.105 06/17/2019   Lab Results  Component Value Date   WBC 4.1 01/22/2021   HGB 9.3 (L)  01/22/2021   HCT 32.5 (L) 01/22/2021   MCV 72.9 (L) 01/22/2021   PLT 196 01/22/2021   Lab Results  Component Value Date   NA 135 01/22/2021   K 3.7 01/22/2021   CO2 23 01/22/2021   GLUCOSE 219 (H) 01/22/2021   BUN 8 01/22/2021   CREATININE 0.71 01/22/2021   BILITOT 0.3 11/05/2020   ALKPHOS 173 (H) 10/21/2020   AST 17 11/05/2020   ALT 11 11/05/2020   PROT 7.3 11/05/2020   ALBUMIN 3.3 (L) 10/21/2020   CALCIUM 8.8 (L) 01/22/2021   ANIONGAP 8 01/22/2021   Lab Results  Component Value Date   CHOL 148 11/05/2020   Lab Results  Component Value Date   HDL 56 11/05/2020   Lab Results  Component Value Date   LDLCALC 73 11/05/2020   Lab Results  Component Value Date   TRIG 108 11/05/2020   Lab Results  Component Value Date   CHOLHDL 2.6 11/05/2020   Lab Results  Component Value Date   HGBA1C 9.8 (H) 02/24/2021      Assessment & Plan:   Problem List Items Addressed This Visit       Endocrine   Uncontrolled type 2 diabetes mellitus ([place]) - Primary (Chronic) Persistent Encourage compliance with current treatment regimen.  Discussed ways to remember to take medication with smart phone reminders Encourage regular CBG monitoring test strips refill Encourage contacting office if excessive hyperglycemia and or hypoglycemia Lifestyle modification with healthy diet (fewer calories, more high fiber foods, whole grains and non-starchy vegetables, lower fat meat and fish, low-fat diary include healthy oils) regular exercise (physical activity) and weight loss Opthalmology exam discussed    Relevant Medications   Accu-Chek Softclix Lancets lancets   glipiZIDE (GLUCOTROL) 10 MG tablet    metFORMIN (GLUCOPHAGE) 500 MG tablet   sitaGLIPtin (JANUVIA) 100 MG tablet   glucose blood (ACCU-CHEK GUIDE) test strip     Other   Human immunodeficiency virus (HIV) disease (H[facility]) (Chronic) Stable follow-up with infectious disease as scheduled   Other Visit Diagnoses     Morbid obesity with BMI of 40.0-44.9, adult ([place])   Persistent Obesity with BMI and comorbidities as noted above.  Discussed proper diet (low fat, low sodium, high fiber) with patient.   Discussed need for regular exercise (3 times per week, 20 minutes per session) with patient.    Relevant Medications   glipiZIDE (GLUCOTROL) 10 MG tablet   metFORMIN (GLUCOPHAGE) 500 MG tablet   sitaGLIPtin (JANUVIA) 100 MG tablet       Meds ordered this encounter  Medications   glipiZIDE (GLUCOTROL) 10 MG tablet    Sig: Take 1 tablet (10 mg total) by mouth 2 (two) times daily before a meal.    Dispense:  60 tablet    Refill:  3    Order Specific Question:   Supervising Provider    Answer:   Tresa Garter [[id number]]   metFORMIN (GLUCOPHAGE) 500 MG tablet    Sig: Take 2 tablets (1,000 mg total) by mouth 2 (two) times daily with a meal.    Dispense:  120 tablet    Refill:  11    Order Specific Question:   Supervising Provider    Answer:   Tresa Garter [[id number]]   sitaGLIPtin (JANUVIA) 100 MG tablet    Sig: Take 1 tablet (100 mg total) by mouth daily.    Dispense:  30 tablet    Refill:  11    Order Specific Question:  Supervising Provider    Answer:   Tresa Garter [[id number]]   glucose blood (ACCU-CHEK GUIDE) test strip    Sig: Use as instructed    Dispense:  100 each    Refill:  12    Order Specific Question:   Supervising Provider    Answer:   Tresa Garter [[id number]]     Follow-up: Return in about 6 weeks (around 04/30/2021).    Vevelyn Francois, NP

## 2021-03-19 NOTE — Patient Instructions (Signed)
Diabetes Mellitus and Nutrition, Adult When you have diabetes, or diabetes mellitus, it is very important to have healthy eating habits because your blood sugar (glucose) levels are greatly affected by what you eat and drink. Eating healthy foods in the right amounts, at about the same times every day, can help you:  Control your blood glucose.  Lower your risk of heart disease.  Improve your blood pressure.  Reach or maintain a healthy weight. What can affect my meal plan? Every person with diabetes is different, and each person has different needs for a meal plan. Your health care provider may recommend that you work with a dietitian to make a meal plan that is best for you. Your meal plan may vary depending on factors such as:  The calories you need.  The medicines you take.  Your weight.  Your blood glucose, blood pressure, and cholesterol levels.  Your activity level.  Other health conditions you have, such as heart or kidney disease. How do carbohydrates affect me? Carbohydrates, also called carbs, affect your blood glucose level more than any other type of food. Eating carbs naturally raises the amount of glucose in your blood. Carb counting is a method for keeping track of how many carbs you eat. Counting carbs is important to keep your blood glucose at a healthy level, especially if you use insulin or take certain oral diabetes medicines. It is important to know how many carbs you can safely have in each meal. This is different for every person. Your dietitian can help you calculate how many carbs you should have at each meal and for each snack. How does alcohol affect me? Alcohol can cause a sudden decrease in blood glucose (hypoglycemia), especially if you use insulin or take certain oral diabetes medicines. Hypoglycemia can be a life-threatening condition. Symptoms of hypoglycemia, such as sleepiness, dizziness, and confusion, are similar to symptoms of having too much  alcohol.  Do not drink alcohol if: ? Your health care provider tells you not to drink. ? You are pregnant, may be pregnant, or are planning to become pregnant.  If you drink alcohol: ? Do not drink on an empty stomach. ? Limit how much you use to:  0-1 drink a day for women.  0-2 drinks a day for men. ? Be aware of how much alcohol is in your drink. In the [place] one drink equals one 12 oz bottle of beer (355 mL), one 5 oz glass of wine (148 mL), or one 1 oz glass of hard liquor (44 mL). ? Keep yourself hydrated with water, diet soda, or unsweetened iced tea.  Keep in mind that regular soda, juice, and other mixers may contain a lot of sugar and must be counted as carbs. What are tips for following this plan? Reading food labels  Start by checking the serving size on the "Nutrition Facts" label of packaged foods and drinks. The amount of calories, carbs, fats, and other nutrients listed on the label is based on one serving of the item. Many items contain more than one serving per package.  Check the total grams (g) of carbs in one serving. You can calculate the number of servings of carbs in one serving by dividing the total carbs by 15. For example, if a food has 30 g of total carbs per serving, it would be equal to 2 servings of carbs.  Check the number of grams (g) of saturated fats and trans fats in one serving. Choose foods that have   a low amount or none of these fats.  Check the number of milligrams (mg) of salt (sodium) in one serving. Most people should limit total sodium intake to less than 2,300 mg per day.  Always check the nutrition information of foods labeled as "low-fat" or "nonfat." These foods may be higher in added sugar or refined carbs and should be avoided.  Talk to your dietitian to identify your daily goals for nutrients listed on the label. Shopping  Avoid buying canned, pre-made, or processed foods. These foods tend to be high in fat, sodium, and added  sugar.  Shop around the outside edge of the grocery store. This is where you will most often find fresh fruits and vegetables, bulk grains, fresh meats, and fresh dairy. Cooking  Use low-heat cooking methods, such as baking, instead of high-heat cooking methods like deep frying.  Cook using healthy oils, such as olive, canola, or sunflower oil.  Avoid cooking with butter, cream, or high-fat meats. Meal planning  Eat meals and snacks regularly, preferably at the same times every day. Avoid going long periods of time without eating.  Eat foods that are high in fiber, such as fresh fruits, vegetables, beans, and whole grains. Talk with your dietitian about how many servings of carbs you can eat at each meal.  Eat 4-6 oz (112-168 g) of lean protein each day, such as lean meat, chicken, fish, eggs, or tofu. One ounce (oz) of lean protein is equal to: ? 1 oz (28 g) of meat, chicken, or fish. ? 1 egg. ?  cup (62 g) of tofu.  Eat some foods each day that contain healthy fats, such as avocado, nuts, seeds, and fish.   What foods should I eat? Fruits Berries. Apples. Oranges. Peaches. Apricots. Plums. Grapes. Mango. Papaya. Pomegranate. Kiwi. Cherries. Vegetables Lettuce. Spinach. Leafy greens, including kale, chard, collard greens, and mustard greens. Beets. Cauliflower. Cabbage. Broccoli. Carrots. Green beans. Tomatoes. Peppers. Onions. Cucumbers. Brussels sprouts. Grains Whole grains, such as whole-wheat or whole-grain bread, crackers, tortillas, cereal, and pasta. Unsweetened oatmeal. Quinoa. Brown or wild rice. Meats and other proteins Seafood. Poultry without skin. Lean cuts of poultry and beef. Tofu. Nuts. Seeds. Dairy Low-fat or fat-free dairy products such as milk, yogurt, and cheese. The items listed above may not be a complete list of foods and beverages you can eat. Contact a dietitian for more information. What foods should I avoid? Fruits Fruits canned with  syrup. Vegetables Canned vegetables. Frozen vegetables with butter or cream sauce. Grains Refined white flour and flour products such as bread, pasta, snack foods, and cereals. Avoid all processed foods. Meats and other proteins Fatty cuts of meat. Poultry with skin. Breaded or fried meats. Processed meat. Avoid saturated fats. Dairy Full-fat yogurt, cheese, or milk. Beverages Sweetened drinks, such as soda or iced tea. The items listed above may not be a complete list of foods and beverages you should avoid. Contact a dietitian for more information. Questions to ask a health care provider  Do I need to meet with a diabetes educator?  Do I need to meet with a dietitian?  What number can I call if I have questions?  When are the best times to check my blood glucose? Where to find more information:  American Diabetes Association: diabetes.org  Academy of Nutrition and Dietetics: www.eatright.org  National Institute of Diabetes and Digestive and Kidney Diseases: www.niddk.nih.gov  Association of Diabetes Care and Education Specialists: www.diabeteseducator.org Summary  It is important to have healthy eating   habits because your blood sugar (glucose) levels are greatly affected by what you eat and drink.  A healthy meal plan will help you control your blood glucose and maintain a healthy lifestyle.  Your health care provider may recommend that you work with a dietitian to make a meal plan that is best for you.  Keep in mind that carbohydrates (carbs) and alcohol have immediate effects on your blood glucose levels. It is important to count carbs and to use alcohol carefully. This information is not intended to replace advice given to you by your health care provider. Make sure you discuss any questions you have with your health care provider. Document Revised: 07/11/2019 Document Reviewed: 07/11/2019 Elsevier Patient Education  2021 Elsevier Inc.  

## 2021-04-24 ENCOUNTER — Other Ambulatory Visit: Payer: Self-pay

## 2021-04-24 ENCOUNTER — Emergency Department (HOSPITAL_COMMUNITY)
Admission: EM | Admit: 2021-04-24 | Discharge: 2021-04-24 | Disposition: A | Discharge status: Home / Self Care | Payer: Medicaid Other | Attending: Emergency Medicine | Admitting: Emergency Medicine

## 2021-04-24 ENCOUNTER — Emergency Department (HOSPITAL_COMMUNITY): Payer: Medicaid Other

## 2021-04-24 DIAGNOSIS — G8929 Other chronic pain: Secondary | ICD-10-CM | POA: Diagnosis not present

## 2021-04-24 DIAGNOSIS — R21 Rash and other nonspecific skin eruption: Secondary | ICD-10-CM | POA: Diagnosis not present

## 2021-04-24 DIAGNOSIS — Z7984 Long term (current) use of oral hypoglycemic drugs: Secondary | ICD-10-CM | POA: Insufficient documentation

## 2021-04-24 DIAGNOSIS — Z8616 Personal history of COVID-19: Secondary | ICD-10-CM | POA: Insufficient documentation

## 2021-04-24 DIAGNOSIS — M25561 Pain in right knee: Secondary | ICD-10-CM | POA: Diagnosis not present

## 2021-04-24 DIAGNOSIS — E119 Type 2 diabetes mellitus without complications: Secondary | ICD-10-CM | POA: Insufficient documentation

## 2021-04-24 LAB — POC URINE PREG, ED: Preg Test, Ur: NEGATIVE

## 2021-04-24 NOTE — Discharge Instructions (Addendum)
It was a pleasure taking care of you today.  As discussed, your x-ray did not show any broken bones or acute abnormalities.  I have included the number of the orthopedic surgeon.  Please call to schedule an appointment for further evaluation if symptoms do not improve over the next week.  You may take over-the-counter ibuprofen or Tylenol as needed for pain.  Continue to ice and elevate your right leg.  You may use the knee brace and crutches as needed for comfort.  Return to the ER for any worsening symptoms.

## 2021-04-24 NOTE — ED Triage Notes (Signed)
C/o right lower leg pain that's been going on for awhile; denies any recent injury.

## 2021-04-24 NOTE — ED Provider Notes (Signed)
Emergency Medicine Provider Triage Evaluation Note  Beverly Johnson , a 45 y.o. female  was evaluated in triage.  Pt complains of right knee pain and states that she has had her right knee for years states that she has historically used a crutch in the past because of pain of her knee.  States that over the past 2 days it has been more uncomfortable with prompted her to come to the emergency room.  She denies any significant swelling of her leg.  Chest pain or shortness of breath lightheadedness or dizziness.  Review of Systems  Positive: Right knee pain Negative: Fever  Physical Exam  BP 133/83 (BP Location: Right Arm)   Pulse 77   Temp 97.9 F (36.6 C) (Oral)   Resp 16   SpO2 100%  Gen:   Awake, no distress   Resp:  Normal effort  MSK:   Moves extremities without difficulty  Other:  Dorsalis pedis pulse 3+ and easily palpable and right lower extremity.  Cap refill intact.  Sensation intact.  Medical Decision Making  Medically screening exam initiated at 7:56 AM.  Appropriate orders placed.  [name] was informed that the remainder of the evaluation will be completed by another provider, this initial triage assessment does not replace that evaluation, and the importance of remaining in the ED until their evaluation is complete.  Patient well-appearing seems to have chronic knee pain.   Pati Gallo [place], [place] 04/24/21 [id number]    Blanchie Dessert, MD 04/24/21 [id number]

## 2021-04-24 NOTE — ED Provider Notes (Signed)
[place] EMERGENCY DEPARTMENT Provider Note   CSN: [id number] Arrival date & time: 04/24/21  0715     History Chief Complaint  Patient presents with   Leg Pain    Beverly Johnson is a 45 y.o. female with a past medical history significant for diabetes and HIV who presents to the ED due to acute on chronic right knee pain.  Patient states pain has been present for the past few years however, worsened over the past few days.  Patient states pain radiates up and down her leg.  Denies low back pain.  Patient states she works at [employer] and is on her feet frequently.  She has not tried anything for her pain.  Denies recent trauma.  Rates her pain a 9/10 and states she feels like it is "internal deep knee pain".  Denies lower extremity edema.  No chest pain or shortness of breath.  No history of blood clots, recent surgeries or recent long immobilizations, hormonal treatments. Denies fever and chills.    History obtained from patient and past medical records. No interpreter used during encounter.        Past Medical History:  Diagnosis Date   DM2 (diabetes mellitus, type 2) ([place])    Gallstones 03/10/2011   HIV infection ([place])    Immune deficiency disorder [facility])     Patient Active Problem List   Diagnosis Date Noted   Adenomyosis 02/24/2021   Vulvovaginal candidiasis 01/16/2021   Dysmenorrhea 01/16/2021   Callus of foot 11/26/2020   Heavy menses 11/26/2020   History of 2019 novel coronavirus disease (COVID-19) 06/16/2019   Abnormal ECG 06/16/2019   Medication monitoring encounter 11/12/2017   Breast nodule 09/02/2017   Screening examination for venereal disease 03/30/2017   Encounter for long-term (current) use of high-risk medication 03/30/2017   Microalbuminuria due to type 2 diabetes mellitus ([place]) 03/30/2017   Knee pain, chronic 03/30/2017   Vitamin D deficiency 03/21/2017   Healthcare maintenance 03/02/2014   Morbid obesity with BMI of 45.0-49.9,  adult ([place]) 04/27/2012   Uncontrolled type 2 diabetes mellitus ([place]) 12/08/2010   Human immunodeficiency virus (HIV) disease ([place]) 09/29/2006    Past Surgical History:  Procedure Laterality Date   CESAREAN SECTION     x3   CHOLECYSTECTOMY  06/29/2011   Procedure: LAPAROSCOPIC CHOLECYSTECTOMY WITH INTRAOPERATIVE CHOLANGIOGRAM;  Surgeon: Merrie Roof, MD;  Location: [facility] OR;  Service: General;  Laterality: N/A;   TUBAL LIGATION       OB History     Gravida  3   Para  3   Term  3   Preterm      AB      Living  3      SAB      IAB      Ectopic      Multiple      Live Births  3        Obstetric Comments  c-section x 3. Last one with BTL done in the mid 2000s         Family History  Problem Relation Age of Onset   Healthy Mother    Healthy Father     Social History   Tobacco Use   Smoking status: Never   Smokeless tobacco: Never  Vaping Use   Vaping Use: Never used  Substance Use Topics   Alcohol use: No   Drug use: Yes    Types: Marijuana    Comment: CBD  Home Medications Prior to Admission medications   Medication Sig Start Date End Date Taking? Authorizing Provider  Accu-Chek Softclix Lancets lancets 1 each 3 (three) times daily. 02/06/21   [provider]  GENVOYA 150-150-200-10 MG TABS tablet TAKE 1 TABLET BY MOUTH DAILY WITH BREAKFAST. Patient taking differently: Take 1 tablet by mouth daily with breakfast. 09/02/18   Comer, Okey Regal, MD  glipiZIDE (GLUCOTROL) 10 MG tablet Take 1 tablet (10 mg total) by mouth 2 (two) times daily before a meal. 03/19/21   Vevelyn Francois, NP  glucose blood (ACCU-CHEK GUIDE) test strip Use as instructed 03/19/21   Vevelyn Francois, NP  glucose blood test strip Check your sugar in the morning before you eat breakfast, and one hour after a meal. 05/26/19   Melynda Ripple, MD  glucose monitoring kit (FREESTYLE) monitoring kit 1 each by Does not apply route daily. Check glucose once in the morning before  breakfast and 1 hour after a meal 02/05/21   Vevelyn Francois, NP  metFORMIN (GLUCOPHAGE) 500 MG tablet Take 2 tablets (1,000 mg total) by mouth 2 (two) times daily with a meal. 03/19/21 05/16/22  Vevelyn Francois, NP  sitaGLIPtin (JANUVIA) 100 MG tablet Take 1 tablet (100 mg total) by mouth daily. 03/19/21   Vevelyn Francois, NP  cetirizine (ZYRTEC ALLERGY) 10 MG tablet Take 1 tablet (10 mg total) by mouth daily. Patient not taking: No sig reported 11/26/19 11/08/20  Henderly, Britni A, PA-C    Allergies    Patient has no known allergies.  Review of Systems   Review of Systems  Constitutional:  Negative for chills and fever.  Musculoskeletal:  Positive for arthralgias and gait problem. Negative for back pain.  Neurological:  Negative for numbness.  All other systems reviewed and are negative.  Physical Exam Updated Vital Signs BP 121/81 (BP Location: Right Arm)   Pulse 90   Temp 97.7 F (36.5 C) (Oral)   Resp 18   SpO2 100%   Physical Exam Vitals and nursing note reviewed.  Constitutional:      General: She is not in acute distress.    Appearance: She is not ill-appearing.  HENT:     Head: Normocephalic.  Eyes:     Pupils: Pupils are equal, round, and reactive to light.  Cardiovascular:     Rate and Rhythm: Normal rate and regular rhythm.     Pulses: Normal pulses.     Heart sounds: Normal heart sounds. No murmur heard.   No friction rub. No gallop.  Pulmonary:     Effort: Pulmonary effort is normal.     Breath sounds: Normal breath sounds.  Abdominal:     General: Abdomen is flat. There is no distension.     Palpations: Abdomen is soft.     Tenderness: There is no abdominal tenderness. There is no guarding or rebound.  Musculoskeletal:        General: Normal range of motion.     Cervical back: Neck supple.     Comments: Mild tenderness to palpation throughout lateral aspect of right knee.  No erythema, edema, or warmth.  Right lower extremity neurovascularly intact with soft  compartments.  No lower extremity edema.  Negative Homan sign. No thoracic or lumbar midline tenderness  Skin:    General: Skin is warm and dry.  Neurological:     General: No focal deficit present.     Mental Status: She is alert.  Psychiatric:  Mood and Affect: Mood normal.        Behavior: Behavior normal.    ED Results / Procedures / Treatments   Labs (all labs ordered are listed, but only abnormal results are displayed) Labs Reviewed - No data to display  EKG None  Radiology No results found.  Procedures Procedures   Medications Ordered in ED Medications - No data to display  ED Course  I have reviewed the triage vital signs and the nursing notes.  Pertinent labs & imaging results that were available during my care of the patient were reviewed by me and considered in my medical decision making (see chart for details).    MDM Rules/Calculators/A&P                           45 year old female presents to the ED due to acute on chronic right knee pain.  No recent trauma.  Denies fever and chills.  Upon arrival, vitals all within normal limits.  Patient is afebrile, not tachycardic or hypoxic.  Patient in no acute distress.  Physical exam significant for mild tenderness throughout lateral aspect of right knee.  Right lower extremity neurovascularly intact with soft compartments.  Low suspicion for compartment syndrome.  No erythema, edema, or warmth.  Full range of motion of right knee.  Low suspicion for septic joint.  PERC negative and low risk using Wells criteria.  Low suspicion for PE/DVT.  X-ray ordered which I personally reviewed which is negative for any acute abnormalities.  Knee sleeve placed.  Crutches given.  Patient discharged with prescription for Tylenol.  Orthopedics number given to patient at discharge.  Advised patient to call if symptoms do not improve within the next week. RICE discussed with patient. Strict ED precautions discussed with patient.  Patient states understanding and agrees to plan. Patient discharged home in no acute distress and stable vitals.  Final Clinical Impression(s) / ED Diagnoses Final diagnoses:  Chronic pain of right knee    Rx / DC Orders ED Discharge Orders     None        Karie Kirks 04/24/21 1833    Drenda Freeze, MD 04/24/21 2252

## 2021-04-24 NOTE — ED Notes (Signed)
Rn reviewed discharge instructions with pt. Pt verbalized understanding and had no further questions. VSS upon discharge 

## 2021-04-25 ENCOUNTER — Telehealth: Payer: Self-pay

## 2021-04-25 NOTE — Telephone Encounter (Signed)
Transition Care Management Follow-up Telephone Call Date of discharge and from where: 04/24/2021-[place] How have you been since you were released from the hospital? Patient stated she is doing fine.  Any questions or concerns? No  Items Reviewed: Did the pt receive and understand the discharge instructions provided? Yes  Medications obtained and verified?  No medications given at discharge  Other? No  Any new allergies since your discharge? No  Dietary orders reviewed? N/A Do you have support at home? Yes   Home Care and Equipment/Supplies: Were home health services ordered? not applicable If so, what is the name of the agency? N/A  Has the agency set up a time to come to the patient's home? not applicable Were any new equipment or medical supplies ordered?  No What is the name of the medical supply agency? N/A Were you able to get the supplies/equipment? not applicable Do you have any questions related to the use of the equipment or supplies? No  Functional Questionnaire: (I = Independent and D = Dependent) ADLs: I  Bathing/Dressing- I  Meal Prep- I  Eating- I  Maintaining continence- I  Transferring/Ambulation- I  Managing Meds- I  Follow up appointments reviewed:  PCP Hospital f/u appt confirmed? No   Specialist Hospital f/u appt confirmed? No   Are transportation arrangements needed? No  If their condition worsens, is the pt aware to call PCP or go to the Emergency Dept.? Yes Was the patient provided with contact information for the PCP's office or ED? Yes Was to pt encouraged to call back with questions or concerns? Yes

## 2021-05-05 ENCOUNTER — Ambulatory Visit: Payer: Medicaid Other | Admitting: Internal Medicine

## 2021-05-15 ENCOUNTER — Ambulatory Visit: Payer: Medicaid Other | Admitting: Nurse Practitioner

## 2021-05-28 ENCOUNTER — Ambulatory Visit (INDEPENDENT_AMBULATORY_CARE_PROVIDER_SITE_OTHER): Payer: Medicaid Other | Admitting: Internal Medicine

## 2021-05-28 ENCOUNTER — Other Ambulatory Visit: Payer: Self-pay

## 2021-05-28 ENCOUNTER — Encounter: Payer: Self-pay | Admitting: Internal Medicine

## 2021-05-28 VITALS — BP 118/80 | HR 105 | Resp 16 | Ht 67.0 in | Wt 261.0 lb

## 2021-05-28 DIAGNOSIS — B20 Human immunodeficiency virus [HIV] disease: Secondary | ICD-10-CM | POA: Diagnosis not present

## 2021-05-28 DIAGNOSIS — E119 Type 2 diabetes mellitus without complications: Secondary | ICD-10-CM

## 2021-05-28 DIAGNOSIS — Z23 Encounter for immunization: Secondary | ICD-10-CM | POA: Insufficient documentation

## 2021-05-28 MED ORDER — GENVOYA 150-150-200-10 MG PO TABS
1.0000 | ORAL_TABLET | Freq: Every day | ORAL | 11 refills | Status: DC
Start: 2021-05-28 — End: 2022-06-02

## 2021-05-28 NOTE — Assessment & Plan Note (Signed)
She is doing well with Genvoya and no missed doses.  No new concerns and she can rtc in 6 months.

## 2021-05-28 NOTE — Assessment & Plan Note (Signed)
I will check her Hgb A1c to see if it is improving to help facilitate getting her podiatry needs

## 2021-05-28 NOTE — Progress Notes (Signed)
   Subjective:    Patient ID: Beverly Johnson, female    DOB: 1976/07/22, 45 y.o.   MRN: [id number]  HPI Here for follow up of HIV She continues on Genvoya with no missed doses.  No labs prior to the visit.  No issues taking tolerating or getting the medication.  She continues to have foot pain but podiatry waiting for improved diabetes management before considering surgery.     Review of Systems  Constitutional:  Negative for fatigue.  Gastrointestinal:  Negative for diarrhea and nausea.  Skin:  Negative for rash.      Objective:   Physical Exam Eyes:     General: No scleral icterus. Cardiovascular:     Rate and Rhythm: Normal rate.  Pulmonary:     Effort: Pulmonary effort is normal.  Neurological:     General: No focal deficit present.     Mental Status: She is alert.  Psychiatric:        Mood and Affect: Mood normal.   SH: no tobacco       Assessment & Plan:

## 2021-05-28 NOTE — Assessment & Plan Note (Signed)
I discussed the flu shot and she has refused.

## 2021-05-29 LAB — T-HELPER CELL (CD4) - (RCID CLINIC ONLY)
CD4 % Helper T Cell: 38 % (ref 33–65)
CD4 T Cell Abs: 674 /uL (ref 400–1790)

## 2021-05-30 LAB — HIV-1 RNA QUANT-NO REFLEX-BLD
HIV 1 RNA Quant: 96 Copies/mL — ABNORMAL HIGH
HIV-1 RNA Quant, Log: 1.98 Log cps/mL — ABNORMAL HIGH

## 2021-05-30 LAB — HEMOGLOBIN A1C
Hgb A1c MFr Bld: 12.3 % of total Hgb — ABNORMAL HIGH (ref <5.7)
Mean Plasma Glucose: 306 mg/dL
eAG (mmol/L): 17 mmol/L

## 2021-07-07 ENCOUNTER — Telehealth: Payer: Self-pay

## 2021-07-07 NOTE — Telephone Encounter (Signed)
Patient called wanting to leave a message to Dr. Linus Salmons or dr. Henreitta Leber nurse, she is requesting medication miconazole for a yeast infection be sent to  the pharmacy at [facility]. If you have any questions best contact number is with her husband Liddy Deam at [phone]

## 2021-07-07 NOTE — Telephone Encounter (Signed)
Patient called front desk requesting medication for yeast infection. RN contacted patient to request she reach out to her PCP for prescription. Patient stated she isn't sure when she'd be able to contact them and she hasn't seen them in "a while". RN advised that she would reach out to Dr. Linus Salmons but can't promise that he'll write prescription. Patient reports "fish smell", burning with urination and discomfort. No discharge noted. Has not tried anything OTC for treatment.   Ileen Kahre Lorita Officer, RN

## 2021-07-14 ENCOUNTER — Other Ambulatory Visit: Payer: Self-pay | Admitting: Internal Medicine

## 2021-07-14 MED ORDER — FLUCONAZOLE 150 MG PO TABS: 150.0000 mg | ORAL_TABLET | Freq: Every day | ORAL | 0 refills | Status: DC

## 2021-07-30 ENCOUNTER — Encounter (HOSPITAL_COMMUNITY): Payer: Self-pay

## 2021-07-30 ENCOUNTER — Other Ambulatory Visit: Payer: Self-pay

## 2021-07-30 ENCOUNTER — Ambulatory Visit (HOSPITAL_COMMUNITY)
Admission: EM | Admit: 2021-07-30 | Discharge: 2021-07-30 | Disposition: A | Discharge status: Home / Self Care | Payer: Medicaid Other | Attending: Internal Medicine | Admitting: Internal Medicine

## 2021-07-30 DIAGNOSIS — B20 Human immunodeficiency virus [HIV] disease: Secondary | ICD-10-CM | POA: Insufficient documentation

## 2021-07-30 DIAGNOSIS — N3000 Acute cystitis without hematuria: Secondary | ICD-10-CM | POA: Insufficient documentation

## 2021-07-30 DIAGNOSIS — R1030 Lower abdominal pain, unspecified: Secondary | ICD-10-CM | POA: Diagnosis not present

## 2021-07-30 DIAGNOSIS — E119 Type 2 diabetes mellitus without complications: Secondary | ICD-10-CM | POA: Diagnosis not present

## 2021-07-30 DIAGNOSIS — E118 Type 2 diabetes mellitus with unspecified complications: Secondary | ICD-10-CM

## 2021-07-30 LAB — POC URINE PREG, ED: Preg Test, Ur: NEGATIVE

## 2021-07-30 LAB — POCT URINALYSIS DIPSTICK, ED / UC
Bilirubin Urine: NEGATIVE
Glucose, UA: >=1000 mg/dL — AB
Hgb urine dipstick: NEGATIVE
Ketones, ur: NEGATIVE mg/dL
Leukocytes,Ua: NEGATIVE
Nitrite: POSITIVE — AB
Protein, ur: NEGATIVE mg/dL
Specific Gravity, Urine: 1.01 (ref 1.005–1.030)
Urobilinogen, UA: 0.2 mg/dL (ref 0.0–1.0)
pH: 5.5 (ref 5.0–8.0)

## 2021-07-30 MED ORDER — FLUCONAZOLE 150 MG PO TABS: 150.0000 mg | ORAL_TABLET | ORAL | 0 refills | Status: DC

## 2021-07-30 MED ORDER — CEPHALEXIN 500 MG PO CAPS: 500.0000 mg | ORAL_CAPSULE | Freq: Two times a day (BID) | ORAL | 0 refills | Status: DC

## 2021-07-30 NOTE — ED Provider Notes (Addendum)
[place]   MRN: [id number] DOB: [birth year]  Subjective:   Beverly Johnson is a 45 y.o. female presenting for 1 day history of acute onset lower abdominal pain, pelvic discomfort, vaginal irritation, low back pain, malodorous dark urine. Hydrates very well with water. Minimizes urinary irritants. Denies dysuria, urinary frequency. She has DM 2, treated without insulin.  Has not followed up with them anytime recently.  No current facility-administered medications for this encounter.  Current Outpatient Medications:    Accu-Chek Softclix Lancets lancets, 1 each 3 (three) times daily., Disp: , Rfl:    elvitegravir-cobicistat-emtricitabine-tenofovir (GENVOYA) 150-150-200-10 MG TABS tablet, Take 1 tablet by mouth daily with breakfast., Disp: 30 tablet, Rfl: 11   fluconazole (DIFLUCAN) 150 MG tablet, Take 1 tablet (150 mg total) by mouth daily., Disp: 1 tablet, Rfl: 0   glipiZIDE (GLUCOTROL) 10 MG tablet, Take 1 tablet (10 mg total) by mouth 2 (two) times daily before a meal., Disp: 60 tablet, Rfl: 3   glucose blood (ACCU-CHEK GUIDE) test strip, Use as instructed, Disp: 100 each, Rfl: 12   glucose blood test strip, Check your sugar in the morning before you eat breakfast, and one hour after a meal., Disp: 100 each, Rfl: 2   glucose monitoring kit (FREESTYLE) monitoring kit, 1 each by Does not apply route daily. Check glucose once in the morning before breakfast and 1 hour after a meal, Disp: 1 each, Rfl: 0   metFORMIN (GLUCOPHAGE) 500 MG tablet, Take 2 tablets (1,000 mg total) by mouth 2 (two) times daily with a meal., Disp: 120 tablet, Rfl: 11   sitaGLIPtin (JANUVIA) 100 MG tablet, Take 1 tablet (100 mg total) by mouth daily., Disp: 30 tablet, Rfl: 11   No Known Allergies  Past Medical History:  Diagnosis Date   DM2 (diabetes mellitus, type 2) ([place])    Gallstones 03/10/2011   HIV infection ([place])    Immune deficiency disorder ([place])      Past Surgical History:   Procedure Laterality Date   CESAREAN SECTION     x3   CHOLECYSTECTOMY  06/29/2011   Procedure: LAPAROSCOPIC CHOLECYSTECTOMY WITH INTRAOPERATIVE CHOLANGIOGRAM;  Surgeon: Luella Cook III, MD;  Location: [facility] OR;  Service: General;  Laterality: N/A;   TUBAL LIGATION      Family History  Problem Relation Age of Onset   Healthy Mother    Healthy Father     Social History   Tobacco Use   Smoking status: Never   Smokeless tobacco: Never  Vaping Use   Vaping Use: Never used  Substance Use Topics   Alcohol use: No   Drug use: Yes    Types: Marijuana    Comment: CBD    ROS   Objective:   Vitals: BP (!) 139/103 (BP Location: Left Arm)    Pulse 91    Temp 98.6 F (37 C) (Oral)    Resp 18    LMP 07/08/2021    SpO2 98%   Physical Exam Constitutional:      General: She is not in acute distress.    Appearance: Normal appearance. She is well-developed. She is not ill-appearing, toxic-appearing or diaphoretic.  HENT:     Head: Normocephalic and atraumatic.     Nose: Nose normal.     Mouth/Throat:     Mouth: Mucous membranes are moist.     Pharynx: Oropharynx is clear.  Eyes:     General: No scleral icterus.       Right  eye: No discharge.        Left eye: No discharge.     Extraocular Movements: Extraocular movements intact.     Conjunctiva/sclera: Conjunctivae normal.     Pupils: Pupils are equal, round, and reactive to light.  Cardiovascular:     Rate and Rhythm: Normal rate.  Pulmonary:     Effort: Pulmonary effort is normal.  Abdominal:     General: Bowel sounds are normal. There is no distension.     Palpations: Abdomen is soft. There is no mass.     Tenderness: There is abdominal tenderness (lower abdomen, suprapubic). There is no right CVA tenderness, left CVA tenderness, guarding or rebound.  Skin:    General: Skin is warm and dry.  Neurological:     General: No focal deficit present.     Mental Status: She is alert and oriented to person, place, and time.   Psychiatric:        Mood and Affect: Mood normal.        Behavior: Behavior normal.        Thought Content: Thought content normal.        Judgment: Judgment normal.    Results for orders placed or performed during the hospital encounter of 07/30/21 (from the past 24 hour(s))  POC Urinalysis dipstick     Status: Abnormal   Collection Time: 07/30/21  4:42 PM  Result Value Ref Range   Glucose, UA >=1000 (A) NEGATIVE mg/dL   Bilirubin Urine NEGATIVE NEGATIVE   Ketones, ur NEGATIVE NEGATIVE mg/dL   Specific Gravity, Urine 1.010 1.005 - 1.030   Hgb urine dipstick NEGATIVE NEGATIVE   pH 5.5 5.0 - 8.0   Protein, ur NEGATIVE NEGATIVE mg/dL   Urobilinogen, UA 0.2 0.0 - 1.0 mg/dL   Nitrite POSITIVE (A) NEGATIVE   Leukocytes,Ua NEGATIVE NEGATIVE    Assessment and Plan :   PDMP not reviewed this encounter.  1. Acute cystitis without hematuria   2. Lower abdominal pain   3. Type 2 diabetes mellitus treated without insulin ([place])   4. HIV disease ([place])    Start Keflex to cover for acute cystitis, urine culture pending.  Recommended aggressive hydration, limiting urinary irritants. Emphasized need for follow up with her PCP for a recheck on her diabetes.  We will also cover for yeast vaginitis due to her uncontrolled diabetes and HIV disease.  Patient refused STI testing.  Counseled patient on potential for adverse effects with medications prescribed/recommended today, ER and return-to-clinic precautions discussed, patient verbalized understanding.     Jaynee Eagles, PA-C 07/30/21 [id number]

## 2021-07-30 NOTE — Discharge Instructions (Signed)

## 2021-07-30 NOTE — ED Triage Notes (Signed)
Pt c/o sharp lower abdominal pain shooting through to her lower back today. States has a strong urine odor and vaginal irritation.

## 2021-07-31 LAB — CERVICOVAGINAL ANCILLARY ONLY
Bacterial Vaginitis (gardnerella): POSITIVE — AB
Candida Glabrata: NEGATIVE
Candida Vaginitis: POSITIVE — AB
Comment: NEGATIVE
Comment: NEGATIVE
Comment: NEGATIVE

## 2021-08-01 ENCOUNTER — Telehealth (HOSPITAL_COMMUNITY): Payer: Self-pay | Admitting: Emergency Medicine

## 2021-08-01 LAB — URINE CULTURE: Culture: 90000 — AB

## 2021-08-01 MED ORDER — METRONIDAZOLE 500 MG PO TABS: 500.0000 mg | ORAL_TABLET | Freq: Two times a day (BID) | ORAL | 0 refills | Status: DC

## 2021-08-16 ENCOUNTER — Ambulatory Visit (HOSPITAL_COMMUNITY)
Admission: EM | Admit: 2021-08-16 | Discharge: 2021-08-16 | Disposition: A | Discharge status: Home / Self Care | Payer: Medicaid Other | Attending: Physician Assistant | Admitting: Physician Assistant

## 2021-08-16 ENCOUNTER — Other Ambulatory Visit: Payer: Self-pay

## 2021-08-16 DIAGNOSIS — R102 Pelvic and perineal pain: Secondary | ICD-10-CM | POA: Diagnosis not present

## 2021-08-16 DIAGNOSIS — N730 Acute parametritis and pelvic cellulitis: Secondary | ICD-10-CM | POA: Diagnosis not present

## 2021-08-16 LAB — POCT URINALYSIS DIPSTICK, ED / UC
Bilirubin Urine: NEGATIVE
Glucose, UA: >=1000 mg/dL — AB
Ketones, ur: NEGATIVE mg/dL
Nitrite: NEGATIVE
Protein, ur: NEGATIVE mg/dL
Specific Gravity, Urine: 1.02 (ref 1.005–1.030)
Urobilinogen, UA: 0.2 mg/dL (ref 0.0–1.0)
pH: 5.5 (ref 5.0–8.0)

## 2021-08-16 LAB — POC URINE PREG, ED: Preg Test, Ur: NEGATIVE

## 2021-08-16 MED ORDER — METRONIDAZOLE 500 MG PO TABS: 500.0000 mg | ORAL_TABLET | Freq: Two times a day (BID) | ORAL | 0 refills | Status: AC

## 2021-08-16 MED ORDER — LIDOCAINE HCL (PF) 1 % IJ SOLN
INTRAMUSCULAR | Status: AC
  Filled 2021-08-16: qty 2

## 2021-08-16 MED ORDER — CEFTRIAXONE SODIUM 1 G IJ SOLR
INTRAMUSCULAR | Status: AC
  Filled 2021-08-16: qty 10

## 2021-08-16 MED ORDER — DOXYCYCLINE HYCLATE 100 MG PO CAPS: 100.0000 mg | ORAL_CAPSULE | Freq: Two times a day (BID) | ORAL | 0 refills | Status: AC

## 2021-08-16 MED ORDER — CEFTRIAXONE SODIUM 1 G IJ SOLR
1.0000 g | Freq: Once | INTRAMUSCULAR | Status: AC
  Administered 2021-08-16: 1 g via INTRAMUSCULAR

## 2021-08-16 MED ORDER — CEFTRIAXONE SODIUM 500 MG IJ SOLR: 500.0000 mg | Freq: Once | INTRAMUSCULAR | Status: DC

## 2021-08-16 NOTE — Discharge Instructions (Signed)
You get injection of antibiotics today.  Start doxycycline 100 mg twice daily and metronidazole 500 mg twice daily for 2 weeks.  We will contact you if need to make any changes to your treatment regimen based on your results.  Follow-up with your PCP and/or OB/GYN first thing next week.  If you have any worsening symptoms you need to be reevaluated immediately as we discussed.

## 2021-08-16 NOTE — ED Notes (Signed)
Patient left prior to receiving papers had to catch bus.  States will return for papers and work note

## 2021-08-16 NOTE — ED Provider Notes (Signed)
[place]    CSN: [id number] Arrival date & time: 08/16/21  1113      History   Chief Complaint Chief Complaint  Patient presents with   Follow-up    Back pain     HPI Beverly Johnson is a 45 y.o. female.   Patient presents today for follow-up of pelvic/back pain.  She was seen on 07/30/2021 at which point she was treated for UTI with Keflex and urine grew E. coli sensitive to this medication.  Swab was collected that showed yeast and bacterial vaginosis and appropriate medications were sent to pharmacy.  She reports the pain has continued despite these medication regimens.  She continues to have severe lower abdominal pain/pelvic pain with radiation to left side of back.  Pain is rated 10 on a 0-10 pain scale, localized to lower abdomen with radiation to left lower back, described as intense pressure, no alleviating factors identified.  She is not taking any over-the-counter medication for symptom management.  She denies additional antibiotic use besides what was prescribed by our clinic.  She denies any fever, nausea, vomiting, diarrhea, vaginal discharge.  She denies history of recurrent UTI, self-catheterization, recent urogenital procedure.  She does report a history of nephrolithiasis but reports she has not struggled with this since intervention several years ago.   Past Medical History:  Diagnosis Date   DM2 (diabetes mellitus, type 2) ([place])    Gallstones 03/10/2011   HIV infection ([place])    Immune deficiency disorder [facility])     Patient Active Problem List   Diagnosis Date Noted   Need for prophylactic vaccination and inoculation against influenza 05/28/2021   Adenomyosis 02/24/2021   Vulvovaginal candidiasis 01/16/2021   Dysmenorrhea 01/16/2021   Callus of foot 11/26/2020   Heavy menses 11/26/2020   History of 2019 novel coronavirus disease (COVID-19) 06/16/2019   Abnormal ECG 06/16/2019   Medication monitoring encounter 11/12/2017   Breast nodule  09/02/2017   Screening examination for venereal disease 03/30/2017   Encounter for long-term (current) use of high-risk medication 03/30/2017   Microalbuminuria due to type 2 diabetes mellitus ([place]) 03/30/2017   Knee pain, chronic 03/30/2017   Vitamin D deficiency 03/21/2017   Healthcare maintenance 03/02/2014   Morbid obesity with BMI of 45.0-49.9, adult ([place]) 04/27/2012   Type 2 diabetes mellitus without complications ([place]) 54/00/8676   Human immunodeficiency virus (HIV) disease ([place]) 09/29/2006    Past Surgical History:  Procedure Laterality Date   CESAREAN SECTION     x3   CHOLECYSTECTOMY  06/29/2011   Procedure: LAPAROSCOPIC CHOLECYSTECTOMY WITH INTRAOPERATIVE CHOLANGIOGRAM;  Surgeon: Merrie Roof, MD;  Location: [facility] OR;  Service: General;  Laterality: N/A;   TUBAL LIGATION      OB History     Gravida  3   Para  3   Term  3   Preterm      AB      Living  3      SAB      IAB      Ectopic      Multiple      Live Births  3        Obstetric Comments  c-section x 3. Last one with BTL done in the mid 2000s          Home Medications    Prior to Admission medications   Medication Sig Start Date End Date Taking? Authorizing Provider  doxycycline (VIBRAMYCIN) 100 MG capsule Take 1 capsule (100 mg  total) by mouth 2 (two) times daily for 14 days. 08/16/21 08/30/21 Yes Raylinn Kosar K, PA-C  metroNIDAZOLE (FLAGYL) 500 MG tablet Take 1 tablet (500 mg total) by mouth 2 (two) times daily for 14 days. 08/16/21 08/30/21 Yes Aydin Cavalieri K, PA-C  Accu-Chek Softclix Lancets lancets 1 each 3 (three) times daily. 02/06/21   [provider]  elvitegravir-cobicistat-emtricitabine-tenofovir (GENVOYA) 150-150-200-10 MG TABS tablet Take 1 tablet by mouth daily with breakfast. 05/28/21   Comer, Okey Regal, MD  fluconazole (DIFLUCAN) 150 MG tablet Take 1 tablet (150 mg total) by mouth once a week. 07/30/21   Jaynee Eagles, PA-C  glipiZIDE (GLUCOTROL) 10 MG tablet Take 1  tablet (10 mg total) by mouth 2 (two) times daily before a meal. 03/19/21   Vevelyn Francois, NP  glucose blood (ACCU-CHEK GUIDE) test strip Use as instructed 03/19/21   Vevelyn Francois, NP  glucose blood test strip Check your sugar in the morning before you eat breakfast, and one hour after a meal. 05/26/19   Melynda Ripple, MD  glucose monitoring kit (FREESTYLE) monitoring kit 1 each by Does not apply route daily. Check glucose once in the morning before breakfast and 1 hour after a meal 02/05/21   Vevelyn Francois, NP  metFORMIN (GLUCOPHAGE) 500 MG tablet Take 2 tablets (1,000 mg total) by mouth 2 (two) times daily with a meal. 03/19/21 05/16/22  Vevelyn Francois, NP  sitaGLIPtin (JANUVIA) 100 MG tablet Take 1 tablet (100 mg total) by mouth daily. 03/19/21   Vevelyn Francois, NP  cetirizine (ZYRTEC ALLERGY) 10 MG tablet Take 1 tablet (10 mg total) by mouth daily. Patient not taking: No sig reported 11/26/19 11/08/20  Henderly, Britni A, PA-C    Family History Family History  Problem Relation Age of Onset   Healthy Mother    Healthy Father     Social History Social History   Tobacco Use   Smoking status: Never   Smokeless tobacco: Never  Vaping Use   Vaping Use: Never used  Substance Use Topics   Alcohol use: No   Drug use: Yes    Types: Marijuana    Comment: CBD     Allergies   Patient has no known allergies.   Review of Systems Review of Systems  Constitutional:  Positive for activity change and fatigue. Negative for appetite change and fever.  Respiratory:  Negative for cough and shortness of breath.   Cardiovascular:  Negative for chest pain.  Gastrointestinal:  Positive for abdominal pain. Negative for diarrhea, nausea and vomiting.  Genitourinary:  Positive for flank pain, frequency, pelvic pain and vaginal pain. Negative for dysuria, vaginal bleeding and vaginal discharge.  Musculoskeletal:  Positive for back pain. Negative for arthralgias and myalgias.  Neurological:   Negative for dizziness, light-headedness and headaches.    Physical Exam Triage Vital Signs ED Triage Vitals [08/16/21 1145]  Enc Vitals Group     BP 123/81     Pulse Rate 87     Resp 18     Temp 98.3 F (36.8 C)     Temp Source Oral     SpO2 100 %     Weight      Height      Head Circumference      Peak Flow      Pain Score 10     Pain Loc      Pain Edu?      Excl. in [place]?    No data found.  Updated Vital Signs BP 123/81 (BP Location: Left Arm)    Pulse 87    Temp 98.3 F (36.8 C) (Oral)    Resp 18    LMP 08/15/2021 (Approximate)    SpO2 100%   Visual Acuity Right Eye Distance:   Left Eye Distance:   Bilateral Distance:    Right Eye Near:   Left Eye Near:    Bilateral Near:     Physical Exam Vitals reviewed. Exam conducted with a chaperone present.  Constitutional:      General: She is awake. She is not in acute distress.    Appearance: Normal appearance. She is well-developed. She is not ill-appearing.     Comments: Very pleasant female appears stated age in no acute distress sitting comfortably in exam room  HENT:     Head: Normocephalic and atraumatic.  Cardiovascular:     Rate and Rhythm: Normal rate and regular rhythm.     Heart sounds: Normal heart sounds, S1 normal and S2 normal. No murmur heard. Pulmonary:     Effort: Pulmonary effort is normal.     Breath sounds: Normal breath sounds. No wheezing, rhonchi or rales.     Comments: Clear to auscultation bilaterally Abdominal:     General: Bowel sounds are normal.     Palpations: Abdomen is soft.     Tenderness: There is abdominal tenderness in the suprapubic area and left lower quadrant. There is no right CVA tenderness, left CVA tenderness, guarding or rebound.     Comments: Tender to palpation in lower abdomen.  No evidence of acute abdomen on physical exam  Genitourinary:    Labia:        Right: No rash or tenderness.        Left: No rash or tenderness.      Vagina: Tenderness present. No vaginal  discharge, erythema or bleeding.     Cervix: Cervical motion tenderness present.     Uterus: Normal.      Adnexa:        Right: No mass or tenderness.         Left: No mass or tenderness.       Comments: Kim RN present as chaperone during exam.  Patient had difficulty tolerating small speculum as well as some CMT. Musculoskeletal:     Cervical back: No tenderness or bony tenderness.     Thoracic back: No tenderness or bony tenderness.     Lumbar back: No tenderness or bony tenderness.  Psychiatric:        Behavior: Behavior is cooperative.     UC Treatments / Results  Labs (all labs ordered are listed, but only abnormal results are displayed) Labs Reviewed  POCT URINALYSIS DIPSTICK, ED / UC - Abnormal; Notable for the following components:      Result Value   Glucose, UA >=1000 (*)    Hgb urine dipstick TRACE (*)    Leukocytes,Ua SMALL (*)    All other components within normal limits  URINE CULTURE  POC URINE PREG, ED  CERVICOVAGINAL ANCILLARY ONLY    EKG   Radiology No results found.  Procedures Procedures (including critical care time)  Medications Ordered in UC Medications  cefTRIAXone (ROCEPHIN) injection 1 g (1 g Intramuscular Given 08/16/21 1231)    Initial Impression / Assessment and Plan / UC Course  I have reviewed the triage vital signs and the nursing notes.  Pertinent labs & imaging results that were available during my care of the patient were  reviewed by me and considered in my medical decision making (see chart for details).     UA showed small leukocyte esterase with glucose which is expected given use of Januvia but no nitrates.  We will send this for culture but defer additional antibiotics until culture results are obtained.  Patient did have significant pelvic tenderness on exam so will empirically treat for PID.  She was given 1 g of Rocephin in clinic and started on doxycycline and metronidazole outpatient.  Swab was collected today-results  pending.  She is nontoxic-appearing, afebrile, nontachycardic so we will offer outpatient treatment but discussed if she has any worsening symptoms including severe pain or fever she needs to go to the emergency room.  Recommended close follow-up with OB/GYN and she was given contact information for local group with instruction to call to schedule an appointment with them first thing next week.  Final Clinical Impressions(s) / UC Diagnoses   Final diagnoses:  Pelvic pain  PID (acute pelvic inflammatory disease)     Discharge Instructions      You get injection of antibiotics today.  Start doxycycline 100 mg twice daily and metronidazole 500 mg twice daily for 2 weeks.  We will contact you if need to make any changes to your treatment regimen based on your results.  Follow-up with your PCP and/or OB/GYN first thing next week.  If you have any worsening symptoms you need to be reevaluated immediately as we discussed.     ED Prescriptions     Medication Sig Dispense Auth. Provider   doxycycline (VIBRAMYCIN) 100 MG capsule Take 1 capsule (100 mg total) by mouth 2 (two) times daily for 14 days. 28 capsule Pascale Maves K, PA-C   metroNIDAZOLE (FLAGYL) 500 MG tablet Take 1 tablet (500 mg total) by mouth 2 (two) times daily for 14 days. 28 tablet Jesi Jurgens, Derry Skill, PA-C      PDMP not reviewed this encounter.   Terrilee Croak, PA-C 08/16/21 1240

## 2021-08-16 NOTE — ED Triage Notes (Signed)
Pt presents to office for 2 week follow up on abdominal and back pain. She did complete the course of medication, however she is still in pain.

## 2021-08-17 LAB — URINE CULTURE: Culture: NO GROWTH

## 2021-08-19 LAB — CERVICOVAGINAL ANCILLARY ONLY
Bacterial Vaginitis (gardnerella): POSITIVE — AB
Candida Glabrata: NEGATIVE
Candida Vaginitis: NEGATIVE
Chlamydia: NEGATIVE
Comment: NEGATIVE
Comment: NEGATIVE
Comment: NEGATIVE
Comment: NEGATIVE
Comment: NEGATIVE
Comment: NORMAL
Neisseria Gonorrhea: NEGATIVE
Trichomonas: NEGATIVE

## 2021-08-20 ENCOUNTER — Telehealth (HOSPITAL_COMMUNITY): Payer: Self-pay

## 2021-09-24 ENCOUNTER — Telehealth: Payer: Self-pay

## 2021-09-24 NOTE — Telephone Encounter (Signed)
Received voicemail from patient requesting appt for Migraine. Stated that she has been having them for one week now. Attempted to call patient back, but not able to reach her at this time.  Leatrice Jewels, RMA

## 2021-10-13 ENCOUNTER — Other Ambulatory Visit: Payer: Self-pay

## 2021-10-13 ENCOUNTER — Encounter (HOSPITAL_COMMUNITY): Payer: Self-pay | Admitting: Emergency Medicine

## 2021-10-13 ENCOUNTER — Ambulatory Visit (HOSPITAL_COMMUNITY)
Admission: EM | Admit: 2021-10-13 | Discharge: 2021-10-13 | Disposition: A | Discharge status: Home / Self Care | Payer: Medicaid Other | Attending: Internal Medicine | Admitting: Internal Medicine

## 2021-10-13 DIAGNOSIS — M7918 Myalgia, other site: Secondary | ICD-10-CM

## 2021-10-13 LAB — CBC WITH DIFFERENTIAL/PLATELET
Abs Immature Granulocytes: 0.02 10*3/uL (ref 0.00–0.07)
Basophils Absolute: 0 10*3/uL (ref 0.0–0.1)
Basophils Relative: 0 %
Eosinophils Absolute: 0 10*3/uL (ref 0.0–0.5)
Eosinophils Relative: 0 %
HCT: 38.4 % (ref 36.0–46.0)
Hemoglobin: 11.7 g/dL — ABNORMAL LOW (ref 12.0–15.0)
Immature Granulocytes: 0 %
Lymphocytes Relative: 38 %
Lymphs Abs: 2.1 10*3/uL (ref 0.7–4.0)
MCH: 22 pg — ABNORMAL LOW (ref 26.0–34.0)
MCHC: 30.5 g/dL (ref 30.0–36.0)
MCV: 72.2 fL — ABNORMAL LOW (ref 80.0–100.0)
Monocytes Absolute: 0.7 10*3/uL (ref 0.1–1.0)
Monocytes Relative: 12 %
Neutro Abs: 2.6 10*3/uL (ref 1.7–7.7)
Neutrophils Relative %: 50 %
Platelets: 231 10*3/uL (ref 150–400)
RBC: 5.32 MIL/uL — ABNORMAL HIGH (ref 3.87–5.11)
RDW: 14.8 % (ref 11.5–15.5)
WBC: 5.4 10*3/uL (ref 4.0–10.5)
nRBC: 0 % (ref 0.0–0.2)

## 2021-10-13 LAB — COMPREHENSIVE METABOLIC PANEL
ALT: 16 U/L (ref 0–44)
AST: 19 U/L (ref 15–41)
Albumin: 3.4 g/dL — ABNORMAL LOW (ref 3.5–5.0)
Alkaline Phosphatase: 131 U/L — ABNORMAL HIGH (ref 38–126)
Anion gap: 10 (ref 5–15)
BUN: 12 mg/dL (ref 6–20)
CO2: 24 mmol/L (ref 22–32)
Calcium: 9.4 mg/dL (ref 8.9–10.3)
Chloride: 95 mmol/L — ABNORMAL LOW (ref 98–111)
Creatinine, Ser: 0.93 mg/dL (ref 0.44–1.00)
GFR, Estimated: >60 mL/min (ref >60)
Glucose, Bld: 436 mg/dL — ABNORMAL HIGH (ref 70–99)
Potassium: 4.6 mmol/L (ref 3.5–5.1)
Sodium: 129 mmol/L — ABNORMAL LOW (ref 135–145)
Total Bilirubin: 0.6 mg/dL (ref 0.3–1.2)
Total Protein: 8.9 g/dL — ABNORMAL HIGH (ref 6.5–8.1)

## 2021-10-13 LAB — CK: Total CK: 129 U/L (ref 38–234)

## 2021-10-13 MED ORDER — METHOCARBAMOL 500 MG PO TABS: 500.0000 mg | ORAL_TABLET | Freq: Every evening | ORAL | 0 refills | Status: DC | PRN

## 2021-10-13 MED ORDER — TRAMADOL HCL 50 MG PO TABS
50.0000 mg | ORAL_TABLET | Freq: Four times a day (QID) | ORAL | 0 refills | Status: DC | PRN
Start: 2021-10-13 — End: 2022-11-11

## 2021-10-13 NOTE — ED Provider Notes (Signed)
[place]    CSN: [id number] Arrival date & time: 10/13/21  1622      History   Chief Complaint Chief Complaint  Patient presents with   Chest Pain    HPI Beverly Johnson is a 46 y.o. female comes to the urgent care with right upper back pain as well as right upper extremity pain mainly in the right upper arm area.  Symptoms have been ongoing for the past 2 months.  She describes the pain as sharp, severe, aggravated by movement and palpation.  No bruising or trauma to the right upper extremity.  Patient denies any numbness or tingling.  No weakness in the right upper extremity.  No known relieving factors.  She denies heavy lifting.  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  No changes in medications.  No recent upper respiratory infection symptoms.  No rash noted.Marland Kitchen   HPI  Past Medical History:  Diagnosis Date   DM2 (diabetes mellitus, type 2) ([place])    Gallstones 03/10/2011   HIV infection ([place])    Immune deficiency disorder [facility])     Patient Active Problem List   Diagnosis Date Noted   Need for prophylactic vaccination and inoculation against influenza 05/28/2021   Adenomyosis 02/24/2021   Vulvovaginal candidiasis 01/16/2021   Dysmenorrhea 01/16/2021   Callus of foot 11/26/2020   Heavy menses 11/26/2020   History of 2019 novel coronavirus disease (COVID-19) 06/16/2019   Abnormal ECG 06/16/2019   Medication monitoring encounter 11/12/2017   Breast nodule 09/02/2017   Screening examination for venereal disease 03/30/2017   Encounter for long-term (current) use of high-risk medication 03/30/2017   Microalbuminuria due to type 2 diabetes mellitus ([place]) 03/30/2017   Knee pain, chronic 03/30/2017   Vitamin D deficiency 03/21/2017   Healthcare maintenance 03/02/2014   Morbid obesity with BMI of 45.0-49.9, adult ([place]) 04/27/2012   Type 2 diabetes mellitus without complications ([place]) 56/38/9373   Human immunodeficiency virus (HIV) disease ([place])  09/29/2006    Past Surgical History:  Procedure Laterality Date   CESAREAN SECTION     x3   CHOLECYSTECTOMY  06/29/2011   Procedure: LAPAROSCOPIC CHOLECYSTECTOMY WITH INTRAOPERATIVE CHOLANGIOGRAM;  Surgeon: Merrie Roof, MD;  Location: [facility] OR;  Service: General;  Laterality: N/A;   TUBAL LIGATION      OB History     Gravida  3   Para  3   Term  3   Preterm      AB      Living  3      SAB      IAB      Ectopic      Multiple      Live Births  3        Obstetric Comments  c-section x 3. Last one with BTL done in the mid 2000s          Home Medications    Prior to Admission medications   Medication Sig Start Date End Date Taking? Authorizing Provider  methocarbamol (ROBAXIN) 500 MG tablet Take 1 tablet (500 mg total) by mouth at bedtime as needed for muscle spasms. 10/13/21  Yes Arya Luttrull, Myrene Galas, MD  traMADol (ULTRAM) 50 MG tablet Take 1 tablet (50 mg total) by mouth every 6 (six) hours as needed. 10/13/21  Yes Charleene Callegari, Myrene Galas, MD  Accu-Chek Softclix Lancets lancets 1 each 3 (three) times daily. 02/06/21   [provider]  elvitegravir-cobicistat-emtricitabine-tenofovir (GENVOYA) 150-150-200-10 MG TABS tablet  Take 1 tablet by mouth daily with breakfast. 05/28/21   Comer, Okey Regal, MD  glipiZIDE (GLUCOTROL) 10 MG tablet Take 1 tablet (10 mg total) by mouth 2 (two) times daily before a meal. 03/19/21   Vevelyn Francois, NP  glucose blood (ACCU-CHEK GUIDE) test strip Use as instructed 03/19/21   Vevelyn Francois, NP  glucose blood test strip Check your sugar in the morning before you eat breakfast, and one hour after a meal. 05/26/19   Melynda Ripple, MD  glucose monitoring kit (FREESTYLE) monitoring kit 1 each by Does not apply route daily. Check glucose once in the morning before breakfast and 1 hour after a meal 02/05/21   Vevelyn Francois, NP  metFORMIN (GLUCOPHAGE) 500 MG tablet Take 2 tablets (1,000 mg total) by mouth 2 (two) times daily with a meal.  03/19/21 05/16/22  Vevelyn Francois, NP  sitaGLIPtin (JANUVIA) 100 MG tablet Take 1 tablet (100 mg total) by mouth daily. 03/19/21   Vevelyn Francois, NP  cetirizine (ZYRTEC ALLERGY) 10 MG tablet Take 1 tablet (10 mg total) by mouth daily. Patient not taking: No sig reported 11/26/19 11/08/20  Henderly, Britni A, PA-C    Family History Family History  Problem Relation Age of Onset   Healthy Mother    Healthy Father     Social History Social History   Tobacco Use   Smoking status: Never   Smokeless tobacco: Never  Vaping Use   Vaping Use: Never used  Substance Use Topics   Alcohol use: No   Drug use: Not Currently    Types: Marijuana    Comment: CBD     Allergies   Patient has no known allergies.   Review of Systems Review of Systems  HENT: Negative.    Cardiovascular: Negative.   Gastrointestinal: Negative.   Genitourinary: Negative.   Musculoskeletal:  Positive for back pain and myalgias. Negative for arthralgias, gait problem, joint swelling and neck pain.    Physical Exam Triage Vital Signs ED Triage Vitals  Enc Vitals Group     BP 10/13/21 1825 109/77     Pulse Rate 10/13/21 1825 (!) 112     Resp 10/13/21 1825 18     Temp 10/13/21 1825 99.6 F (37.6 C)     Temp Source 10/13/21 1825 Oral     SpO2 10/13/21 1825 94 %     Weight --      Height --      Head Circumference --      Peak Flow --      Pain Score 10/13/21 1823 10     Pain Loc --      Pain Edu? --      Excl. in [place]? --    No data found.  Updated Vital Signs BP 109/77    Pulse (!) 112    Temp 99.6 F (37.6 C) (Oral)    Resp 18    LMP 09/07/2021    SpO2 94%   Visual Acuity Right Eye Distance:   Left Eye Distance:   Bilateral Distance:    Right Eye Near:   Left Eye Near:    Bilateral Near:     Physical Exam Vitals and nursing note reviewed.  Constitutional:      General: She is in acute distress.     Appearance: She is not diaphoretic.  Cardiovascular:     Heart sounds: Normal heart  sounds.  Pulmonary:     Effort: Pulmonary effort is normal.  Breath sounds: No decreased breath sounds, wheezing or rhonchi.  Chest:     Chest wall: No mass.  Abdominal:     General: Bowel sounds are normal.     Palpations: Abdomen is soft.  Musculoskeletal:     Comments: Tenderness on palpation over the right trapezius muscle, biceps and triceps.  No bruising noted.  No masses palpated.  Power is 5/5 in right upper extremity.  No known relieving factors.  Neurological:     Mental Status: She is alert.     UC Treatments / Results  Labs (all labs ordered are listed, but only abnormal results are displayed) Labs Reviewed  CBC WITH DIFFERENTIAL/PLATELET - Abnormal; Notable for the following components:      Result Value   RBC 5.32 (*)    Hemoglobin 11.7 (*)    MCV 72.2 (*)    MCH 22.0 (*)    All other components within normal limits  COMPREHENSIVE METABOLIC PANEL  CK    EKG   Radiology No results found.  Procedures Procedures (including critical care time)  Medications Ordered in UC Medications - No data to display  Initial Impression / Assessment and Plan / UC Course  I have reviewed the triage vital signs and the nursing notes.  Pertinent labs & imaging results that were available during my care of the patient were reviewed by me and considered in my medical decision making (see chart for details).     1.  Right upper back muscle pain: Concern for myositis CBC, CMP, creatinine kinase level Tramadol as needed for pain Patient advised to maintain adequate hydration Muscle relaxers as needed for muscle spasms Return precautions given. Final Clinical Impressions(s) / UC Diagnoses   Final diagnoses:  Myalgia, upper arm     Discharge Instructions      This take medications as prescribed Please do not drive or operate heavy machinery after taking muscle relaxants.  They may make you drowsy. We will call you with recommendations if labs are abnormal If  right upper extremity muscle pain persist, you may benefit from further work-up in the rheumatologist office.   ED Prescriptions     Medication Sig Dispense Auth. Provider   traMADol (ULTRAM) 50 MG tablet Take 1 tablet (50 mg total) by mouth every 6 (six) hours as needed. 15 tablet Trinidi Toppins, Myrene Galas, MD   methocarbamol (ROBAXIN) 500 MG tablet Take 1 tablet (500 mg total) by mouth at bedtime as needed for muscle spasms. 20 tablet Lurene Robley, Myrene Galas, MD      I have reviewed the PDMP during this encounter.   Chase Picket, MD 10/13/21 [phone]

## 2021-10-13 NOTE — Discharge Instructions (Addendum)
This take medications as prescribed Please do not drive or operate heavy machinery after taking muscle relaxants.  They may make you drowsy. We will call you with recommendations if labs are abnormal If right upper extremity muscle pain persist, you may benefit from further work-up in the rheumatologist office.

## 2021-10-13 NOTE — ED Triage Notes (Signed)
Patient has been vomiting since 10/09/2021.  Patient reports vomiting every day.  Denies diarrhea.  Patient has epigastric pain, sometimes pain in right arm

## 2021-10-14 ENCOUNTER — Other Ambulatory Visit: Payer: Self-pay | Admitting: Nurse Practitioner

## 2021-10-14 DIAGNOSIS — E1165 Type 2 diabetes mellitus with hyperglycemia: Secondary | ICD-10-CM

## 2021-10-17 ENCOUNTER — Encounter: Payer: Self-pay | Admitting: Nurse Practitioner

## 2021-10-17 ENCOUNTER — Ambulatory Visit (INDEPENDENT_AMBULATORY_CARE_PROVIDER_SITE_OTHER): Payer: Medicaid Other | Admitting: Nurse Practitioner

## 2021-10-17 ENCOUNTER — Other Ambulatory Visit: Payer: Self-pay

## 2021-10-17 VITALS — BP 125/87 | HR 111 | Temp 98.2°F | Ht 67.0 in | Wt 247.8 lb

## 2021-10-17 DIAGNOSIS — E1165 Type 2 diabetes mellitus with hyperglycemia: Secondary | ICD-10-CM

## 2021-10-17 DIAGNOSIS — B20 Human immunodeficiency virus [HIV] disease: Secondary | ICD-10-CM | POA: Diagnosis not present

## 2021-10-17 DIAGNOSIS — Z6841 Body Mass Index (BMI) 40.0 and over, adult: Secondary | ICD-10-CM

## 2021-10-17 DIAGNOSIS — M792 Neuralgia and neuritis, unspecified: Secondary | ICD-10-CM | POA: Diagnosis not present

## 2021-10-17 LAB — POCT GLYCOSYLATED HEMOGLOBIN (HGB A1C)
HbA1c POC (<> result, manual entry): 14 % (ref 4.0–5.6)
HbA1c, POC (controlled diabetic range): 14 % — AB (ref 0.0–7.0)
HbA1c, POC (prediabetic range): 14 % — AB (ref 5.7–6.4)
Hemoglobin A1C: 14 % — AB (ref 4.0–5.6)

## 2021-10-17 MED ORDER — INSULIN GLARGINE 100 UNIT/ML SOLOSTAR PEN: 20.0000 [IU] | PEN_INJECTOR | Freq: Every day | SUBCUTANEOUS | 11 refills | Status: DC

## 2021-10-17 MED ORDER — ROSUVASTATIN CALCIUM 10 MG PO TABS: 10.0000 mg | ORAL_TABLET | Freq: Every day | ORAL | 1 refills | Status: DC

## 2021-10-17 NOTE — Progress Notes (Signed)
[place], [place]  [address] Phone:  [phone]   Fax:  [phone]   Established Patient Office Visit  Subjective:  Patient ID: Beverly Johnson, female    DOB: 1975-08-28  Age: 46 y.o. MRN: [id number]  CC:  Chief Complaint  Patient presents with   Follow-up    Pt is here today for her follow up visit and discuss the stomach issues she has been having x 2 weeks. Symptoms- low grade fever last night 100.4, nausea, vomiting, and coughing Pt was seen at the urgent care on 10/13/21 for pain in her right arm and was prescribed tramadol and Robaxin. Pt blood sugar level have been elevated in the 300 range.    HPI Beverly Johnson presents for follow up. She  has a past medical history of DM2 (diabetes mellitus, type 2) ([place]), Gallstones (03/10/2011), HIV infection ([place]), and Immune deficiency disorder ([place]).   Beverly Johnson is in today for diabetes follow up.  She was to follow-up in September however has not been seen.  The prescribed treatment is Glipizide 10 mg twice daily, metformin 1000 mg once daily and Januvia 100 mg daily. Home glucose monitoring indicates a CBG elevated.  Her current A1c is 14.0. She reports that she is having low-grade temp, nausea, vomiting.  She reports that  viral testing has been negative.  She denies anyone else being sick.  She feels like her symptoms are improving..  She has not had any vomiting.  She is also complaining of right arm pain.  She was evaluated in the urgent care and prescribed tramadol and Robaxin which was not effective.  She denies having images.  She has HIV and is currently being followed by infectious disease.     Past Medical History:  Diagnosis Date   DM2 (diabetes mellitus, type 2) ([place])    Gallstones 03/10/2011   HIV infection ([place])    Immune deficiency disorder ([place])     Past Surgical History:  Procedure Laterality Date   CESAREAN SECTION     x3   CHOLECYSTECTOMY  06/29/2011    Procedure: LAPAROSCOPIC CHOLECYSTECTOMY WITH INTRAOPERATIVE CHOLANGIOGRAM;  Surgeon: Merrie Roof, MD;  Location: [facility] OR;  Service: General;  Laterality: N/A;   TUBAL LIGATION      Family History  Problem Relation Age of Onset   Healthy Mother    Healthy Father     Social History   Socioeconomic History   Marital status: Married    Spouse name: Not on file   Number of children: Not on file   Years of education: 10   Highest education level: Not on file  Occupational History    Employer: UNEMPLOYED  Tobacco Use   Smoking status: Never   Smokeless tobacco: Never  Vaping Use   Vaping Use: Never used  Substance and Sexual Activity   Alcohol use: No   Drug use: Not Currently    Types: Marijuana    Comment: CBD   Sexual activity: Yes    Partners: Male    Comment: pt. declined condoms  Other Topics Concern   Not on file  Social History Narrative   Not on file   Social Determinants of Health   Financial Resource Strain: Not on file  Food Insecurity: No Food Insecurity   Worried About Running Out of Food in the Last Year: Never true   Ware in the Last Year: Never true  Transportation  Needs: No Transportation Needs   Lack of Transportation (Medical): No   Lack of Transportation (Non-Medical): No  Physical Activity: Not on file  Stress: Not on file  Social Connections: Not on file  Intimate Partner Violence: Not on file    Outpatient Medications Prior to Visit  Medication Sig Dispense Refill   Accu-Chek Softclix Lancets lancets 1 each 3 (three) times daily.     elvitegravir-cobicistat-emtricitabine-tenofovir (GENVOYA) 150-150-200-10 MG TABS tablet Take 1 tablet by mouth daily with breakfast. 30 tablet 11   glipiZIDE (GLUCOTROL) 10 MG tablet Take 1 tablet (10 mg total) by mouth 2 (two) times daily before a meal. 60 tablet 3   glucose blood (ACCU-CHEK GUIDE) test strip Use as instructed 100 each 12   glucose blood test strip Check your sugar in the morning  before you eat breakfast, and one hour after a meal. 100 each 2   glucose monitoring kit (FREESTYLE) monitoring kit 1 each by Does not apply route daily. Check glucose once in the morning before breakfast and 1 hour after a meal 1 each 0   metFORMIN (GLUCOPHAGE) 500 MG tablet Take 2 tablets (1,000 mg total) by mouth 2 (two) times daily with a meal. (Patient taking differently: Take 500 mg by mouth 2 (two) times daily with a meal.) 120 tablet 11   methocarbamol (ROBAXIN) 500 MG tablet Take 1 tablet (500 mg total) by mouth at bedtime as needed for muscle spasms. 20 tablet 0   sitaGLIPtin (JANUVIA) 100 MG tablet Take 1 tablet (100 mg total) by mouth daily. 30 tablet 11   traMADol (ULTRAM) 50 MG tablet Take 1 tablet (50 mg total) by mouth every 6 (six) hours as needed. (Patient taking differently: Take 50 mg by mouth every 6 (six) hours as needed for moderate pain.) 15 tablet 0   No facility-administered medications prior to visit.    No Known Allergies  ROS Review of Systems    Objective:    Physical Exam Constitutional:      General: She is not in acute distress.    Appearance: She is obese. She is not ill-appearing, toxic-appearing or diaphoretic.  HENT:     Head: Normocephalic and atraumatic.  Cardiovascular:     Rate and Rhythm: Normal rate and regular rhythm.     Pulses: Normal pulses.  Pulmonary:     Effort: Pulmonary effort is normal.     Comments: Diminished breath sounds throughout Abdominal:     Palpations: Abdomen is soft.  Musculoskeletal:     Cervical back: Normal range of motion.  Skin:    General: Skin is warm and dry.     Capillary Refill: Capillary refill takes less than 2 seconds.  Neurological:     General: No focal deficit present.     Mental Status: She is alert and oriented to person, place, and time.  Psychiatric:        Mood and Affect: Mood normal.        Behavior: Behavior normal.        Thought Content: Thought content normal.        Judgment:  Judgment normal.    BP 125/87    Pulse (!) 111    Temp 98.2 F (36.8 C)    Ht _0  (1.702 m)    Wt 247 lb 12.8 oz (112.4 kg)    SpO2 98%    BMI 38.81 kg/m  Wt Readings from Last 3 Encounters:  10/20/21 248 lb (112.5 kg)  10/17/21 247  lb 12.8 oz (112.4 kg)  05/28/21 261 lb (118.4 kg)     Health Maintenance Due  Topic Date Due   FOOT EXAM  12/03/2016   OPHTHALMOLOGY EXAM  12/03/2016   INFLUENZA VACCINE  03/17/2021   URINE MICROALBUMIN  12/12/2021    There are no preventive care reminders to display for this patient.  Lab Results  Component Value Date   TSH 1.105 06/17/2019     Lab Results  Component Value Date   CHOL 148 11/05/2020   Lab Results  Component Value Date   HDL 56 11/05/2020   Lab Results  Component Value Date   LDLCALC 73 11/05/2020   Lab Results  Component Value Date   TRIG 108 11/05/2020   Lab Results  Component Value Date   CHOLHDL 2.6 11/05/2020   Lab Results  Component Value Date   HGBA1C 14.0 (A) 10/17/2021   HGBA1C 14.0 10/17/2021   HGBA1C 14.0 (A) 10/17/2021   HGBA1C 14.0 (A) 10/17/2021      Assessment & Plan:   Problem List Items Addressed This Visit       Other   Human immunodeficiency virus (HIV) disease ([place]) (Chronic)   Other Visit Diagnoses     Uncontrolled type 2 diabetes mellitus with hyperglycemia ([place])    -  Primary Uncontrolled  Lantus 20 unit BID Patient encouraged to bring in glucose readings so treatment can be properly evaluated    Relevant Medications   insulin glargine (LANTUS) 100 UNIT/ML Solostar Pen   rosuvastatin (CRESTOR) 10 MG tablet   Other Relevant Orders   HgB A1c (Completed)   Morbid obesity with BMI of 40.0-44.9, adult ([place])     Obesity with BMI and comorbidities as noted above.  Discussed proper diet (low fat, low sodium, high fiber) with patient.   Discussed need for regular exercise (3 times per week, 20 minutes per session) with patient.    Relevant Medications   insulin glargine  (LANTUS) 100 UNIT/ML Solostar Pen   Radicular pain in right arm       Relevant Orders   DG Shoulder Right   DG Cervical Spine Complete       Meds ordered this encounter  Medications   insulin glargine (LANTUS) 100 UNIT/ML Solostar Pen    Sig: Inject 20 Units into the skin daily.    Dispense:  15 mL    Refill:  11    Order Specific Question:   Supervising Provider    Answer:   Tresa Garter [[id number]]   rosuvastatin (CRESTOR) 10 MG tablet    Sig: Take 1 tablet (10 mg total) by mouth daily.    Dispense:  90 tablet    Refill:  1    Order Specific Question:   Supervising Provider    Answer:   Tresa Garter [id number]    Follow-up: Return in about 6 weeks (around 11/28/2021) for follow up [id number].    Vevelyn Francois, NP

## 2021-10-17 NOTE — Patient Instructions (Addendum)
Diabetes Mellitus and Nutrition, Adult When you have diabetes, or diabetes mellitus, it is very important to have healthy eating habits because your blood sugar (glucose) levels are greatly affected by what you eat and drink. Eating healthy foods in the right amounts, at about the same times every day, can help you: Manage your blood glucose. Lower your risk of heart disease. Improve your blood pressure. Reach or maintain a healthy weight. What can affect my meal plan? Every person with diabetes is different, and each person has different needs for a meal plan. Your health care provider may recommend that you work with a dietitian to make a meal plan that is best for you. Your meal plan may vary depending on factors such as: The calories you need. The medicines you take. Your weight. Your blood glucose, blood pressure, and cholesterol levels. Your activity level. Other health conditions you have, such as heart or kidney disease. How do carbohydrates affect me? Carbohydrates, also called carbs, affect your blood glucose level more than any other type of food. Eating carbs raises the amount of glucose in your blood. It is important to know how many carbs you can safely have in each meal. This is different for every person. Your dietitian can help you calculate how many carbs you should have at each meal and for each snack. How does alcohol affect me? Alcohol can cause a decrease in blood glucose (hypoglycemia), especially if you use insulin or take certain diabetes medicines by mouth. Hypoglycemia can be a life-threatening condition. Symptoms of hypoglycemia, such as sleepiness, dizziness, and confusion, are similar to symptoms of having too much alcohol. Do not drink alcohol if: Your health care provider tells you not to drink. You are pregnant, may be pregnant, or are planning to become pregnant. If you drink alcohol: Limit how much you have to: 0-1 drink a day for women. 0-2 drinks a day  for men. Know how much alcohol is in your drink. In the [place] one drink equals one 12 oz bottle of beer (355 mL), one 5 oz glass of wine (148 mL), or one 1 oz glass of hard liquor (44 mL). Keep yourself hydrated with water, diet soda, or unsweetened iced tea. Keep in mind that regular soda, juice, and other mixers may contain a lot of sugar and must be counted as carbs. What are tips for following this plan? Reading food labels Start by checking the serving size on the Nutrition Facts label of packaged foods and drinks. The number of calories and the amount of carbs, fats, and other nutrients listed on the label are based on one serving of the item. Many items contain more than one serving per package. Check the total grams (g) of carbs in one serving. Check the number of grams of saturated fats and trans fats in one serving. Choose foods that have a low amount or none of these fats. Check the number of milligrams (mg) of salt (sodium) in one serving. Most people should limit total sodium intake to less than 2,300 mg per day. Always check the nutrition information of foods labeled as "low-fat" or "nonfat." These foods may be higher in added sugar or refined carbs and should be avoided. Talk to your dietitian to identify your daily goals for nutrients listed on the label. Shopping Avoid buying canned, pre-made, or processed foods. These foods tend to be high in fat, sodium, and added sugar. Shop around the outside edge of the grocery store. This is where you will  most often find fresh fruits and vegetables, bulk grains, fresh meats, and fresh dairy products. Cooking Use low-heat cooking methods, such as baking, instead of high-heat cooking methods, such as deep frying. Cook using healthy oils, such as olive, canola, or sunflower oil. Avoid cooking with butter, cream, or high-fat meats. Meal planning Eat meals and snacks regularly, preferably at the same times every day. Avoid going long periods of  time without eating. Eat foods that are high in fiber, such as fresh fruits, vegetables, beans, and whole grains. Eat 4-6 oz (112-168 g) of lean protein each day, such as lean meat, chicken, fish, eggs, or tofu. One ounce (oz) (28 g) of lean protein is equal to: 1 oz (28 g) of meat, chicken, or fish. 1 egg.  cup (62 g) of tofu. Eat some foods each day that contain healthy fats, such as avocado, nuts, seeds, and fish. What foods should I eat? Fruits Berries. Apples. Oranges. Peaches. Apricots. Plums. Grapes. Mangoes. Papayas. Pomegranates. Kiwi. Cherries. Vegetables Leafy greens, including lettuce, spinach, kale, chard, collard greens, mustard greens, and cabbage. Beets. Cauliflower. Broccoli. Carrots. Green beans. Tomatoes. Peppers. Onions. Cucumbers. Brussels sprouts. Grains Whole grains, such as whole-wheat or whole-grain bread, crackers, tortillas, cereal, and pasta. Unsweetened oatmeal. Quinoa. Brown or wild rice. Meats and other proteins Seafood. Poultry without skin. Lean cuts of poultry and beef. Tofu. Nuts. Seeds. Dairy Low-fat or fat-free dairy products such as milk, yogurt, and cheese. The items listed above may not be a complete list of foods and beverages you can eat and drink. Contact a dietitian for more information. What foods should I avoid? Fruits Fruits canned with syrup. Vegetables Canned vegetables. Frozen vegetables with butter or cream sauce. Grains Refined white flour and flour products such as bread, pasta, snack foods, and cereals. Avoid all processed foods. Meats and other proteins Fatty cuts of meat. Poultry with skin. Breaded or fried meats. Processed meat. Avoid saturated fats. Dairy Full-fat yogurt, cheese, or milk. Beverages Sweetened drinks, such as soda or iced tea. The items listed above may not be a complete list of foods and beverages you should avoid. Contact a dietitian for more information. Questions to ask a health care provider Do I need to  meet with a certified diabetes care and education specialist? Do I need to meet with a dietitian? What number can I call if I have questions? When are the best times to check my blood glucose? Where to find more information: American Diabetes Association: diabetes.org Academy of Nutrition and Dietetics: eatright.[employer] of Diabetes and Digestive and Kidney Diseases: [employer] Association of Diabetes Care & Education Specialists: diabeteseducator.org Summary It is important to have healthy eating habits because your blood sugar (glucose) levels are greatly affected by what you eat and drink. It is important to use alcohol carefully. A healthy meal plan will help you manage your blood glucose and lower your risk of heart disease. Your health care provider may recommend that you work with a dietitian to make a meal plan that is best for you. This information is not intended to replace advice given to you by your health care provider. Make sure you discuss any questions you have with your health care provider. Document Revised: 03/06/2020 Document Reviewed: 03/06/2020 Elsevier Patient Education  [place]. Shoulder Exercises Ask your health care provider which exercises are safe for you. Do exercises exactly as told by your health care provider and adjust them as directed. It is normal to feel mild stretching, pulling, tightness,  or discomfort as you do these exercises. Stop right away if you feel sudden pain or your pain gets worse. Do not begin these exercises until told by your health care provider. Stretching exercises External rotation and abduction This exercise is sometimes called corner stretch. This exercise rotates your arm outward (external rotation) and moves your arm out from your body (abduction). Stand in a doorway with one of your feet slightly in front of the other. This is called a staggered stance. If you cannot reach your forearms to the door frame,  stand facing a corner of a room. Choose one of the following positions as told by your health care provider: Place your hands and forearms on the door frame above your head. Place your hands and forearms on the door frame at the height of your head. Place your hands on the door frame at the height of your elbows. Slowly move your weight onto your front foot until you feel a stretch across your chest and in the front of your shoulders. Keep your head and chest upright and keep your abdominal muscles tight. Hold for __________ seconds. To release the stretch, shift your weight to your back foot. Repeat __________ times. Complete this exercise __________ times a day. Extension, standing Stand and hold a broomstick, a cane, or a similar object behind your back. Your hands should be a little wider than shoulder width apart. Your palms should face away from your back. Keeping your elbows straight and your shoulder muscles relaxed, move the stick away from your body until you feel a stretch in your shoulders (extension). Avoid shrugging your shoulders while you move the stick. Keep your shoulder blades tucked down toward the middle of your back. Hold for __________ seconds. Slowly return to the starting position. Repeat __________ times. Complete this exercise __________ times a day. Range-of-motion exercises Pendulum  Stand near a wall or a surface that you can hold onto for balance. Bend at the waist and let your left / right arm hang straight down. Use your other arm to support you. Keep your back straight and do not lock your knees. Relax your left / right arm and shoulder muscles, and move your hips and your trunk so your left / right arm swings freely. Your arm should swing because of the motion of your body, not because you are using your arm or shoulder muscles. Keep moving your hips and trunk so your arm swings in the following directions, as told by your health care provider: Side to  side. Forward and backward. In clockwise and counterclockwise circles. Continue each motion for __________ seconds, or for as long as told by your health care provider. Slowly return to the starting position. Repeat __________ times. Complete this exercise __________ times a day. Shoulder flexion, standing  Stand and hold a broomstick, a cane, or a similar object. Place your hands a little more than shoulder width apart on the object. Your left / right hand should be palm up, and your other hand should be palm down. Keep your elbow straight and your shoulder muscles relaxed. Push the stick up with your healthy arm to raise your left / right arm in front of your body, and then over your head until you feel a stretch in your shoulder (flexion). Avoid shrugging your shoulder while you raise your arm. Keep your shoulder blade tucked down toward the middle of your back. Hold for __________ seconds. Slowly return to the starting position. Repeat __________ times. Complete this exercise  __________ times a day. Shoulder abduction, standing Stand and hold a broomstick, a cane, or a similar object. Place your hands a little more than shoulder width apart on the object. Your left / right hand should be palm up, and your other hand should be palm down. Keep your elbow straight and your shoulder muscles relaxed. Push the object across your body toward your left / right side. Raise your left / right arm to the side of your body (abduction) until you feel a stretch in your shoulder. Do not raise your arm above shoulder height unless your health care provider tells you to do that. If directed, raise your arm over your head. Avoid shrugging your shoulder while you raise your arm. Keep your shoulder blade tucked down toward the middle of your back. Hold for __________ seconds. Slowly return to the starting position. Repeat __________ times. Complete this exercise __________ times a day. Internal  rotation  Place your left / right hand behind your back, palm up. Use your other hand to dangle an exercise band, a towel, or a similar object over your shoulder. Grasp the band with your left / right hand so you are holding on to both ends. Gently pull up on the band until you feel a stretch in the front of your left / right shoulder. The movement of your arm toward the center of your body is called internal rotation. Avoid shrugging your shoulder while you raise your arm. Keep your shoulder blade tucked down toward the middle of your back. Hold for __________ seconds. Release the stretch by letting go of the band and lowering your hands. Repeat __________ times. Complete this exercise __________ times a day. Strengthening exercises External rotation  Sit in a stable chair without armrests. Secure an exercise band to a stable object at elbow height on your left / right side. Place a soft object, such as a folded towel or a small pillow, between your left / right upper arm and your body to move your elbow about 4 inches (10 cm) away from your side. Hold the end of the exercise band so it is tight and there is no slack. Keeping your elbow pressed against the soft object, slowly move your forearm out, away from your abdomen (external rotation). Keep your body steady so only your forearm moves. Hold for __________ seconds. Slowly return to the starting position. Repeat __________ times. Complete this exercise __________ times a day. Shoulder abduction  Sit in a stable chair without armrests, or stand up. Hold a __________ weight in your left / right hand, or hold an exercise band with both hands. Start with your arms straight down and your left / right palm facing in, toward your body. Slowly lift your left / right hand out to your side (abduction). Do not lift your hand above shoulder height unless your health care provider tells you that this is safe. Keep your arms straight. Avoid  shrugging your shoulder while you do this movement. Keep your shoulder blade tucked down toward the middle of your back. Hold for __________ seconds. Slowly lower your arm, and return to the starting position. Repeat __________ times. Complete this exercise __________ times a day. Shoulder extension Sit in a stable chair without armrests, or stand up. Secure an exercise band to a stable object in front of you so it is at shoulder height. Hold one end of the exercise band in each hand. Your palms should face each other. Straighten your elbows and lift your  hands up to shoulder height. Step back, away from the secured end of the exercise band, until the band is tight and there is no slack. Squeeze your shoulder blades together as you pull your hands down to the sides of your thighs (extension). Stop when your hands are straight down by your sides. Do not let your hands go behind your body. Hold for __________ seconds. Slowly return to the starting position. Repeat __________ times. Complete this exercise __________ times a day. Shoulder row Sit in a stable chair without armrests, or stand up. Secure an exercise band to a stable object in front of you so it is at waist height. Hold one end of the exercise band in each hand. Position your palms so that your thumbs are facing the ceiling (neutral position). Bend each of your elbows to a 90-degree angle (right angle) and keep your upper arms at your sides. Step back until the band is tight and there is no slack. Slowly pull your elbows back behind you. Hold for __________ seconds. Slowly return to the starting position. Repeat __________ times. Complete this exercise __________ times a day. Shoulder press-ups  Sit in a stable chair that has armrests. Sit upright, with your feet flat on the floor. Put your hands on the armrests so your elbows are bent and your fingers are pointing forward. Your hands should be about even with the sides of your  body. Push down on the armrests and use your arms to lift yourself off the chair. Straighten your elbows and lift yourself up as much as you comfortably can. Move your shoulder blades down, and avoid letting your shoulders move up toward your ears. Keep your feet on the ground. As you get stronger, your feet should support less of your body weight as you lift yourself up. Hold for __________ seconds. Slowly lower yourself back into the chair. Repeat __________ times. Complete this exercise __________ times a day. Wall push-ups  Stand so you are facing a stable wall. Your feet should be about one arm-length away from the wall. Lean forward and place your palms on the wall at shoulder height. Keep your feet flat on the floor as you bend your elbows and lean forward toward the wall. Hold for __________ seconds. Straighten your elbows to push yourself back to the starting position. Repeat __________ times. Complete this exercise __________ times a day. This information is not intended to replace advice given to you by your health care provider. Make sure you discuss any questions you have with your health care provider. Document Revised: 11/25/2018 Document Reviewed: 09/02/2018 Elsevier Patient Education  [place].

## 2021-10-20 ENCOUNTER — Inpatient Hospital Stay (HOSPITAL_COMMUNITY)
Admission: EM | Admit: 2021-10-20 | Discharge: 2021-10-23 | DRG: 690 | Disposition: A | Discharge status: Home / Self Care | Payer: Medicaid Other | Attending: Internal Medicine | Admitting: Internal Medicine

## 2021-10-20 ENCOUNTER — Other Ambulatory Visit: Payer: Self-pay

## 2021-10-20 ENCOUNTER — Encounter (HOSPITAL_COMMUNITY): Payer: Self-pay | Admitting: Internal Medicine

## 2021-10-20 ENCOUNTER — Emergency Department (HOSPITAL_COMMUNITY): Payer: Medicaid Other

## 2021-10-20 DIAGNOSIS — Z7984 Long term (current) use of oral hypoglycemic drugs: Secondary | ICD-10-CM

## 2021-10-20 DIAGNOSIS — E785 Hyperlipidemia, unspecified: Secondary | ICD-10-CM | POA: Diagnosis present

## 2021-10-20 DIAGNOSIS — Z6838 Body mass index (BMI) 38.0-38.9, adult: Secondary | ICD-10-CM

## 2021-10-20 DIAGNOSIS — R739 Hyperglycemia, unspecified: Secondary | ICD-10-CM | POA: Diagnosis not present

## 2021-10-20 DIAGNOSIS — E871 Hypo-osmolality and hyponatremia: Secondary | ICD-10-CM | POA: Diagnosis present

## 2021-10-20 DIAGNOSIS — R509 Fever, unspecified: Secondary | ICD-10-CM | POA: Diagnosis not present

## 2021-10-20 DIAGNOSIS — R059 Cough, unspecified: Secondary | ICD-10-CM | POA: Diagnosis not present

## 2021-10-20 DIAGNOSIS — E119 Type 2 diabetes mellitus without complications: Secondary | ICD-10-CM | POA: Diagnosis not present

## 2021-10-20 DIAGNOSIS — Z794 Long term (current) use of insulin: Secondary | ICD-10-CM

## 2021-10-20 DIAGNOSIS — R531 Weakness: Secondary | ICD-10-CM | POA: Diagnosis not present

## 2021-10-20 DIAGNOSIS — A419 Sepsis, unspecified organism: Secondary | ICD-10-CM | POA: Diagnosis present

## 2021-10-20 DIAGNOSIS — E1165 Type 2 diabetes mellitus with hyperglycemia: Secondary | ICD-10-CM | POA: Diagnosis present

## 2021-10-20 DIAGNOSIS — R9431 Abnormal electrocardiogram [ECG] [EKG]: Secondary | ICD-10-CM | POA: Diagnosis not present

## 2021-10-20 DIAGNOSIS — B20 Human immunodeficiency virus [HIV] disease: Secondary | ICD-10-CM | POA: Diagnosis present

## 2021-10-20 DIAGNOSIS — Z20822 Contact with and (suspected) exposure to covid-19: Secondary | ICD-10-CM | POA: Diagnosis present

## 2021-10-20 DIAGNOSIS — B9620 Unspecified Escherichia coli [E. coli] as the cause of diseases classified elsewhere: Secondary | ICD-10-CM | POA: Diagnosis present

## 2021-10-20 DIAGNOSIS — R5081 Fever presenting with conditions classified elsewhere: Secondary | ICD-10-CM | POA: Diagnosis not present

## 2021-10-20 DIAGNOSIS — Z79899 Other long term (current) drug therapy: Secondary | ICD-10-CM

## 2021-10-20 DIAGNOSIS — Z9049 Acquired absence of other specified parts of digestive tract: Secondary | ICD-10-CM

## 2021-10-20 DIAGNOSIS — N10 Acute tubulo-interstitial nephritis: Principal | ICD-10-CM | POA: Diagnosis present

## 2021-10-20 DIAGNOSIS — E6609 Other obesity due to excess calories: Secondary | ICD-10-CM

## 2021-10-20 DIAGNOSIS — G43B1 Ophthalmoplegic migraine, intractable: Secondary | ICD-10-CM

## 2021-10-20 DIAGNOSIS — D509 Iron deficiency anemia, unspecified: Secondary | ICD-10-CM | POA: Diagnosis present

## 2021-10-20 DIAGNOSIS — E876 Hypokalemia: Secondary | ICD-10-CM | POA: Diagnosis present

## 2021-10-20 DIAGNOSIS — G43109 Migraine with aura, not intractable, without status migrainosus: Secondary | ICD-10-CM | POA: Diagnosis present

## 2021-10-20 DIAGNOSIS — E860 Dehydration: Secondary | ICD-10-CM | POA: Diagnosis present

## 2021-10-20 DIAGNOSIS — E669 Obesity, unspecified: Secondary | ICD-10-CM

## 2021-10-20 DIAGNOSIS — E872 Acidosis, unspecified: Secondary | ICD-10-CM | POA: Diagnosis present

## 2021-10-20 LAB — COMPREHENSIVE METABOLIC PANEL
ALT: 14 U/L (ref 0–44)
AST: 27 U/L (ref 15–41)
Albumin: 2.7 g/dL — ABNORMAL LOW (ref 3.5–5.0)
Alkaline Phosphatase: 99 U/L (ref 38–126)
Anion gap: 13 (ref 5–15)
BUN: 9 mg/dL (ref 6–20)
CO2: 19 mmol/L — ABNORMAL LOW (ref 22–32)
Calcium: 8.8 mg/dL — ABNORMAL LOW (ref 8.9–10.3)
Chloride: 96 mmol/L — ABNORMAL LOW (ref 98–111)
Creatinine, Ser: 0.98 mg/dL (ref 0.44–1.00)
GFR, Estimated: >60 mL/min (ref >60)
Glucose, Bld: 410 mg/dL — ABNORMAL HIGH (ref 70–99)
Potassium: 4.1 mmol/L (ref 3.5–5.1)
Sodium: 128 mmol/L — ABNORMAL LOW (ref 135–145)
Total Bilirubin: 0.8 mg/dL (ref 0.3–1.2)
Total Protein: 8.3 g/dL — ABNORMAL HIGH (ref 6.5–8.1)

## 2021-10-20 LAB — URINALYSIS, ROUTINE W REFLEX MICROSCOPIC
Bilirubin Urine: NEGATIVE
Glucose, UA: >=500 mg/dL — AB
Ketones, ur: 80 mg/dL — AB
Nitrite: NEGATIVE
Protein, ur: 30 mg/dL — AB
Specific Gravity, Urine: 1.03 (ref 1.005–1.030)
WBC, UA: >50 WBC/hpf — ABNORMAL HIGH (ref 0–5)
pH: 5 (ref 5.0–8.0)

## 2021-10-20 LAB — CBC WITH DIFFERENTIAL/PLATELET
Abs Immature Granulocytes: 0.09 10*3/uL — ABNORMAL HIGH (ref 0.00–0.07)
Basophils Absolute: 0 10*3/uL (ref 0.0–0.1)
Basophils Relative: 0 %
Eosinophils Absolute: 0 10*3/uL (ref 0.0–0.5)
Eosinophils Relative: 0 %
HCT: 35.9 % — ABNORMAL LOW (ref 36.0–46.0)
Hemoglobin: 11.1 g/dL — ABNORMAL LOW (ref 12.0–15.0)
Immature Granulocytes: 1 %
Lymphocytes Relative: 23 %
Lymphs Abs: 1.6 10*3/uL (ref 0.7–4.0)
MCH: 22.7 pg — ABNORMAL LOW (ref 26.0–34.0)
MCHC: 30.9 g/dL (ref 30.0–36.0)
MCV: 73.6 fL — ABNORMAL LOW (ref 80.0–100.0)
Monocytes Absolute: 0.7 10*3/uL (ref 0.1–1.0)
Monocytes Relative: 10 %
Neutro Abs: 4.4 10*3/uL (ref 1.7–7.7)
Neutrophils Relative %: 66 %
Platelets: 201 10*3/uL (ref 150–400)
RBC: 4.88 MIL/uL (ref 3.87–5.11)
RDW: 14.5 % (ref 11.5–15.5)
WBC: 6.7 10*3/uL (ref 4.0–10.5)
nRBC: 0 % (ref 0.0–0.2)

## 2021-10-20 LAB — I-STAT VENOUS BLOOD GAS, ED
Acid-base deficit: 3 mmol/L — ABNORMAL HIGH (ref 0.0–2.0)
Bicarbonate: 20 mmol/L (ref 20.0–28.0)
Calcium, Ion: 1.12 mmol/L — ABNORMAL LOW (ref 1.15–1.40)
HCT: 38 % (ref 36.0–46.0)
Hemoglobin: 12.9 g/dL (ref 12.0–15.0)
O2 Saturation: 89 %
Potassium: 4.2 mmol/L (ref 3.5–5.1)
Sodium: 132 mmol/L — ABNORMAL LOW (ref 135–145)
TCO2: 21 mmol/L — ABNORMAL LOW (ref 22–32)
pCO2, Ven: 29.3 mmHg — ABNORMAL LOW (ref 44–60)
pH, Ven: 7.443 — ABNORMAL HIGH (ref 7.25–7.43)
pO2, Ven: 53 mmHg — ABNORMAL HIGH (ref 32–45)

## 2021-10-20 LAB — RESP PANEL BY RT-PCR (FLU A&B, COVID) ARPGX2
Influenza A by PCR: NEGATIVE
Influenza B by PCR: NEGATIVE
SARS Coronavirus 2 by RT PCR: NEGATIVE

## 2021-10-20 LAB — CBG MONITORING, ED
Glucose-Capillary: 383 mg/dL — ABNORMAL HIGH (ref 70–99)
Glucose-Capillary: 336 mg/dL — ABNORMAL HIGH (ref 70–99)
Glucose-Capillary: 293 mg/dL — ABNORMAL HIGH (ref 70–99)
Glucose-Capillary: 199 mg/dL — ABNORMAL HIGH (ref 70–99)
Glucose-Capillary: 283 mg/dL — ABNORMAL HIGH (ref 70–99)

## 2021-10-20 LAB — LACTIC ACID, PLASMA
Lactic Acid, Venous: 2 mmol/L (ref 0.5–1.9)
Lactic Acid, Venous: 1 mmol/L (ref 0.5–1.9)

## 2021-10-20 LAB — I-STAT BETA HCG BLOOD, ED (MC, WL, AP ONLY): I-stat hCG, quantitative: <5 m[IU]/mL (ref <5)

## 2021-10-20 MED ORDER — ONDANSETRON HCL 4 MG PO TABS
4.0000 mg | ORAL_TABLET | Freq: Four times a day (QID) | ORAL | Status: DC | PRN
Start: 2021-10-20 — End: 2021-10-23

## 2021-10-20 MED ORDER — HYDRALAZINE HCL 20 MG/ML IJ SOLN: 5.0000 mg | INTRAMUSCULAR | Status: DC | PRN

## 2021-10-20 MED ORDER — LACTATED RINGERS IV SOLN: INTRAVENOUS | Status: DC

## 2021-10-20 MED ORDER — INSULIN ASPART 100 UNIT/ML IJ SOLN
8.0000 [IU] | Freq: Once | INTRAMUSCULAR | Status: AC
  Administered 2021-10-20: 8 [IU] via INTRAVENOUS

## 2021-10-20 MED ORDER — LACTATED RINGERS IV BOLUS
1000.0000 mL | Freq: Once | INTRAVENOUS | Status: AC
  Administered 2021-10-20: 1000 mL via INTRAVENOUS

## 2021-10-20 MED ORDER — ELVITEG-COBIC-EMTRICIT-TENOFAF 150-150-200-10 MG PO TABS
1.0000 | ORAL_TABLET | Freq: Every day | ORAL | Status: DC
  Administered 2021-10-21 – 2021-10-23 (×3): 1 via ORAL
  Filled 2021-10-20 (×3): qty 1

## 2021-10-20 MED ORDER — INSULIN ASPART 100 UNIT/ML IJ SOLN
0.0000 [IU] | Freq: Every day | INTRAMUSCULAR | Status: DC
  Administered 2021-10-20 – 2021-10-21 (×2): 3 [IU] via SUBCUTANEOUS
  Administered 2021-10-22: 4 [IU] via SUBCUTANEOUS

## 2021-10-20 MED ORDER — METHOCARBAMOL 500 MG PO TABS
500.0000 mg | ORAL_TABLET | Freq: Every evening | ORAL | Status: DC | PRN
  Administered 2021-10-21 – 2021-10-22 (×2): 500 mg via ORAL
  Filled 2021-10-20 (×2): qty 1

## 2021-10-20 MED ORDER — INSULIN GLARGINE-YFGN 100 UNIT/ML ~~LOC~~ SOLN
20.0000 [IU] | Freq: Every day | SUBCUTANEOUS | Status: DC
  Administered 2021-10-20 – 2021-10-21 (×2): 20 [IU] via SUBCUTANEOUS
  Filled 2021-10-20 (×3): qty 0.2

## 2021-10-20 MED ORDER — INSULIN ASPART 100 UNIT/ML IJ SOLN
0.0000 [IU] | Freq: Three times a day (TID) | INTRAMUSCULAR | Status: DC
  Administered 2021-10-20: 3 [IU] via SUBCUTANEOUS
  Administered 2021-10-21: 8 [IU] via SUBCUTANEOUS
  Administered 2021-10-21: 11 [IU] via SUBCUTANEOUS
  Administered 2021-10-21: 3 [IU] via SUBCUTANEOUS
  Administered 2021-10-22: 8 [IU] via SUBCUTANEOUS
  Administered 2021-10-22: 5 [IU] via SUBCUTANEOUS
  Administered 2021-10-22: 3 [IU] via SUBCUTANEOUS
  Administered 2021-10-23: 09:00:00 5 [IU] via SUBCUTANEOUS

## 2021-10-20 MED ORDER — PROCHLORPERAZINE EDISYLATE 10 MG/2ML IJ SOLN
5.0000 mg | Freq: Once | INTRAMUSCULAR | Status: AC
  Administered 2021-10-20: 5 mg via INTRAVENOUS
  Filled 2021-10-20: qty 2

## 2021-10-20 MED ORDER — TRAMADOL HCL 50 MG PO TABS
50.0000 mg | ORAL_TABLET | Freq: Four times a day (QID) | ORAL | Status: DC | PRN
  Administered 2021-10-21 (×2): 50 mg via ORAL
  Filled 2021-10-20 (×2): qty 1

## 2021-10-20 MED ORDER — ACETAMINOPHEN 325 MG PO TABS
650.0000 mg | ORAL_TABLET | Freq: Once | ORAL | Status: AC | PRN
  Administered 2021-10-20: 650 mg via ORAL
  Filled 2021-10-20: qty 2

## 2021-10-20 MED ORDER — INSULIN GLARGINE 100 UNIT/ML SOLOSTAR PEN: 20.0000 [IU] | PEN_INJECTOR | Freq: Every day | SUBCUTANEOUS | Status: DC

## 2021-10-20 MED ORDER — POLYETHYLENE GLYCOL 3350 17 G PO PACK: 17.0000 g | PACK | Freq: Every day | ORAL | Status: DC | PRN

## 2021-10-20 MED ORDER — ACETAMINOPHEN 325 MG PO TABS
650.0000 mg | ORAL_TABLET | Freq: Four times a day (QID) | ORAL | Status: DC | PRN
  Administered 2021-10-20 – 2021-10-21 (×3): 650 mg via ORAL
  Filled 2021-10-20 (×3): qty 2

## 2021-10-20 MED ORDER — ENOXAPARIN SODIUM 40 MG/0.4ML IJ SOSY
40.0000 mg | PREFILLED_SYRINGE | INTRAMUSCULAR | Status: DC
  Administered 2021-10-20 – 2021-10-22 (×3): 40 mg via SUBCUTANEOUS
  Filled 2021-10-20 (×3): qty 0.4

## 2021-10-20 MED ORDER — ONDANSETRON HCL 4 MG/2ML IJ SOLN
INTRAMUSCULAR | Status: AC
  Administered 2021-10-20: 4 mg
  Filled 2021-10-20: qty 2

## 2021-10-20 MED ORDER — DOCUSATE SODIUM 100 MG PO CAPS
100.0000 mg | ORAL_CAPSULE | Freq: Two times a day (BID) | ORAL | Status: DC
  Administered 2021-10-20 – 2021-10-21 (×3): 100 mg via ORAL
  Filled 2021-10-20 (×7): qty 1

## 2021-10-20 MED ORDER — ACETAMINOPHEN 650 MG RE SUPP: 650.0000 mg | Freq: Four times a day (QID) | RECTAL | Status: DC | PRN

## 2021-10-20 MED ORDER — BISACODYL 5 MG PO TBEC: 5.0000 mg | DELAYED_RELEASE_TABLET | Freq: Every day | ORAL | Status: DC | PRN

## 2021-10-20 MED ORDER — ONDANSETRON HCL 4 MG/2ML IJ SOLN: 4.0000 mg | Freq: Four times a day (QID) | INTRAMUSCULAR | Status: DC | PRN

## 2021-10-20 MED ORDER — SODIUM CHLORIDE 0.9% FLUSH
3.0000 mL | Freq: Two times a day (BID) | INTRAVENOUS | Status: DC
  Administered 2021-10-22 – 2021-10-23 (×3): 3 mL via INTRAVENOUS

## 2021-10-20 NOTE — Assessment & Plan Note (Addendum)
-  Marked hyperglycemia but without acidosis, not in DKA ?-Recent A1c was 14 indicating poor control ?-She was planned to start insulin but has not yet started it ?-Will start glargine at this time ?-Will also cover with moderate-scale SSI ?-Hold metformin, glipizide, sitagliptin ?

## 2021-10-20 NOTE — Progress Notes (Signed)
Inpatient Diabetes Program Recommendations ? ?AACE/ADA: New Consensus Statement on Inpatient Glycemic Control (2015) ? ?Target Ranges:  Prepandial:   less than 140 mg/dL ?     Peak postprandial:   less than 180 mg/dL (1-2 hours) ?     Critically ill patients:  140 - 180 mg/dL  ? ?Lab Results  ?Component Value Date  ? GLUCAP 293 (H) 10/20/2021  ? HGBA1C 14.0 (A) 10/17/2021  ? HGBA1C 14.0 10/17/2021  ? HGBA1C 14.0 (A) 10/17/2021  ? HGBA1C 14.0 (A) 10/17/2021  ? ? ?Review of Glycemic Control ? ?Diabetes history: DM 2 ?Outpatient Diabetes medications: Glipizide 10 mg bid, Metformin 500 mg bid, Januvia 100 mg Daily, Lantus 20 units Daily ?Current orders for Inpatient glycemic control:  ?In ED ? ?A1c 14% on 3/3 ? ?Spoke with pt at bedside regarding A1c level and glucose control at home. Pt reports having glucose trends in the 200-300 range on oral medications alone. Pt reports her PCP placed her on insulin due to her high A1c she just sis not have the opportunity to pick it up. I told pt to pick up the prescription on her way home from the hospital on discharge. Reviewed glucose and A1c goals. Pt repeated the glucose goals back to me. Pt reports having all needed glucose supplies at home.  ? ?Thanks, ? ?Tama Headings RN, MSN, BC-ADM ?Inpatient Diabetes Coordinator ?Team Pager [phone] ([phone]) ? ? ? ? ? ?

## 2021-10-20 NOTE — ED Provider Notes (Signed)
[facility] EMERGENCY DEPARTMENT Provider Note   CSN: [id number] Arrival date & time: 10/20/21  0825     History  Chief Complaint  Patient presents with   Generalized Body Aches    hyperg   Hyperglycemia    c   Cough    Beverly Johnson is a 46 y.o. female.   Hyperglycemia Cough Patient presents with weakness and cough.  Has had for the last few weeks.  History of diabetes.  Now is had nausea and vomiting.  Sugar upon arrival above 400.  Apparently insulin is been ordered but not on it yet.  Decreased oral intake.  Feels bad all over.  Temperature of 103 upon arrival also.  History of HIV but CD4 count of 644 months ago.   Past Medical History:  Diagnosis Date   DM2 (diabetes mellitus, type 2) ([place])    Gallstones 03/10/2011   HIV infection ([place])    Immune deficiency disorder ([place])     Home Medications Prior to Admission medications   Medication Sig Start Date End Date Taking? Authorizing Provider  elvitegravir-cobicistat-emtricitabine-tenofovir (GENVOYA) 150-150-200-10 MG TABS tablet Take 1 tablet by mouth daily with breakfast. 05/28/21  Yes Comer, Okey Regal, MD  glipiZIDE (GLUCOTROL) 10 MG tablet Take 1 tablet (10 mg total) by mouth 2 (two) times daily before a meal. 03/19/21  Yes Vevelyn Francois, NP  metFORMIN (GLUCOPHAGE) 500 MG tablet Take 2 tablets (1,000 mg total) by mouth 2 (two) times daily with a meal. Patient taking differently: Take 500 mg by mouth 2 (two) times daily with a meal. 03/19/21 05/16/22 Yes King, Diona Foley, NP  methocarbamol (ROBAXIN) 500 MG tablet Take 1 tablet (500 mg total) by mouth at bedtime as needed for muscle spasms. 10/13/21  Yes Lamptey, Myrene Galas, MD  sitaGLIPtin (JANUVIA) 100 MG tablet Take 1 tablet (100 mg total) by mouth daily. 03/19/21  Yes Vevelyn Francois, NP  traMADol (ULTRAM) 50 MG tablet Take 1 tablet (50 mg total) by mouth every 6 (six) hours as needed. Patient taking differently: Take 50 mg by mouth every 6 (six) hours as  needed for moderate pain. 10/13/21  Yes Lamptey, Myrene Galas, MD  Accu-Chek Softclix Lancets lancets 1 each 3 (three) times daily. 02/06/21   [provider]  glucose blood (ACCU-CHEK GUIDE) test strip Use as instructed 03/19/21   Vevelyn Francois, NP  glucose blood test strip Check your sugar in the morning before you eat breakfast, and one hour after a meal. 05/26/19   Melynda Ripple, MD  glucose monitoring kit (FREESTYLE) monitoring kit 1 each by Does not apply route daily. Check glucose once in the morning before breakfast and 1 hour after a meal 02/05/21   Vevelyn Francois, NP  insulin glargine (LANTUS) 100 UNIT/ML Solostar Pen Inject 20 Units into the skin daily. 10/17/21   Vevelyn Francois, NP  rosuvastatin (CRESTOR) 10 MG tablet Take 1 tablet (10 mg total) by mouth daily. 10/17/21   Vevelyn Francois, NP  cetirizine (ZYRTEC ALLERGY) 10 MG tablet Take 1 tablet (10 mg total) by mouth daily. Patient not taking: No sig reported 11/26/19 11/08/20  Henderly, Britni A, PA-C      Allergies    Patient has no known allergies.    Review of Systems   Review of Systems  Respiratory:  Positive for cough.    Physical Exam Updated Vital Signs BP 119/78    Pulse 99    Temp 100.2 F (37.9 C) (  Oral)    Resp 18    Ht 5' 7"  (1.702 m)    Wt 112.5 kg    SpO2 98%    BMI 38.84 kg/m  Physical Exam Vitals and nursing note reviewed.  Constitutional:      Comments: Patient appears somewhat uncomfortable.  Eyes:     Pupils: Pupils are equal, round, and reactive to light.  Cardiovascular:     Rate and Rhythm: Tachycardia present.  Pulmonary:     Breath sounds: No wheezing or rhonchi.  Abdominal:     Tenderness: There is no abdominal tenderness.  Musculoskeletal:        General: No tenderness.  Skin:    General: Skin is warm.     Capillary Refill: Capillary refill takes less than 2 seconds.  Neurological:     Mental Status: She is alert.    ED Results / Procedures / Treatments   Labs (all labs ordered  are listed, but only abnormal results are displayed) Labs Reviewed  LACTIC ACID, PLASMA - Abnormal; Notable for the following components:      Result Value   Lactic Acid, Venous 2.0 (*)    All other components within normal limits  COMPREHENSIVE METABOLIC PANEL - Abnormal; Notable for the following components:   Sodium 128 (*)    Chloride 96 (*)    CO2 19 (*)    Glucose, Bld 410 (*)    Calcium 8.8 (*)    Total Protein 8.3 (*)    Albumin 2.7 (*)    All other components within normal limits  CBC WITH DIFFERENTIAL/PLATELET - Abnormal; Notable for the following components:   Hemoglobin 11.1 (*)    HCT 35.9 (*)    MCV 73.6 (*)    MCH 22.7 (*)    Abs Immature Granulocytes 0.09 (*)    All other components within normal limits  URINALYSIS, ROUTINE W REFLEX MICROSCOPIC - Abnormal; Notable for the following components:   APPearance CLOUDY (*)    Glucose, UA >=500 (*)    Hgb urine dipstick SMALL (*)    Ketones, ur 80 (*)    Protein, ur 30 (*)    Leukocytes,Ua LARGE (*)    WBC, UA >50 (*)    Bacteria, UA FEW (*)    All other components within normal limits  I-STAT VENOUS BLOOD GAS, ED - Abnormal; Notable for the following components:   pH, Ven 7.443 (*)    pCO2, Ven 29.3 (*)    pO2, Ven 53 (*)    TCO2 21 (*)    Acid-base deficit 3.0 (*)    Sodium 132 (*)    Calcium, Ion 1.12 (*)    All other components within normal limits  CBG MONITORING, ED - Abnormal; Notable for the following components:   Glucose-Capillary 383 (*)    All other components within normal limits  CBG MONITORING, ED - Abnormal; Notable for the following components:   Glucose-Capillary 336 (*)    All other components within normal limits  CBG MONITORING, ED - Abnormal; Notable for the following components:   Glucose-Capillary 293 (*)    All other components within normal limits  RESP PANEL BY RT-PCR (FLU A&B, COVID) ARPGX2  CULTURE, BLOOD (ROUTINE X 2)  CULTURE, BLOOD (ROUTINE X 2)  LACTIC ACID, PLASMA   I-STAT BETA HCG BLOOD, ED (MC, [facility], AP ONLY)    EKG None  Radiology DG Chest 1 View  Result Date: 10/20/2021 CLINICAL DATA:  Weakness, cough EXAM: CHEST  1 VIEW  COMPARISON:  01/22/2021 FINDINGS: Lung volumes are low. There is no focal consolidation or edema. No pleural effusion or pneumothorax. Normal heart size. IMPRESSION: No acute process in the chest. Electronically Signed   By: Macy Mis M.D.   On: 10/20/2021 09:15    Procedures Procedures    Medications Ordered in ED Medications  insulin aspart (novoLOG) injection 8 Units (has no administration in time range)  acetaminophen (TYLENOL) tablet 650 mg (650 mg Oral Given 10/20/21 0856)  ondansetron (ZOFRAN) 4 MG/2ML injection (4 mg  Given 10/20/21 0930)  lactated ringers bolus 1,000 mL (1,000 mLs Intravenous New Bag/Given 10/20/21 0930)  lactated ringers bolus 1,000 mL (1,000 mLs Intravenous New Bag/Given 10/20/21 1102)  prochlorperazine (COMPAZINE) injection 5 mg (5 mg Intravenous Given 10/20/21 1102)    ED Course/ Medical Decision Making/ A&P                           Medical Decision Making Amount and/or Complexity of Data Reviewed External Data Reviewed: notes.    Details: Recent PCP note. Labs: ordered. Decision-making details documented in ED Course. Radiology: ordered and independent interpretation performed. Decision-making details documented in ED Course. Discussion of management or test interpretation with external provider(s): Hospitalist  Risk OTC drugs. Prescription drug management. Decision regarding hospitalization.  Critical Care Total time providing critical care: 30-74 minutes  Patient with history of HIV and diabetes.  Comes on with fever.  Has had weakness and achiness and a cough for weeks now.  Febrile upon arrival.  Chest x-ray reassuring.  Urine does not show infection but does show ketones.  Venous pH is not acidotic but does have a mildly decreased bicarb.  Urine has greater than 80 ketones.  Still  really not tolerating orals.  Still feels failed after 2 L of fluid.  Sugars come down to about 300 from close to 500.  With mild improvement but still feeling bad I do not think she can tolerate orals enough to manage this at home.  Initial lactic acid only 2.0.  Believe she required mission to the hospital.  IV bolus of insulin given and will discuss with hospitalist about further insulin dosing.  Does not appear to have a severe sepsis at this time.  Do not see an area to treat with empiric antibiotics.  Chest x-ray independently interpreted by me.  No pneumonia.        Final Clinical Impression(s) / ED Diagnoses Final diagnoses:  Fever, unspecified fever cause  Hyperglycemia    Rx / DC Orders ED Discharge Orders     None         Davonna Belling, MD 10/20/21 1224

## 2021-10-20 NOTE — Assessment & Plan Note (Addendum)
-  SIRS criteria in this patient includes: Leukocytosis, tachycardia, tachypnea  ?-Patient has evidence of acute organ failure with elevated lactate >2 that is not easily explained by another condition. ?-While awaiting blood cultures, this may be a preseptic condition. ?-Sepsis protocol initiated ?-Suspected source is unknown, but since she has potential immunocompromise ongoing monitoring is reasonable ?-Viral syndrome is also a consideration ?-Blood and urine cultures pending ?-Will place in observation status with telemetry and continue to monitor ?-She has not been given antibiotics since there is no obvious source and her symptoms appear to have resolved; will continue to monitor without treatment ?-Lactic acidosis resolved ?

## 2021-10-20 NOTE — H&P (Signed)
History and Physical    Patient: Beverly Johnson IOX:[id number] DOB: [birth year] DOA: 10/20/2021 DOS: the patient was seen and examined on 10/20/2021 PCP: Vevelyn Francois, NP  Patient coming from: Home - lives with husband; NOK: Husband, [phone]   Chief Complaint:  Myalgias  HPI: Beverly Johnson is a 46 y.o. female with medical history significant of DM and HIV presenting with hyperglycemia. She hasn't been feeling well for up to a month.  +cough, body aches, vomiting.  She has been dizzy with walking.  Sugars have been up and down - highest in the 300s.  +fever for a few days.  No sick contacts.    ER Course:  Fever, URI symptoms for 3 weeks.  T103.  Diabetic on orals, likely needs to start insulin.  ?UTI - culture pending, no symptoms.  Can't eat, nauseated.  Glucose improved with IVF.     Review of Systems: As mentioned in the history of present illness. All other systems reviewed and are negative. Past Medical History:  Diagnosis Date   DM2 (diabetes mellitus, type 2) ([place])    HIV infection ([place])    Past Surgical History:  Procedure Laterality Date   CESAREAN SECTION     x3   CHOLECYSTECTOMY  06/29/2011   Procedure: LAPAROSCOPIC CHOLECYSTECTOMY WITH INTRAOPERATIVE CHOLANGIOGRAM;  Surgeon: Merrie Roof, MD;  Location: [place];  Service: General;  Laterality: N/A;   TUBAL LIGATION     Social History:  reports that she has never smoked. She has never used smokeless tobacco. She reports that she does not currently use drugs after having used the following drugs: Marijuana. She reports that she does not drink alcohol.  No Known Allergies  Family History  Problem Relation Age of Onset   Healthy Mother    Healthy Father     Prior to Admission medications   Medication Sig Start Date End Date Taking? Authorizing Provider  elvitegravir-cobicistat-emtricitabine-tenofovir (GENVOYA) 150-150-200-10 MG TABS tablet Take 1 tablet by mouth daily with breakfast. 05/28/21  Yes Comer,  Okey Regal, MD  glipiZIDE (GLUCOTROL) 10 MG tablet Take 1 tablet (10 mg total) by mouth 2 (two) times daily before a meal. 03/19/21  Yes Vevelyn Francois, NP  metFORMIN (GLUCOPHAGE) 500 MG tablet Take 2 tablets (1,000 mg total) by mouth 2 (two) times daily with a meal. Patient taking differently: Take 500 mg by mouth 2 (two) times daily with a meal. 03/19/21 05/16/22 Yes King, Diona Foley, NP  methocarbamol (ROBAXIN) 500 MG tablet Take 1 tablet (500 mg total) by mouth at bedtime as needed for muscle spasms. 10/13/21  Yes Lamptey, Myrene Galas, MD  sitaGLIPtin (JANUVIA) 100 MG tablet Take 1 tablet (100 mg total) by mouth daily. 03/19/21  Yes Vevelyn Francois, NP  traMADol (ULTRAM) 50 MG tablet Take 1 tablet (50 mg total) by mouth every 6 (six) hours as needed. Patient taking differently: Take 50 mg by mouth every 6 (six) hours as needed for moderate pain. 10/13/21  Yes Lamptey, Myrene Galas, MD  Accu-Chek Softclix Lancets lancets 1 each 3 (three) times daily. 02/06/21   [provider]  glucose blood (ACCU-CHEK GUIDE) test strip Use as instructed 03/19/21   Vevelyn Francois, NP  glucose blood test strip Check your sugar in the morning before you eat breakfast, and one hour after a meal. 05/26/19   Melynda Ripple, MD  glucose monitoring kit (FREESTYLE) monitoring kit 1 each by Does not apply route daily. Check glucose once in the morning  before breakfast and 1 hour after a meal 02/05/21   Vevelyn Francois, NP  insulin glargine (LANTUS) 100 UNIT/ML Solostar Pen Inject 20 Units into the skin daily. 10/17/21   Vevelyn Francois, NP  rosuvastatin (CRESTOR) 10 MG tablet Take 1 tablet (10 mg total) by mouth daily. 10/17/21   Vevelyn Francois, NP  cetirizine (ZYRTEC ALLERGY) 10 MG tablet Take 1 tablet (10 mg total) by mouth daily. Patient not taking: No sig reported 11/26/19 11/08/20  Henderly, Britni A, PA-C    Physical Exam: Vitals:   10/20/21 1356 10/20/21 1400 10/20/21 1415 10/20/21 1727  BP: 111/74 109/73 111/81 117/82   Pulse: 97 (!) 103 97 94  Resp: 19 (!) 23 (!) 21 19  Temp:      TempSrc:      SpO2: 100% 97% 99% 99%  Weight:      Height:       General:  Appears calm and comfortable and is in NAD, somnolent and seemingly disinterested Eyes:  PERRL, EOMI, normal lids, iris ENT:  grossly normal hearing, lips & tongue, mmm Neck:  no LAD, masses or thyromegaly Cardiovascular:  RR with mild tachycardia, no m/r/g. No LE edema.  Respiratory:   CTA bilaterally with no wheezes/rales/rhonchi.  Normal respiratory effort. Abdomen:  soft, NT, ND Skin:  no rash or induration seen on limited exam Musculoskeletal:  grossly normal tone BUE/BLE, good ROM, no bony abnormality Psychiatric:  somnolent/disinterested mood and affect, speech fluent and appropriate, AOx3 Neurologic:  CN 2-12 grossly intact, moves all extremities in coordinated fashion   Radiological Exams on Admission: Independently reviewed - see discussion in A/P where applicable  DG Chest 1 View  Result Date: 10/20/2021 CLINICAL DATA:  Weakness, cough EXAM: CHEST  1 VIEW COMPARISON:  01/22/2021 FINDINGS: Lung volumes are low. There is no focal consolidation or edema. No pleural effusion or pneumothorax. Normal heart size. IMPRESSION: No acute process in the chest. Electronically Signed   By: Macy Mis M.D.   On: 10/20/2021 09:15    EKG: Independently reviewed.  Sinus tachycardia with rate 128; nonspecific ST changes with no evidence of acute ischemia   Labs on Admission: I have personally reviewed the available labs and imaging studies at the time of the admission.  Pertinent labs:    VBG: 7.443/29.3/20 Na++ 128 CO2 19 Glucose 410, 336, 293, 199 Albumin 2.7 Lactate 2.0, 1.0 WBC 6.7 Hgb 11.1 HCG <5 COVID/flu negative UA: >500 glucose, small Hgb, 80 ketones, large LE, few bacteria, >50 WBC    Assessment and Plan: * Fever -SIRS criteria in this patient includes: Leukocytosis, tachycardia, tachypnea  -Patient has evidence of acute  organ failure with elevated lactate >2 that is not easily explained by another condition. -While awaiting blood cultures, this may be a preseptic condition. -Sepsis protocol initiated -Suspected source is unknown, but since she has potential immunocompromise ongoing monitoring is reasonable -Viral syndrome is also a consideration -Blood and urine cultures pending -Will place in observation status with telemetry and continue to monitor -She has not been given antibiotics since there is no obvious source and her symptoms appear to have resolved; will continue to monitor without treatment -Lactic acidosis resolved  Human immunodeficiency virus (HIV) disease ([place]) -She still has a detectable HIV viral load but her CD4 count is appropriate -She may be at mildly increased risk for infection but not for opportunistic infection -Continue Genvoya  Type 2 diabetes mellitus without complications ([facility]) -Marked hyperglycemia but without acidosis, not in DKA -Recent  A1c was 14 indicating poor control -She was planned to start insulin but has not yet started it -Will start glargine at this time -Will also cover with moderate-scale SSI -Hold metformin, glipizide, sitagliptin  Class 2 obesity due to excess calories with body mass index (BMI) of 38.0 to 38.9 in adult -Body mass index is 38.84 kg/m..  -Weight loss should be encouraged -Outpatient PCP/bariatric medicine/bariatric surgery f/u encouraged       Advance Care Planning:   Code Status: Full Code   Consults: Diabetes coordinator; TOC team  DVT Prophylaxis: Lovenox  Family Communication: None present; she is capable of communicating with family at this time  Severity of Illness: The appropriate patient status for this patient is OBSERVATION. Observation status is judged to be reasonable and necessary in order to provide the required intensity of service to ensure the patient's safety. The patient's presenting symptoms, physical exam  findings, and initial radiographic and laboratory data in the context of their medical condition is felt to place them at decreased risk for further clinical deterioration. Furthermore, it is anticipated that the patient will be medically stable for discharge from the hospital within 2 midnights of admission.   Author: Karmen Bongo, MD 10/20/2021 6:57 PM  For on call review [email or web address].

## 2021-10-20 NOTE — Assessment & Plan Note (Signed)
-  Body mass index is 38.84 kg/m?Marland Kitchen.  ?-Weight loss should be encouraged ?-Outpatient PCP/bariatric medicine/bariatric surgery f/u encouraged ?

## 2021-10-20 NOTE — ED Triage Notes (Signed)
Pt BIB [facility] for weakness, achiness, cough x weeks. Was recently told to take insulin, has not picked it up per EMS. Pt c/o hyperglycemia, recent glucose 300+.  ? ?CBG 463. EMS did not take adequate VS to report to RN. ?

## 2021-10-20 NOTE — Assessment & Plan Note (Signed)
-  She still has a detectable HIV viral load but her CD4 count is appropriate ?-She may be at mildly increased risk for infection but not for opportunistic infection ?-Continue Genvoya ?

## 2021-10-21 ENCOUNTER — Inpatient Hospital Stay (HOSPITAL_COMMUNITY): Payer: Medicaid Other

## 2021-10-21 DIAGNOSIS — D259 Leiomyoma of uterus, unspecified: Secondary | ICD-10-CM | POA: Diagnosis not present

## 2021-10-21 DIAGNOSIS — E860 Dehydration: Secondary | ICD-10-CM | POA: Diagnosis not present

## 2021-10-21 DIAGNOSIS — D61818 Other pancytopenia: Secondary | ICD-10-CM | POA: Diagnosis not present

## 2021-10-21 DIAGNOSIS — A419 Sepsis, unspecified organism: Secondary | ICD-10-CM | POA: Diagnosis present

## 2021-10-21 DIAGNOSIS — B20 Human immunodeficiency virus [HIV] disease: Secondary | ICD-10-CM | POA: Diagnosis not present

## 2021-10-21 DIAGNOSIS — Z6838 Body mass index (BMI) 38.0-38.9, adult: Secondary | ICD-10-CM

## 2021-10-21 DIAGNOSIS — E119 Type 2 diabetes mellitus without complications: Secondary | ICD-10-CM | POA: Diagnosis not present

## 2021-10-21 DIAGNOSIS — R059 Cough, unspecified: Secondary | ICD-10-CM | POA: Diagnosis not present

## 2021-10-21 DIAGNOSIS — G43109 Migraine with aura, not intractable, without status migrainosus: Secondary | ICD-10-CM | POA: Diagnosis not present

## 2021-10-21 DIAGNOSIS — Z7984 Long term (current) use of oral hypoglycemic drugs: Secondary | ICD-10-CM | POA: Diagnosis not present

## 2021-10-21 DIAGNOSIS — R519 Headache, unspecified: Secondary | ICD-10-CM | POA: Diagnosis not present

## 2021-10-21 DIAGNOSIS — E871 Hypo-osmolality and hyponatremia: Secondary | ICD-10-CM | POA: Diagnosis not present

## 2021-10-21 DIAGNOSIS — R5081 Fever presenting with conditions classified elsewhere: Secondary | ICD-10-CM

## 2021-10-21 DIAGNOSIS — E1165 Type 2 diabetes mellitus with hyperglycemia: Secondary | ICD-10-CM | POA: Diagnosis not present

## 2021-10-21 DIAGNOSIS — E872 Acidosis, unspecified: Secondary | ICD-10-CM | POA: Diagnosis not present

## 2021-10-21 DIAGNOSIS — E785 Hyperlipidemia, unspecified: Secondary | ICD-10-CM | POA: Diagnosis not present

## 2021-10-21 DIAGNOSIS — J9811 Atelectasis: Secondary | ICD-10-CM | POA: Diagnosis not present

## 2021-10-21 DIAGNOSIS — D509 Iron deficiency anemia, unspecified: Secondary | ICD-10-CM | POA: Diagnosis not present

## 2021-10-21 DIAGNOSIS — Z794 Long term (current) use of insulin: Secondary | ICD-10-CM | POA: Diagnosis not present

## 2021-10-21 DIAGNOSIS — G8929 Other chronic pain: Secondary | ICD-10-CM | POA: Diagnosis not present

## 2021-10-21 DIAGNOSIS — R509 Fever, unspecified: Secondary | ICD-10-CM | POA: Diagnosis not present

## 2021-10-21 DIAGNOSIS — E876 Hypokalemia: Secondary | ICD-10-CM | POA: Diagnosis not present

## 2021-10-21 DIAGNOSIS — R1110 Vomiting, unspecified: Secondary | ICD-10-CM | POA: Diagnosis not present

## 2021-10-21 DIAGNOSIS — N10 Acute tubulo-interstitial nephritis: Secondary | ICD-10-CM | POA: Diagnosis not present

## 2021-10-21 DIAGNOSIS — R531 Weakness: Secondary | ICD-10-CM | POA: Diagnosis not present

## 2021-10-21 DIAGNOSIS — B9620 Unspecified Escherichia coli [E. coli] as the cause of diseases classified elsewhere: Secondary | ICD-10-CM | POA: Diagnosis not present

## 2021-10-21 DIAGNOSIS — Z79899 Other long term (current) drug therapy: Secondary | ICD-10-CM | POA: Diagnosis not present

## 2021-10-21 DIAGNOSIS — Z9049 Acquired absence of other specified parts of digestive tract: Secondary | ICD-10-CM | POA: Diagnosis not present

## 2021-10-21 DIAGNOSIS — Z20822 Contact with and (suspected) exposure to covid-19: Secondary | ICD-10-CM | POA: Diagnosis not present

## 2021-10-21 LAB — BASIC METABOLIC PANEL
Anion gap: 9 (ref 5–15)
BUN: 8 mg/dL (ref 6–20)
CO2: 23 mmol/L (ref 22–32)
Calcium: 8.2 mg/dL — ABNORMAL LOW (ref 8.9–10.3)
Chloride: 98 mmol/L (ref 98–111)
Creatinine, Ser: 0.73 mg/dL (ref 0.44–1.00)
GFR, Estimated: >60 mL/min (ref >60)
Glucose, Bld: 210 mg/dL — ABNORMAL HIGH (ref 70–99)
Potassium: 3.8 mmol/L (ref 3.5–5.1)
Sodium: 130 mmol/L — ABNORMAL LOW (ref 135–145)

## 2021-10-21 LAB — RAPID URINE DRUG SCREEN, HOSP PERFORMED
Amphetamines: NOT DETECTED
Barbiturates: NOT DETECTED
Benzodiazepines: NOT DETECTED
Cocaine: NOT DETECTED
Opiates: NOT DETECTED
Tetrahydrocannabinol: NOT DETECTED

## 2021-10-21 LAB — CBC
HCT: 29.8 % — ABNORMAL LOW (ref 36.0–46.0)
Hemoglobin: 9.1 g/dL — ABNORMAL LOW (ref 12.0–15.0)
MCH: 22.2 pg — ABNORMAL LOW (ref 26.0–34.0)
MCHC: 30.5 g/dL (ref 30.0–36.0)
MCV: 72.9 fL — ABNORMAL LOW (ref 80.0–100.0)
Platelets: 150 10*3/uL (ref 150–400)
RBC: 4.09 MIL/uL (ref 3.87–5.11)
RDW: 14.4 % (ref 11.5–15.5)
WBC: 4.6 10*3/uL (ref 4.0–10.5)
nRBC: 0 % (ref 0.0–0.2)

## 2021-10-21 LAB — GLUCOSE, CAPILLARY
Glucose-Capillary: 308 mg/dL — ABNORMAL HIGH (ref 70–99)
Glucose-Capillary: 276 mg/dL — ABNORMAL HIGH (ref 70–99)
Glucose-Capillary: 275 mg/dL — ABNORMAL HIGH (ref 70–99)

## 2021-10-21 LAB — PROCALCITONIN: Procalcitonin: 1.54 ng/mL

## 2021-10-21 LAB — T-HELPER CELLS (CD4) COUNT (NOT AT ARMC)
CD4 % Helper T Cell: 36 % (ref 33–65)
CD4 T Cell Abs: 283 /uL — ABNORMAL LOW (ref 400–1790)

## 2021-10-21 LAB — CBG MONITORING, ED: Glucose-Capillary: 193 mg/dL — ABNORMAL HIGH (ref 70–99)

## 2021-10-21 MED ORDER — BENZONATATE 100 MG PO CAPS
200.0000 mg | ORAL_CAPSULE | Freq: Three times a day (TID) | ORAL | Status: DC | PRN
  Administered 2021-10-21: 200 mg via ORAL
  Filled 2021-10-21: qty 2

## 2021-10-21 MED ORDER — INSULIN GLARGINE-YFGN 100 UNIT/ML ~~LOC~~ SOLN
30.0000 [IU] | Freq: Every day | SUBCUTANEOUS | Status: DC
  Administered 2021-10-22: 30 [IU] via SUBCUTANEOUS
  Filled 2021-10-21 (×2): qty 0.3

## 2021-10-21 MED ORDER — INSULIN ASPART 100 UNIT/ML IJ SOLN
4.0000 [IU] | Freq: Three times a day (TID) | INTRAMUSCULAR | Status: DC
  Administered 2021-10-21 – 2021-10-22 (×3): 4 [IU] via SUBCUTANEOUS

## 2021-10-21 MED ORDER — IOHEXOL 300 MG/ML  SOLN
80.0000 mL | Freq: Once | INTRAMUSCULAR | Status: AC | PRN
  Administered 2021-10-21: 80 mL via INTRAVENOUS

## 2021-10-21 MED ORDER — GUAIFENESIN-DM 100-10 MG/5ML PO SYRP
5.0000 mL | ORAL_SOLUTION | ORAL | Status: DC | PRN
  Administered 2021-10-23: 01:00:00 5 mL via ORAL
  Filled 2021-10-21: qty 5

## 2021-10-21 MED ORDER — SODIUM CHLORIDE 0.9 % IV SOLN
2.0000 g | Freq: Every day | INTRAVENOUS | Status: DC
  Administered 2021-10-21: 2 g via INTRAVENOUS
  Filled 2021-10-21: qty 20

## 2021-10-21 NOTE — TOC Initial Note (Signed)
Transition of Care (TOC) - Initial/Assessment Note  ? ? ?Patient Details  ?Name: Beverly Johnson ?MRN: [id number] ?Date of Birth: 30-Apr-1976 ? ?Transition of Care (TOC) CM/SW Contact:    ?Marilu Favre, RN ?Phone Number: ?10/21/2021, 12:41 PM ? ?Clinical Narrative:                 ? ? ?  ?  ?Transition of Care (TOC) Screening Note ? ? ?Patient Details  ?Name: Beverly Johnson ?Date of Birth: 03/26/1976 ? ? ?Transition of Care Department [facility]) has reviewed patient and no TOC needs have been identified at this time. We will continue to monitor patient advancement through interdisciplinary progression rounds. If new patient transition needs arise, please place a TOC consult. ?  ? ? ?Patient Goals and CMS Choice ?  ?  ?  ? ?Expected Discharge Plan and Services ?  ?  ?  ?  ?  ?                ?  ?  ?  ?  ?  ?  ?  ?  ?  ?  ? ?Prior Living Arrangements/Services ?  ?  ?  ?       ?  ?  ?  ?  ? ?Activities of Daily Living ?  ?  ? ?Permission Sought/Granted ?  ?  ?   ?   ?   ?   ? ?Emotional Assessment ?  ?  ?  ?  ?  ?  ? ?Admission diagnosis:  Cough [R05.9] ?Hyperglycemia [R73.9] ?Sepsis ([place]) [A41.9] ?Sepsis due to undetermined organism ([place]) [A41.9] ?Fever, unspecified fever cause [R50.9] ?Patient Active Problem List  ? Diagnosis Date Noted  ? Sepsis ([place]) 10/21/2021  ? Fever 10/20/2021  ? Need for prophylactic vaccination and inoculation against influenza 05/28/2021  ? Adenomyosis 02/24/2021  ? Vulvovaginal candidiasis 01/16/2021  ? Dysmenorrhea 01/16/2021  ? Callus of foot 11/26/2020  ? Heavy menses 11/26/2020  ? History of 2019 novel coronavirus disease (COVID-19) 06/16/2019  ? Abnormal ECG 06/16/2019  ? Medication monitoring encounter 11/12/2017  ? Breast nodule 09/02/2017  ? Screening examination for venereal disease 03/30/2017  ? Encounter for long-term (current) use of high-risk medication 03/30/2017  ? Microalbuminuria due to type 2 diabetes mellitus ([place]) 03/30/2017  ? Knee pain, chronic 03/30/2017  ? Vitamin D  deficiency 03/21/2017  ? Healthcare maintenance 03/02/2014  ? Class 2 obesity due to excess calories with body mass index (BMI) of 38.0 to 38.9 in adult 04/27/2012  ? Type 2 diabetes mellitus without complications ([place]) 78/24/2353  ? Human immunodeficiency virus (HIV) disease ([place]) 09/29/2006  ? ?PCP:  Vevelyn Francois, NP ?Pharmacy:   ?[facility] [place] [address] ?[address] ?[address] ?[place] [address] ?Phone: [phone] Fax: [phone] ? ? ? ? ?Social Determinants of Health (SDOH) Interventions ?  ? ?Readmission Risk Interventions ?No flowsheet data found. ? ? ?

## 2021-10-21 NOTE — ED Notes (Signed)
Patient encouraged to keep BP cuff on, patient stated "No, you can get a reading,but take it off when you're done." ?

## 2021-10-21 NOTE — Consult Note (Signed)
?  [facility]  ? ? ? ? ? ?Reason for Consult: fever    ?Referring Physician: Dr. Sloan Leiter ? ?Principal Problem: ?  Fever ?Active Problems: ?  Human immunodeficiency virus (HIV) disease ([place]) ?  Type 2 diabetes mellitus without complications ([place]) ?  Class 2 obesity due to excess calories with body mass index (BMI) of 38.0 to 38.9 in adult ?  Sepsis ([place]) ? ? ? docusate sodium  100 mg Oral BID  ? elvitegravir-cobicistat-emtricitabine-tenofovir  1 tablet Oral Q breakfast  ? enoxaparin (LOVENOX) injection  40 mg Subcutaneous Q24H  ? insulin aspart  0-15 Units Subcutaneous TID WC  ? insulin aspart  0-5 Units Subcutaneous QHS  ? insulin glargine-yfgn  20 Units Subcutaneous Daily  ? sodium chloride flush  3 mL Intravenous Q12H  ? ? ?Recommendations: ?Continue ceftriaxone  ? ?Assessment: ?She has been feeling poorly for about 1 month with a recent fever.  No obvious symptoms including no dysuria, no headache recently or vision changes, no shortness of breath or anything specific.  CT scan noted and may have pyelonephritis though I would anticipate more symptoms.  Regardless, on ceftriaxone and will continue.   ? ?Antibiotics: ?ceftriaxone ? ?HPI: Beverly Johnson is a 46 y.o. female with well-controlled HIV, poorly-controlled diabetes who came in with fever.  She has felt poorly with n/v/d since a stay at a [facility] last month for a nieces eye surgery.  She felt she ate something bad and has been sick since.  She developed a fever 2-3 days ago and febrile here up to 103.3.  No leukocytosis.  Has been able to take her ARVs but poor po intake otherwise.  Hgb A1c 14 at her recent clinic visit.  Lantus 20 units daily was added at her most recent visit.   ? ? ?Review of Systems: ? Constitutional: positive for fevers, chills, malaise, and anorexia ?Gastrointestinal: positive for nausea, vomiting, and diarrhea ?All other systems reviewed and are negative   ? ?Past Medical History:   ?Diagnosis Date  ? DM2 (diabetes mellitus, type 2) ([place])   ? HIV infection ([place])   ? ? ?Social History  ? ?Tobacco Use  ? Smoking status: Never  ? Smokeless tobacco: Never  ?Vaping Use  ? Vaping Use: Never used  ?Substance Use Topics  ? Alcohol use: No  ? Drug use: Not Currently  ?  Types: Marijuana  ?  Comment: CBD  ? ? ?Family History  ?Problem Relation Age of Onset  ? Healthy Mother   ? Healthy Father   ? ? ?No Known Allergies ? ?Physical Exam: ?Constitutional: in no apparent distress  ?Vitals:  ? 10/21/21 1051 10/21/21 1153  ?BP: 115/80 129/73  ?Pulse: (!) 109 (!) 104  ?Resp: 16 18  ?Temp:  99.5 ?F (37.5 ?C)  ?SpO2: 99% 100%  ? ?EYES: anicteric ?ENMT: no thrush ?Cardiovascular: Cor RRR ?Respiratory: CTA B; normal respiratory effort ?GI: soft, + diffuse mild tenderness with no guarding or rebound ?Musculoskeletal: no edema ?Skin: no rash ? ?Lab Results  ?Component Value Date  ? WBC 4.6 10/21/2021  ? HGB 9.1 (L) 10/21/2021  ? HCT 29.8 (L) 10/21/2021  ? MCV 72.9 (L) 10/21/2021  ? PLT 150 10/21/2021  ?  ?Lab Results  ?Component Value Date  ? CREATININE 0.73 10/21/2021  ? BUN 8 10/21/2021  ? NA 130 (L) 10/21/2021  ? K 3.8 10/21/2021  ? CL 98 10/21/2021  ? CO2 23 10/21/2021  ?  ?  Lab Results  ?Component Value Date  ? ALT 14 10/20/2021  ? AST 27 10/20/2021  ? ALKPHOS 99 10/20/2021  ?  ? ?Microbiology: ?Recent Results (from the past 240 hour(s))  ?Resp Panel by RT-PCR (Flu A&B, Covid) Nasopharyngeal Swab     Status: None  ? Collection Time: 10/20/21  8:55 AM  ? Specimen: Nasopharyngeal Swab; Nasopharyngeal(NP) swabs in vial transport medium  ?Result Value Ref Range Status  ? SARS Coronavirus 2 by RT PCR NEGATIVE NEGATIVE Final  ?  Comment: (NOTE) ?SARS-CoV-2 target nucleic acids are NOT DETECTED. ? ?The SARS-CoV-2 RNA is generally detectable in upper respiratory ?specimens during the acute phase of infection. The lowest ?concentration of SARS-CoV-2 viral copies this assay can detect is ?138 copies/mL. A negative result  does not preclude SARS-Cov-2 ?infection and should not be used as the sole basis for treatment or ?other patient management decisions. A negative result may occur with  ?improper specimen collection/handling, submission of specimen other ?than nasopharyngeal swab, presence of viral mutation(s) within the ?areas targeted by this assay, and inadequate number of viral ?copies(<138 copies/mL). A negative result must be combined with ?clinical observations, patient history, and epidemiological ?information. The expected result is Negative. ? ?Fact Sheet for Patients:  ?EntrepreneurPulse.com.au ? ?Fact Sheet for Healthcare Providers:  ?IncredibleEmployment.be ? ?This test is no t yet approved or cleared by the [place] FDA and  ?has been authorized for detection and/or diagnosis of SARS-CoV-2 by ?FDA under an Emergency Use Authorization (EUA). This EUA will remain  ?in effect (meaning this test can be used) for the duration of the ?COVID-19 declaration under Section 564(b)(1) of the Act, 21 ?U.S.C.section 360bbb-3(b)(1), unless the authorization is terminated  ?or revoked sooner.  ? ? ?  ? Influenza A by PCR NEGATIVE NEGATIVE Final  ? Influenza B by PCR NEGATIVE NEGATIVE Final  ?  Comment: (NOTE) ?The Xpert Xpress SARS-CoV-2/FLU/RSV plus assay is intended as an aid ?in the diagnosis of influenza from Nasopharyngeal swab specimens and ?should not be used as a sole basis for treatment. Nasal washings and ?aspirates are unacceptable for Xpert Xpress SARS-CoV-2/FLU/RSV ?testing. ? ?Fact Sheet for Patients: ?EntrepreneurPulse.com.au ? ?Fact Sheet for Healthcare Providers: ?IncredibleEmployment.be ? ?This test is not yet approved or cleared by the [place] FDA and ?has been authorized for detection and/or diagnosis of SARS-CoV-2 by ?FDA under an Emergency Use Authorization (EUA). This EUA will remain ?in effect (meaning this test can be used) for  the duration of the ?COVID-19 declaration under Section 564(b)(1) of the Act, 21 U.S.C. ?section 360bbb-3(b)(1), unless the authorization is terminated or ?revoked. ? ?Performed at [facility], [place] [address] [place], [place] ?[address] ?  ?Urine Culture     Status: Abnormal (Preliminary result)  ? Collection Time: 10/20/21 11:00 AM  ? Specimen: Urine, Clean Catch  ?Result Value Ref Range Status  ? Specimen Description URINE, CLEAN CATCH  Final  ? Special Requests NONE  Final  ? Culture (A)  Final  ?  >=100,000 COLONIES/mL ESCHERICHIA COLI ?SUSCEPTIBILITIES TO FOLLOW ?Performed at [facility], [place] [address] [place], [place] [address] ?  ? Report Status PENDING  Incomplete  ?Culture, blood (routine x 2)     Status: None (Preliminary result)  ? Collection Time: 10/20/21  2:10 PM  ? Specimen: BLOOD  ?Result Value Ref Range Status  ? Specimen Description BLOOD LEFT ANTECUBITAL  Final  ? Special Requests   Final  ?  BOTTLES DRAWN AEROBIC AND ANAEROBIC Blood  Culture results may not be optimal due to an inadequate volume of blood received in culture bottles  ? Culture   Final  ?  NO GROWTH < 24 HOURS ?Performed at [facility], [place] [address] [place], [place] [address] ?  ? Report Status PENDING  Incomplete  ?Culture, blood (routine x 2)     Status: None (Preliminary result)  ? Collection Time: 10/20/21  2:10 PM  ? Specimen: BLOOD LEFT HAND  ?Result Value Ref Range Status  ? Specimen Description BLOOD LEFT HAND  Final  ? Special Requests   Final  ?  BOTTLES DRAWN AEROBIC AND ANAEROBIC Blood Culture results may not be optimal due to an inadequate volume of blood received in culture bottles  ? Culture   Final  ?  NO GROWTH < 24 HOURS ?Performed at [facility], [place] [address], [place], [place] [address] ?  ? Report Status PENDING  Incomplete  ? ? ?Thayer Headings, MD ?[facility]ious [facility] ?[place] ?[email or web address] ?10/21/2021, 1:38 PM  ?

## 2021-10-21 NOTE — ED Notes (Signed)
Breakfast Order placed ?

## 2021-10-21 NOTE — Progress Notes (Addendum)
PROGRESS NOTE        PATIENT DETAILS Name: Beverly Johnson Age: 46 y.o. Sex: female Date of Birth: 01-23-1976 Admit Date: 10/20/2021 Admitting Physician Karmen Bongo, MD ZOX:WRUE, Diona Foley, NP  Brief Summary: Patient is a 46 y.o.  female with history of insulin-dependent DM-2, HIV-presenting with 44-monthhistory of intermittent fever, dry cough, vomiting and upper abdominal pain.  She was subsequently admitted to the hospitalist service for further evaluation and treatment.  See below for further details.   Significant Hospital events: [facility]/vomiting/upper abdominal pain/dry cough  Significant imaging studies: 3/6>> CXR: No PNA  Significant microbiology data: 3/6>> COVID/influenza PCR: Negative 3/6>> blood culture: Pending 3/6>> urine culture: Pending  Procedures: None  Consults:  Infectious disease  Subjective: Feels nauseous-feels weak/fatigued.  Multiple episodes of fever overnight.  Objective: Vitals: Blood pressure 101/70, pulse (!) 105, temperature 99.2 F (37.3 C), temperature source Oral, resp. rate 16, height '5\' 7"'$  (1.702 m), weight 112.5 kg, SpO2 100 %.   Exam: Gen Exam:Alert awake-not in any distress HEENT:atraumatic, normocephalic Chest: B/L clear to auscultation anteriorly CVS:S1S2 regular Abdomen: Soft-mild tenderness in the epigastric rea without any peritoneal signs. Extremities:no edema Neurology: Non focal Skin: no rash  Pertinent Labs/Radiology: CBC Latest Ref Rng & Units 10/21/2021 10/20/2021 10/20/2021  WBC 4.0 - 10.5 K/uL 4.6 - 6.7  Hemoglobin 12.0 - 15.0 g/dL 9.1(L) 12.9 11.1(L)  Hematocrit 36.0 - 46.0 % 29.8(L) 38.0 35.9(L)  Platelets 150 - 400 K/uL 150 - 201    Lab Results  Component Value Date   NA 130 (L) 10/21/2021   K 3.8 10/21/2021   CL 98 10/21/2021   CO2 23 10/21/2021      Assessment/Plan: Fever: History of HIV-compliant to  retrovirals-unclear etiology of fever-given vomiting/intermittent abdominal pain/dry cough-check CT chest/abdomen.  Await cultures-given relative stability-avoiding initiation of empiric antibiotics at this point.  Have consulted ID.  Suspect will need inpatient work-up-given 2-1-monthstory of fever-unexplained cause and immunocompromise status.  Addendum: Urine cultures positive for E. coli-CT imaging shows possible pyelonephritis.  Symptoms have been ongoing for at least 2 months-she does not have any dysuria-has some back pain but no obvious flank pain.  Unclear whether she actually has UTI-however we will go ahead and start her on IV Rocephin and await ID eval-follow clinical course.  HIV: Last CD4 count 674 October 2022-continue antiretrovirals.  Obtaining updated CD4 count.  Uncontrolled DM-2 with hyperglycemia (A1c 14.0 on 3/3): Has had significant issues with hyperglycemia over the past several weeks-for now continue with 20 units of Semglee and SSI.  Follow and adjust insulin regimen over the next few days.  Continue to hold all oral hypoglycemic agents.   Recent Labs    10/20/21 1641 10/20/21 2235 10/21/21 0747  GLUCAP 199* 283* 193*    Hyponatremia: Probably due to mild dehydration in setting of persistent fever.  Slowly improving-continue supportive care.   Microcytic anemia: Reported history of fibroids-no GI loss history-check anemia panel with a.m. labs.  HLD: Resume statins over the next few days.  Morbid obesity: Estimated body mass index is 38.84 kg/m as calculated from the following:   Height as of this encounter: '5\' 7"'$  (1.702 m).   Weight as of this encounter: 112.5 kg.   Code status:   Code Status: Full Code   DVT  Prophylaxis: enoxaparin (LOVENOX) injection 40 mg Start: 10/20/21 1400    Family Communication: Spouse at bedside   Disposition Plan: Status is: Observation The patient will require care spanning > 2 midnights and should be moved to inpatient  because: Fever of unknown etiology-immunocompromised (HIV)-needs inpatient work-up-see above.   Planned Discharge Destination:Home   Diet: Diet Order             Diet Carb Modified Fluid consistency: Thin; Room service appropriate? Yes  Diet effective now                     Antimicrobial agents: Anti-infectives (From admission, onward)    Start     Dose/Rate Route Frequency Ordered Stop   10/21/21 0800  elvitegravir-cobicistat-emtricitabine-tenofovir (GENVOYA) 150-150-200-10 MG tablet 1 tablet        1 tablet Oral Daily with breakfast 10/20/21 1358          MEDICATIONS: Scheduled Meds:  docusate sodium  100 mg Oral BID   elvitegravir-cobicistat-emtricitabine-tenofovir  1 tablet Oral Q breakfast   enoxaparin (LOVENOX) injection  40 mg Subcutaneous Q24H   insulin aspart  0-15 Units Subcutaneous TID WC   insulin aspart  0-5 Units Subcutaneous QHS   insulin glargine-yfgn  20 Units Subcutaneous Daily   sodium chloride flush  3 mL Intravenous Q12H   Continuous Infusions:  lactated ringers 75 mL/hr at 10/21/21 0638   PRN Meds:.acetaminophen **OR** acetaminophen, bisacodyl, hydrALAZINE, methocarbamol, ondansetron **OR** ondansetron (ZOFRAN) IV, polyethylene glycol, traMADol   I have personally reviewed following labs and imaging studies  LABORATORY DATA: CBC: Recent Labs  Lab 10/20/21 0855 10/20/21 0915 10/21/21 0640  WBC 6.7  --  4.6  NEUTROABS 4.4  --   --   HGB 11.1* 12.9 9.1*  HCT 35.9* 38.0 29.8*  MCV 73.6*  --  72.9*  PLT 201  --  742    Basic Metabolic Panel: Recent Labs  Lab 10/20/21 0855 10/20/21 0915 10/21/21 0640  NA 128* 132* 130*  K 4.1 4.2 3.8  CL 96*  --  98  CO2 19*  --  23  GLUCOSE 410*  --  210*  BUN 9  --  8  CREATININE 0.98  --  0.73  CALCIUM 8.8*  --  8.2*    GFR: Estimated Creatinine Clearance: 117.4 mL/min (by C-G formula based on SCr of 0.73 mg/dL).  Liver Function Tests: Recent Labs  Lab 10/20/21 0855  AST 27   ALT 14  ALKPHOS 99  BILITOT 0.8  PROT 8.3*  ALBUMIN 2.7*   No results for input(s): LIPASE, AMYLASE in the last 168 hours. No results for input(s): AMMONIA in the last 168 hours.  Coagulation Profile: No results for input(s): INR, PROTIME in the last 168 hours.  Cardiac Enzymes: No results for input(s): CKTOTAL, CKMB, CKMBINDEX, TROPONINI in the last 168 hours.  BNP (last 3 results) No results for input(s): PROBNP in the last 8760 hours.  Lipid Profile: No results for input(s): CHOL, HDL, LDLCALC, TRIG, CHOLHDL, LDLDIRECT in the last 72 hours.  Thyroid Function Tests: No results for input(s): TSH, T4TOTAL, FREET4, T3FREE, THYROIDAB in the last 72 hours.  Anemia Panel: No results for input(s): VITAMINB12, FOLATE, FERRITIN, TIBC, IRON, RETICCTPCT in the last 72 hours.  Urine analysis:    Component Value Date/Time   COLORURINE YELLOW 10/20/2021 1100   APPEARANCEUR CLOUDY (A) 10/20/2021 1100   LABSPEC 1.030 10/20/2021 1100   PHURINE 5.0 10/20/2021 1100   GLUCOSEU >=500 (A) 10/20/2021 1100  GLUCOSEU NEG mg/dL 09/17/2006 2003   HGBUR SMALL (A) 10/20/2021 1100   BILIRUBINUR NEGATIVE 10/20/2021 1100   KETONESUR 80 (A) 10/20/2021 1100   PROTEINUR 30 (A) 10/20/2021 1100   UROBILINOGEN 0.2 08/16/2021 1209   NITRITE NEGATIVE 10/20/2021 1100   LEUKOCYTESUR LARGE (A) 10/20/2021 1100    Sepsis Labs: Lactic Acid, Venous    Component Value Date/Time   LATICACIDVEN 1.0 10/20/2021 1410    MICROBIOLOGY: Recent Results (from the past 240 hour(s))  Resp Panel by RT-PCR (Flu A&B, Covid) Nasopharyngeal Swab     Status: None   Collection Time: 10/20/21  8:55 AM   Specimen: Nasopharyngeal Swab; Nasopharyngeal(NP) swabs in vial transport medium  Result Value Ref Range Status   SARS Coronavirus 2 by RT PCR NEGATIVE NEGATIVE Final    Comment: (NOTE) SARS-CoV-2 target nucleic acids are NOT DETECTED.  The SARS-CoV-2 RNA is generally detectable in upper respiratory specimens during  the acute phase of infection. The lowest concentration of SARS-CoV-2 viral copies this assay can detect is 138 copies/mL. A negative result does not preclude SARS-Cov-2 infection and should not be used as the sole basis for treatment or other patient management decisions. A negative result may occur with  improper specimen collection/handling, submission of specimen other than nasopharyngeal swab, presence of viral mutation(s) within the areas targeted by this assay, and inadequate number of viral copies(<138 copies/mL). A negative result must be combined with clinical observations, patient history, and epidemiological information. The expected result is Negative.  Fact Sheet for Patients:  EntrepreneurPulse.com.au  Fact Sheet for Healthcare Providers:  IncredibleEmployment.be  This test is no t yet approved or cleared by the [place] FDA and  has been authorized for detection and/or diagnosis of SARS-CoV-2 by FDA under an Emergency Use Authorization (EUA). This EUA will remain  in effect (meaning this test can be used) for the duration of the COVID-19 declaration under Section 564(b)(1) of the Act, 21 U.S.C.section 360bbb-3(b)(1), unless the authorization is terminated  or revoked sooner.       Influenza A by PCR NEGATIVE NEGATIVE Final   Influenza B by PCR NEGATIVE NEGATIVE Final    Comment: (NOTE) The Xpert Xpress SARS-CoV-2/FLU/RSV plus assay is intended as an aid in the diagnosis of influenza from Nasopharyngeal swab specimens and should not be used as a sole basis for treatment. Nasal washings and aspirates are unacceptable for Xpert Xpress SARS-CoV-2/FLU/RSV testing.  Fact Sheet for Patients: EntrepreneurPulse.com.au  Fact Sheet for Healthcare Providers: IncredibleEmployment.be  This test is not yet approved or cleared by the [place] FDA and has been authorized for detection and/or  diagnosis of SARS-CoV-2 by FDA under an Emergency Use Authorization (EUA). This EUA will remain in effect (meaning this test can be used) for the duration of the COVID-19 declaration under Section 564(b)(1) of the Act, 21 U.S.C. section 360bbb-3(b)(1), unless the authorization is terminated or revoked.  Performed at [facility], [place] [address] [place], [place] [address]     RADIOLOGY STUDIES/RESULTS: DG Chest 1 View  Result Date: 10/20/2021 CLINICAL DATA:  Weakness, cough EXAM: CHEST  1 VIEW COMPARISON:  01/22/2021 FINDINGS: Lung volumes are low. There is no focal consolidation or edema. No pleural effusion or pneumothorax. Normal heart size. IMPRESSION: No acute process in the chest. Electronically Signed   By: Macy Mis M.D.   On: 10/20/2021 09:15     LOS: 0 days   Oren Binet, MD  [facility]    To contact the attending provider between  7A-7P or the covering provider during after hours 7P-7A, please log into the web site [email or web address] and access using universal [place] password for that web site. If you do not have the password, please call the hospital operator.  10/21/2021, 8:44 AM

## 2021-10-22 ENCOUNTER — Inpatient Hospital Stay (HOSPITAL_COMMUNITY): Payer: Medicaid Other

## 2021-10-22 DIAGNOSIS — R509 Fever, unspecified: Secondary | ICD-10-CM

## 2021-10-22 DIAGNOSIS — D61818 Other pancytopenia: Secondary | ICD-10-CM | POA: Diagnosis not present

## 2021-10-22 DIAGNOSIS — G8929 Other chronic pain: Secondary | ICD-10-CM | POA: Diagnosis not present

## 2021-10-22 DIAGNOSIS — E119 Type 2 diabetes mellitus without complications: Secondary | ICD-10-CM | POA: Diagnosis not present

## 2021-10-22 DIAGNOSIS — R519 Headache, unspecified: Secondary | ICD-10-CM | POA: Diagnosis not present

## 2021-10-22 DIAGNOSIS — B20 Human immunodeficiency virus [HIV] disease: Secondary | ICD-10-CM | POA: Diagnosis not present

## 2021-10-22 DIAGNOSIS — R5081 Fever presenting with conditions classified elsewhere: Secondary | ICD-10-CM | POA: Diagnosis not present

## 2021-10-22 DIAGNOSIS — G43109 Migraine with aura, not intractable, without status migrainosus: Secondary | ICD-10-CM | POA: Diagnosis not present

## 2021-10-22 LAB — URINE CULTURE: Culture: >=100000 — AB

## 2021-10-22 LAB — ECHOCARDIOGRAM COMPLETE
AR max vel: 2.25 cm2
AV Area VTI: 2.37 cm2
AV Area mean vel: 2.19 cm2
AV Mean grad: 5 mmHg
AV Peak grad: 10.5 mmHg
Ao pk vel: 1.62 m/s
Area-P 1/2: 3.83 cm2
Height: 67 in
MV VTI: 2.52 cm2
S' Lateral: 2.8 cm
Weight: 3968 oz

## 2021-10-22 LAB — CBC
HCT: 29.2 % — ABNORMAL LOW (ref 36.0–46.0)
Hemoglobin: 8.8 g/dL — ABNORMAL LOW (ref 12.0–15.0)
MCH: 22.2 pg — ABNORMAL LOW (ref 26.0–34.0)
MCHC: 30.1 g/dL (ref 30.0–36.0)
MCV: 73.6 fL — ABNORMAL LOW (ref 80.0–100.0)
Platelets: 149 10*3/uL — ABNORMAL LOW (ref 150–400)
RBC: 3.97 MIL/uL (ref 3.87–5.11)
RDW: 14.6 % (ref 11.5–15.5)
WBC: 2.7 10*3/uL — ABNORMAL LOW (ref 4.0–10.5)
nRBC: 0 % (ref 0.0–0.2)

## 2021-10-22 LAB — RETICULOCYTES
Immature Retic Fract: 25.4 % — ABNORMAL HIGH (ref 2.3–15.9)
RBC.: 3.68 MIL/uL — ABNORMAL LOW (ref 3.87–5.11)
Retic Count, Absolute: 93.1 10*3/uL (ref 19.0–186.0)
Retic Ct Pct: 2.5 % (ref 0.4–3.1)

## 2021-10-22 LAB — BASIC METABOLIC PANEL
Anion gap: 8 (ref 5–15)
BUN: 6 mg/dL (ref 6–20)
CO2: 23 mmol/L (ref 22–32)
Calcium: 8.2 mg/dL — ABNORMAL LOW (ref 8.9–10.3)
Chloride: 101 mmol/L (ref 98–111)
Creatinine, Ser: 0.67 mg/dL (ref 0.44–1.00)
GFR, Estimated: >60 mL/min (ref >60)
Glucose, Bld: 153 mg/dL — ABNORMAL HIGH (ref 70–99)
Potassium: 3.4 mmol/L — ABNORMAL LOW (ref 3.5–5.1)
Sodium: 132 mmol/L — ABNORMAL LOW (ref 135–145)

## 2021-10-22 LAB — IRON AND TIBC
Iron: 17 ug/dL — ABNORMAL LOW (ref 28–170)
Saturation Ratios: 6 % — ABNORMAL LOW (ref 10.4–31.8)
TIBC: 266 ug/dL (ref 250–450)
UIBC: 249 ug/dL

## 2021-10-22 LAB — GLUCOSE, CAPILLARY
Glucose-Capillary: 152 mg/dL — ABNORMAL HIGH (ref 70–99)
Glucose-Capillary: 225 mg/dL — ABNORMAL HIGH (ref 70–99)
Glucose-Capillary: 295 mg/dL — ABNORMAL HIGH (ref 70–99)
Glucose-Capillary: 322 mg/dL — ABNORMAL HIGH (ref 70–99)

## 2021-10-22 LAB — FERRITIN: Ferritin: 34 ng/mL (ref 11–307)

## 2021-10-22 LAB — FOLATE: Folate: 18 ng/mL (ref >5.9)

## 2021-10-22 LAB — VITAMIN B12: Vitamin B-12: 790 pg/mL (ref 180–914)

## 2021-10-22 MED ORDER — KETOROLAC TROMETHAMINE 15 MG/ML IJ SOLN
15.0000 mg | Freq: Once | INTRAMUSCULAR | Status: AC
  Administered 2021-10-22: 15 mg via INTRAVENOUS
  Filled 2021-10-22: qty 1

## 2021-10-22 MED ORDER — METOCLOPRAMIDE HCL 5 MG/ML IJ SOLN
10.0000 mg | Freq: Once | INTRAMUSCULAR | Status: AC
  Administered 2021-10-22: 10 mg via INTRAVENOUS
  Filled 2021-10-22: qty 2

## 2021-10-22 MED ORDER — CEFAZOLIN SODIUM-DEXTROSE 2-4 GM/100ML-% IV SOLN
2.0000 g | Freq: Three times a day (TID) | INTRAVENOUS | Status: DC
  Administered 2021-10-22 – 2021-10-23 (×4): 2 g via INTRAVENOUS
  Filled 2021-10-22 (×5): qty 100

## 2021-10-22 MED ORDER — INSULIN ASPART 100 UNIT/ML IJ SOLN
6.0000 [IU] | Freq: Three times a day (TID) | INTRAMUSCULAR | Status: DC
  Administered 2021-10-22 – 2021-10-23 (×2): 6 [IU] via SUBCUTANEOUS

## 2021-10-22 MED ORDER — GADOBUTROL 1 MMOL/ML IV SOLN
10.0000 mL | Freq: Once | INTRAVENOUS | Status: AC | PRN
  Administered 2021-10-22: 10 mL via INTRAVENOUS

## 2021-10-22 MED ORDER — FERROUS SULFATE 325 (65 FE) MG PO TABS
325.0000 mg | ORAL_TABLET | Freq: Two times a day (BID) | ORAL | Status: DC
  Administered 2021-10-22 – 2021-10-23 (×2): 325 mg via ORAL
  Filled 2021-10-22 (×2): qty 1

## 2021-10-22 MED ORDER — POTASSIUM CHLORIDE CRYS ER 20 MEQ PO TBCR
40.0000 meq | EXTENDED_RELEASE_TABLET | Freq: Once | ORAL | Status: AC
  Administered 2021-10-22: 40 meq via ORAL
  Filled 2021-10-22: qty 2

## 2021-10-22 MED ORDER — DIPHENHYDRAMINE HCL 50 MG/ML IJ SOLN
25.0000 mg | Freq: Once | INTRAMUSCULAR | Status: DC
  Filled 2021-10-22: qty 1

## 2021-10-22 NOTE — Progress Notes (Signed)
PROGRESS NOTE        PATIENT DETAILS Name: Beverly Johnson Age: 46 y.o. Sex: female Date of Birth: 01/05/1976 Admit Date: 10/20/2021 Admitting Physician Karmen Bongo, MD AQT:MAUQ, Diona Foley, NP  Brief Summary: Patient is a 46 y.o.  female with history of insulin-dependent DM-2, HIV-presenting with 33-monthhistory of intermittent fever, dry cough, vomiting and upper abdominal pain.  She was subsequently admitted to the hospitalist service for further evaluation and treatment.  See below for further details.   Significant [facility]: [facility] of 281-monthistory of intermittent fever/headache/vomiting/upper abdominal pain/dry cough  Significant imaging studies: 3/6>> CXR: No PNA 3/7>> CT chest/abdomen: Possible left acute pyelonephritis.  Significant microbiology data: 3/6>> COVID/influenza PCR: Negative 3/6>> blood culture: Negative 3/6>> urine culture: E. coli  Procedures: None  Consults:  Infectious disease Neurology  Subjective: Fever better-some nausea-continues to have headaches and some blurry vision.  Objective: Vitals: Blood pressure 108/74, pulse 96, temperature 98.2 F (36.8 C), temperature source Oral, resp. rate 17, height 5' 7"  (1.702 m), weight 112.5 kg, SpO2 98 %.   Exam: Gen Exam:Alert awake-not in any distress HEENT:atraumatic, normocephalic Chest: B/L clear to auscultation anteriorly CVS:S1S2 regular Abdomen:soft non tender, non distended Extremities:no edema Neurology: Non focal Skin: no rash   Pertinent Labs/Radiology: CBC Latest Ref Rng & Units 10/22/2021 10/21/2021 10/20/2021  WBC 4.0 - 10.5 K/uL 2.7(L) 4.6 -  Hemoglobin 12.0 - 15.0 g/dL 8.8(L) 9.1(L) 12.9  Hematocrit 36.0 - 46.0 % 29.2(L) 29.8(L) 38.0  Platelets 150 - 400 K/uL 149(L) 150 -    Lab Results  Component Value Date   NA 132 (L) 10/22/2021   K 3.4 (L) 10/22/2021   CL 101 10/22/2021   CO2 23 10/22/2021       Assessment/Plan: Fever/headaches/vomiting/fatigue: Unclear etiology-it is highly unlikely that pyelonephritis is causing the symptoms for the past 2 months-it may have caused her to have worsening symptoms for the past several days before she presented to the hospital.  Continue Rocephin-given headache/vomiting x2 months-we will get neurology evaluation-she is obese-question where she could have benign intracranial hypertension.  MRI with and without contrast has also been ordered to evaluate brain parenchyma.  Although she has HIV-it appears to be well controlled.  ID following as well.  HIV: Continue antiretrovirals.  Uncontrolled DM-2 with hyperglycemia (A1c 14.0 on 3/3): CBGs uncontrolled-we will get first dose of Semglee 30 units today-increase Premeal NovoLog to 6 units.  Follow and adjust   Recent Labs    10/21/21 2004 10/22/21 0735 10/22/21 1145  GLUCAP 275* 152* 225*     Hyponatremia: Probably due to mild dehydration in setting of persistent fever.  Slowly improving-continue supportive care.   Hypokalemia: Replete and recheck.  Microcytic anemia: Reported history of fibroids-has history of intermittent menorrhagia-appears to have iron deficiency.  Starting oral supplementation.    Mild leukopenia/thrombocytopenia: Unclear significance at this point-if no other cause of fever/headache/vomiting/fatigue is apparent-May need a bone marrow biopsy at some point.  HLD: Resume statins over the next few days.  Morbid obesity: Estimated body mass index is 38.84 kg/m as calculated from the following:   Height as of this encounter: 5' 7"  (1.702 m).   Weight as of this encounter: 112.5 kg.   Code status:   Code Status: Full Code   DVT Prophylaxis: enoxaparin (LOVENOX) injection 40 mg Start: 10/20/21  1400    Family Communication: None at bedside   Disposition Plan: Status is: Observation The patient will require care spanning > 2 midnights and should be moved to inpatient  because: Fever of unknown etiology-immunocompromised (HIV)-needs inpatient work-up-see above.   Planned Discharge Destination:Home   Diet: Diet Order             Diet Carb Modified Fluid consistency: Thin; Room service appropriate? Yes  Diet effective now                     Antimicrobial agents: Anti-infectives (From admission, onward)    Start     Dose/Rate Route Frequency Ordered Stop   10/22/21 0945  ceFAZolin (ANCEF) IVPB 2g/100 mL premix        2 g 200 mL/hr over 30 Minutes Intravenous Every 8 hours 10/22/21 0859     10/21/21 1330  cefTRIAXone (ROCEPHIN) 2 g in sodium chloride 0.9 % 100 mL IVPB  Status:  Discontinued        2 g 200 mL/hr over 30 Minutes Intravenous Daily 10/21/21 1233 10/22/21 0859   10/21/21 0800  elvitegravir-cobicistat-emtricitabine-tenofovir (GENVOYA) 150-150-200-10 MG tablet 1 tablet        1 tablet Oral Daily with breakfast 10/20/21 1358          MEDICATIONS: Scheduled Meds:  diphenhydrAMINE  25 mg Intravenous Once   docusate sodium  100 mg Oral BID   elvitegravir-cobicistat-emtricitabine-tenofovir  1 tablet Oral Q breakfast   enoxaparin (LOVENOX) injection  40 mg Subcutaneous Q24H   insulin aspart  0-15 Units Subcutaneous TID WC   insulin aspart  0-5 Units Subcutaneous QHS   insulin aspart  4 Units Subcutaneous TID WC   insulin glargine-yfgn  30 Units Subcutaneous Daily   ketorolac  15 mg Intravenous Once   metoCLOPramide (REGLAN) injection  10 mg Intravenous Once   sodium chloride flush  3 mL Intravenous Q12H   Continuous Infusions:   ceFAZolin (ANCEF) IV 2 g (10/22/21 1023)   lactated ringers 100 mL/hr at 10/22/21 0322   PRN Meds:.acetaminophen **OR** acetaminophen, benzonatate, bisacodyl, guaiFENesin-dextromethorphan, hydrALAZINE, methocarbamol, ondansetron **OR** ondansetron (ZOFRAN) IV, polyethylene glycol, traMADol   I have personally reviewed following labs and imaging studies  LABORATORY DATA: CBC: Recent Labs  Lab  10/20/21 0855 10/20/21 0915 10/21/21 0640 10/22/21 0246  WBC 6.7  --  4.6 2.7*  NEUTROABS 4.4  --   --   --   HGB 11.1* 12.9 9.1* 8.8*  HCT 35.9* 38.0 29.8* 29.2*  MCV 73.6*  --  72.9* 73.6*  PLT 201  --  150 149*     Basic Metabolic Panel: Recent Labs  Lab 10/20/21 0855 10/20/21 0915 10/21/21 0640 10/22/21 0246  NA 128* 132* 130* 132*  K 4.1 4.2 3.8 3.4*  CL 96*  --  98 101  CO2 19*  --  23 23  GLUCOSE 410*  --  210* 153*  BUN 9  --  8 6  CREATININE 0.98  --  0.73 0.67  CALCIUM 8.8*  --  8.2* 8.2*     GFR: Estimated Creatinine Clearance: 117.4 mL/min (by C-G formula based on SCr of 0.67 mg/dL).  Liver Function Tests: Recent Labs  Lab 10/20/21 0855  AST 27  ALT 14  ALKPHOS 99  BILITOT 0.8  PROT 8.3*  ALBUMIN 2.7*    No results for input(s): LIPASE, AMYLASE in the last 168 hours. No results for input(s): AMMONIA in the last 168 hours.  Coagulation Profile:  No results for input(s): INR, PROTIME in the last 168 hours.  Cardiac Enzymes: No results for input(s): CKTOTAL, CKMB, CKMBINDEX, TROPONINI in the last 168 hours.  BNP (last 3 results) No results for input(s): PROBNP in the last 8760 hours.  Lipid Profile: No results for input(s): CHOL, HDL, LDLCALC, TRIG, CHOLHDL, LDLDIRECT in the last 72 hours.  Thyroid Function Tests: No results for input(s): TSH, T4TOTAL, FREET4, T3FREE, THYROIDAB in the last 72 hours.  Anemia Panel: Recent Labs    10/22/21 0246  VITAMINB12 790  FOLATE 18.0  FERRITIN 34  TIBC 266  IRON 17*  RETICCTPCT 2.5    Urine analysis:    Component Value Date/Time   COLORURINE YELLOW 10/20/2021 1100   APPEARANCEUR CLOUDY (A) 10/20/2021 1100   LABSPEC 1.030 10/20/2021 1100   PHURINE 5.0 10/20/2021 1100   GLUCOSEU >=500 (A) 10/20/2021 1100   GLUCOSEU NEG mg/dL 09/17/2006 2003   HGBUR SMALL (A) 10/20/2021 1100   BILIRUBINUR NEGATIVE 10/20/2021 1100   KETONESUR 80 (A) 10/20/2021 1100   PROTEINUR 30 (A) 10/20/2021 1100    UROBILINOGEN 0.2 08/16/2021 1209   NITRITE NEGATIVE 10/20/2021 1100   LEUKOCYTESUR LARGE (A) 10/20/2021 1100    Sepsis Labs: Lactic Acid, Venous    Component Value Date/Time   LATICACIDVEN 1.0 10/20/2021 1410    MICROBIOLOGY: Recent Results (from the past 240 hour(s))  Resp Panel by RT-PCR (Flu A&B, Covid) Nasopharyngeal Swab     Status: None   Collection Time: 10/20/21  8:55 AM   Specimen: Nasopharyngeal Swab; Nasopharyngeal(NP) swabs in vial transport medium  Result Value Ref Range Status   SARS Coronavirus 2 by RT PCR NEGATIVE NEGATIVE Final    Comment: (NOTE) SARS-CoV-2 target nucleic acids are NOT DETECTED.  The SARS-CoV-2 RNA is generally detectable in upper respiratory specimens during the acute phase of infection. The lowest concentration of SARS-CoV-2 viral copies this assay can detect is 138 copies/mL. A negative result does not preclude SARS-Cov-2 infection and should not be used as the sole basis for treatment or other patient management decisions. A negative result may occur with  improper specimen collection/handling, submission of specimen other than nasopharyngeal swab, presence of viral mutation(s) within the areas targeted by this assay, and inadequate number of viral copies(<138 copies/mL). A negative result must be combined with clinical observations, patient history, and epidemiological information. The expected result is Negative.  Fact Sheet for Patients:  EntrepreneurPulse.com.au  Fact Sheet for Healthcare Providers:  IncredibleEmployment.be  This test is no t yet approved or cleared by the [place] FDA and  has been authorized for detection and/or diagnosis of SARS-CoV-2 by FDA under an Emergency Use Authorization (EUA). This EUA will remain  in effect (meaning this test can be used) for the duration of the COVID-19 declaration under Section 564(b)(1) of the Act, 21 U.S.C.section 360bbb-3(b)(1), unless the  authorization is terminated  or revoked sooner.       Influenza A by PCR NEGATIVE NEGATIVE Final   Influenza B by PCR NEGATIVE NEGATIVE Final    Comment: (NOTE) The Xpert Xpress SARS-CoV-2/FLU/RSV plus assay is intended as an aid in the diagnosis of influenza from Nasopharyngeal swab specimens and should not be used as a sole basis for treatment. Nasal washings and aspirates are unacceptable for Xpert Xpress SARS-CoV-2/FLU/RSV testing.  Fact Sheet for Patients: EntrepreneurPulse.com.au  Fact Sheet for Healthcare Providers: IncredibleEmployment.be  This test is not yet approved or cleared by the [place] FDA and has been authorized for detection and/or diagnosis of  SARS-CoV-2 by FDA under an Emergency Use Authorization (EUA). This EUA will remain in effect (meaning this test can be used) for the duration of the COVID-19 declaration under Section 564(b)(1) of the Act, 21 U.S.C. section 360bbb-3(b)(1), unless the authorization is terminated or revoked.  Performed at [facility], [place] [address], [place], [place] [address]   Urine Culture     Status: Abnormal   Collection Time: 10/20/21 11:00 AM   Specimen: Urine, Clean Catch  Result Value Ref Range Status   Specimen Description URINE, CLEAN CATCH  Final   Special Requests   Final    NONE Performed at [facility], [place] [address], [place], [place] [address]    Culture >=100,000 COLONIES/mL ESCHERICHIA COLI (A)  Final   Report Status 10/22/2021 FINAL  Final   Organism ID, Bacteria ESCHERICHIA COLI (A)  Final      Susceptibility   Escherichia coli - MIC*    AMPICILLIN 4 SENSITIVE Sensitive     CEFAZOLIN <=4 SENSITIVE Sensitive     CEFEPIME <=0.12 SENSITIVE Sensitive     CEFTRIAXONE <=0.25 SENSITIVE Sensitive     CIPROFLOXACIN <=0.25 SENSITIVE Sensitive     GENTAMICIN <=1 SENSITIVE Sensitive     IMIPENEM <=0.25 SENSITIVE Sensitive     NITROFURANTOIN <=16 SENSITIVE  Sensitive     TRIMETH/SULFA <=20 SENSITIVE Sensitive     AMPICILLIN/SULBACTAM <=2 SENSITIVE Sensitive     PIP/TAZO <=4 SENSITIVE Sensitive     * >=100,000 COLONIES/mL ESCHERICHIA COLI  Culture, blood (routine x 2)     Status: None (Preliminary result)   Collection Time: 10/20/21  2:10 PM   Specimen: BLOOD  Result Value Ref Range Status   Specimen Description BLOOD LEFT ANTECUBITAL  Final   Special Requests   Final    BOTTLES DRAWN AEROBIC AND ANAEROBIC Blood Culture results may not be optimal due to an inadequate volume of blood received in culture bottles   Culture   Final    NO GROWTH 2 DAYS Performed at [place] [address], [place], [place] [address]    Report Status PENDING  Incomplete  Culture, blood (routine x 2)     Status: None (Preliminary result)   Collection Time: 10/20/21  2:10 PM   Specimen: BLOOD LEFT HAND  Result Value Ref Range Status   Specimen Description BLOOD LEFT HAND  Final   Special Requests   Final    BOTTLES DRAWN AEROBIC AND ANAEROBIC Blood Culture results may not be optimal due to an inadequate volume of blood received in culture bottles   Culture   Final    NO GROWTH 2 DAYS Performed at [facility], [place] [address] [place], [place] [address]    Report Status PENDING  Incomplete    RADIOLOGY STUDIES/RESULTS: CT CHEST ABDOMEN PELVIS W CONTRAST  Result Date: 10/21/2021 CLINICAL DATA:  Sepsis.  Fever and vomiting.  Generalized body ache. EXAM: CT CHEST, ABDOMEN, AND PELVIS WITH CONTRAST TECHNIQUE: Multidetector CT imaging of the chest, abdomen and pelvis was performed following the standard protocol during bolus administration of intravenous contrast. RADIATION DOSE REDUCTION: This exam was performed according to the departmental dose-optimization program which includes automated exposure control, adjustment of the mA and/or kV according to patient size and/or use of iterative reconstruction technique. CONTRAST:  73m OMNIPAQUE IOHEXOL  300 MG/ML  SOLN COMPARISON:  One-view chest x-ray 10/20/2021 FINDINGS: CT CHEST FINDINGS Cardiovascular: Heart size is normal. The aorta and great vessel origins are  within normal limits. Pulmonary arteries are unremarkable. No significant pericardial effusion is present. Mediastinum/Nodes: No enlarged mediastinal, hilar, or axillary lymph nodes. Thyroid gland, trachea, and esophagus demonstrate no significant findings. Lungs/Pleura: Minimal dependent atelectasis is present at both lung bases. No nodule or mass lesion is present. No significant pleural effusion or pneumothorax is present. Musculoskeletal: No chest wall mass or suspicious bone lesions identified. CT ABDOMEN PELVIS FINDINGS Hepatobiliary: No focal liver abnormality is seen. Status post cholecystectomy. No biliary dilatation. Pancreas: Unremarkable. No pancreatic ductal dilatation or surrounding inflammatory changes. Spleen: Normal in size without focal abnormality. Adrenals/Urinary Tract: The adrenal glands are normal bilaterally. Right kidney a is within normal limits. No mass lesion, stone or obstruction is present. Multifocal areas of decreased perfusion are present within the left renal parenchyma with surrounding inflammatory change. No discrete mass lesion is present. The largest area of decreased perfusion is in the posterolateral left kidney with marked surrounding inflammatory change. No stone or obstruction is present. The ureters are within normal limits bilaterally. The urinary bladder is normal. Stomach/Bowel: Stomach and duodenum are within normal limits. Small bowel is unremarkable. Terminal ileum is within normal limits. The appendix is visualized and normal. The ascending and transverse colon are within normal limits. The descending and sigmoid colon are normal. Vascular/Lymphatic: No significant vascular findings are present. No enlarged abdominal or pelvic lymph nodes. Reproductive: Fibroid at the fundus the uterus measures up to  7.5 cm. Adnexa are unremarkable. Other: No abdominal wall hernia or abnormality. No abdominopelvic ascites. Musculoskeletal: No acute or significant osseous findings. IMPRESSION: 1. Multifocal areas of decreased perfusion within the left renal parenchyma with surrounding inflammatory change. Findings are consistent with acute pyelonephritis. No discrete mass lesion is present. 2. No other acute or focal abnormality to explain the patient's symptoms. 3. 7.5 cm uterine fibroid. 4. Status post cholecystectomy. Electronically Signed   By: San Morelle M.D.   On: 10/21/2021 11:46     LOS: 1 day   Oren Binet, MD  [facility]    To contact the attending provider between 7A-7P or the covering provider during after hours 7P-7A, please log into the web site [email or web address] and access using universal [place] password for that web site. If you do not have the password, please call the hospital operator.  10/22/2021, 12:21 PM

## 2021-10-22 NOTE — Progress Notes (Signed)
?  [facility] ? ? ?Reason for visit: Follow up on fever ? ?Interval History: fever curve trending down, having headache today, less n/v and eating better now.   ?Day 2 antibiotics ? ?Physical Exam: ?Constitutional:  ?Vitals:  ? 10/22/21 0323 10/22/21 0734  ?BP: 117/74 108/74  ?Pulse: 87 96  ?Resp: 20 17  ?Temp: 98.3 ?F (36.8 ?C) 98.2 ?F (36.8 ?C)  ?SpO2: 100% 98%  ? patient appears in NAD ?Respiratory: Normal respiratory effort; CTA B ?Cardiovascular: RRR ?GI: soft, nt, nd ? ?Review of Systems: ?Constitutional: positive for fatigue and malaise or negative for fevers and chills ?Gastrointestinal: positive for nausea and diarrhea, negative for vomiting ?Integument/breast: negative for rash ?Hematologic/lymphatic: negative for lymphadenopathy and petechiae ?Musculoskeletal: negative for myalgias and arthralgias ? ?Lab Results  ?Component Value Date  ? WBC 2.7 (L) 10/22/2021  ? HGB 8.8 (L) 10/22/2021  ? HCT 29.2 (L) 10/22/2021  ? MCV 73.6 (L) 10/22/2021  ? PLT 149 (L) 10/22/2021  ?  ?Lab Results  ?Component Value Date  ? CREATININE 0.67 10/22/2021  ? BUN 6 10/22/2021  ? NA 132 (L) 10/22/2021  ? K 3.4 (L) 10/22/2021  ? CL 101 10/22/2021  ? CO2 23 10/22/2021  ?  ?Lab Results  ?Component Value Date  ? ALT 14 10/20/2021  ? AST 27 10/20/2021  ? ALKPHOS 99 10/20/2021  ?  ? ?Microbiology: ?Recent Results (from the past 240 hour(s))  ?Resp Panel by RT-PCR (Flu A&B, Covid) Nasopharyngeal Swab     Status: None  ? Collection Time: 10/20/21  8:55 AM  ? Specimen: Nasopharyngeal Swab; Nasopharyngeal(NP) swabs in vial transport medium  ?Result Value Ref Range Status  ? SARS Coronavirus 2 by RT PCR NEGATIVE NEGATIVE Final  ?  Comment: (NOTE) ?SARS-CoV-2 target nucleic acids are NOT DETECTED. ? ?The SARS-CoV-2 RNA is generally detectable in upper respiratory ?specimens during the acute phase of infection. The lowest ?concentration of SARS-CoV-2 viral copies this assay can detect is ?138 copies/mL. A negative  result does not preclude SARS-Cov-2 ?infection and should not be used as the sole basis for treatment or ?other patient management decisions. A negative result may occur with  ?improper specimen collection/handling, submission of specimen other ?than nasopharyngeal swab, presence of viral mutation(s) within the ?areas targeted by this assay, and inadequate number of viral ?copies(<138 copies/mL). A negative result must be combined with ?clinical observations, patient history, and epidemiological ?information. The expected result is Negative. ? ?Fact Sheet for Patients:  ?EntrepreneurPulse.com.au ? ?Fact Sheet for Healthcare Providers:  ?IncredibleEmployment.be ? ?This test is no t yet approved or cleared by the [place] FDA and  ?has been authorized for detection and/or diagnosis of SARS-CoV-2 by ?FDA under an Emergency Use Authorization (EUA). This EUA will remain  ?in effect (meaning this test can be used) for the duration of the ?COVID-19 declaration under Section 564(b)(1) of the Act, 21 ?U.S.C.section 360bbb-3(b)(1), unless the authorization is terminated  ?or revoked sooner.  ? ? ?  ? Influenza A by PCR NEGATIVE NEGATIVE Final  ? Influenza B by PCR NEGATIVE NEGATIVE Final  ?  Comment: (NOTE) ?The Xpert Xpress SARS-CoV-2/FLU/RSV plus assay is intended as an aid ?in the diagnosis of influenza from Nasopharyngeal swab specimens and ?should not be used as a sole basis for treatment. Nasal washings and ?aspirates are unacceptable for Xpert Xpress SARS-CoV-2/FLU/RSV ?testing. ? ?Fact Sheet for Patients: ?EntrepreneurPulse.com.au ? ?Fact Sheet for Healthcare Providers: ?IncredibleEmployment.be ? ?This test is not yet approved or cleared  by the [place] and ?has been authorized for detection and/or diagnosis of SARS-CoV-2 by ?FDA under an Emergency Use Authorization (EUA). This EUA will remain ?in effect (meaning this test can be used)  for the duration of the ?COVID-19 declaration under Section 564(b)(1) of the Act, 21 U.S.C. ?section 360bbb-3(b)(1), unless the authorization is terminated or ?revoked. ? ?Performed at [facility], [place] [address], [place], [place] ?[address] ?  ?Urine Culture     Status: Abnormal  ? Collection Time: 10/20/21 11:00 AM  ? Specimen: Urine, Clean Catch  ?Result Value Ref Range Status  ? Specimen Description URINE, CLEAN CATCH  Final  ? Special Requests   Final  ?  NONE ?Performed at [facility], [place] [address], [place], [place] [address] ?  ? Culture >=100,000 COLONIES/mL ESCHERICHIA COLI (A)  Final  ? Report Status 10/22/2021 FINAL  Final  ? Organism ID, Bacteria ESCHERICHIA COLI (A)  Final  ?    Susceptibility  ? Escherichia coli - MIC*  ?  AMPICILLIN 4 SENSITIVE Sensitive   ?  CEFAZOLIN <=4 SENSITIVE Sensitive   ?  CEFEPIME <=0.12 SENSITIVE Sensitive   ?  CEFTRIAXONE <=0.25 SENSITIVE Sensitive   ?  CIPROFLOXACIN <=0.25 SENSITIVE Sensitive   ?  GENTAMICIN <=1 SENSITIVE Sensitive   ?  IMIPENEM <=0.25 SENSITIVE Sensitive   ?  NITROFURANTOIN <=16 SENSITIVE Sensitive   ?  TRIMETH/SULFA <=20 SENSITIVE Sensitive   ?  AMPICILLIN/SULBACTAM <=2 SENSITIVE Sensitive   ?  PIP/TAZO <=4 SENSITIVE Sensitive   ?  * >=100,000 COLONIES/mL ESCHERICHIA COLI  ?Culture, blood (routine x 2)     Status: None (Preliminary result)  ? Collection Time: 10/20/21  2:10 PM  ? Specimen: BLOOD  ?Result Value Ref Range Status  ? Specimen Description BLOOD LEFT ANTECUBITAL  Final  ? Special Requests   Final  ?  BOTTLES DRAWN AEROBIC AND ANAEROBIC Blood Culture results may not be optimal due to an inadequate volume of blood received in culture bottles  ? Culture   Final  ?  NO GROWTH 2 DAYS ?Performed at [facility], [place] [address], [place], [place] [address] ?  ? Report Status PENDING  Incomplete  ?Culture, blood (routine x 2)     Status: None (Preliminary result)  ? Collection Time: 10/20/21  2:10 PM  ? Specimen: BLOOD LEFT HAND   ?Result Value Ref Range Status  ? Specimen Description BLOOD LEFT HAND  Final  ? Special Requests   Final  ?  BOTTLES DRAWN AEROBIC AND ANAEROBIC Blood Culture results may not be optimal due to an inadequate volume of blood received in culture bottles  ? Culture   Final  ?  NO GROWTH 2 DAYS ?Performed at [facility], [place] [address] [place], [place] [address] ?  ? Report Status PENDING  Incomplete  ? ? ?Impression/Plan:  ?1. Fever - trending down and feeling better overall.  Urine culture noted and with E coli, pansensitive though she continues to be asymptomatic from a urinary infection standpoint.  Still an unclear cause of the fever though this is relatively new.   ?Will continue with antibiotics for now ?Will also check a TTE  ? ?2.  Headache - associated with n/v and unclear etiology.  This has been an issues for over a month and is new.  Her HIV is well-controlled so I do not suspect an opportunistic infection and CD4 is 283.  I will check an MRI  of her brain to rule out space occupying lesion or other concerns.   ? ?3.  HIV - she has been well-controlled and has not been missing any doses.   ?Continue Biktarvy ? ?4.  Pancytopenia - this is new with a minimally decreased platelet level, new leukopenia and ongoing anemia.  Unclear etiology.  May be reactive, mentrual blood loss and some dilutional effect from fluids.   ?Will recheck her CBC tomorrow with diff ? ?  ?

## 2021-10-22 NOTE — Consult Note (Signed)
Neurology Consultation  Reason for Consult: Headaches Referring Physician: Dr. Sloan Leiter  CC: Headaches  History is obtained from: Patient, chart  HPI: Beverly Johnson is a 46 y.o. female past medical history of diabetes, well-controlled HIV, came in for evaluation of off-and-on fevers.  She had been feeling poorly with nausea vomiting and diarrhea prior to presentation and felt that she had been sick.  She developed fevers up to upwards of 103 Fahrenheit.  Compliant with her antiretroviral medication. Last check A1c 14. During this admission, noted to be somewhat pancytopenic-has menorrhagia and has always had anemia. Fevers have been ongoing for 2 months. Her urine culture was positive for E. coli with CT imaging showing possible pyelonephritis which is being treated by the primary team. ID has low suspicion for opportunistic infection given the CD4 count of 283. In addition, has been having headaches off and on for the past multiple months.  Describes the headache mostly behind the right eye, no aggravating or relieving factors, feels like a constant pain-does not feel like pressure or stabbing or pulsating kind of pain.  Has no correlation with her position.  Headache is not associated with any specific time of the day.  Also reports some blurred vision from the same eye when she has a headache.  Reports pain with movement of the right eye. Headache is not necessarily associated with nausea or vomiting.  Does not describe visual symptoms concerning for transient visual obscurations   ROS: Full ROS was performed and is negative except as noted in the HPI.   Past Medical History:  Diagnosis Date   DM2 (diabetes mellitus, type 2) ([place])    HIV infection ([place])     Family History  Problem Relation Age of Onset   Healthy Mother    Healthy Father      Social History:   reports that she has never smoked. She has never used smokeless tobacco. She reports that she does not currently use  drugs after having used the following drugs: Marijuana. She reports that she does not drink alcohol.  Medications  Current Facility-Administered Medications:    acetaminophen (TYLENOL) tablet 650 mg, 650 mg, Oral, Q6H PRN, 650 mg at 10/21/21 2029 **OR** acetaminophen (TYLENOL) suppository 650 mg, 650 mg, Rectal, Q6H PRN, Karmen Bongo, MD   benzonatate (TESSALON) capsule 200 mg, 200 mg, Oral, TID PRN, Jonetta Osgood, MD, 200 mg at 10/21/21 1600   bisacodyl (DULCOLAX) EC tablet 5 mg, 5 mg, Oral, Daily PRN, Karmen Bongo, MD   ceFAZolin (ANCEF) IVPB 2g/100 mL premix, 2 g, Intravenous, Q8H, Comer, Okey Regal, MD, Last Rate: 200 mL/hr at 10/22/21 1023, 2 g at 10/22/21 1023   docusate sodium (COLACE) capsule 100 mg, 100 mg, Oral, BID, Karmen Bongo, MD, 100 mg at 10/21/21 1034   elvitegravir-cobicistat-emtricitabine-tenofovir (GENVOYA) 150-150-200-10 MG tablet 1 tablet, 1 tablet, Oral, Q breakfast, Karmen Bongo, MD, 1 tablet at 10/22/21 0818   enoxaparin (LOVENOX) injection 40 mg, 40 mg, Subcutaneous, Q24H, Karmen Bongo, MD, 40 mg at 10/21/21 1307   guaiFENesin-dextromethorphan (ROBITUSSIN DM) 100-10 MG/5ML syrup 5 mL, 5 mL, Oral, Q4H PRN, Ghimire, Henreitta Leber, MD   hydrALAZINE (APRESOLINE) injection 5 mg, 5 mg, Intravenous, Q4H PRN, Karmen Bongo, MD   insulin aspart (novoLOG) injection 0-15 Units, 0-15 Units, Subcutaneous, TID WC, Karmen Bongo, MD, 3 Units at 10/22/21 0819   insulin aspart (novoLOG) injection 0-5 Units, 0-5 Units, Subcutaneous, QHS, Karmen Bongo, MD, 3 Units at 10/21/21 2023   insulin aspart (novoLOG) injection 4  Units, 4 Units, Subcutaneous, TID WC, Ghimire, Henreitta Leber, MD, 4 Units at 10/22/21 0823   insulin glargine-yfgn [facility]) injection 30 Units, 30 Units, Subcutaneous, Daily, Ghimire, Henreitta Leber, MD, 30 Units at 10/22/21 3888   lactated ringers infusion, , Intravenous, Continuous, Ghimire, Henreitta Leber, MD, Last Rate: 100 mL/hr at 10/22/21 0322, New Bag at  10/22/21 0322   methocarbamol (ROBAXIN) tablet 500 mg, 500 mg, Oral, QHS PRN, Karmen Bongo, MD, 500 mg at 10/21/21 2029   ondansetron (ZOFRAN) tablet 4 mg, 4 mg, Oral, Q6H PRN **OR** ondansetron (ZOFRAN) injection 4 mg, 4 mg, Intravenous, Q6H PRN, Karmen Bongo, MD   polyethylene glycol (MIRALAX / GLYCOLAX) packet 17 g, 17 g, Oral, Daily PRN, Karmen Bongo, MD   sodium chloride flush (NS) 0.9 % injection 3 mL, 3 mL, Intravenous, Q12H, Karmen Bongo, MD, 3 mL at 10/22/21 0916   traMADol (ULTRAM) tablet 50 mg, 50 mg, Oral, Q6H PRN, Karmen Bongo, MD, 50 mg at 10/21/21 1710   Exam: Current vital signs: BP 108/74 (BP Location: Left Arm)    Pulse 96    Temp 98.2 F (36.8 C) (Oral)    Resp 17    Ht '5\' 7"'$  (1.702 m)    Wt 112.5 kg    SpO2 98%    BMI 38.84 kg/m  Vital signs in last 24 hours: Temp:  [98.2 F (36.8 C)-99.5 F (37.5 C)] 98.2 F (36.8 C) (03/08 0734) Pulse Rate:  [87-109] 96 (03/08 0734) Resp:  [16-20] 17 (03/08 0734) BP: (108-129)/(73-75) 108/74 (03/08 0734) SpO2:  [98 %-100 %] 98 % (03/08 0734)  GENERAL: Awake, alert in NAD HEENT: - Normocephalic and atraumatic, dry mm, no LN++, no Thyromegally LUNGS - Clear to auscultation bilaterally with no wheezes CV - S1S2 RRR, no m/r/g, equal pulses bilaterally. ABDOMEN - Soft, nontender, nondistended with normoactive BS Ext: warm, well perfused, intact peripheral pulses  NEURO:  Mental Status: AA&Ox3  Language: speech is nondysarthric.  Naming, repetition, fluency, and comprehension intact. Cranial Nerves: PERR, difficult to perform funduscopy given small pupil size. EOMI, visual fields full, no facial asymmetry, facial sensation intact, hearing intact, tongue/uvula/soft palate midline, normal sternocleidomastoid and trapezius muscle strength. No evidence of tongue atrophy or fibrillations Motor: 5/5 without drift in all fours Tone: is normal and bulk is normal Sensation- Intact to light touch bilaterally Coordination: FTN  intact bilaterally Gait- deferred  Labs I have reviewed labs in epic and the results pertinent to this consultation are:   CBC    Component Value Date/Time   WBC 2.7 (L) 10/22/2021 0246   RBC 3.97 10/22/2021 0246   RBC 3.68 (L) 10/22/2021 0246   HGB 8.8 (L) 10/22/2021 0246   HCT 29.2 (L) 10/22/2021 0246   PLT 149 (L) 10/22/2021 0246   MCV 73.6 (L) 10/22/2021 0246   MCH 22.2 (L) 10/22/2021 0246   MCHC 30.1 10/22/2021 0246   RDW 14.6 10/22/2021 0246   LYMPHSABS 1.6 10/20/2021 0855   MONOABS 0.7 10/20/2021 0855   EOSABS 0.0 10/20/2021 0855   BASOSABS 0.0 10/20/2021 0855    CMP     Component Value Date/Time   NA 132 (L) 10/22/2021 0246   NA 131 (L) 01/22/2017 1535   K 3.4 (L) 10/22/2021 0246   CL 101 10/22/2021 0246   CO2 23 10/22/2021 0246   GLUCOSE 153 (H) 10/22/2021 0246   BUN 6 10/22/2021 0246   BUN 8 01/22/2017 1535   CREATININE 0.67 10/22/2021 0246   CREATININE 0.64 11/05/2020 0957  CALCIUM 8.2 (L) 10/22/2021 0246   PROT 8.3 (H) 10/20/2021 0855   PROT 7.4 01/22/2017 1535   ALBUMIN 2.7 (L) 10/20/2021 0855   ALBUMIN 4.0 01/22/2017 1535   AST 27 10/20/2021 0855   ALT 14 10/20/2021 0855   ALKPHOS 99 10/20/2021 0855   BILITOT 0.8 10/20/2021 0855   BILITOT 0.3 01/22/2017 1535   GFRNONAA >60 10/22/2021 0246   GFRNONAA 109 11/05/2020 0957   GFRAA 127 11/05/2020 0957   Imaging I have reviewed the images obtained: CT chest abdomen pelvis concerning for acute pyelonephritis in the left kidney.  7.5 cm uterine fibroid.  Assessment:  46 year old with history of poorly controlled diabetes and well-controlled HIV compliant to her HIV medications, presenting for evaluation of fevers, found to have pyelonephritis/UTI for which she is being treated with antibiotics.  Neurological consultation obtained for history of headaches that dates back multiple months.  Off-and-on headaches, on my history taking, not very concerning for IIH T. Although her HIV is well controlled, she  is pancytopenic which might purely be from her menorrhagia, I would evaluate further with imaging to look for any evidence of enhancement that might suggest opportunistic infection. Also, the headache she describes as behind her right eye and more of retro-orbital pain with painful eye movements.  MRI would be also helpful to look for any evidence of demyelination such as optic neuritis.  Her headache description sounds less likely like migraine.  Recommendations: MRI of the brain with and without contrast Symptomatic headache management per primary team as you are Can try giving her a dose of migraine cocktail to see if that helps with Toradol/Reglan/Benadryl IV. Treatment of the polynephritis as you are-pyonephritis could be contributing to fever and headaches I will follow with you. Plan discussed with Dr. Sloan Leiter  -- Amie Portland, MD Neurologist [facility] Pager: [phone]

## 2021-10-22 NOTE — Progress Notes (Signed)
Inpatient Diabetes Program Recommendations ? ?AACE/ADA: New Consensus Statement on Inpatient Glycemic Control  ?Target Ranges:  Prepandial:   less than 140 mg/dL ?     Peak postprandial:   less than 180 mg/dL (1-2 hours) ?     Critically ill patients:  140 - 180 mg/dL  ? ? Latest Reference Range & Units 10/22/21 02:46  ?Glucose 70 - 99 mg/dL 153 (H)  ? ? Latest Reference Range & Units 10/21/21 07:47 10/21/21 11:49 10/21/21 16:59 10/21/21 20:04  ?Glucose-Capillary 70 - 99 mg/dL 193 (H) 308 (H) 276 (H) 275 (H)  ? ?Review of Glycemic Control ? ?Diabetes history: DM2 ?Outpatient Diabetes medications: Lantus 20 units daily, Metformin 50 mg BID, Glipizide 10 mg BID, Januvia 100 mg daily ?Current orders for Inpatient glycemic control: Semglee 30 units daily, Novolog 4 units TID with meals, Novolog 0-15 units TID with meals, Novolog 0-5 units QHS ? ?Inpatient Diabetes Program Recommendations:   ? ?Insulin: Please consider increasing meal coverage to Novolog 7 units TID with meals. ? ?Thanks, ?Barnie Alderman, RN, MSN, CDE ?Diabetes Coordinator ?Inpatient Diabetes Program ?[phone] (Team Pager from 8am to 5pm) ? ? ? ?

## 2021-10-23 ENCOUNTER — Other Ambulatory Visit (HOSPITAL_COMMUNITY): Payer: Self-pay

## 2021-10-23 DIAGNOSIS — G43109 Migraine with aura, not intractable, without status migrainosus: Secondary | ICD-10-CM

## 2021-10-23 DIAGNOSIS — R519 Headache, unspecified: Secondary | ICD-10-CM | POA: Diagnosis not present

## 2021-10-23 DIAGNOSIS — G8929 Other chronic pain: Secondary | ICD-10-CM | POA: Diagnosis not present

## 2021-10-23 DIAGNOSIS — R5081 Fever presenting with conditions classified elsewhere: Secondary | ICD-10-CM | POA: Diagnosis not present

## 2021-10-23 DIAGNOSIS — E119 Type 2 diabetes mellitus without complications: Secondary | ICD-10-CM | POA: Diagnosis not present

## 2021-10-23 DIAGNOSIS — B20 Human immunodeficiency virus [HIV] disease: Secondary | ICD-10-CM | POA: Diagnosis not present

## 2021-10-23 LAB — CBC WITH DIFFERENTIAL/PLATELET
Abs Immature Granulocytes: 0.03 10*3/uL (ref 0.00–0.07)
Basophils Absolute: 0 10*3/uL (ref 0.0–0.1)
Basophils Relative: 0 %
Eosinophils Absolute: 0 10*3/uL (ref 0.0–0.5)
Eosinophils Relative: 1 %
HCT: 28.2 % — ABNORMAL LOW (ref 36.0–46.0)
Hemoglobin: 8.6 g/dL — ABNORMAL LOW (ref 12.0–15.0)
Immature Granulocytes: 1 %
Lymphocytes Relative: 40 %
Lymphs Abs: 1.1 10*3/uL (ref 0.7–4.0)
MCH: 22.3 pg — ABNORMAL LOW (ref 26.0–34.0)
MCHC: 30.5 g/dL (ref 30.0–36.0)
MCV: 73.2 fL — ABNORMAL LOW (ref 80.0–100.0)
Monocytes Absolute: 0.3 10*3/uL (ref 0.1–1.0)
Monocytes Relative: 10 %
Neutro Abs: 1.3 10*3/uL — ABNORMAL LOW (ref 1.7–7.7)
Neutrophils Relative %: 48 %
Platelets: 156 10*3/uL (ref 150–400)
RBC: 3.85 MIL/uL — ABNORMAL LOW (ref 3.87–5.11)
RDW: 14.8 % (ref 11.5–15.5)
WBC: 2.8 10*3/uL — ABNORMAL LOW (ref 4.0–10.5)
nRBC: 0 % (ref 0.0–0.2)

## 2021-10-23 LAB — RETICULOCYTES
Immature Retic Fract: 13.7 % (ref 2.3–15.9)
RBC.: 3.82 MIL/uL — ABNORMAL LOW (ref 3.87–5.11)
Retic Count, Absolute: 27.5 10*3/uL (ref 19.0–186.0)
Retic Ct Pct: 0.7 % (ref 0.4–3.1)

## 2021-10-23 LAB — COMPREHENSIVE METABOLIC PANEL
ALT: 15 U/L (ref 0–44)
AST: 24 U/L (ref 15–41)
Albumin: 2 g/dL — ABNORMAL LOW (ref 3.5–5.0)
Alkaline Phosphatase: 135 U/L — ABNORMAL HIGH (ref 38–126)
Anion gap: 6 (ref 5–15)
BUN: 6 mg/dL (ref 6–20)
CO2: 26 mmol/L (ref 22–32)
Calcium: 8.3 mg/dL — ABNORMAL LOW (ref 8.9–10.3)
Chloride: 103 mmol/L (ref 98–111)
Creatinine, Ser: 0.74 mg/dL (ref 0.44–1.00)
GFR, Estimated: >60 mL/min (ref >60)
Glucose, Bld: 294 mg/dL — ABNORMAL HIGH (ref 70–99)
Potassium: 3.9 mmol/L (ref 3.5–5.1)
Sodium: 135 mmol/L (ref 135–145)
Total Bilirubin: 0.3 mg/dL (ref 0.3–1.2)
Total Protein: 6.3 g/dL — ABNORMAL LOW (ref 6.5–8.1)

## 2021-10-23 LAB — MAGNESIUM: Magnesium: 1.7 mg/dL (ref 1.7–2.4)

## 2021-10-23 LAB — LACTATE DEHYDROGENASE: LDH: 178 U/L (ref 98–192)

## 2021-10-23 LAB — GLUCOSE, CAPILLARY: Glucose-Capillary: 229 mg/dL — ABNORMAL HIGH (ref 70–99)

## 2021-10-23 MED ORDER — TOPIRAMATE 25 MG PO TABS
ORAL_TABLET | ORAL | 2 refills | Status: DC
  Filled 2021-10-23: qty 90, 30d supply, fill #0

## 2021-10-23 MED ORDER — CEPHALEXIN 500 MG PO CAPS
500.0000 mg | ORAL_CAPSULE | Freq: Three times a day (TID) | ORAL | 0 refills | Status: AC
  Filled 2021-10-23: qty 15, 5d supply, fill #0

## 2021-10-23 MED ORDER — INSULIN GLARGINE-YFGN 100 UNIT/ML ~~LOC~~ SOLN
36.0000 [IU] | Freq: Every day | SUBCUTANEOUS | Status: DC
  Administered 2021-10-23: 10:00:00 36 [IU] via SUBCUTANEOUS
  Filled 2021-10-23: qty 0.36

## 2021-10-23 MED ORDER — MAGNESIUM SULFATE 2 GM/50ML IV SOLN
2.0000 g | Freq: Once | INTRAVENOUS | Status: AC
  Administered 2021-10-23: 10:00:00 2 g via INTRAVENOUS
  Filled 2021-10-23: qty 50

## 2021-10-23 MED ORDER — INSULIN GLARGINE 100 UNIT/ML SOLOSTAR PEN
36.0000 [IU] | PEN_INJECTOR | Freq: Every day | SUBCUTANEOUS | 11 refills | Status: DC
  Filled 2021-10-23: qty 15, 41d supply, fill #0

## 2021-10-23 NOTE — Progress Notes (Signed)
NEUROLOGY CONSULTATION PROGRESS NOTE   Date of service: October 23, 2021 Patient Name: Beverly Johnson MRN:  [id number] DOB:  02/06/1976  Brief HPI  Beverly Johnson is a 46 y.o. female past medical history of diabetes, well-controlled HIV, came in for evaluation of off-and-on fevers.  She had been feeling poorly with nausea vomiting and diarrhea prior to presentation and felt that she had been sick.  She developed fevers up to upwards of 103 Fahrenheit.  Compliant with her antiretroviral medication.   Interval Hx   No fevers over the past 36 hours. Headache has completely resolved with headache cocktail. Patient denies any complaints. She specifically denies fevers/chills, CP/SoB, GI symptoms, and dysuria.   Vitals   Vitals:   10/22/21 1554 10/22/21 1948 10/23/21 0535 10/23/21 0727  BP: 96/63 129/82 113/79 118/77  Pulse: 96 95 97 92  Resp: '17 18 18 18  '$ Temp: 98.3 F (36.8 C) 97.8 F (36.6 C) 98.3 F (36.8 C) 98.4 F (36.9 C)  TempSrc: Oral Oral Oral   SpO2: 100% 100% 99% 99%  Weight:      Height:         Body mass index is 38.84 kg/m.  Physical Exam   General: Lying comfortably in bed; in no acute distress.  HENT: Normal oropharynx and mucosa. Normal external appearance of ears and nose.  Neck: Supple, no pain or tenderness  CV: No peripheral edema.  Pulmonary: Symmetric Chest rise. Normal respiratory effort.  Ext: No cyanosis, edema, or deformity  Skin: No rash. Normal palpation of skin.    Neurologic Examination  Mental status/Cognition: Alert, oriented to self, place, month and year, good attention.  Speech/language: Fluent, comprehension intact Cranial nerves:   CN II Pupils equal and reactive to light, no VF deficits    CN III,IV,VI EOM intact, no gaze preference or deviation, no nystagmus    CN V normal sensation in V1, V2, and V3 segments bilaterally    CN VII no asymmetry, no nasolabial fold flattening    CN VIII normal hearing to speech    CN IX & X normal  palatal elevation, no uvular deviation    CN XI 5/5 head turn and 5/5 shoulder shrug bilaterally    CN XII midline tongue protrusion    Motor:  Mvmt Root Nerve  Muscle Right Left Comments  SA C5/6 Ax Deltoid 5/5 5/5   EF C5/6 Mc Biceps 5/5 5/5   EE C6/7/8 Rad Triceps 5/5 5/5   WF C6/7 Med FCR     WE C7/8 PIN ECU     F Ab C8/T1 U ADM/FDI     HF L1/2/3 Fem Illopsoas 5/5 5/5   KE L2/3/4 Fem Quad 5/5 5/5   DF L4/5 D Peron Tib Ant     PF S1/2 Tibial Grc/Sol      Reflexes:  Right Left Comments  Pectoralis      Biceps (C5/6)     Brachioradialis (C5/6)      Triceps (C6/7) 2+ 2+    Patellar (L3/4) 2+ 2+    Achilles (S1)      Hoffman      Plantar     Jaw jerk    Sensation:  Light touch intact   Pin prick    Temperature    Vibration   Proprioception    Coordination/Complex Motor:  - Finger to Nose intact - Heel to shin intact - Rapid alternating movement intact - Gait: deferred  Labs   Basic Metabolic Panel:  Lab Results  Component Value Date   NA 135 10/23/2021   K 3.9 10/23/2021   CO2 26 10/23/2021   GLUCOSE 294 (H) 10/23/2021   BUN 6 10/23/2021   CREATININE 0.74 10/23/2021   CALCIUM 8.3 (L) 10/23/2021   GFRNONAA >60 10/23/2021   GFRAA 127 11/05/2020   HbA1c:  Lab Results  Component Value Date   HGBA1C 14.0 (A) 10/17/2021   HGBA1C 14.0 10/17/2021   HGBA1C 14.0 (A) 10/17/2021   HGBA1C 14.0 (A) 10/17/2021   LDL:  Lab Results  Component Value Date   LDLCALC 73 11/05/2020   Urine Drug Screen:     Component Value Date/Time   LABOPIA NONE DETECTED 10/20/2021 1100   COCAINSCRNUR NONE DETECTED 10/20/2021 1100   LABBENZ NONE DETECTED 10/20/2021 1100   AMPHETMU NONE DETECTED 10/20/2021 1100   THCU NONE DETECTED 10/20/2021 1100   LABBARB NONE DETECTED 10/20/2021 1100    Alcohol Level No results found for: ETH No results found for: PHENYTOIN, ZONISAMIDE, LAMOTRIGINE, LEVETIRACETA No results found for: PHENYTOIN, PHENOBARB, VALPROATE, CBMZ  Imaging and  Diagnostic studies  Results for orders placed during the hospital encounter of 10/20/21  ECHOCARDIOGRAM COMPLETE  Narrative ECHOCARDIOGRAM REPORT    Patient Name:   Beverly Johnson Date of Exam: 10/22/2021 Medical Rec #:  [id number]       Height:       67.0 in Accession #:    [id number]      Weight:       248.0 lb Date of Birth:  [birth year]       BSA:          2.216 m Patient Age:    6 years        BP:           108/74 mmHg Patient Gender: F               HR:           91 bpm. Exam Location:  Inpatient  Procedure: 2D Echo, Cardiac Doppler and Color Doppler  Indications:    Fever  History:        Patient has no prior history of Echocardiogram examinations. Risk Factors:Diabetes. HIV. Sepsis.  Sonographer:    Clayton Lefort RDCS (AE) Referring Phys: [place] Comments: Patient is morbidly obese. Image acquisition challenging due to patient body habitus. IMPRESSIONS   1. Left ventricular ejection fraction, by estimation, is 60 to 65%. The left ventricle has normal function. The left ventricle has no regional wall motion abnormalities. Left ventricular diastolic parameters were normal. 2. Right ventricular systolic function is normal. The right ventricular size is normal. 3. The mitral valve is normal in structure. Trivial mitral valve regurgitation. No evidence of mitral stenosis. 4. The aortic valve is grossly normal. There is mild calcification of the aortic valve. Aortic valve regurgitation is not visualized. No aortic stenosis is present. 5. The inferior vena cava is normal in size with greater than 50% respiratory variability, suggesting right atrial pressure of 3 mmHg.  Comparison(s): No prior Echocardiogram.  Conclusion(s)/Recommendation(s): Normal biventricular function without evidence of hemodynamically significant valvular heart disease.  FINDINGS Left Ventricle: Left ventricular ejection fraction, by estimation, is 60 to 65%. The left ventricle  has normal function. The left ventricle has no regional wall motion abnormalities. The left ventricular internal cavity size was normal in size. There is no left ventricular hypertrophy. Left ventricular diastolic parameters were normal.  Right Ventricle: The right ventricular size  is normal. Right vetricular wall thickness was not well visualized. Right ventricular systolic function is normal.  Left Atrium: Left atrial size was normal in size.  Right Atrium: Right atrial size was normal in size.  Pericardium: There is no evidence of pericardial effusion.  Mitral Valve: The mitral valve is normal in structure. Trivial mitral valve regurgitation. No evidence of mitral valve stenosis. MV peak gradient, 4.1 mmHg. The mean mitral valve gradient is 2.0 mmHg.  Tricuspid Valve: The tricuspid valve is grossly normal. Tricuspid valve regurgitation is trivial. No evidence of tricuspid stenosis.  Aortic Valve: The aortic valve is grossly normal. There is mild calcification of the aortic valve. Aortic valve regurgitation is not visualized. No aortic stenosis is present. Aortic valve mean gradient measures 5.0 mmHg. Aortic valve peak gradient measures 10.5 mmHg. Aortic valve area, by VTI measures 2.37 cm.  Pulmonic Valve: The pulmonic valve was not well visualized. Pulmonic valve regurgitation is not visualized. No evidence of pulmonic stenosis.  Aorta: The aortic root, ascending aorta and aortic arch are all structurally normal, with no evidence of dilitation or obstruction.  Venous: The inferior vena cava is normal in size with greater than 50% respiratory variability, suggesting right atrial pressure of 3 mmHg.  IAS/Shunts: The atrial septum is grossly normal.   LEFT VENTRICLE PLAX 2D LVIDd:         3.80 cm   Diastology LVIDs:         2.80 cm   LV e' medial:    8.85 cm/s LV PW:         1.00 cm   LV E/e' medial:  10.9 LV IVS:        0.90 cm   LV e' lateral:   14.00 cm/s LVOT diam:     1.80 cm    LV E/e' lateral: 6.9 LV SV:         61 LV SV Index:   28 LVOT Area:     2.54 cm   RIGHT VENTRICLE             IVC RV Basal diam:  3.20 cm     IVC diam: 1.70 cm RV S prime:     13.40 cm/s TAPSE (M-mode): 2.1 cm  LEFT ATRIUM             Index        RIGHT ATRIUM           Index LA diam:        3.00 cm 1.35 cm/m   RA Area:     15.20 cm LA Vol (A2C):   26.3 ml 11.87 ml/m  RA Volume:   38.10 ml  17.19 ml/m LA Vol (A4C):   30.4 ml 13.72 ml/m LA Biplane Vol: 28.7 ml 12.95 ml/m AORTIC VALVE AV Area (Vmax):    2.25 cm AV Area (Vmean):   2.19 cm AV Area (VTI):     2.37 cm AV Vmax:           162.00 cm/s AV Vmean:          107.000 cm/s AV VTI:            0.259 m AV Peak Grad:      10.5 mmHg AV Mean Grad:      5.0 mmHg LVOT Vmax:         143.00 cm/s LVOT Vmean:        92.200 cm/s LVOT VTI:  0.241 m LVOT/AV VTI ratio: 0.93  AORTA Ao Root diam: 2.60 cm Ao Asc diam:  2.40 cm  MITRAL VALVE MV Area (PHT): 3.83 cm    SHUNTS MV Area VTI:   2.52 cm    Systemic VTI:  0.24 m MV Peak grad:  4.1 mmHg    Systemic Diam: 1.80 cm MV Mean grad:  2.0 mmHg MV Vmax:       1.01 m/s MV Vmean:      67.0 cm/s MV Decel Time: 198 msec MV E velocity: 96.40 cm/s MV A velocity: 90.70 cm/s MV E/A ratio:  1.06  Buford Dresser MD Electronically signed by Buford Dresser MD Signature Date/Time: 10/22/2021/4:07:00 PM    Final   Impression   Beverly Johnson is a 46 y.o. female past medical history of diabetes, well-controlled HIV, came in for evaluation of off-and-on fevers.  She had been feeling poorly with nausea vomiting and diarrhea prior to presentation and felt that she had been sick.  She developed fevers up to upwards of 103 Fahrenheit.  Compliant with her antiretroviral medication.  CT abdomen pelvis notable for evidence of pyelonephritis with a corresponding urine culture positive for E. coli.  Patient being treated appropriately with cefazolin and has not had a  fever over the past 36 hours.  She complained of a severe headache and we were consulted.  MRI brain was unremarkable.  Her headache has now resolved with 1 dose of a migraine cocktail.    Feel that her headache and other symptoms were likely secondary to the pyelonephritis, which is currently resolving with antibiotics.   May have ocular migraines, which responded well to cocktail of meds yesterday.  Given that she has had multiple headaches a week-would  recommend preventive therapy.  See details below  Do not feel she needs LP for evaluation of CSF for opportunistic infection and her headache description does not sound consistent with IIH T.  Recommendations   -Start and continue Topamax outpatient for HA prophylaxis   -25 mg QHS for 1 wk  -25 mg BID for 1 wk  -25 mg AM / 50 mg QHS for 1 wk  -50 mg BID thereafter -Outpatient follow-up with Dr. Lavell Anchors at [facility] We will sign off at this time. Please re-consult if the clinical picture requires.   Corky Sox, MD PGY-1  Attending Neurohospitalist Addendum Patient seen and examined with APP/Resident. Agree with the history and physical as documented above. Agree with the plan as documented, which I helped formulate. I have independently reviewed the chart, obtained history, review of systems and examined the patient.I have personally reviewed pertinent head/neck/spine imaging (CT/MRI). Plan d/w hospitalist Dr Sloan Leiter Please feel free to call with any questions.  -- Amie Portland, MD Neurologist [facility] Pager: [phone]

## 2021-10-23 NOTE — Discharge Summary (Signed)
PATIENT DETAILS Name: Beverly Johnson Age: 46 y.o. Sex: female Date of Birth: May 29, 1976 MRN: [id number]. Admitting Physician: Karmen Bongo, MD XTA:VWPV, Diona Foley, NP  Admit Date: 10/20/2021 Discharge date: 10/23/2021  Recommendations for Outpatient Follow-up:  Follow up with PCP in 1-2 weeks Please obtain CMP/CBC in one week Please ensure follow-up with infectious disease, neurology If patient's fever/headache/fatigue reoccurs-after completion of antibiotic course-we will likely need further work-up.  Admitted From:  Home  Disposition: Home   Discharge Condition: good  CODE STATUS:   Code Status: Full Code   Diet recommendation:  Diet Order             Diet - low sodium heart healthy           Diet Carb Modified           Diet Carb Modified Fluid consistency: Thin; Room service appropriate? Yes  Diet effective now                    Brief Summary: Patient is a 46 y.o.  female with history of insulin-dependent DM-2, HIV-presenting with 56-monthhistory of intermittent fever, dry cough, vomiting and upper abdominal pain.  She was subsequently admitted to the hospitalist service for further evaluation and treatment.  See below for further details.    Significant [facility]: [facility]/headache/vomiting/upper abdominal pain/dry cough   Significant imaging studies: 3/6>> CXR: No PNA 3/7>> CT chest/abdomen: Possible left acute pyelonephritis. 3/8>> MRI brain: No acute abnormalities   Significant microbiology data: 3/6>> COVID/influenza PCR: Negative 3/6>> blood culture: Negative 3/6>> urine culture: E. coli   Procedures: None   Consults:  Infectious disease Neurology  Brief Hospital Course: Fever/headaches/vomiting/fatigue: Unclear etiology-it is highly unlikely that pyelonephritis is causing the symptoms for the past 2 months-it may have caused her to have worsening symptoms for the  past several days before she presented to the hospital.  She was treated with Rocephin-has been afebrile since then.  MRI of the brain did not show any acute abnormalities-evaluated by neurology-given migraine cocktail with complete resolution of headache.  She feels much better and is actually requesting discharge home.  Thought to probably have underlying ocular migraine.  Unclear whether all of these account for her issues for the past several months-however she feels better-has been afebrile-no vomiting-headache has been relieved-discussed with both ID/neurology-plans are to continue outpatient monitoring-she will be on Keflex for a few more days, she will be placed on prophylactic Topamax.  If she continues to have recurrence of symptoms after completing antibiotic course-she may need further work-up.  ID will arrange follow-up in the clinic in the next few weeks.  Acute pyelonephritis: Urine cultures positive for E. coli-see above.  Probable ocular migraine: See above   HIV: Continue antiretrovirals.   Uncontrolled DM-2 with hyperglycemia (A1c 14.0 on 3/3): CBGs continue to be on the higher side-increase Semglee to 26 units on discharge.  Resume her usual outpatient oral medications.  Follow-up with her endocrinologist for further optimization.   Hyponatremia: Probably due to mild dehydration in setting of persistent fever.  Slowly improving-continue supportive care.    Hypokalemia: Repleted.   Microcytic anemia: Reported history of fibroids-has history of intermittent menorrhagia-appears to have iron deficiency.  Continue iron supplementation  Mild leukopenia/thrombocytopenia: Unclear significance at this point-if no other cause of fever/headache/vomiting/fatigue is apparent-May need a bone marrow biopsy at some point.  Thankfully thrombocytopenia has resolved.  Outpatient PCP/ID MD to  continue close monitoring.   HLD: Resume statins    Morbid obesity: Estimated body mass index is 38.84  kg/m as calculated from the following:   Height as of this encounter: 5' 7"  (1.702 m).   Weight as of this encounter: 112.5 kg.    Discharge Diagnoses:  Principal Problem:   Fever Active Problems:   Human immunodeficiency virus (HIV) disease ([place])   Type 2 diabetes mellitus without complications (HCC)   Class 2 obesity due to excess calories with body mass index (BMI) of 38.0 to 38.9 in adult   Sepsis [facility])   Discharge Instructions:  Activity:  As tolerated   Discharge Instructions     Ambulatory referral to Neurology   Complete by: As directed    An appointment is requested in approximately: 4 weeks   Call MD for:  persistant nausea and vomiting   Complete by: As directed    Call MD for:  severe uncontrolled pain   Complete by: As directed    Call MD for:  temperature >100.4   Complete by: As directed    Diet - low sodium heart healthy   Complete by: As directed    Diet Carb Modified   Complete by: As directed    Discharge instructions   Complete by: As directed    FollowFollow with Primary MD  Vevelyn Francois, NP in 1-2 weeks  You will get a call from infectious disease office for a posthospital discharge visit-if you do not hear from them-please give them a call.  You will get a call for a follow-up visit from [facility] neurology-if you do not hear from them-please give them a call.  Please get a complete blood count and chemistry panel checked by your Primary MD at your next visit, and again as instructed by your Primary MD.  Get Medicines reviewed and adjusted: Please take all your medications with you for your next visit with your Primary MD  Laboratory/radiological data: Please request your Primary MD to go over all hospital tests and procedure/radiological results at the follow up, please ask your Primary MD to get all Hospital records sent to his/her office.  In some cases, they will be blood work, cultures and biopsy results pending at the time of your  discharge. Please request that your primary care M.D. follows up on these results.  Also Note the following: If you experience worsening of your admission symptoms, develop shortness of breath, life threatening emergency, suicidal or homicidal thoughts you must seek medical attention immediately by calling 911 or calling your MD immediately  if symptoms less severe.  You must read complete instructions/literature along with all the possible adverse reactions/side effects for all the Medicines you take and that have been prescribed to you. Take any new Medicines after you have completely understood and accpet all the possible adverse reactions/side effects.   Do not drive when taking Pain medications or sleeping medications (Benzodaizepines)  Do not take more than prescribed Pain, Sleep and Anxiety Medications. It is not advisable to combine anxiety,sleep and pain medications without talking with your primary care practitioner  Special Instructions: If you have smoked or chewed Tobacco  in the last 2 yrs please stop smoking, stop any regular Alcohol  and or any Recreational drug use.  Wear Seat belts while driving.  Please note: You were cared for by a hospitalist during your hospital stay. Once you are discharged, your primary care physician will handle any further medical issues. Please note that NO REFILLS  for any discharge medications will be authorized once you are discharged, as it is imperative that you return to your primary care physician (or establish a relationship with a primary care physician if you do not have one) for your post hospital discharge needs so that they can reassess your need for medications and monitor your lab values.   Increase activity slowly   Complete by: As directed       Allergies as of 10/23/2021   No Known Allergies      Medication List     TAKE these medications    Accu-Chek Softclix Lancets lancets 1 each 3 (three) times daily.   cephALEXin 500  MG capsule Commonly known as: KEFLEX Take 1 capsule (500 mg total) by mouth 3 (three) times daily for 5 days.   Genvoya 150-150-200-10 MG Tabs tablet Generic drug: elvitegravir-cobicistat-emtricitabine-tenofovir Take 1 tablet by mouth daily with breakfast.   glipiZIDE 10 MG tablet Commonly known as: GLUCOTROL Take 1 tablet (10 mg total) by mouth 2 (two) times daily before a meal.   glucose blood test strip Check your sugar in the morning before you eat breakfast, and one hour after a meal.   Accu-Chek Guide test strip Generic drug: glucose blood Use as instructed   glucose monitoring kit monitoring kit 1 each by Does not apply route daily. Check glucose once in the morning before breakfast and 1 hour after a meal   insulin glargine 100 UNIT/ML Solostar Pen Commonly known as: LANTUS Inject 36 Units into the skin daily. What changed: how much to take   metFORMIN 500 MG tablet Commonly known as: GLUCOPHAGE Take 2 tablets (1,000 mg total) by mouth 2 (two) times daily with a meal. What changed: how much to take   methocarbamol 500 MG tablet Commonly known as: ROBAXIN Take 1 tablet (500 mg total) by mouth at bedtime as needed for muscle spasms.   rosuvastatin 10 MG tablet Commonly known as: Crestor Take 1 tablet (10 mg total) by mouth daily.   sitaGLIPtin 100 MG tablet Commonly known as: JANUVIA Take 1 tablet (100 mg total) by mouth daily.   topiramate 25 MG tablet Commonly known as: Topamax Take 25 mg p.o. nightly for 1 week, then 25 mg p.o. twice daily for 1 week, then 25 mg p.o. in the morning and 50 mg p.o. in the evening for 1 week, then 50 mg twice daily and stay on it until seen by outpatient neurologist.   traMADol 50 MG tablet Commonly known as: ULTRAM Take 1 tablet (50 mg total) by mouth every 6 (six) hours as needed. What changed: reasons to take this        Follow-up Information     Vevelyn Francois, NP. Schedule an appointment as soon as possible for a  visit in 1 week(s).   Specialty: Adult Health Nurse Practitioner Contact information: [address] [place] [address] [phone]         Thayer Headings, MD Follow up.   Specialty: Infectious Diseases Why: Office will call with date/time, If you dont hear from them,please give them a call Contact information: [address] [address] [phone]         Melvenia Beam, MD Follow up.   Specialty: Neurology Why: Office will call with date/time, If you dont hear from them,please give them a call Contact information: [place] [address] [phone]  No Known Allergies   Other Procedures/Studies: DG Chest 1 View  Result Date: 10/20/2021 CLINICAL DATA:  Weakness, cough EXAM: CHEST  1 VIEW COMPARISON:  01/22/2021 FINDINGS: Lung volumes are low. There is no focal consolidation or edema. No pleural effusion or pneumothorax. Normal heart size. IMPRESSION: No acute process in the chest. Electronically Signed   By: Macy Mis M.D.   On: 10/20/2021 09:15   MR BRAIN W WO CONTRAST  Result Date: 10/22/2021 CLINICAL DATA:  Headache, chronic, new features or increased frequency controlled HIV-with worsening Headaches x 2 months EXAM: MRI HEAD WITHOUT AND WITH CONTRAST TECHNIQUE: Multiplanar, multiecho pulse sequences of the brain and surrounding structures were obtained without and with intravenous contrast. CONTRAST:  74m GADAVIST GADOBUTROL 1 MMOL/ML IV SOLN COMPARISON:  None. FINDINGS: Mildly motion limited. Brain: No acute infarction, hemorrhage, hydrocephalus, extra-axial collection or mass lesion. A couple small T2/FLAIR hyperintensities in the white matter, nonspecific but considered within normal limits for patient age. No abnormal enhancement. Vascular: Major arterial flow voids are maintained. Skull and upper cervical spine: Normal marrow signal. Sinuses/Orbits: Clear sinuses.  Unremarkable orbits.  Other: No mastoid effusions. IMPRESSION: Normal brain MRI for age.  No evidence of acute abnormality. Electronically Signed   By: FMargaretha SheffieldM.D.   On: 10/22/2021 13:14   CT CHEST ABDOMEN PELVIS W CONTRAST  Result Date: 10/21/2021 CLINICAL DATA:  Sepsis.  Fever and vomiting.  Generalized body ache. EXAM: CT CHEST, ABDOMEN, AND PELVIS WITH CONTRAST TECHNIQUE: Multidetector CT imaging of the chest, abdomen and pelvis was performed following the standard protocol during bolus administration of intravenous contrast. RADIATION DOSE REDUCTION: This exam was performed according to the departmental dose-optimization program which includes automated exposure control, adjustment of the mA and/or kV according to patient size and/or use of iterative reconstruction technique. CONTRAST:  832mOMNIPAQUE IOHEXOL 300 MG/ML  SOLN COMPARISON:  One-view chest x-ray 10/20/2021 FINDINGS: CT CHEST FINDINGS Cardiovascular: Heart size is normal. The aorta and great vessel origins are within normal limits. Pulmonary arteries are unremarkable. No significant pericardial effusion is present. Mediastinum/Nodes: No enlarged mediastinal, hilar, or axillary lymph nodes. Thyroid gland, trachea, and esophagus demonstrate no significant findings. Lungs/Pleura: Minimal dependent atelectasis is present at both lung bases. No nodule or mass lesion is present. No significant pleural effusion or pneumothorax is present. Musculoskeletal: No chest wall mass or suspicious bone lesions identified. CT ABDOMEN PELVIS FINDINGS Hepatobiliary: No focal liver abnormality is seen. Status post cholecystectomy. No biliary dilatation. Pancreas: Unremarkable. No pancreatic ductal dilatation or surrounding inflammatory changes. Spleen: Normal in size without focal abnormality. Adrenals/Urinary Tract: The adrenal glands are normal bilaterally. Right kidney a is within normal limits. No mass lesion, stone or obstruction is present. Multifocal areas of decreased  perfusion are present within the left renal parenchyma with surrounding inflammatory change. No discrete mass lesion is present. The largest area of decreased perfusion is in the posterolateral left kidney with marked surrounding inflammatory change. No stone or obstruction is present. The ureters are within normal limits bilaterally. The urinary bladder is normal. Stomach/Bowel: Stomach and duodenum are within normal limits. Small bowel is unremarkable. Terminal ileum is within normal limits. The appendix is visualized and normal. The ascending and transverse colon are within normal limits. The descending and sigmoid colon are normal. Vascular/Lymphatic: No significant vascular findings are present. No enlarged abdominal or pelvic lymph nodes. Reproductive: Fibroid at the fundus the uterus measures up to 7.5 cm. Adnexa are unremarkable. Other: No abdominal wall hernia or abnormality. No abdominopelvic  ascites. Musculoskeletal: No acute or significant osseous findings. IMPRESSION: 1. Multifocal areas of decreased perfusion within the left renal parenchyma with surrounding inflammatory change. Findings are consistent with acute pyelonephritis. No discrete mass lesion is present. 2. No other acute or focal abnormality to explain the patient's symptoms. 3. 7.5 cm uterine fibroid. 4. Status post cholecystectomy. Electronically Signed   By: San Morelle M.D.   On: 10/21/2021 11:46   ECHOCARDIOGRAM COMPLETE  Result Date: 10/22/2021    ECHOCARDIOGRAM REPORT   Patient Name:   Beverly Johnson Date of Exam: 10/22/2021 Medical Rec #:  [id number]       Height:       67.0 in Accession #:    [id number]      Weight:       248.0 lb Date of Birth:  July 03, 1976       BSA:          2.216 m Patient Age:    34 years        BP:           108/74 mmHg Patient Gender: F               HR:           91 bpm. Exam Location:  Inpatient Procedure: 2D Echo, Cardiac Doppler and Color Doppler Indications:    Fever  History:        Patient has  no prior history of Echocardiogram examinations.                 Risk Factors:Diabetes. HIV. Sepsis.  Sonographer:    Clayton Lefort RDCS (AE) Referring Phys: [place] Comments: Patient is morbidly obese. Image acquisition challenging due to patient body habitus. IMPRESSIONS  1. Left ventricular ejection fraction, by estimation, is 60 to 65%. The left ventricle has normal function. The left ventricle has no regional wall motion abnormalities. Left ventricular diastolic parameters were normal.  2. Right ventricular systolic function is normal. The right ventricular size is normal.  3. The mitral valve is normal in structure. Trivial mitral valve regurgitation. No evidence of mitral stenosis.  4. The aortic valve is grossly normal. There is mild calcification of the aortic valve. Aortic valve regurgitation is not visualized. No aortic stenosis is present.  5. The inferior vena cava is normal in size with greater than 50% respiratory variability, suggesting right atrial pressure of 3 mmHg. Comparison(s): No prior Echocardiogram. Conclusion(s)/Recommendation(s): Normal biventricular function without evidence of hemodynamically significant valvular heart disease. FINDINGS  Left Ventricle: Left ventricular ejection fraction, by estimation, is 60 to 65%. The left ventricle has normal function. The left ventricle has no regional wall motion abnormalities. The left ventricular internal cavity size was normal in size. There is  no left ventricular hypertrophy. Left ventricular diastolic parameters were normal. Right Ventricle: The right ventricular size is normal. Right vetricular wall thickness was not well visualized. Right ventricular systolic function is normal. Left Atrium: Left atrial size was normal in size. Right Atrium: Right atrial size was normal in size. Pericardium: There is no evidence of pericardial effusion. Mitral Valve: The mitral valve is normal in structure. Trivial mitral valve  regurgitation. No evidence of mitral valve stenosis. MV peak gradient, 4.1 mmHg. The mean mitral valve gradient is 2.0 mmHg. Tricuspid Valve: The tricuspid valve is grossly normal. Tricuspid valve regurgitation is trivial. No evidence of tricuspid stenosis. Aortic Valve: The aortic valve is grossly normal. There is mild calcification of the aortic  valve. Aortic valve regurgitation is not visualized. No aortic stenosis is present. Aortic valve mean gradient measures 5.0 mmHg. Aortic valve peak gradient measures 10.5 mmHg. Aortic valve area, by VTI measures 2.37 cm. Pulmonic Valve: The pulmonic valve was not well visualized. Pulmonic valve regurgitation is not visualized. No evidence of pulmonic stenosis. Aorta: The aortic root, ascending aorta and aortic arch are all structurally normal, with no evidence of dilitation or obstruction. Venous: The inferior vena cava is normal in size with greater than 50% respiratory variability, suggesting right atrial pressure of 3 mmHg. IAS/Shunts: The atrial septum is grossly normal.  LEFT VENTRICLE PLAX 2D LVIDd:         3.80 cm   Diastology LVIDs:         2.80 cm   LV e' medial:    8.85 cm/s LV PW:         1.00 cm   LV E/e' medial:  10.9 LV IVS:        0.90 cm   LV e' lateral:   14.00 cm/s LVOT diam:     1.80 cm   LV E/e' lateral: 6.9 LV SV:         61 LV SV Index:   28 LVOT Area:     2.54 cm  RIGHT VENTRICLE             IVC RV Basal diam:  3.20 cm     IVC diam: 1.70 cm RV S prime:     13.40 cm/s TAPSE (M-mode): 2.1 cm LEFT ATRIUM             Index        RIGHT ATRIUM           Index LA diam:        3.00 cm 1.35 cm/m   RA Area:     15.20 cm LA Vol (A2C):   26.3 ml 11.87 ml/m  RA Volume:   38.10 ml  17.19 ml/m LA Vol (A4C):   30.4 ml 13.72 ml/m LA Biplane Vol: 28.7 ml 12.95 ml/m  AORTIC VALVE AV Area (Vmax):    2.25 cm AV Area (Vmean):   2.19 cm AV Area (VTI):     2.37 cm AV Vmax:           162.00 cm/s AV Vmean:          107.000 cm/s AV VTI:            0.259 m AV Peak  Grad:      10.5 mmHg AV Mean Grad:      5.0 mmHg LVOT Vmax:         143.00 cm/s LVOT Vmean:        92.200 cm/s LVOT VTI:          0.241 m LVOT/AV VTI ratio: 0.93  AORTA Ao Root diam: 2.60 cm Ao Asc diam:  2.40 cm MITRAL VALVE MV Area (PHT): 3.83 cm    SHUNTS MV Area VTI:   2.52 cm    Systemic VTI:  0.24 m MV Peak grad:  4.1 mmHg    Systemic Diam: 1.80 cm MV Mean grad:  2.0 mmHg MV Vmax:       1.01 m/s MV Vmean:      67.0 cm/s MV Decel Time: 198 msec MV E velocity: 96.40 cm/s MV A velocity: 90.70 cm/s MV E/A ratio:  1.06 Buford Dresser MD Electronically signed by Buford Dresser MD Signature Date/Time: 10/22/2021/4:07:00 PM  Final      TODAY-DAY OF DISCHARGE:  Subjective:   [place] Siren today has no headache,no chest abdominal pain,no new weakness tingling or numbness, feels much better wants to go home today.  Objective:   Blood pressure 118/77, pulse 92, temperature 98.4 F (36.9 C), resp. rate 18, height 5' 7"  (1.702 m), weight 112.5 kg, SpO2 99 %.  Intake/Output Summary (Last 24 hours) at 10/23/2021 1027 Last data filed at 10/23/2021 0411 Gross per 24 hour  Intake 720 ml  Output --  Net 720 ml   Filed Weights   10/20/21 0837  Weight: 112.5 kg    Exam: Awake Alert, Oriented *3, No new F.N deficits, Normal affect [place].AT,PERRAL Supple Neck,No JVD, No cervical lymphadenopathy appriciated.  Symmetrical Chest wall movement, Good air movement bilaterally, CTAB RRR,No Gallops,Rubs or new Murmurs, No Parasternal Heave +ve B.Sounds, Abd Soft, Non tender, No organomegaly appriciated, No rebound -guarding or rigidity. No Cyanosis, Clubbing or edema, No new Rash or bruise   PERTINENT RADIOLOGIC STUDIES: MR BRAIN W WO CONTRAST  Result Date: 10/22/2021 CLINICAL DATA:  Headache, chronic, new features or increased frequency controlled HIV-with worsening Headaches x 2 months EXAM: MRI HEAD WITHOUT AND WITH CONTRAST TECHNIQUE: Multiplanar, multiecho pulse sequences of the brain and  surrounding structures were obtained without and with intravenous contrast. CONTRAST:  67m GADAVIST GADOBUTROL 1 MMOL/ML IV SOLN COMPARISON:  None. FINDINGS: Mildly motion limited. Brain: No acute infarction, hemorrhage, hydrocephalus, extra-axial collection or mass lesion. A couple small T2/FLAIR hyperintensities in the white matter, nonspecific but considered within normal limits for patient age. No abnormal enhancement. Vascular: Major arterial flow voids are maintained. Skull and upper cervical spine: Normal marrow signal. Sinuses/Orbits: Clear sinuses.  Unremarkable orbits. Other: No mastoid effusions. IMPRESSION: Normal brain MRI for age.  No evidence of acute abnormality. Electronically Signed   By: FMargaretha SheffieldM.D.   On: 10/22/2021 13:14   CT CHEST ABDOMEN PELVIS W CONTRAST  Result Date: 10/21/2021 CLINICAL DATA:  Sepsis.  Fever and vomiting.  Generalized body ache. EXAM: CT CHEST, ABDOMEN, AND PELVIS WITH CONTRAST TECHNIQUE: Multidetector CT imaging of the chest, abdomen and pelvis was performed following the standard protocol during bolus administration of intravenous contrast. RADIATION DOSE REDUCTION: This exam was performed according to the departmental dose-optimization program which includes automated exposure control, adjustment of the mA and/or kV according to patient size and/or use of iterative reconstruction technique. CONTRAST:  875mOMNIPAQUE IOHEXOL 300 MG/ML  SOLN COMPARISON:  One-view chest x-ray 10/20/2021 FINDINGS: CT CHEST FINDINGS Cardiovascular: Heart size is normal. The aorta and great vessel origins are within normal limits. Pulmonary arteries are unremarkable. No significant pericardial effusion is present. Mediastinum/Nodes: No enlarged mediastinal, hilar, or axillary lymph nodes. Thyroid gland, trachea, and esophagus demonstrate no significant findings. Lungs/Pleura: Minimal dependent atelectasis is present at both lung bases. No nodule or mass lesion is present. No  significant pleural effusion or pneumothorax is present. Musculoskeletal: No chest wall mass or suspicious bone lesions identified. CT ABDOMEN PELVIS FINDINGS Hepatobiliary: No focal liver abnormality is seen. Status post cholecystectomy. No biliary dilatation. Pancreas: Unremarkable. No pancreatic ductal dilatation or surrounding inflammatory changes. Spleen: Normal in size without focal abnormality. Adrenals/Urinary Tract: The adrenal glands are normal bilaterally. Right kidney a is within normal limits. No mass lesion, stone or obstruction is present. Multifocal areas of decreased perfusion are present within the left renal parenchyma with surrounding inflammatory change. No discrete mass lesion is present. The largest area of decreased perfusion is in the posterolateral  left kidney with marked surrounding inflammatory change. No stone or obstruction is present. The ureters are within normal limits bilaterally. The urinary bladder is normal. Stomach/Bowel: Stomach and duodenum are within normal limits. Small bowel is unremarkable. Terminal ileum is within normal limits. The appendix is visualized and normal. The ascending and transverse colon are within normal limits. The descending and sigmoid colon are normal. Vascular/Lymphatic: No significant vascular findings are present. No enlarged abdominal or pelvic lymph nodes. Reproductive: Fibroid at the fundus the uterus measures up to 7.5 cm. Adnexa are unremarkable. Other: No abdominal wall hernia or abnormality. No abdominopelvic ascites. Musculoskeletal: No acute or significant osseous findings. IMPRESSION: 1. Multifocal areas of decreased perfusion within the left renal parenchyma with surrounding inflammatory change. Findings are consistent with acute pyelonephritis. No discrete mass lesion is present. 2. No other acute or focal abnormality to explain the patient's symptoms. 3. 7.5 cm uterine fibroid. 4. Status post cholecystectomy. Electronically Signed   By:  San Morelle M.D.   On: 10/21/2021 11:46   ECHOCARDIOGRAM COMPLETE  Result Date: 10/22/2021    ECHOCARDIOGRAM REPORT   Patient Name:   Beverly Johnson Date of Exam: 10/22/2021 Medical Rec #:  [id number]       Height:       67.0 in Accession #:    [id number]      Weight:       248.0 lb Date of Birth:  [birth year]       BSA:          2.216 m Patient Age:    47 years        BP:           108/74 mmHg Patient Gender: F               HR:           91 bpm. Exam Location:  Inpatient Procedure: 2D Echo, Cardiac Doppler and Color Doppler Indications:    Fever  History:        Patient has no prior history of Echocardiogram examinations.                 Risk Factors:Diabetes. HIV. Sepsis.  Sonographer:    Clayton Lefort RDCS (AE) Referring Phys: [place] Comments: Patient is morbidly obese. Image acquisition challenging due to patient body habitus. IMPRESSIONS  1. Left ventricular ejection fraction, by estimation, is 60 to 65%. The left ventricle has normal function. The left ventricle has no regional wall motion abnormalities. Left ventricular diastolic parameters were normal.  2. Right ventricular systolic function is normal. The right ventricular size is normal.  3. The mitral valve is normal in structure. Trivial mitral valve regurgitation. No evidence of mitral stenosis.  4. The aortic valve is grossly normal. There is mild calcification of the aortic valve. Aortic valve regurgitation is not visualized. No aortic stenosis is present.  5. The inferior vena cava is normal in size with greater than 50% respiratory variability, suggesting right atrial pressure of 3 mmHg. Comparison(s): No prior Echocardiogram. Conclusion(s)/Recommendation(s): Normal biventricular function without evidence of hemodynamically significant valvular heart disease. FINDINGS  Left Ventricle: Left ventricular ejection fraction, by estimation, is 60 to 65%. The left ventricle has normal function. The left ventricle has no  regional wall motion abnormalities. The left ventricular internal cavity size was normal in size. There is  no left ventricular hypertrophy. Left ventricular diastolic parameters were normal. Right Ventricle: The right ventricular size is normal. Right vetricular  wall thickness was not well visualized. Right ventricular systolic function is normal. Left Atrium: Left atrial size was normal in size. Right Atrium: Right atrial size was normal in size. Pericardium: There is no evidence of pericardial effusion. Mitral Valve: The mitral valve is normal in structure. Trivial mitral valve regurgitation. No evidence of mitral valve stenosis. MV peak gradient, 4.1 mmHg. The mean mitral valve gradient is 2.0 mmHg. Tricuspid Valve: The tricuspid valve is grossly normal. Tricuspid valve regurgitation is trivial. No evidence of tricuspid stenosis. Aortic Valve: The aortic valve is grossly normal. There is mild calcification of the aortic valve. Aortic valve regurgitation is not visualized. No aortic stenosis is present. Aortic valve mean gradient measures 5.0 mmHg. Aortic valve peak gradient measures 10.5 mmHg. Aortic valve area, by VTI measures 2.37 cm. Pulmonic Valve: The pulmonic valve was not well visualized. Pulmonic valve regurgitation is not visualized. No evidence of pulmonic stenosis. Aorta: The aortic root, ascending aorta and aortic arch are all structurally normal, with no evidence of dilitation or obstruction. Venous: The inferior vena cava is normal in size with greater than 50% respiratory variability, suggesting right atrial pressure of 3 mmHg. IAS/Shunts: The atrial septum is grossly normal.  LEFT VENTRICLE PLAX 2D LVIDd:         3.80 cm   Diastology LVIDs:         2.80 cm   LV e' medial:    8.85 cm/s LV PW:         1.00 cm   LV E/e' medial:  10.9 LV IVS:        0.90 cm   LV e' lateral:   14.00 cm/s LVOT diam:     1.80 cm   LV E/e' lateral: 6.9 LV SV:         61 LV SV Index:   28 LVOT Area:     2.54 cm  RIGHT  VENTRICLE             IVC RV Basal diam:  3.20 cm     IVC diam: 1.70 cm RV S prime:     13.40 cm/s TAPSE (M-mode): 2.1 cm LEFT ATRIUM             Index        RIGHT ATRIUM           Index LA diam:        3.00 cm 1.35 cm/m   RA Area:     15.20 cm LA Vol (A2C):   26.3 ml 11.87 ml/m  RA Volume:   38.10 ml  17.19 ml/m LA Vol (A4C):   30.4 ml 13.72 ml/m LA Biplane Vol: 28.7 ml 12.95 ml/m  AORTIC VALVE AV Area (Vmax):    2.25 cm AV Area (Vmean):   2.19 cm AV Area (VTI):     2.37 cm AV Vmax:           162.00 cm/s AV Vmean:          107.000 cm/s AV VTI:            0.259 m AV Peak Grad:      10.5 mmHg AV Mean Grad:      5.0 mmHg LVOT Vmax:         143.00 cm/s LVOT Vmean:        92.200 cm/s LVOT VTI:          0.241 m LVOT/AV VTI ratio: 0.93  AORTA Ao Root diam: 2.60 cm Ao Asc diam:  2.40 cm MITRAL VALVE MV Area (PHT): 3.83 cm    SHUNTS MV Area VTI:   2.52 cm    Systemic VTI:  0.24 m MV Peak grad:  4.1 mmHg    Systemic Diam: 1.80 cm MV Mean grad:  2.0 mmHg MV Vmax:       1.01 m/s MV Vmean:      67.0 cm/s MV Decel Time: 198 msec MV E velocity: 96.40 cm/s MV A velocity: 90.70 cm/s MV E/A ratio:  1.06 Buford Dresser MD Electronically signed by Buford Dresser MD Signature Date/Time: 10/22/2021/4:07:00 PM    Final      PERTINENT LAB RESULTS: CBC: Recent Labs    10/22/21 0246 10/23/21 0225  WBC 2.7* 2.8*  HGB 8.8* 8.6*  HCT 29.2* 28.2*  PLT 149* 156   CMET CMP     Component Value Date/Time   NA 135 10/23/2021 0225   NA 131 (L) 01/22/2017 1535   K 3.9 10/23/2021 0225   CL 103 10/23/2021 0225   CO2 26 10/23/2021 0225   GLUCOSE 294 (H) 10/23/2021 0225   BUN 6 10/23/2021 0225   BUN 8 01/22/2017 1535   CREATININE 0.74 10/23/2021 0225   CREATININE 0.64 11/05/2020 0957   CALCIUM 8.3 (L) 10/23/2021 0225   PROT 6.3 (L) 10/23/2021 0225   PROT 7.4 01/22/2017 1535   ALBUMIN 2.0 (L) 10/23/2021 0225   ALBUMIN 4.0 01/22/2017 1535   AST 24 10/23/2021 0225   ALT 15 10/23/2021 0225   ALKPHOS  135 (H) 10/23/2021 0225   BILITOT 0.3 10/23/2021 0225   BILITOT 0.3 01/22/2017 1535   GFRNONAA >60 10/23/2021 0225   GFRNONAA 109 11/05/2020 0957   GFRAA 127 11/05/2020 0957    GFR Estimated Creatinine Clearance: 117.4 mL/min (by C-G formula based on SCr of 0.74 mg/dL). No results for input(s): LIPASE, AMYLASE in the last 72 hours. No results for input(s): CKTOTAL, CKMB, CKMBINDEX, TROPONINI in the last 72 hours. Invalid input(s): POCBNP No results for input(s): DDIMER in the last 72 hours. No results for input(s): HGBA1C in the last 72 hours. No results for input(s): CHOL, HDL, LDLCALC, TRIG, CHOLHDL, LDLDIRECT in the last 72 hours. No results for input(s): TSH, T4TOTAL, T3FREE, THYROIDAB in the last 72 hours.  Invalid input(s): FREET3 Recent Labs    10/22/21 0246 10/23/21 0225  VITAMINB12 790  --   FOLATE 18.0  --   FERRITIN 34  --   TIBC 266  --   IRON 17*  --   RETICCTPCT 2.5 0.7   Coags: No results for input(s): INR in the last 72 hours.  Invalid input(s): PT Microbiology: Recent Results (from the past 240 hour(s))  Resp Panel by RT-PCR (Flu A&B, Covid) Nasopharyngeal Swab     Status: None   Collection Time: 10/20/21  8:55 AM   Specimen: Nasopharyngeal Swab; Nasopharyngeal(NP) swabs in vial transport medium  Result Value Ref Range Status   SARS Coronavirus 2 by RT PCR NEGATIVE NEGATIVE Final    Comment: (NOTE) SARS-CoV-2 target nucleic acids are NOT DETECTED.  The SARS-CoV-2 RNA is generally detectable in upper respiratory specimens during the acute phase of infection. The lowest concentration of SARS-CoV-2 viral copies this assay can detect is 138 copies/mL. A negative result does not preclude SARS-Cov-2 infection and should not be used as the sole basis for treatment or other patient management decisions. A negative result may occur with  improper specimen collection/handling, submission of specimen other than nasopharyngeal swab, presence of viral  mutation(s) within  the areas targeted by this assay, and inadequate number of viral copies(<138 copies/mL). A negative result must be combined with clinical observations, patient history, and epidemiological information. The expected result is Negative.  Fact Sheet for Patients:  EntrepreneurPulse.com.au  Fact Sheet for Healthcare Providers:  IncredibleEmployment.be  This test is no t yet approved or cleared by the [place] FDA and  has been authorized for detection and/or diagnosis of SARS-CoV-2 by FDA under an Emergency Use Authorization (EUA). This EUA will remain  in effect (meaning this test can be used) for the duration of the COVID-19 declaration under Section 564(b)(1) of the Act, 21 U.S.C.section 360bbb-3(b)(1), unless the authorization is terminated  or revoked sooner.       Influenza A by PCR NEGATIVE NEGATIVE Final   Influenza B by PCR NEGATIVE NEGATIVE Final    Comment: (NOTE) The Xpert Xpress SARS-CoV-2/FLU/RSV plus assay is intended as an aid in the diagnosis of influenza from Nasopharyngeal swab specimens and should not be used as a sole basis for treatment. Nasal washings and aspirates are unacceptable for Xpert Xpress SARS-CoV-2/FLU/RSV testing.  Fact Sheet for Patients: EntrepreneurPulse.com.au  Fact Sheet for Healthcare Providers: IncredibleEmployment.be  This test is not yet approved or cleared by the [place] FDA and has been authorized for detection and/or diagnosis of SARS-CoV-2 by FDA under an Emergency Use Authorization (EUA). This EUA will remain in effect (meaning this test can be used) for the duration of the COVID-19 declaration under Section 564(b)(1) of the Act, 21 U.S.C. section 360bbb-3(b)(1), unless the authorization is terminated or revoked.  Performed at [facility], [place] [address], [place], [place] [address]   Urine Culture     Status: Abnormal    Collection Time: 10/20/21 11:00 AM   Specimen: Urine, Clean Catch  Result Value Ref Range Status   Specimen Description URINE, CLEAN CATCH  Final   Special Requests   Final    NONE Performed at [facility], [place] [address] [place], [place] [address]    Culture >=100,000 COLONIES/mL ESCHERICHIA COLI (A)  Final   Report Status 10/22/2021 FINAL  Final   Organism ID, Bacteria ESCHERICHIA COLI (A)  Final      Susceptibility   Escherichia coli - MIC*    AMPICILLIN 4 SENSITIVE Sensitive     CEFAZOLIN <=4 SENSITIVE Sensitive     CEFEPIME <=0.12 SENSITIVE Sensitive     CEFTRIAXONE <=0.25 SENSITIVE Sensitive     CIPROFLOXACIN <=0.25 SENSITIVE Sensitive     GENTAMICIN <=1 SENSITIVE Sensitive     IMIPENEM <=0.25 SENSITIVE Sensitive     NITROFURANTOIN <=16 SENSITIVE Sensitive     TRIMETH/SULFA <=20 SENSITIVE Sensitive     AMPICILLIN/SULBACTAM <=2 SENSITIVE Sensitive     PIP/TAZO <=4 SENSITIVE Sensitive     * >=100,000 COLONIES/mL ESCHERICHIA COLI  Culture, blood (routine x 2)     Status: None (Preliminary result)   Collection Time: 10/20/21  2:10 PM   Specimen: BLOOD  Result Value Ref Range Status   Specimen Description BLOOD LEFT ANTECUBITAL  Final   Special Requests   Final    BOTTLES DRAWN AEROBIC AND ANAEROBIC Blood Culture results may not be optimal due to an inadequate volume of blood received in culture bottles   Culture   Final    NO GROWTH 2 DAYS Performed at [place] [address] [place], [place] [address]    Report Status PENDING  Incomplete  Culture, blood (routine x 2)  Status: None (Preliminary result)   Collection Time: 10/20/21  2:10 PM   Specimen: BLOOD LEFT HAND  Result Value Ref Range Status   Specimen Description BLOOD LEFT HAND  Final   Special Requests   Final    BOTTLES DRAWN AEROBIC AND ANAEROBIC Blood Culture results may not be optimal due to an inadequate volume of blood received in culture bottles   Culture   Final    NO GROWTH 2  DAYS Performed at [facility], [place] [address] [place], [place] [address]    Report Status PENDING  Incomplete    FURTHER DISCHARGE INSTRUCTIONS:  Get Medicines reviewed and adjusted: Please take all your medications with you for your next visit with your Primary MD  Laboratory/radiological data: Please request your Primary MD to go over all hospital tests and procedure/radiological results at the follow up, please ask your Primary MD to get all Hospital records sent to his/her office.  In some cases, they will be blood work, cultures and biopsy results pending at the time of your discharge. Please request that your primary care M.D. goes through all the records of your hospital data and follows up on these results.  Also Note the following: If you experience worsening of your admission symptoms, develop shortness of breath, life threatening emergency, suicidal or homicidal thoughts you must seek medical attention immediately by calling 911 or calling your MD immediately  if symptoms less severe.  You must read complete instructions/literature along with all the possible adverse reactions/side effects for all the Medicines you take and that have been prescribed to you. Take any new Medicines after you have completely understood and accpet all the possible adverse reactions/side effects.   Do not drive when taking Pain medications or sleeping medications (Benzodaizepines)  Do not take more than prescribed Pain, Sleep and Anxiety Medications. It is not advisable to combine anxiety,sleep and pain medications without talking with your primary care practitioner  Special Instructions: If you have smoked or chewed Tobacco  in the last 2 yrs please stop smoking, stop any regular Alcohol  and or any Recreational drug use.  Wear Seat belts while driving.  Please note: You were cared for by a hospitalist during your hospital stay. Once you are discharged, your primary care physician will  handle any further medical issues. Please note that NO REFILLS for any discharge medications will be authorized once you are discharged, as it is imperative that you return to your primary care physician (or establish a relationship with a primary care physician if you do not have one) for your post hospital discharge needs so that they can reassess your need for medications and monitor your lab values.  Total Time spent coordinating discharge including counseling, education and face to face time equals greater than 30 minutes.  SignedOren Binet 10/23/2021 10:27 AM

## 2021-10-23 NOTE — Consult Note (Signed)
[facility] CM Inpatient Consult ? ? ?10/23/2021 ? ?[name] ?1976-06-14 ?[id number] ? ?[place] [facility] CM) ?  ?Patient chart has been reviewed with noted medium risk score for unplanned readmission. Patient assessed for community chronic care management and care coordination needs with Managed Medicaid CCM team. Spoke with patient bedside and explained the chronic disease management and care coordination services as additional follow-up beyond her primary care services. Patient agrees to post hospital follow-up with RN case manager. Referral to be placed. ? ?Of note, [facility] Care Management services does not replace or interfere with any services that are arranged by inpatient case management or social work.  ? ?Netta Cedars, MSN, RN ?[facility] Liaison ?Mobile Phone [phone]  ?Toll free office [phone] ?

## 2021-10-23 NOTE — Progress Notes (Signed)
Explained discharge summary to patient. Reviewed all follow up appointments and the next medication administration times. Patient verbalized having an understanding of her discharge instructions. Removed her PIV. Picked up meds from [facility] and transported to discharge lounge for discharge.  ?

## 2021-10-24 ENCOUNTER — Telehealth: Payer: Self-pay

## 2021-10-24 NOTE — Telephone Encounter (Signed)
Transition Care Management Unsuccessful Follow-up Telephone Call ? ?Date of discharge and from where:  10/23/2021 from [facility] ? ?Attempts:  1st Attempt ? ?Reason for unsuccessful TCM follow-up call:  Missing or invalid number ? ? ? ?

## 2021-10-25 LAB — CULTURE, BLOOD (ROUTINE X 2)
Culture: NO GROWTH
Culture: NO GROWTH

## 2021-10-27 NOTE — Telephone Encounter (Signed)
Transition Care Management Unsuccessful Follow-up Telephone Call ? ?Date of discharge and from where:  10/23/2021-[facility]  ? ?Attempts:  2nd Attempt ? ?Reason for unsuccessful TCM follow-up call:  Missing or invalid number ? ?  ?

## 2021-10-29 ENCOUNTER — Telehealth (HOSPITAL_COMMUNITY): Payer: Self-pay | Admitting: Pharmacist

## 2021-10-29 ENCOUNTER — Other Ambulatory Visit (HOSPITAL_COMMUNITY): Payer: Self-pay

## 2021-10-29 NOTE — Telephone Encounter (Signed)
Transitions of Care Pharmacy  ? ?Call attempted for a pharmacy transitions of care follow-up.  ? ?"Call cannot be completed at this time." ? ?Call attempt #1. Will follow-up in 2-3 days.  ? ? ?

## 2021-10-30 ENCOUNTER — Telehealth (HOSPITAL_COMMUNITY): Payer: Self-pay

## 2021-10-30 NOTE — Telephone Encounter (Signed)
Transitions of Care Pharmacy   Call attempted for a pharmacy transitions of care follow-up. Unable to leave voicemail.   Call attempt #2. Will follow-up in 2-3 days.    

## 2021-10-30 NOTE — Telephone Encounter (Signed)
Transition Care Management Unsuccessful Follow-up Telephone Call ? ?Date of discharge and from where:  10/23/2021 from [facility] ? ?Attempts:  3rd Attempt ? ?Reason for unsuccessful TCM follow-up call:  Unable to reach patient ? ? ? ?

## 2021-10-31 ENCOUNTER — Telehealth (HOSPITAL_COMMUNITY): Payer: Self-pay

## 2021-10-31 NOTE — Telephone Encounter (Signed)
Transitions of Care Pharmacy   Call attempted for a pharmacy transitions of care follow-up. Unable to leave voicemail.  Call attempt #3. Will no longer attempt follow up for TOC pharmacy   

## 2021-11-13 ENCOUNTER — Other Ambulatory Visit (HOSPITAL_COMMUNITY): Payer: Self-pay

## 2021-11-18 DIAGNOSIS — Z1152 Encounter for screening for COVID-19: Secondary | ICD-10-CM | POA: Diagnosis not present

## 2021-11-19 ENCOUNTER — Telehealth: Payer: Self-pay

## 2021-11-19 ENCOUNTER — Other Ambulatory Visit: Payer: Self-pay

## 2021-11-19 ENCOUNTER — Other Ambulatory Visit: Payer: Self-pay | Admitting: Nurse Practitioner

## 2021-11-19 DIAGNOSIS — E1165 Type 2 diabetes mellitus with hyperglycemia: Secondary | ICD-10-CM

## 2021-11-19 MED ORDER — INSULIN GLARGINE 100 UNIT/ML SOLOSTAR PEN: 36.0000 [IU] | PEN_INJECTOR | Freq: Every day | SUBCUTANEOUS | 3 refills | Status: DC

## 2021-11-19 NOTE — Telephone Encounter (Signed)
Insulin refill

## 2021-11-19 NOTE — Telephone Encounter (Signed)
Refills sent to the patient pharmacy we have on file [facility] pharmacy ?

## 2021-11-25 ENCOUNTER — Other Ambulatory Visit: Payer: Self-pay

## 2021-11-25 ENCOUNTER — Encounter: Payer: Self-pay | Admitting: Internal Medicine

## 2021-11-25 ENCOUNTER — Ambulatory Visit (INDEPENDENT_AMBULATORY_CARE_PROVIDER_SITE_OTHER): Payer: Medicaid Other | Admitting: Internal Medicine

## 2021-11-25 VITALS — BP 125/84 | HR 81 | Temp 98.8°F | Wt 269.0 lb

## 2021-11-25 DIAGNOSIS — B20 Human immunodeficiency virus [HIV] disease: Secondary | ICD-10-CM

## 2021-11-25 DIAGNOSIS — D72819 Decreased white blood cell count, unspecified: Secondary | ICD-10-CM

## 2021-11-25 DIAGNOSIS — R5081 Fever presenting with conditions classified elsewhere: Secondary | ICD-10-CM

## 2021-11-25 DIAGNOSIS — E119 Type 2 diabetes mellitus without complications: Secondary | ICD-10-CM | POA: Diagnosis not present

## 2021-11-25 NOTE — Progress Notes (Signed)
? ?  Subjective:  ? ? Patient ID: Beverly Johnson, female    DOB: August 27, 1975, 46 y.o.   MRN: [id number] ? ?HPI ?Here for follow up of HIV and hospital follow up ?She was hospitalized last month with fever for a prolonged period and no obvious site of infection except some possible signs of pyelonephritis on a CT scan.  She was also having issues with headaches and general malaise for about 1 month.  She was seen by neurology who did not feel normal pressure hydrocephalus was an issue and discharged on antibiotics.  She also had some leukopenia that was new.   ?She is better now, no further headaches, no fever and overall her usual self.   ? ? ?Review of Systems  ?Constitutional:  Negative for chills and fever.  ?Gastrointestinal:  Negative for diarrhea.  ?Skin:  Negative for rash.  ?Neurological:  Negative for dizziness, light-headedness and headaches.  ? ?   ?Objective:  ? Physical Exam ?Eyes:  ?   General: No scleral icterus. ?Pulmonary:  ?   Effort: Pulmonary effort is normal.  ?Skin: ?   Findings: No rash.  ?Neurological:  ?   General: No focal deficit present.  ?   Mental Status: She is alert.  ?Psychiatric:     ?   Mood and Affect: Mood normal.  ? ?SH: no tobacco ? ? ? ?   ?Assessment & Plan:  ? ? ?

## 2021-11-25 NOTE — Assessment & Plan Note (Signed)
Doing well on her medication and no changes indicated.  Will check her labs today and she can folllow up in 6 months.  ?

## 2021-11-25 NOTE — Assessment & Plan Note (Signed)
Discussed that she needs to get back into care for her diabetes.  ?

## 2021-11-25 NOTE — Assessment & Plan Note (Signed)
Unclear etiology though may be related to infection.  I will recheck today to see if it is resolving/improving.  ?

## 2021-11-26 LAB — T-HELPER CELLS (CD4) COUNT (NOT AT ARMC)
CD4 % Helper T Cell: 41 % (ref 33–65)
CD4 T Cell Abs: 489 /uL (ref 400–1790)

## 2021-11-28 ENCOUNTER — Encounter: Payer: Self-pay | Admitting: Nurse Practitioner

## 2021-11-28 ENCOUNTER — Ambulatory Visit (INDEPENDENT_AMBULATORY_CARE_PROVIDER_SITE_OTHER): Payer: Medicaid Other | Admitting: Nurse Practitioner

## 2021-11-28 VITALS — BP 132/87 | HR 77 | Temp 98.1°F | Ht 67.0 in | Wt 252.2 lb

## 2021-11-28 DIAGNOSIS — G588 Other specified mononeuropathies: Secondary | ICD-10-CM

## 2021-11-28 DIAGNOSIS — M792 Neuralgia and neuritis, unspecified: Secondary | ICD-10-CM

## 2021-11-28 DIAGNOSIS — E1165 Type 2 diabetes mellitus with hyperglycemia: Secondary | ICD-10-CM | POA: Diagnosis not present

## 2021-11-28 LAB — POCT GLYCOSYLATED HEMOGLOBIN (HGB A1C)
HbA1c POC (<> result, manual entry): 12.5 % (ref 4.0–5.6)
HbA1c, POC (controlled diabetic range): 12.5 % — AB (ref 0.0–7.0)
HbA1c, POC (prediabetic range): 12.5 % — AB (ref 5.7–6.4)
Hemoglobin A1C: 12.5 % — AB (ref 4.0–5.6)

## 2021-11-28 MED ORDER — CYCLOBENZAPRINE HCL 10 MG PO TABS: 10.0000 mg | ORAL_TABLET | Freq: Three times a day (TID) | ORAL | 0 refills | Status: DC | PRN

## 2021-11-28 MED ORDER — GABAPENTIN 300 MG PO CAPS: 300.0000 mg | ORAL_CAPSULE | Freq: Three times a day (TID) | ORAL | 3 refills | Status: DC

## 2021-11-28 MED ORDER — INSULIN GLARGINE 100 UNIT/ML SOLOSTAR PEN: 46.0000 [IU] | PEN_INJECTOR | Freq: Every day | SUBCUTANEOUS | 3 refills | Status: DC

## 2021-11-28 NOTE — Patient Instructions (Signed)
You were seen today in the [facility] for reevaluation of DM and right arm pain . Labs were collected, results will be available via MyChart or, if abnormal, you will be contacted by clinic staff. You were prescribed medications, please take as directed. Please follow up in 3 mths for reevaluation of symptoms.  ?

## 2021-11-28 NOTE — Progress Notes (Signed)
? ?[facility], [place]  [address] ?Phone:  [phone]   Fax:  [phone] ?Subjective:  ? Patient ID: Beverly Johnson, female    DOB: Dec 11, 1975, 46 y.o.   MRN: [id number] ? ?Chief Complaint  ?Patient presents with  ? Follow-up  ?  Patient is here today for her 6 week DM follow up and to discuss her right arm pain ans spasms x 1 month or more. Patient states that she told her infectious disease doctor about it and he sent her to the ED. Patient was given pain medications but one medication didn't work and the other medication is to strong for her to be able to function at work.  ? ?HPI ?Beverly Johnson 46 y.o. female  has a past medical history of DM2 (diabetes mellitus, type 2) ([place]) and HIV infection ([place]). To the [facility] for reevaluation of DM and left arm pain  ? ?Diabetes Mellitus: Patient presents for follow up of diabetes. Symptoms: none. Denies any symptoms. Patient denies none.  Evaluation to date has been included: hemoglobin A1C.  Home sugars: BGs have been labile ranging between 199 and 257 . Treatment to date: no recent interventions.  ? ?States that she has had right upper arm pain for 3-4 mths, has been evaluated several times in the past with only temporary relief in symptoms. Currently rates pain 10/10, describes as sharp pain. Endorses some numbness in right hand.  ? ?Currently works as a Clinical research associate at [employer]. Denies adhering to any diet and/ exercise regimen. Denies any recent trauma or injury. Denies any concerns today.  ? ?Denies any fatigue, chest pain, shortness of breath, HA or dizziness. Denies any blurred vision. ? ?Past Medical History:  ?Diagnosis Date  ? DM2 (diabetes mellitus, type 2) ([place])   ? HIV infection ([place])   ? ? ?Past Surgical History:  ?Procedure Laterality Date  ? CESAREAN SECTION    ? x3  ? CHOLECYSTECTOMY  06/29/2011  ? Procedure: LAPAROSCOPIC CHOLECYSTECTOMY WITH INTRAOPERATIVE CHOLANGIOGRAM;  Surgeon: Merrie Roof, MD;   Location: [facility];  Service: General;  Laterality: N/A;  ? TUBAL LIGATION    ? ? ?Family History  ?Problem Relation Age of Onset  ? Healthy Mother   ? Healthy Father   ? ? ?Social History  ? ?Socioeconomic History  ? Marital status: Married  ?  Spouse name: Not on file  ? Number of children: Not on file  ? Years of education: 10  ? Highest education level: Not on file  ?Occupational History  ?  Employer: UNEMPLOYED  ?Tobacco Use  ? Smoking status: Never  ? Smokeless tobacco: Never  ?Vaping Use  ? Vaping Use: Never used  ?Substance and Sexual Activity  ? Alcohol use: No  ? Drug use: Not Currently  ?  Types: Marijuana  ?  Comment: CBD  ? Sexual activity: Yes  ?  Partners: Male  ?  Comment: pt. declined condoms  ?Other Topics Concern  ? Not on file  ?Social History Narrative  ? Not on file  ? ?Social Determinants of Health  ? ?Financial Resource Strain: Not on file  ?Food Insecurity: No Food Insecurity  ? Worried About Charity fundraiser in the Last Year: Never true  ? Ran Out of Food in the Last Year: Never true  ?Transportation Needs: No Transportation Needs  ? Lack of Transportation (Medical): No  ? Lack of Transportation (Non-Medical): No  ?Physical Activity: Not on  file  ?Stress: Not on file  ?Social Connections: Not on file  ?Intimate Partner Violence: Not on file  ? ? ?Outpatient Medications Prior to Visit  ?Medication Sig Dispense Refill  ? Accu-Chek Softclix Lancets lancets 1 each 3 (three) times daily.    ? elvitegravir-cobicistat-emtricitabine-tenofovir (GENVOYA) 150-150-200-10 MG TABS tablet Take 1 tablet by mouth daily with breakfast. 30 tablet 11  ? glipiZIDE (GLUCOTROL) 10 MG tablet Take 1 tablet (10 mg total) by mouth 2 (two) times daily before a meal. 60 tablet 3  ? glucose blood (ACCU-CHEK GUIDE) test strip Use as instructed 100 each 12  ? glucose blood test strip Check your sugar in the morning before you eat breakfast, and one hour after a meal. 100 each 2  ? glucose monitoring kit (FREESTYLE)  monitoring kit 1 each by Does not apply route daily. Check glucose once in the morning before breakfast and 1 hour after a meal 1 each 0  ? rosuvastatin (CRESTOR) 10 MG tablet Take 1 tablet (10 mg total) by mouth daily. 90 tablet 1  ? sitaGLIPtin (JANUVIA) 100 MG tablet Take 1 tablet (100 mg total) by mouth daily. 30 tablet 11  ? traMADol (ULTRAM) 50 MG tablet Take 1 tablet (50 mg total) by mouth every 6 (six) hours as needed. (Patient taking differently: Take 50 mg by mouth every 6 (six) hours as needed for moderate pain.) 15 tablet 0  ? insulin glargine (LANTUS) 100 UNIT/ML Solostar Pen Inject 36 Units into the skin daily. 15 mL 3  ? GLOBAL EASE INJECT PEN NEEDLES 32G X 4 MM MISC     ? topiramate (TOPAMAX) 25 MG tablet Take 1 tab (25 mg) nightly for 1 week, then 1 tab (25 mg) twice daily for 1 week, then 1 tab (25 mg) in morning and 2 tabs (50 mg) in evening for 1 week, then  2 tabs (50 mg) twice daily (Patient not taking: Reported on 11/28/2021) 120 tablet 2  ? metFORMIN (GLUCOPHAGE) 500 MG tablet Take 2 tablets (1,000 mg total) by mouth 2 (two) times daily with a meal. (Patient not taking: Reported on 11/28/2021) 120 tablet 11  ? methocarbamol (ROBAXIN) 500 MG tablet Take 1 tablet (500 mg total) by mouth at bedtime as needed for muscle spasms. (Patient not taking: Reported on 11/28/2021) 20 tablet 0  ? ?No facility-administered medications prior to visit.  ? ? ?No Known Allergies ? ?Review of Systems  ?Constitutional:  Negative for chills, fever and malaise/fatigue.  ?Respiratory:  Negative for cough and shortness of breath.   ?Cardiovascular:  Negative for chest pain, palpitations and leg swelling.  ?Gastrointestinal:  Negative for abdominal pain, blood in stool, constipation, diarrhea, nausea and vomiting.  ?Musculoskeletal:  Positive for myalgias. Negative for back pain, falls, joint pain and neck pain.  ?     See HPI  ?Skin: Negative.   ?Neurological: Negative.   ?Psychiatric/Behavioral:  Negative for  depression. The patient is not nervous/anxious.   ?All other systems reviewed and are negative. ? ?   ?Objective:  ?  ?Physical Exam ?Constitutional:   ?   General: She is not in acute distress. ?   Appearance: Normal appearance. She is obese.  ?HENT:  ?   Head: Normocephalic.  ?Neck:  ?   Vascular: No carotid bruit.  ?Cardiovascular:  ?   Rate and Rhythm: Normal rate and regular rhythm.  ?   Pulses: Normal pulses.  ?   Heart sounds: Normal heart sounds.  ?   Comments:  No obvious peripheral edema ?Pulmonary:  ?   Effort: Pulmonary effort is normal.  ?   Breath sounds: Normal breath sounds.  ?Musculoskeletal:     ?   General: No swelling, tenderness, deformity or signs of injury.  ?   Cervical back: Normal range of motion and neck supple. No rigidity or tenderness.  ?   Right lower leg: No edema.  ?   Comments: LROM in the RUE due to pain. Pain noted with palpation of midline thoracic spine  ?Lymphadenopathy:  ?   Cervical: No cervical adenopathy.  ?Skin: ?   General: Skin is warm and dry.  ?   Capillary Refill: Capillary refill takes less than 2 seconds.  ?Neurological:  ?   General: No focal deficit present.  ?   Mental Status: She is alert and oriented to person, place, and time.  ?Psychiatric:     ?   Mood and Affect: Mood normal.     ?   Behavior: Behavior normal.     ?   Thought Content: Thought content normal.     ?   Judgment: Judgment normal.  ? ? ?BP 132/87   Pulse 77   Temp 98.1 ?F (36.7 ?C)   Ht 5' 7"  (1.702 m)   Wt 252 lb 3.2 oz (114.4 kg)   SpO2 98%   BMI 39.50 kg/m?  ?Wt Readings from Last 3 Encounters:  ?11/28/21 252 lb 3.2 oz (114.4 kg)  ?11/25/21 269 lb (122 kg)  ?10/20/21 248 lb (112.5 kg)  ? ? ?Immunization History  ?Administered Date(s) Administered  ? H1N1 08/22/2008  ? Hepatitis A 07/19/2006, 12/08/2010  ? Hepatitis B 07/19/2006, 09/29/2006, 04/13/2007  ? Influenza Split 09/11/2011  ? Influenza Whole 07/19/2006, 09/29/2006, 07/26/2007, 08/22/2008  ? PFIZER(Purple Top)SARS-COV-2  Vaccination 04/09/2020, 04/30/2020  ? Pneumococcal Conjugate-13 11/26/2020  ? Pneumococcal Polysaccharide-23 09/29/2006, 09/11/2011  ? ? ?Diabetic Foot Exam - Simple   ?No data filed ?  ? ? ?Lab Results  ?Component Value Date  ? TSH 1

## 2021-11-29 LAB — HEPATIC FUNCTION PANEL
AG Ratio: 0.9 (calc) — ABNORMAL LOW (ref 1.0–2.5)
ALT: 10 U/L (ref 6–29)
AST: 16 U/L (ref 10–30)
Albumin: 3.5 g/dL — ABNORMAL LOW (ref 3.6–5.1)
Alkaline phosphatase (APISO): 112 U/L (ref 31–125)
Bilirubin, Direct: 0.1 mg/dL (ref 0.0–0.2)
Globulin: 3.8 g/dL (calc) — ABNORMAL HIGH (ref 1.9–3.7)
Indirect Bilirubin: 0.2 mg/dL (calc) (ref 0.2–1.2)
Total Bilirubin: 0.3 mg/dL (ref 0.2–1.2)
Total Protein: 7.3 g/dL (ref 6.1–8.1)

## 2021-11-29 LAB — HIV-1 RNA QUANT-NO REFLEX-BLD
HIV 1 RNA Quant: 249 copies/mL — ABNORMAL HIGH
HIV-1 RNA Quant, Log: 2.4 Log copies/mL — ABNORMAL HIGH

## 2021-12-03 DIAGNOSIS — Z1152 Encounter for screening for COVID-19: Secondary | ICD-10-CM | POA: Diagnosis not present

## 2021-12-10 DIAGNOSIS — Z1152 Encounter for screening for COVID-19: Secondary | ICD-10-CM | POA: Diagnosis not present

## 2021-12-15 ENCOUNTER — Other Ambulatory Visit: Payer: Self-pay | Admitting: Nurse Practitioner

## 2021-12-15 DIAGNOSIS — E1165 Type 2 diabetes mellitus with hyperglycemia: Secondary | ICD-10-CM

## 2021-12-17 DIAGNOSIS — Z1152 Encounter for screening for COVID-19: Secondary | ICD-10-CM | POA: Diagnosis not present

## 2021-12-24 DIAGNOSIS — Z1152 Encounter for screening for COVID-19: Secondary | ICD-10-CM | POA: Diagnosis not present

## 2021-12-30 DIAGNOSIS — Z1152 Encounter for screening for COVID-19: Secondary | ICD-10-CM | POA: Diagnosis not present

## 2022-01-10 ENCOUNTER — Ambulatory Visit (HOSPITAL_COMMUNITY)
Admission: EM | Admit: 2022-01-10 | Discharge: 2022-01-10 | Disposition: A | Discharge status: Home / Self Care | Payer: Medicaid Other

## 2022-01-10 ENCOUNTER — Encounter (HOSPITAL_COMMUNITY): Payer: Self-pay

## 2022-01-10 DIAGNOSIS — N760 Acute vaginitis: Secondary | ICD-10-CM | POA: Diagnosis not present

## 2022-01-10 DIAGNOSIS — R3129 Other microscopic hematuria: Secondary | ICD-10-CM

## 2022-01-10 NOTE — ED Provider Notes (Signed)
[facility]    CSN: [id number] Arrival date & time: 01/10/22  1216      History   Chief Complaint Chief Complaint  Patient presents with   Vaginitis    HPI Beverly Johnson is a 46 y.o. female.   46 year old female presents with vaginal bleeding.  Patient defers to have a urinalysis or pelvic exam at this time.  Patient relates that her last menses was around May 8, this lasted for about 5 days, which is normal for her.  Her menses occurred at the normal time.  Patient relates this morning she awakened and she noticed that she was having vaginal bleeding.  She thought that the blood was also in the urine, however she relates she is not having any dysuria, and on further inspection she relates that the blood was not in the urine.  Patient relates she is not having any fever, chills, abdominal pain, no pelvic pain.  Patient relates she is not having any back pain.  Patient denies being under any increased stress, she just saw her HIV provider couple months ago and has a recheck in about 3 months, she also just saw her diabetic physician in April and has a recheck and 3 months.  Patient relates her glucose averages about 200.  Patient does relate that her and her husband are looking for a place to live but she denies being under any increased stress over the past couple weeks.  Patient is just concerned about all of a sudden having vaginal bleeding start.     Past Medical History:  Diagnosis Date   DM2 (diabetes mellitus, type 2) ([place])    HIV infection ([place])     Patient Active Problem List   Diagnosis Date Noted   Leukopenia 11/25/2021   Sepsis ([place]) 10/21/2021   Fever 10/20/2021   Need for prophylactic vaccination and inoculation against influenza 05/28/2021   Adenomyosis 02/24/2021   Vulvovaginal candidiasis 01/16/2021   Dysmenorrhea 01/16/2021   Callus of foot 11/26/2020   Heavy menses 11/26/2020   History of 2019 novel coronavirus disease (COVID-19) 06/16/2019    Abnormal ECG 06/16/2019   Medication monitoring encounter 11/12/2017   Breast nodule 09/02/2017   Screening examination for venereal disease 03/30/2017   Encounter for long-term (current) use of high-risk medication 03/30/2017   Microalbuminuria due to type 2 diabetes mellitus ([place]) 03/30/2017   Knee pain, chronic 03/30/2017   Vitamin D deficiency 03/21/2017   Healthcare maintenance 03/02/2014   Class 2 obesity due to excess calories with body mass index (BMI) of 38.0 to 38.9 in adult 04/27/2012   Type 2 diabetes mellitus without complications ([place]) 62/95/2841   Human immunodeficiency virus (HIV) disease ([place]) 09/29/2006    Past Surgical History:  Procedure Laterality Date   CESAREAN SECTION     x3   CHOLECYSTECTOMY  06/29/2011   Procedure: LAPAROSCOPIC CHOLECYSTECTOMY WITH INTRAOPERATIVE CHOLANGIOGRAM;  Surgeon: Merrie Roof, MD;  Location: [facility];  Service: General;  Laterality: N/A;   TUBAL LIGATION      OB History     Gravida  3   Para  3   Term  3   Preterm      AB      Living  3      SAB      IAB      Ectopic      Multiple      Live Births  3        Obstetric Comments  c-section x 3. Last one with BTL done in the mid 2000s          Home Medications    Prior to Admission medications   Medication Sig Start Date End Date Taking? Authorizing Provider  Accu-Chek Softclix Lancets lancets 1 each 3 (three) times daily. 02/06/21   [provider]  cyclobenzaprine (FLEXERIL) 10 MG tablet Take 1 tablet (10 mg total) by mouth 3 (three) times daily as needed for muscle spasms. 11/28/21   Bo Merino I, NP  elvitegravir-cobicistat-emtricitabine-tenofovir (GENVOYA) 150-150-200-10 MG TABS tablet Take 1 tablet by mouth daily with breakfast. 05/28/21   Comer, Okey Regal, MD  gabapentin (NEURONTIN) 300 MG capsule Take 1 capsule (300 mg total) by mouth 3 (three) times daily. 11/28/21   Passmore, Jake Church I, NP  glipiZIDE (GLUCOTROL) 10 MG tablet TAKE ONE  TABLET BY MOUTH TWICE A DAY BEFORE MEALS 12/15/21   Bo Merino I, NP  GLOBAL EASE INJECT PEN NEEDLES 32G X 4 MM MISC  11/15/21   [provider]  glucose blood (ACCU-CHEK GUIDE) test strip Use as instructed 03/19/21   Vevelyn Francois, NP  glucose blood test strip Check your sugar in the morning before you eat breakfast, and one hour after a meal. 05/26/19   Melynda Ripple, MD  glucose monitoring kit (FREESTYLE) monitoring kit 1 each by Does not apply route daily. Check glucose once in the morning before breakfast and 1 hour after a meal 02/05/21   Vevelyn Francois, NP  insulin glargine (LANTUS) 100 UNIT/ML Solostar Pen Inject 46 Units into the skin daily. 11/28/21   Passmore, Jake Church I, NP  rosuvastatin (CRESTOR) 10 MG tablet Take 1 tablet (10 mg total) by mouth daily. 10/17/21   Vevelyn Francois, NP  sitaGLIPtin (JANUVIA) 100 MG tablet Take 1 tablet (100 mg total) by mouth daily. 03/19/21   Vevelyn Francois, NP  topiramate (TOPAMAX) 25 MG tablet Take 1 tab (25 mg) nightly for 1 week, then 1 tab (25 mg) twice daily for 1 week, then 1 tab (25 mg) in morning and 2 tabs (50 mg) in evening for 1 week, then  2 tabs (50 mg) twice daily Patient not taking: Reported on 11/28/2021 10/23/21   Jonetta Osgood, MD  traMADol (ULTRAM) 50 MG tablet Take 1 tablet (50 mg total) by mouth every 6 (six) hours as needed. Patient taking differently: Take 50 mg by mouth every 6 (six) hours as needed for moderate pain. 10/13/21   Chase Picket, MD  cetirizine (ZYRTEC ALLERGY) 10 MG tablet Take 1 tablet (10 mg total) by mouth daily. Patient not taking: No sig reported 11/26/19 11/08/20  Henderly, Britni A, PA-C    Family History Family History  Problem Relation Age of Onset   Healthy Mother    Healthy Father     Social History Social History   Tobacco Use   Smoking status: Never   Smokeless tobacco: Never  Vaping Use   Vaping Use: Never used  Substance Use Topics   Alcohol use: No   Drug use: Not Currently     Types: Marijuana    Comment: CBD     Allergies   Patient has no known allergies.   Review of Systems Review of Systems   Physical Exam Triage Vital Signs ED Triage Vitals  Enc Vitals Group     BP 01/10/22 1328 123/84     Pulse Rate 01/10/22 1328 99     Resp 01/10/22 1328 18  Temp 01/10/22 1328 98.6 F (37 C)     Temp Source 01/10/22 1328 Oral     SpO2 01/10/22 1328 99 %     Weight --      Height --      Head Circumference --      Peak Flow --      Pain Score 01/10/22 1327 2     Pain Loc --      Pain Edu? --      Excl. in [place]? --    No data found.  Updated Vital Signs BP 123/84 (BP Location: Left Arm)   Pulse 99   Temp 98.6 F (37 C) (Oral)   Resp 18   LMP  (LMP Unknown)   SpO2 99%   Visual Acuity Right Eye Distance:   Left Eye Distance:   Bilateral Distance:    Right Eye Near:   Left Eye Near:    Bilateral Near:     Physical Exam Constitutional:      Appearance: Normal appearance.  Abdominal:     General: Abdomen is flat. Bowel sounds are normal.     Palpations: Abdomen is soft.     Tenderness: There is no abdominal tenderness. There is no guarding or rebound.     Comments: Pelvis: On palpation of the suprapubic area there is minimal tenderness on palpation of the ovaries bilaterally, no masses, no guarding.  Neurological:     Mental Status: She is alert.     UC Treatments / Results  Labs (all labs ordered are listed, but only abnormal results are displayed) Labs Reviewed - No data to display  EKG   Radiology No results found.  Procedures Procedures (including critical care time)  Medications Ordered in UC Medications - No data to display  Initial Impression / Assessment and Plan / UC Course  I have reviewed the triage vital signs and the nursing notes.  Pertinent labs & imaging results that were available during my care of the patient were reviewed by me and considered in my medical decision making (see chart for  details).    Plan: 1.  Adv care if symptoms fail to improve.  Ised to observe watchful waiting over the next week. 2.  Advised if fever develops, abdominal pain, or other unusual symptoms she is to be evaluated at that time. 3.  Advised to follow-up with PCP or return to Urgent Care if symptoms fail to improve. Final Clinical Impressions(s) / UC Diagnoses   Final diagnoses:  Vaginitis and vulvovaginitis  Other microscopic hematuria     Discharge Instructions      Advised to observe and watchful waiting over the next week. If any unusual symptoms such as fever, abdominal pain, pain on urination starts occurring then needs to be reevaluated at that time.    ED Prescriptions   None    PDMP not reviewed this encounter.   Nyoka Lint, PA-C 01/10/22 1414

## 2022-01-10 NOTE — ED Triage Notes (Signed)
Pt presents with vaginal odor, blood in urine, and vaginal irritation that she noticed yesterday.

## 2022-01-10 NOTE — Discharge Instructions (Addendum)
Advised to observe and watchful waiting over the next week. If any unusual symptoms such as fever, abdominal pain, pain on urination starts occurring then needs to be reevaluated at that time.

## 2022-02-11 ENCOUNTER — Emergency Department (HOSPITAL_COMMUNITY)
Admission: EM | Admit: 2022-02-11 | Discharge: 2022-02-11 | Disposition: A | Discharge status: Home / Self Care | Payer: Medicaid Other | Attending: Emergency Medicine | Admitting: Emergency Medicine

## 2022-02-11 ENCOUNTER — Other Ambulatory Visit: Payer: Self-pay

## 2022-02-11 ENCOUNTER — Encounter (HOSPITAL_COMMUNITY): Payer: Self-pay | Admitting: Pharmacy Technician

## 2022-02-11 DIAGNOSIS — E119 Type 2 diabetes mellitus without complications: Secondary | ICD-10-CM | POA: Diagnosis not present

## 2022-02-11 DIAGNOSIS — M79604 Pain in right leg: Secondary | ICD-10-CM

## 2022-02-11 DIAGNOSIS — Z794 Long term (current) use of insulin: Secondary | ICD-10-CM | POA: Diagnosis not present

## 2022-02-11 DIAGNOSIS — Z21 Asymptomatic human immunodeficiency virus [HIV] infection status: Secondary | ICD-10-CM | POA: Insufficient documentation

## 2022-02-11 DIAGNOSIS — Z7984 Long term (current) use of oral hypoglycemic drugs: Secondary | ICD-10-CM | POA: Diagnosis not present

## 2022-02-11 MED ORDER — METHOCARBAMOL 500 MG PO TABS
500.0000 mg | ORAL_TABLET | Freq: Once | ORAL | Status: AC
  Administered 2022-02-11: 500 mg via ORAL
  Filled 2022-02-11: qty 1

## 2022-02-11 MED ORDER — OXYCODONE-ACETAMINOPHEN 5-325 MG PO TABS: 1.0000 | ORAL_TABLET | ORAL | 0 refills | Status: DC | PRN

## 2022-02-11 MED ORDER — OXYCODONE-ACETAMINOPHEN 5-325 MG PO TABS
2.0000 | ORAL_TABLET | Freq: Once | ORAL | Status: AC
  Administered 2022-02-11: 2 via ORAL
  Filled 2022-02-11: qty 2

## 2022-02-11 MED ORDER — METHOCARBAMOL 500 MG PO TABS: 500.0000 mg | ORAL_TABLET | Freq: Two times a day (BID) | ORAL | 0 refills | Status: DC

## 2022-02-11 NOTE — ED Triage Notes (Signed)
Pt here with R leg pain onset yesterday at work. Denies injury. Pt reports it is the entire leg that hurts.

## 2022-02-11 NOTE — ED Provider Notes (Signed)
[facility] EMERGENCY DEPARTMENT Provider Note   CSN: [id number] Arrival date & time: 02/11/22  8016     History  Chief Complaint  Patient presents with   Leg Pain    Beverly Johnson is a 46 y.o. female.  The history is provided by the patient and medical records.  Leg Pain  46 year old female with history of obesity, HIV, DM 2, presenting to the ED with right leg pain.  States began yesterday while at work.  She denies any direct injury, trauma, or fall.  Pain radiates throughout her entire right leg.  She denies any associated numbness or weakness.  She is not having any back pain.  She denies any history of same in the past.  Pain described as sharp and stabbing.  No cramping or swelling.  She has not had any fever or chills.  No history of DVT or other clotting disorder.  She does have a fairly physical job but this is not new.  Home Medications Prior to Admission medications   Medication Sig Start Date End Date Taking? Authorizing Provider  methocarbamol (ROBAXIN) 500 MG tablet Take 1 tablet (500 mg total) by mouth 2 (two) times daily. 02/11/22  Yes Larene Pickett, PA-C  oxyCODONE-acetaminophen (PERCOCET) 5-325 MG tablet Take 1 tablet by mouth every 4 (four) hours as needed. 02/11/22  Yes Larene Pickett, PA-C  Accu-Chek Softclix Lancets lancets 1 each 3 (three) times daily. 02/06/21   [provider]  cyclobenzaprine (FLEXERIL) 10 MG tablet Take 1 tablet (10 mg total) by mouth 3 (three) times daily as needed for muscle spasms. 11/28/21   Bo Merino I, NP  elvitegravir-cobicistat-emtricitabine-tenofovir (GENVOYA) 150-150-200-10 MG TABS tablet Take 1 tablet by mouth daily with breakfast. 05/28/21   Comer, Okey Regal, MD  gabapentin (NEURONTIN) 300 MG capsule Take 1 capsule (300 mg total) by mouth 3 (three) times daily. 11/28/21   Passmore, Jake Church I, NP  glipiZIDE (GLUCOTROL) 10 MG tablet TAKE ONE TABLET BY MOUTH TWICE A DAY BEFORE MEALS 12/15/21   Bo Merino I, NP  GLOBAL EASE INJECT PEN NEEDLES 32G X 4 MM MISC  11/15/21   [provider]  glucose blood (ACCU-CHEK GUIDE) test strip Use as instructed 03/19/21   Vevelyn Francois, NP  glucose blood test strip Check your sugar in the morning before you eat breakfast, and one hour after a meal. 05/26/19   Melynda Ripple, MD  glucose monitoring kit (FREESTYLE) monitoring kit 1 each by Does not apply route daily. Check glucose once in the morning before breakfast and 1 hour after a meal 02/05/21   Vevelyn Francois, NP  insulin glargine (LANTUS) 100 UNIT/ML Solostar Pen Inject 46 Units into the skin daily. 11/28/21   Passmore, Jake Church I, NP  rosuvastatin (CRESTOR) 10 MG tablet Take 1 tablet (10 mg total) by mouth daily. 10/17/21   Vevelyn Francois, NP  sitaGLIPtin (JANUVIA) 100 MG tablet Take 1 tablet (100 mg total) by mouth daily. 03/19/21   Vevelyn Francois, NP  topiramate (TOPAMAX) 25 MG tablet Take 1 tab (25 mg) nightly for 1 week, then 1 tab (25 mg) twice daily for 1 week, then 1 tab (25 mg) in morning and 2 tabs (50 mg) in evening for 1 week, then  2 tabs (50 mg) twice daily Patient not taking: Reported on 11/28/2021 10/23/21   Jonetta Osgood, MD  traMADol (ULTRAM) 50 MG tablet Take 1 tablet (50 mg total) by mouth every 6 (six)  hours as needed. Patient taking differently: Take 50 mg by mouth every 6 (six) hours as needed for moderate pain. 10/13/21   Chase Picket, MD  cetirizine (ZYRTEC ALLERGY) 10 MG tablet Take 1 tablet (10 mg total) by mouth daily. Patient not taking: No sig reported 11/26/19 11/08/20  Henderly, Britni A, PA-C      Allergies    Patient has no known allergies.    Review of Systems   Review of Systems  Musculoskeletal:  Positive for arthralgias.  All other systems reviewed and are negative.   Physical Exam Updated Vital Signs BP 128/88 (BP Location: Right Arm)   Pulse 82   Temp 98.6 F (37 C) (Oral)   Resp 16   SpO2 100%   Physical Exam Vitals and nursing note  reviewed.  Constitutional:      Appearance: She is well-developed.  HENT:     Head: Normocephalic and atraumatic.  Eyes:     Conjunctiva/sclera: Conjunctivae normal.     Pupils: Pupils are equal, round, and reactive to light.  Cardiovascular:     Rate and Rhythm: Normal rate and regular rhythm.     Heart sounds: Normal heart sounds.  Pulmonary:     Effort: Pulmonary effort is normal.     Breath sounds: Normal breath sounds.  Abdominal:     General: Bowel sounds are normal.     Palpations: Abdomen is soft.  Musculoskeletal:        General: Normal range of motion.     Cervical back: Normal range of motion.     Comments: Right leg normal in appearance without visible swelling, no overlying erythema, induration, or evidence of cellulitis, + SLR on right, negative on left; DP pulses intact BLE, no strength/sensory deficit present No pain/tenderness noted throughout lower back  Skin:    General: Skin is warm and dry.  Neurological:     Mental Status: She is alert and oriented to person, place, and time.     ED Results / Procedures / Treatments   Labs (all labs ordered are listed, but only abnormal results are displayed) Labs Reviewed - No data to display  EKG None  Radiology No results found.  Procedures Procedures    Medications Ordered in ED Medications  oxyCODONE-acetaminophen (PERCOCET/ROXICET) 5-325 MG per tablet 2 tablet (2 tablets Oral Given 02/11/22 0938)  methocarbamol (ROBAXIN) tablet 500 mg (500 mg Oral Given 02/11/22 3244)    ED Course/ Medical Decision Making/ A&P                           Medical Decision Making Risk Prescription drug management.   46 year old female presenting to the ED with diffuse right leg pain.  No injury, trauma, or falls.  Pain radiates through her entire leg, described as sharp and stabbing.  She is afebrile, nontoxic.  She does not have any focal deficits on exam and denies any significant back pain.  She does however have a  positive straight leg raise on right.  No leg swelling, erythema, or induration to suggest infectious cause.  No hx of DVTs.  She remains ambulatory with steady gait. Suspect likely radicular in nature given exam findings.  No red flag symptoms to suggest cauda equina or other spinal emergency.  Will treat symptomatically and have her follow-up with PCP.  Work note provided.  Can return here for new concerns.  Final Clinical Impression(s) / ED Diagnoses Final diagnoses:  Right leg pain  Rx / DC Orders ED Discharge Orders          Ordered    oxyCODONE-acetaminophen (PERCOCET) 5-325 MG tablet  Every 4 hours PRN        02/11/22 0912    methocarbamol (ROBAXIN) 500 MG tablet  2 times daily        02/11/22 0912              Larene Pickett, PA-C 02/11/22 1003    Carmin Muskrat, MD 02/11/22 1157

## 2022-02-11 NOTE — Discharge Instructions (Signed)
Take the prescribed medication as directed. °Follow-up with your primary care doctor. °Return to the ED for new or worsening symptoms. °

## 2022-02-24 ENCOUNTER — Other Ambulatory Visit: Payer: Self-pay | Admitting: Nurse Practitioner

## 2022-02-24 DIAGNOSIS — E1165 Type 2 diabetes mellitus with hyperglycemia: Secondary | ICD-10-CM

## 2022-03-09 ENCOUNTER — Encounter: Payer: Self-pay | Admitting: Infectious Diseases

## 2022-03-09 ENCOUNTER — Other Ambulatory Visit: Payer: Self-pay

## 2022-03-09 ENCOUNTER — Ambulatory Visit (INDEPENDENT_AMBULATORY_CARE_PROVIDER_SITE_OTHER): Payer: Medicaid Other | Admitting: Infectious Diseases

## 2022-03-09 VITALS — BP 111/78 | HR 86 | Temp 97.9°F | Wt 257.0 lb

## 2022-03-09 DIAGNOSIS — N8003 Adenomyosis of the uterus: Secondary | ICD-10-CM | POA: Diagnosis not present

## 2022-03-09 DIAGNOSIS — N920 Excessive and frequent menstruation with regular cycle: Secondary | ICD-10-CM

## 2022-03-09 DIAGNOSIS — D649 Anemia, unspecified: Secondary | ICD-10-CM | POA: Insufficient documentation

## 2022-03-09 DIAGNOSIS — B20 Human immunodeficiency virus [HIV] disease: Secondary | ICD-10-CM

## 2022-03-09 NOTE — Patient Instructions (Addendum)
Give the office a call to see if you can be reassigned to a new primary care doctor - you need their help with diabetes management. Maybe one of the once weekly injections will work well for you and help with fatigue of medications.   I want you to call to schedule an appointment with Dr. Ilda Basset (GYN) to discuss an IUD (mirena) to see if that helps with your bleeding  Will let you know what your blood work shows today. If your hemoglobin is very low I will need to refer you to the emergency room for blood transfusion for treatment.   Please schedule a follow up with Dr. Linus Salmons in October. Please try to get the Genvoya pill in everyday one time - I don't want you to develop any resisistance to this. Will see what your blood work looks like. We may need to switch your pill to something that works better based on your labs.

## 2022-03-09 NOTE — Progress Notes (Signed)
Subjective:   Chief Complaint  Patient presents with   Follow-up    Doing a study in [place] and they recommend patient see b20 provider to take something for anemia     HPI:  Beverly Johnson is here after her research team in [place] advised her to come get on something for her anemia before something bad happens.  She has no knowledge about how anemic she was. Denies any headaches, SOB, dizziness, syncope/pre-syncope. She does have some chills at times that are relatively new. She has very heavy periods and always has. She uses 3 big pads a day to catch flow; typically stops after 4 days. Recently she had 2 cycles in month but that was unusual for her. She is due for her menstrual cycle to come soon within a week or so. Has had GYN eval in the past for possible hysterectomy vs medical management of fibroids but A1C was too high for surgery and she did not follow up. She has had 2 miscarriages prior to her 3 children. Had surgical sterilization with last child and no plans for further children.   She mentions to me that she "barely takes her genvoya" due to pill fatigue with other medications. She knows she is supposed to take it once or twice a day.  Main thing she takes is her insulin.     Review of Systems: Review of Systems  Constitutional:  Positive for chills. Negative for activity change, appetite change and fatigue.  Respiratory:  Negative for chest tightness and shortness of breath.   Cardiovascular:  Negative for chest pain and palpitations.  Gastrointestinal: Negative.   Genitourinary:  Positive for vaginal bleeding.  Skin:  Negative for color change and pallor.  Hematological:  Does not bruise/bleed easily.     Past Medical History:  Diagnosis Date   DM2 (diabetes mellitus, type 2) ([employer])    HIV infection ([place])     Outpatient Medications Prior to Visit  Medication Sig Dispense Refill   Accu-Chek Softclix Lancets lancets 1 each 3 (three) times daily.      cyclobenzaprine (FLEXERIL) 10 MG tablet Take 1 tablet (10 mg total) by mouth 3 (three) times daily as needed for muscle spasms. 30 tablet 0   elvitegravir-cobicistat-emtricitabine-tenofovir (GENVOYA) 150-150-200-10 MG TABS tablet Take 1 tablet by mouth daily with breakfast. 30 tablet 11   gabapentin (NEURONTIN) 300 MG capsule Take 1 capsule (300 mg total) by mouth 3 (three) times daily. 90 capsule 3   glipiZIDE (GLUCOTROL) 10 MG tablet TAKE ONE TABLET BY MOUTH TWICE A DAY BEFORE MEALS 60 tablet 1   GLOBAL EASE INJECT PEN NEEDLES 32G X 4 MM MISC      glucose blood (ACCU-CHEK GUIDE) test strip Use as instructed 100 each 12   glucose blood test strip Check your sugar in the morning before you eat breakfast, and one hour after a meal. 100 each 2   glucose monitoring kit (FREESTYLE) monitoring kit 1 each by Does not apply route daily. Check glucose once in the morning before breakfast and 1 hour after a meal 1 each 0   insulin glargine (LANTUS) 100 UNIT/ML Solostar Pen Inject 46 Units into the skin daily. 15 mL 3   methocarbamol (ROBAXIN) 500 MG tablet Take 1 tablet (500 mg total) by mouth 2 (two) times daily. 12 tablet 0   oxyCODONE-acetaminophen (PERCOCET) 5-325 MG tablet Take 1 tablet by mouth every 4 (four) hours as needed. 10 tablet 0   rosuvastatin (CRESTOR) 10  MG tablet Take 1 tablet (10 mg total) by mouth daily. 90 tablet 1   sitaGLIPtin (JANUVIA) 100 MG tablet Take 1 tablet (100 mg total) by mouth daily. 30 tablet 11   topiramate (TOPAMAX) 25 MG tablet Take 1 tab (25 mg) nightly for 1 week, then 1 tab (25 mg) twice daily for 1 week, then 1 tab (25 mg) in morning and 2 tabs (50 mg) in evening for 1 week, then  2 tabs (50 mg) twice daily (Patient not taking: Reported on 11/28/2021) 120 tablet 2   traMADol (ULTRAM) 50 MG tablet Take 1 tablet (50 mg total) by mouth every 6 (six) hours as needed. (Patient not taking: Reported on 03/09/2022) 15 tablet 0   No facility-administered medications prior to  visit.    No Known Allergies  Family History  Problem Relation Age of Onset   Healthy Mother    Healthy Father         Objective:  Objective  Vitals:   03/09/22 0851  BP: 111/78  Pulse: 86  Temp: 97.9 F (36.6 C)   Body mass index is 40.25 kg/m.   Physical Exam  Physical Exam Vitals reviewed.  Constitutional:      Appearance: Normal appearance. She is not ill-appearing.  HENT:     Mouth/Throat:     Pharynx: Oropharynx is clear.  Eyes:     Conjunctiva/sclera: Conjunctivae normal.  Cardiovascular:     Rate and Rhythm: Normal rate.     Heart sounds: No murmur heard. Pulmonary:     Effort: Pulmonary effort is normal.     Breath sounds: Normal breath sounds.  Neurological:     Mental Status: She is alert and oriented to person, place, and time.          Assessment & Plan:    Problem List Items Addressed This Visit       Unprioritized   Human immunodeficiency virus (HIV) disease ([place]) - Primary (Chronic)    Poor adherence on Genvoya reported. She does not seem highly motivated to continue daily pills for treatment and seems to have a lot of medication fatigue with other conditions. We spent time discussing LA options with Cabenuva injections every 2 months. She seemed somewhat interested in this. Will check Genotype today to at least consider the fact that she needs a drug switch to something with a better barrier like Symtuza.  I asked her to please schedule a follow up with Dr. Linus Salmons in October.       Relevant Orders   HIV RNA, RTPCR W/R GT (RTI, PI,INT)   Heavy menses   Relevant Orders   Ambulatory referral to Gynecology   Iron, TIBC and Ferritin Panel   CBC   Adenomyosis    Not a current surgical candidate d/t A1C > 12%. We spent a lot of time discussing natural progression of this and how this is the primary cause of her anemia. I think she will be a good candidate for LA-IUD for medical management. New referral placed for GYN team and information  provided to call to schedule.       Anemia    She is not sure what her hemoglobin was at last lab draw and not completely certain when last draw was. I wonder if it corresponds with the month she had 2 heavy cycles. No signs of severe anemia on exam today (normotensive, normal HR) and has no severe symptoms described. If very low I let her know she would need referral  to [facility] for blood transfusion. Will check CBC today and provide further recs.  I doubt she will take PO iron supplement consistently to make much of a difference. [facility] PCP team can help with IV iron dosing vs heme onc referral if needed to manage. Secondary to heavy menses d/t fibroids - see other problem based charting to address primary problem.       Relevant Orders   Iron, TIBC and Ferritin Panel   CBC     ADDENDUM:  Patient left without getting labs today d/t another appointment she may miss. Will have her schedule a lab visit this week so we can make progress on today's visit and concerns.    Janene Madeira, MSN, NP-C [facility] Office: [phone] Pager: [phone]

## 2022-03-09 NOTE — Assessment & Plan Note (Signed)
She is not sure what her hemoglobin was at last lab draw and not completely certain when last draw was. I wonder if it corresponds with the month she had 2 heavy cycles. No signs of severe anemia on exam today (normotensive, normal HR) and has no severe symptoms described. If very low I let her know she would need referral to [facility] for blood transfusion. Will check CBC today and provide further recs.  I doubt she will take PO iron supplement consistently to make much of a difference. [facility] PCP team can help with IV iron dosing vs heme onc referral if needed to manage. Secondary to heavy menses d/t fibroids - see other problem based charting to address primary problem.

## 2022-03-09 NOTE — Assessment & Plan Note (Signed)
Not a current surgical candidate d/t A1C > 12%. We spent a lot of time discussing natural progression of this and how this is the primary cause of her anemia. I think she will be a good candidate for LA-IUD for medical management. New referral placed for GYN team and information provided to call to schedule.

## 2022-03-09 NOTE — Assessment & Plan Note (Signed)
Poor adherence on Genvoya reported. She does not seem highly motivated to continue daily pills for treatment and seems to have a lot of medication fatigue with other conditions. We spent time discussing LA options with Cabenuva injections every 2 months. She seemed somewhat interested in this. Will check Genotype today to at least consider the fact that she needs a drug switch to something with a better barrier like Symtuza.  I asked her to please schedule a follow up with Dr. Linus Salmons in October.

## 2022-03-13 NOTE — Telephone Encounter (Signed)
Reached out to patient, she was currently at work and not able to speak much at the moment. She did mention that she not been able to follow up on any of the recommendation given to her last week when she saw S. Dixon, NP but says she is working on it. She ended the call before I was able to explain to her that she needed to schedule a lab visit and follow up with Dr. Linus Salmons in Oct. Previous Mychart message has been sent, but she not read it yet. Beverly Johnson

## 2022-03-14 LAB — POCT GLYCOSYLATED HEMOGLOBIN (HGB A1C): Hemoglobin-A1c: 10.5

## 2022-03-14 LAB — GLUCOSE, POCT (MANUAL RESULT ENTRY): POC Glucose: 383 mg/dl — AB (ref 70–99)

## 2022-04-02 ENCOUNTER — Other Ambulatory Visit: Payer: Self-pay | Admitting: Nurse Practitioner

## 2022-04-02 DIAGNOSIS — G588 Other specified mononeuropathies: Secondary | ICD-10-CM

## 2022-04-02 DIAGNOSIS — E1165 Type 2 diabetes mellitus with hyperglycemia: Secondary | ICD-10-CM

## 2022-04-02 DIAGNOSIS — M792 Neuralgia and neuritis, unspecified: Secondary | ICD-10-CM

## 2022-04-06 ENCOUNTER — Telehealth: Payer: Self-pay | Admitting: Nurse Practitioner

## 2022-04-06 NOTE — Telephone Encounter (Signed)
..   Medicaid Managed Care   Unsuccessful Outreach Note  04/06/2022 Name: Beverly Johnson MRN: [id number] DOB: [birth year]  Referred by: Teena Dunk, NP Reason for referral : High Risk Managed Medicaid (I called the patient today to get her scheduled with the MM Team. Someone picked up the phone but would never speak. )   An unsuccessful telephone outreach was attempted today. The patient was referred to the case management team for assistance with care management and care coordination.   Follow Up Plan: The care management team will reach out to the patient again over the next 14 days.   [place]

## 2022-04-29 ENCOUNTER — Other Ambulatory Visit: Payer: Self-pay | Admitting: Nurse Practitioner

## 2022-04-29 DIAGNOSIS — E1165 Type 2 diabetes mellitus with hyperglycemia: Secondary | ICD-10-CM

## 2022-04-30 ENCOUNTER — Other Ambulatory Visit: Payer: Medicaid Other

## 2022-04-30 ENCOUNTER — Other Ambulatory Visit: Payer: Self-pay

## 2022-04-30 DIAGNOSIS — N920 Excessive and frequent menstruation with regular cycle: Secondary | ICD-10-CM | POA: Diagnosis not present

## 2022-04-30 DIAGNOSIS — D649 Anemia, unspecified: Secondary | ICD-10-CM | POA: Diagnosis not present

## 2022-04-30 DIAGNOSIS — B20 Human immunodeficiency virus [HIV] disease: Secondary | ICD-10-CM | POA: Diagnosis not present

## 2022-05-01 NOTE — Progress Notes (Signed)
Non-emergent iron deficiency anemia. She came to see me 2-3 months ago at the advisement of her research team and took awhile to do labs.  I referred her previously back to PCP team for discussions on best practice management. With ARVs may do better to have her get IV Fe or at least pay careful attention to separate pills by 6 hours.  She already verbalized she would not likely take "more medicine"  She has FU with Dr. Linus Salmons in a few weeks.

## 2022-05-04 LAB — CBC
HCT: 29.7 % — ABNORMAL LOW (ref 35.0–45.0)
Hemoglobin: 8.8 g/dL — ABNORMAL LOW (ref 11.7–15.5)
MCH: 20.1 pg — ABNORMAL LOW (ref 27.0–33.0)
MCHC: 29.6 g/dL — ABNORMAL LOW (ref 32.0–36.0)
MCV: 67.8 fL — ABNORMAL LOW (ref 80.0–100.0)
MPV: 10.7 fL (ref 7.5–12.5)
Platelets: 212 10*3/uL (ref 140–400)
RBC: 4.38 10*6/uL (ref 3.80–5.10)
RDW: 15.7 % — ABNORMAL HIGH (ref 11.0–15.0)
WBC: 4.3 10*3/uL (ref 3.8–10.8)

## 2022-05-04 LAB — HIV RNA, RTPCR W/R GT (RTI, PI,INT)
HIV 1 RNA Quant: <20 copies/mL — AB
HIV-1 RNA Quant, Log: <1.3 Log copies/mL — AB

## 2022-05-04 LAB — IRON,TIBC AND FERRITIN PANEL
%SAT: 4 % (calc) — ABNORMAL LOW (ref 16–45)
Ferritin: 3 ng/mL — ABNORMAL LOW (ref 16–232)
Iron: 17 ug/dL — ABNORMAL LOW (ref 40–190)
TIBC: 456 mcg/dL (calc) — ABNORMAL HIGH (ref 250–450)

## 2022-05-13 ENCOUNTER — Encounter: Payer: Self-pay | Admitting: Internal Medicine

## 2022-05-13 ENCOUNTER — Ambulatory Visit (INDEPENDENT_AMBULATORY_CARE_PROVIDER_SITE_OTHER): Payer: Medicaid Other | Admitting: Internal Medicine

## 2022-05-13 ENCOUNTER — Other Ambulatory Visit: Payer: Self-pay

## 2022-05-13 VITALS — BP 123/86 | HR 86 | Resp 16 | Ht 67.0 in | Wt 265.0 lb

## 2022-05-13 DIAGNOSIS — Z794 Long term (current) use of insulin: Secondary | ICD-10-CM | POA: Diagnosis not present

## 2022-05-13 DIAGNOSIS — B20 Human immunodeficiency virus [HIV] disease: Secondary | ICD-10-CM

## 2022-05-13 DIAGNOSIS — Z23 Encounter for immunization: Secondary | ICD-10-CM

## 2022-05-13 DIAGNOSIS — D500 Iron deficiency anemia secondary to blood loss (chronic): Secondary | ICD-10-CM | POA: Diagnosis not present

## 2022-05-13 DIAGNOSIS — E119 Type 2 diabetes mellitus without complications: Secondary | ICD-10-CM

## 2022-05-13 DIAGNOSIS — L84 Corns and callosities: Secondary | ICD-10-CM | POA: Diagnosis not present

## 2022-05-13 DIAGNOSIS — N946 Dysmenorrhea, unspecified: Secondary | ICD-10-CM | POA: Diagnosis not present

## 2022-05-13 DIAGNOSIS — N920 Excessive and frequent menstruation with regular cycle: Secondary | ICD-10-CM

## 2022-05-13 MED ORDER — IRON (FERROUS SULFATE) 325 (65 FE) MG PO TABS: 1.0000 | ORAL_TABLET | Freq: Two times a day (BID) | ORAL | 11 refills | Status: DC

## 2022-05-14 ENCOUNTER — Encounter: Payer: Self-pay | Admitting: Internal Medicine

## 2022-05-14 ENCOUNTER — Telehealth: Payer: Self-pay

## 2022-05-14 LAB — HEMOGLOBIN A1C
Hgb A1c MFr Bld: 8.7 % of total Hgb — ABNORMAL HIGH (ref <5.7)
Mean Plasma Glucose: 203 mg/dL
eAG (mmol/L): 11.2 mmol/L

## 2022-05-14 NOTE — Progress Notes (Signed)
   Subjective:    Patient ID: Beverly Johnson, female    DOB: 1975-11-17, 46 y.o.   MRN: [id number]  HPI Beverly Johnson is here for follow up on her HIV She continues to take [facility] and denies any missed doses.  Her labs are reassuring with an undetectable viral load.  She has also been doing much better with her diabetes care, using insulin and she is hopeful she can have her calluses taken care of.  She has iron deficiency with a Hgb of 8.8 from menstrual blood loss with an iron level of 17.     Review of Systems  Constitutional:  Positive for fatigue.  Gastrointestinal:  Negative for diarrhea.  Skin:  Negative for rash.       Objective:   Physical Exam Eyes:     General: No scleral icterus. Pulmonary:     Effort: Pulmonary effort is normal.  Neurological:     Mental Status: She is alert.  Psychiatric:        Mood and Affect: Mood normal.   SH: no tobacco        Assessment & Plan:

## 2022-05-14 NOTE — Assessment & Plan Note (Signed)
She is doing well on Genvoya and good tolerance.  No changes indicated  Follow up in 6 months.

## 2022-05-14 NOTE — Telephone Encounter (Signed)
-----   Message from Thayer Headings, MD sent at 05/14/2022 10:08 AM EDT ----- Could you let her know that her Hgb A1c is MUCH improved.  I suspect it is good enough for the podiatrist who did not want to do callous surgery with her high HGBA1c previously.   thanks

## 2022-05-14 NOTE — Assessment & Plan Note (Signed)
She has refused vaccine at this time

## 2022-05-14 NOTE — Assessment & Plan Note (Signed)
Iron deficiency with mentrual blood loss.  I will start her on iron pills and referral to gynecology.

## 2022-05-14 NOTE — Assessment & Plan Note (Signed)
Her Hgb A1c is much improved and I suspect she will be able to get back to podiatry for care of her calluses.

## 2022-05-14 NOTE — Assessment & Plan Note (Signed)
I am going to refer her back to gynecology due to her heavy menses and blood loss anemia.  She is interested in definitive treatment such as hysterectomy if appropriate or other methods.

## 2022-05-19 ENCOUNTER — Telehealth: Payer: Self-pay

## 2022-05-19 NOTE — Telephone Encounter (Signed)
Patient called, states she is not tolerating the '325mg'$  of iron BID well and that it is making her queasy and nauseous. She is wondering if Dr. Linus Salmons could lower the dose.   Advised her that a referral was placed to gynecology for menorrhagia and that her gynecologist might be her best resource for managing this. She would like Dr. Henreitta Leber advice. Will route to provider.   Beryle Flock, RN

## 2022-05-25 ENCOUNTER — Other Ambulatory Visit: Payer: Self-pay | Admitting: Nurse Practitioner

## 2022-05-25 DIAGNOSIS — E1165 Type 2 diabetes mellitus with hyperglycemia: Secondary | ICD-10-CM

## 2022-05-29 ENCOUNTER — Ambulatory Visit: Payer: Medicaid Other | Admitting: Podiatry

## 2022-05-29 DIAGNOSIS — Q828 Other specified congenital malformations of skin: Secondary | ICD-10-CM

## 2022-05-29 DIAGNOSIS — Z7189 Other specified counseling: Secondary | ICD-10-CM

## 2022-05-29 NOTE — Progress Notes (Signed)
  Subjective:  Patient ID: Beverly Johnson, female    DOB: 04-Jun-1976,  MRN: [id number]  Chief Complaint  Patient presents with   porokeratosis    46 y.o. female returns for the above complaint.  Patient presents with follow-up of right porokeratosis submetatarsal 3.  Patient states it continues to be painful.  She is states she works constantly at [employer].  It hurts to ambulate.  She would like to discuss treatment options.  Her A1c has been brought down to 8.7% now  Objective:   There were no vitals filed for this visit. Podiatric Exam: Vascular: dorsalis pedis and posterior tibial pulses are palpable bilateral. Capillary return is immediate. Temperature gradient is WNL. Skin turgor WNL  Sensorium: Normal Semmes Weinstein monofilament test. Normal tactile sensation bilaterally. Nail Exam: Pt has thick disfigured discolored nails with subungual debris noted bilateral entire nail hallux through fifth toenails Ulcer Exam: There is no evidence of ulcer or pre-ulcerative changes or infection. Orthopedic Exam: Muscle tone and strength are WNL. No limitations in general ROM. No crepitus or effusions noted. HAV  B/L.  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  Pain on palpation to the lesion.  No infection or ulcers  Assessment & Plan:  Patient was evaluated and treated and all questions answered.  Porokeratosis (hyperkeratotic lesion sub-met 3) -The lesion was adequately debrided down to healthy tissue no pinpoint bleeding noted ruling out wart. -Metatarsal pad was asked to offload the forefoot -I also discussed with her the possibility of surgery however she is a diabetic with last A1c that was taken in 9.8%.  Once her A1c is below 8% we can discuss doing a floating osteotomy to give her relief while ambulating.  Patient states understanding  Encounter for diabetic education -I explained to patient the etiology of diabetes and various  treatment options were extensively discussed.  I encouraged her to come do diet control as well as follow-up with her endocrinologist as it is very imperative to bring her A1c down.  Last A1c was 13%.  Her most recent A1c is now at 9.8%.  It is trending downwards we will continue to monitor.   Boneta Lucks, DPM    No follow-ups on file.

## 2022-06-02 ENCOUNTER — Other Ambulatory Visit: Payer: Self-pay | Admitting: Internal Medicine

## 2022-06-02 DIAGNOSIS — B20 Human immunodeficiency virus [HIV] disease: Secondary | ICD-10-CM

## 2022-06-30 ENCOUNTER — Other Ambulatory Visit: Payer: Self-pay | Admitting: Nurse Practitioner

## 2022-06-30 DIAGNOSIS — M792 Neuralgia and neuritis, unspecified: Secondary | ICD-10-CM

## 2022-06-30 DIAGNOSIS — E1165 Type 2 diabetes mellitus with hyperglycemia: Secondary | ICD-10-CM

## 2022-06-30 DIAGNOSIS — G588 Other specified mononeuropathies: Secondary | ICD-10-CM

## 2022-08-12 ENCOUNTER — Other Ambulatory Visit: Payer: Self-pay

## 2022-08-13 ENCOUNTER — Encounter (HOSPITAL_COMMUNITY): Payer: Self-pay | Admitting: *Deleted

## 2022-08-13 ENCOUNTER — Ambulatory Visit (HOSPITAL_COMMUNITY)
Admission: EM | Admit: 2022-08-13 | Discharge: 2022-08-13 | Disposition: A | Discharge status: Home / Self Care | Payer: Medicaid Other | Attending: Internal Medicine | Admitting: Internal Medicine

## 2022-08-13 DIAGNOSIS — R6883 Chills (without fever): Secondary | ICD-10-CM

## 2022-08-13 DIAGNOSIS — J069 Acute upper respiratory infection, unspecified: Secondary | ICD-10-CM

## 2022-08-13 DIAGNOSIS — R52 Pain, unspecified: Secondary | ICD-10-CM

## 2022-08-13 LAB — SARS CORONAVIRUS 2 (TAT 6-24 HRS): SARS Coronavirus 2: NEGATIVE

## 2022-08-13 MED ORDER — PROMETHAZINE-DM 6.25-15 MG/5ML PO SYRP: 5.0000 mL | ORAL_SOLUTION | Freq: Every evening | ORAL | 0 refills | Status: DC | PRN

## 2022-08-13 MED ORDER — BENZONATATE 100 MG PO CAPS: 100.0000 mg | ORAL_CAPSULE | Freq: Three times a day (TID) | ORAL | 0 refills | Status: DC

## 2022-08-13 NOTE — ED Triage Notes (Signed)
Pts began on 08/10/2022 with body aches, cough. She took some OTC cough meds without relief.

## 2022-08-13 NOTE — Discharge Instructions (Addendum)
You have a viral upper respiratory infection.   Purchase mucinex (guaifenesin) '1200mg'$  and take this every 12 hours for the next few days to thin your nasal congestion and mucous so that you are able to get out of your body easier by coughing and blowing your nose. Drink plenty of water while taking this. You can use this instead of theraflu if you'd like.  Take tessalon pearles every 8 hours as needed for cough.  You may take tylenol 1,'000mg'$  and ibuprofen '600mg'$  every 6 hours with food as needed for fever/chills, sore throat, aches/pains, and inflammation associated with viral illness. Take this with food to avoid stomach upset.    You may do salt water and baking soda gargles every 4 hours as needed for your throat pain.  Please put 1 teaspoon of salt and 1/2 teaspoon of baking soda in 8 ounces of warm water then gargle and spit the water out. You may also put 1 tablespoon of honey in warm water and drink this to soothe your throat.  Place a humidifier in your room at night to help decrease dry air that can irritate your airway and cause you to have a sore throat and cough.  Please try to eat a well-balanced diet while you are sick so that your body gets proper nutrition to heal.  If you develop any new or worsening symptoms, please return.  If your symptoms are severe, please go to the emergency room.  Follow-up with your primary care provider for further evaluation and management of your symptoms as well as ongoing wellness visits.  I hope you feel better!

## 2022-08-13 NOTE — ED Provider Notes (Signed)
[place]    CSN: [id number] Arrival date & time: 08/13/22  0800      History   Chief Complaint No chief complaint on file.   HPI Beverly Johnson is a 46 y.o. female.   Patient presents urgent care for evaluation of cough, nasal congestion, sore throat, body aches, and chills that started on August 10, 2022 (3 days ago).  She was exposed to her dad on Christmas Eve the day before her symptoms started who is also experiencing bodyaches and cough.  No other known sick contacts with similar symptoms.  She is vaccinated against COVID-19 but has not received her flu vaccine this year.  Nasal congestion is significant and makes it difficult for her to breathe through her nose.  She denies chest pain, shortness of breath, weakness, wheezing, and history of chronic respiratory problems.  She is not a smoker and never has been.  Denies drug use.  She does have HIV and type 2 diabetes.  Reports the second time that she had COVID last year she was hospitalized related to virus.  No recent antibiotic or steroid use.  She states she just began taking TheraFlu over-the-counter medicine last night for symptoms and this has helped slightly.  Has not attempted use of any other over-the-counter medicines.  Tolerating food and fluids well without nausea, vomiting, headache, blurry vision, abdominal pain, dizziness, or urinary symptoms.     Past Medical History:  Diagnosis Date   DM2 (diabetes mellitus, type 2) ([place])    HIV infection ([place])     Patient Active Problem List   Diagnosis Date Noted   Anemia 03/09/2022   Leukopenia 11/25/2021   Need for prophylactic vaccination and inoculation against influenza 05/28/2021   Adenomyosis 02/24/2021   Vulvovaginal candidiasis 01/16/2021   Dysmenorrhea 01/16/2021   Callus of foot 11/26/2020   Heavy menses 11/26/2020   History of 2019 novel coronavirus disease (COVID-19) 06/16/2019   Abnormal ECG 06/16/2019   Medication monitoring encounter  11/12/2017   Breast nodule 09/02/2017   Screening examination for venereal disease 03/30/2017   Encounter for long-term (current) use of high-risk medication 03/30/2017   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/30/2017   Knee pain, chronic 03/30/2017   Vitamin D deficiency 03/21/2017   Healthcare maintenance 03/02/2014   Class 2 obesity due to excess calories with body mass index (BMI) of 38.0 to 38.9 in adult 04/27/2012   Type 2 diabetes mellitus without complications ([place]) 37/62/8315   Human immunodeficiency virus (HIV) disease ([place]) 09/29/2006    Past Surgical History:  Procedure Laterality Date   CESAREAN SECTION     x3   CHOLECYSTECTOMY  06/29/2011   Procedure: LAPAROSCOPIC CHOLECYSTECTOMY WITH INTRAOPERATIVE CHOLANGIOGRAM;  Surgeon: Merrie Roof, MD;  Location: [facility] OR;  Service: General;  Laterality: N/A;   TUBAL LIGATION      OB History     Gravida  3   Para  3   Term  3   Preterm      AB      Living  3      SAB      IAB      Ectopic      Multiple      Live Births  3        Obstetric Comments  c-section x 3. Last one with BTL done in the mid 2000s          Home Medications    Prior to  Admission medications   Medication Sig Start Date End Date Taking? Authorizing Provider  ACCU-CHEK GUIDE test strip USE AS INSTRUCTED 04/02/22  Yes Fenton Foy, NP  Accu-Chek Softclix Lancets lancets 1 each 3 (three) times daily. 02/06/21  Yes [provider]  benzonatate (TESSALON) 100 MG capsule Take 1 capsule (100 mg total) by mouth every 8 (eight) hours. 08/13/22  Yes Talbot Grumbling, FNP  GENVOYA 150-150-200-10 MG TABS tablet TAKE ONE TABLET BY MOUTH DAILY WITH BREAKFAST 06/02/22  Yes Comer, Okey Regal, MD  GLOBAL EASE INJECT PEN NEEDLES 32G X 4 MM MISC  11/15/21  Yes [provider]  glucose blood test strip Check your sugar in the morning before you eat breakfast, and one hour after a meal. 05/26/19  Yes Melynda Ripple, MD  glucose monitoring kit (FREESTYLE) monitoring kit 1 each by Does not apply route daily. Check glucose once in the morning before breakfast and 1 hour after a meal 02/05/21  Yes King, Diona Foley, NP  Insulin Glargine Solostar (LANTUS) 100 UNIT/ML Solostar Pen INJECT 46 UNITS INTO THE SKIN DAILY 04/02/22  Yes Fenton Foy, NP  promethazine-dextromethorphan (PROMETHAZINE-DM) 6.25-15 MG/5ML syrup Take 5 mLs by mouth at bedtime as needed for cough. 08/13/22  Yes Talbot Grumbling, FNP  cyclobenzaprine (FLEXERIL) 10 MG tablet Take 1 tablet (10 mg total) by mouth 3 (three) times daily as needed for muscle spasms. 11/28/21   Bo Merino I, NP  gabapentin (NEURONTIN) 300 MG capsule TAKE ONE CAPSULE BY MOUTH THREE TIMES A DAY 04/02/22   Fenton Foy, NP  glipiZIDE (GLUCOTROL) 10 MG tablet TAKE ONE TABLET BY MOUTH TWICE A DAY BEFORE MEALS 04/29/22   Fenton Foy, NP  Iron, Ferrous Sulfate, 325 (65 Fe) MG TABS Take 1 tablet by mouth 2 (two) times daily. 05/13/22   Comer, Okey Regal, MD  methocarbamol (ROBAXIN) 500 MG tablet Take 1 tablet (500 mg total) by mouth 2 (two) times daily. 02/11/22   Larene Pickett, PA-C  oxyCODONE-acetaminophen (PERCOCET) 5-325 MG tablet Take 1 tablet by mouth every 4 (four) hours as needed. 02/11/22   Larene Pickett, PA-C  rosuvastatin (CRESTOR) 10 MG tablet TAKE ONE TABLET BY MOUTH DAILY 04/02/22   Fenton Foy, NP  sitaGLIPtin (JANUVIA) 100 MG tablet Take 1 tablet (100 mg total) by mouth daily. 03/19/21   Vevelyn Francois, NP  topiramate (TOPAMAX) 25 MG tablet Take 1 tab (25 mg) nightly for 1 week, then 1 tab (25 mg) twice daily for 1 week, then 1 tab (25 mg) in morning and 2 tabs (50 mg) in evening for 1 week, then  2 tabs (50 mg) twice daily Patient taking differently: Take 1 tab (25 mg) nightly for 1 week, then 1 tab (25 mg) twice daily for 1 week, then 1 tab (25 mg) in morning and 2 tabs (50 mg) in evening for 1 week, then  2 tabs (50 mg) twice daily 10/23/21    Ghimire, Henreitta Leber, MD  traMADol (ULTRAM) 50 MG tablet Take 1 tablet (50 mg total) by mouth every 6 (six) hours as needed. 10/13/21   Chase Picket, MD  cetirizine (ZYRTEC ALLERGY) 10 MG tablet Take 1 tablet (10 mg total) by mouth daily. Patient not taking: Reported on 05/27/2020 11/26/19 11/08/20  Henderly, Britni A, PA-C    Family History Family History  Problem Relation Age of Onset   Healthy Mother    Healthy Father     Social History Social  History   Tobacco Use   Smoking status: Never   Smokeless tobacco: Never  Vaping Use   Vaping Use: Never used  Substance Use Topics   Alcohol use: No   Drug use: Not Currently    Types: Marijuana    Comment: CBD     Allergies   Patient has no known allergies.   Review of Systems Review of Systems Per HPI  Physical Exam Triage Vital Signs ED Triage Vitals  Enc Vitals Group     BP 08/13/22 0822 (!) 141/98     Pulse Rate 08/13/22 0822 80     Resp 08/13/22 0822 18     Temp 08/13/22 0822 99.4 F (37.4 C)     Temp Source 08/13/22 0822 Oral     SpO2 08/13/22 0822 98 %     Weight --      Height --      Head Circumference --      Peak Flow --      Pain Score 08/13/22 0819 0     Pain Loc --      Pain Edu? --      Excl. in [place]? --    No data found.  Updated Vital Signs BP (!) 141/98 (BP Location: Right Arm)   Pulse 80   Temp 99.4 F (37.4 C) (Oral)   Resp 18   LMP 07/22/2022 (Approximate)   SpO2 98%   Visual Acuity Right Eye Distance:   Left Eye Distance:   Bilateral Distance:    Right Eye Near:   Left Eye Near:    Bilateral Near:     Physical Exam Vitals and nursing note reviewed.  Constitutional:      Appearance: She is ill-appearing. She is not toxic-appearing.  HENT:     Head: Normocephalic and atraumatic.     Right Ear: Hearing, tympanic membrane, ear canal and external ear normal.     Left Ear: Hearing, tympanic membrane, ear canal and external ear normal.     Nose: Congestion present.      Mouth/Throat:     Lips: Pink.     Mouth: Mucous membranes are moist.     Pharynx: No posterior oropharyngeal erythema.     Comments: Clear postnasal drainage to the posterior oropharynx. Eyes:     General: Lids are normal. Vision grossly intact. Gaze aligned appropriately.        Right eye: No discharge.        Left eye: No discharge.     Extraocular Movements: Extraocular movements intact.     Conjunctiva/sclera: Conjunctivae normal.     Pupils: Pupils are equal, round, and reactive to light.  Cardiovascular:     Rate and Rhythm: Normal rate and regular rhythm.     Heart sounds: Normal heart sounds, S1 normal and S2 normal.  Pulmonary:     Effort: Pulmonary effort is normal. No respiratory distress.     Breath sounds: Normal breath sounds and air entry.  Musculoskeletal:     Cervical back: Neck supple.     Right lower leg: No edema.     Left lower leg: No edema.  Lymphadenopathy:     Cervical: Cervical adenopathy present.  Skin:    General: Skin is warm and dry.     Capillary Refill: Capillary refill takes less than 2 seconds.     Findings: No rash.  Neurological:     General: No focal deficit present.     Mental Status: She is alert  and oriented to person, place, and time. Mental status is at baseline.     Cranial Nerves: No dysarthria or facial asymmetry.  Psychiatric:        Mood and Affect: Mood normal.        Speech: Speech normal.        Behavior: Behavior normal.        Thought Content: Thought content normal.        Judgment: Judgment normal.      UC Treatments / Results  Labs (all labs ordered are listed, but only abnormal results are displayed) Labs Reviewed  SARS CORONAVIRUS 2 (TAT 6-24 HRS)    EKG   Radiology No results found.  Procedures Procedures (including critical care time)  Medications Ordered in UC Medications - No data to display  Initial Impression / Assessment and Plan / UC Course  I have reviewed the triage vital signs and the  nursing notes.  Pertinent labs & imaging results that were available during my care of the patient were reviewed by me and considered in my medical decision making (see chart for details).   Viral URI with cough Symptoms and physical exam consistent with a viral upper respiratory tract infection that will likely resolve with rest, fluids, and prescriptions for symptomatic relief. No indication for imaging today based on stable cardiopulmonary exam and hemodynamically stable vital signs.  COVID-19 PCR testing is pending.  We will call patient if this is positive.  Quarantine guidelines discussed. Currently on day 4 of symptoms and does qualify for antiviral therapy due to HIV and type 2 diabetes comorbidities.  May have molnupiravir if she test positive for COVID-19 and starts this within 5 days of symptom onset.  Tessalon Perles and Promethazine DM sent to pharmacy for symptomatic relief to be taken as prescribed.  May purchase Mucinex over-the-counter and take this as needed for nasal congestion and cough instead of TheraFlu if she finds that TheraFlu is no longer working for her. Promethazine DM cough medication may be used as needed only at bedtime due to possible drowsiness side effect (no alcohol, working, or driving while taking this advised). May use ibuprofen/tylenol over the counter for body aches, fever/chills, and overall discomfort associated with viral illness. Nonpharmacologic interventions for symptom relief provided and after visit summary below.   Strict ED/urgent care return precautions given.  Patient verbalizes understanding and agreement with plan.  Counseled patient regarding possible side effects and uses of all medications prescribed at today's visit.  Patient verbalizes understanding and agreement with plan.  All questions answered.  Patient discharged from urgent care in stable condition.       Final Clinical Impressions(s) / UC Diagnoses   Final diagnoses:  Viral URI with  cough  Body aches  Chills     Discharge Instructions      You have a viral upper respiratory infection.   Purchase mucinex (guaifenesin) 1239m and take this every 12 hours for the next few days to thin your nasal congestion and mucous so that you are able to get out of your body easier by coughing and blowing your nose. Drink plenty of water while taking this. You can use this instead of theraflu if you'd like.  Take tessalon pearles every 8 hours as needed for cough.  You may take tylenol 1,0070mand ibuprofen 60077mvery 6 hours with food as needed for fever/chills, sore throat, aches/pains, and inflammation associated with viral illness. Take this with food to avoid stomach upset.  You may do salt water and baking soda gargles every 4 hours as needed for your throat pain.  Please put 1 teaspoon of salt and 1/2 teaspoon of baking soda in 8 ounces of warm water then gargle and spit the water out. You may also put 1 tablespoon of honey in warm water and drink this to soothe your throat.  Place a humidifier in your room at night to help decrease dry air that can irritate your airway and cause you to have a sore throat and cough.  Please try to eat a well-balanced diet while you are sick so that your body gets proper nutrition to heal.  If you develop any new or worsening symptoms, please return.  If your symptoms are severe, please go to the emergency room.  Follow-up with your primary care provider for further evaluation and management of your symptoms as well as ongoing wellness visits.  I hope you feel better!     ED Prescriptions     Medication Sig Dispense Auth. Provider   benzonatate (TESSALON) 100 MG capsule Take 1 capsule (100 mg total) by mouth every 8 (eight) hours. 21 capsule Talbot Grumbling, FNP   promethazine-dextromethorphan (PROMETHAZINE-DM) 6.25-15 MG/5ML syrup Take 5 mLs by mouth at bedtime as needed for cough. 118 mL Talbot Grumbling, FNP      PDMP not  reviewed this encounter.   Joella Prince [place], [place] 08/13/22 [phone]

## 2022-09-25 ENCOUNTER — Encounter (HOSPITAL_COMMUNITY): Payer: Self-pay

## 2022-09-25 ENCOUNTER — Ambulatory Visit (HOSPITAL_COMMUNITY)
Admission: EM | Admit: 2022-09-25 | Discharge: 2022-09-25 | Disposition: A | Discharge status: Home / Self Care | Payer: Medicaid Other | Attending: Emergency Medicine | Admitting: Emergency Medicine

## 2022-09-25 DIAGNOSIS — N898 Other specified noninflammatory disorders of vagina: Secondary | ICD-10-CM | POA: Insufficient documentation

## 2022-09-25 LAB — POCT URINALYSIS DIPSTICK, ED / UC
Bilirubin Urine: NEGATIVE
Glucose, UA: 500 mg/dL — AB
Ketones, ur: NEGATIVE mg/dL
Nitrite: NEGATIVE
Protein, ur: NEGATIVE mg/dL
Specific Gravity, Urine: 1.01 (ref 1.005–1.030)
Urobilinogen, UA: 0.2 mg/dL (ref 0.0–1.0)
pH: 5.5 (ref 5.0–8.0)

## 2022-09-25 LAB — CBG MONITORING, ED: Glucose-Capillary: 388 mg/dL — ABNORMAL HIGH (ref 70–99)

## 2022-09-25 MED ORDER — CLOTRIMAZOLE 1 % EX CREA: TOPICAL_CREAM | CUTANEOUS | 0 refills | Status: DC

## 2022-09-25 NOTE — ED Triage Notes (Signed)
Pt reports vaginal itching and discharge for several days. Pt reports dysuria and would like her urine to be check.

## 2022-09-25 NOTE — ED Provider Notes (Signed)
[facility]    CSN: [id number] Arrival date & time: 09/25/22  1346     History   Chief Complaint Chief Complaint  Patient presents with   Vaginal Itching   Urinary Tract Infection    HPI Beverly Johnson is a 47 y.o. female.  Here with several day history of vaginal itching and discharge Skin around vagina is very irritated Denies known exposure to STD. Monogamous relationship LMP 1/15  Reports some dysuria. Only on the skin when she pees No frequency, urgency, flank pain, abd pain, fevers  Hx DMT2. Last glucose check was 2 weeks ago, was 240 Hx HIV  Past Medical History:  Diagnosis Date   DM2 (diabetes mellitus, type 2) ([place])    HIV infection ([place])     Patient Active Problem List   Diagnosis Date Noted   Anemia 03/09/2022   Leukopenia 11/25/2021   Need for prophylactic vaccination and inoculation against influenza 05/28/2021   Adenomyosis 02/24/2021   Vulvovaginal candidiasis 01/16/2021   Dysmenorrhea 01/16/2021   Callus of foot 11/26/2020   Heavy menses 11/26/2020   History of 2019 novel coronavirus disease (COVID-19) 06/16/2019   Abnormal ECG 06/16/2019   Medication monitoring encounter 11/12/2017   Breast nodule 09/02/2017   Screening examination for venereal disease 03/30/2017   Encounter for long-term (current) use of high-risk medication 03/30/2017   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/30/2017   Knee pain, chronic 03/30/2017   Vitamin D deficiency 03/21/2017   Healthcare maintenance 03/02/2014   Class 2 obesity due to excess calories with body mass index (BMI) of 38.0 to 38.9 in adult 04/27/2012   Type 2 diabetes mellitus without complications ([place]) 123456   Human immunodeficiency virus (HIV) disease ([place]) 09/29/2006    Past Surgical History:  Procedure Laterality Date   CESAREAN SECTION     x3   CHOLECYSTECTOMY  06/29/2011   Procedure: LAPAROSCOPIC CHOLECYSTECTOMY WITH INTRAOPERATIVE CHOLANGIOGRAM;  Surgeon:  Merrie Roof, MD;  Location: [facility] OR;  Service: General;  Laterality: N/A;   TUBAL LIGATION      OB History     Gravida  3   Para  3   Term  3   Preterm      AB      Living  3      SAB      IAB      Ectopic      Multiple      Live Births  3        Obstetric Comments  c-section x 3. Last one with BTL done in the mid 2000s          Home Medications    Prior to Admission medications   Medication Sig Start Date End Date Taking? Authorizing Provider  clotrimazole (LOTRIMIN) 1 % cream Apply to irritated area 2 times daily 09/25/22  Yes Seriyah Collison, [place], [place]  ACCU-CHEK GUIDE test strip USE AS INSTRUCTED 04/02/22   Fenton Foy, NP  Accu-Chek Softclix Lancets lancets 1 each 3 (three) times daily. 02/06/21   [provider]  benzonatate (TESSALON) 100 MG capsule Take 1 capsule (100 mg total) by mouth every 8 (eight) hours. 08/13/22   Talbot Grumbling, FNP  cyclobenzaprine (FLEXERIL) 10 MG tablet Take 1 tablet (10 mg total) by mouth 3 (three) times daily as needed for muscle spasms. 11/28/21   [place], Jake Church I, NP  gabapentin (NEURONTIN) 300 MG capsule TAKE ONE CAPSULE BY MOUTH THREE TIMES A DAY  04/02/22   Fenton Foy, NP  GENVOYA 150-150-200-10 MG TABS tablet TAKE ONE TABLET BY MOUTH DAILY WITH BREAKFAST 06/02/22   Comer, Okey Regal, MD  glipiZIDE (GLUCOTROL) 10 MG tablet TAKE ONE TABLET BY MOUTH TWICE A DAY BEFORE MEALS 04/29/22   Fenton Foy, NP  GLOBAL EASE INJECT PEN NEEDLES 32G X 4 MM MISC  11/15/21   [provider]  glucose blood test strip Check your sugar in the morning before you eat breakfast, and one hour after a meal. 05/26/19   Melynda Ripple, MD  glucose monitoring kit (FREESTYLE) monitoring kit 1 each by Does not apply route daily. Check glucose once in the morning before breakfast and 1 hour after a meal 02/05/21   Vevelyn Francois, NP  Insulin Glargine Solostar (LANTUS) 100 UNIT/ML Solostar Pen INJECT 46 UNITS INTO THE SKIN  DAILY 04/02/22   Fenton Foy, NP  Iron, Ferrous Sulfate, 325 (65 Fe) MG TABS Take 1 tablet by mouth 2 (two) times daily. 05/13/22   Comer, Okey Regal, MD  methocarbamol (ROBAXIN) 500 MG tablet Take 1 tablet (500 mg total) by mouth 2 (two) times daily. 02/11/22   Larene Pickett, PA-C  oxyCODONE-acetaminophen (PERCOCET) 5-325 MG tablet Take 1 tablet by mouth every 4 (four) hours as needed. 02/11/22   Larene Pickett, PA-C  promethazine-dextromethorphan (PROMETHAZINE-DM) 6.25-15 MG/5ML syrup Take 5 mLs by mouth at bedtime as needed for cough. 08/13/22   Talbot Grumbling, FNP  rosuvastatin (CRESTOR) 10 MG tablet TAKE ONE TABLET BY MOUTH DAILY 04/02/22   Fenton Foy, NP  sitaGLIPtin (JANUVIA) 100 MG tablet Take 1 tablet (100 mg total) by mouth daily. 03/19/21   Vevelyn Francois, NP  topiramate (TOPAMAX) 25 MG tablet Take 1 tab (25 mg) nightly for 1 week, then 1 tab (25 mg) twice daily for 1 week, then 1 tab (25 mg) in morning and 2 tabs (50 mg) in evening for 1 week, then  2 tabs (50 mg) twice daily Patient taking differently: Take 1 tab (25 mg) nightly for 1 week, then 1 tab (25 mg) twice daily for 1 week, then 1 tab (25 mg) in morning and 2 tabs (50 mg) in evening for 1 week, then  2 tabs (50 mg) twice daily 10/23/21   Ghimire, Henreitta Leber, MD  traMADol (ULTRAM) 50 MG tablet Take 1 tablet (50 mg total) by mouth every 6 (six) hours as needed. 10/13/21   Chase Picket, MD  cetirizine (ZYRTEC ALLERGY) 10 MG tablet Take 1 tablet (10 mg total) by mouth daily. Patient not taking: Reported on 05/27/2020 11/26/19 11/08/20  Henderly, Britni A, PA-C    Family History Family History  Problem Relation Age of Onset   Healthy Mother    Healthy Father     Social History Social History   Tobacco Use   Smoking status: Never   Smokeless tobacco: Never  Vaping Use   Vaping Use: Never used  Substance Use Topics   Alcohol use: No   Drug use: Not Currently    Types: Marijuana    Comment: CBD      Allergies   Patient has no known allergies.   Review of Systems Review of Systems As per HPI  Physical Exam Triage Vital Signs ED Triage Vitals [09/25/22 1414]  Enc Vitals Group     BP 136/89     Pulse Rate 97     Resp 15     Temp 98.6 F (37 C)  Temp Source Oral     SpO2 99 %     Weight      Height      Head Circumference      Peak Flow      Pain Score      Pain Loc      Pain Edu?      Excl. in [place]?    No data found.  Updated Vital Signs BP 136/89 (BP Location: Left Arm)   Pulse 97   Temp 98.6 F (37 C) (Oral)   Resp 15   LMP 08/31/2022 (Approximate)   SpO2 99%   Physical Exam Vitals and nursing note reviewed. Exam conducted with a chaperone present Minna Antis).  Constitutional:      General: She is not in acute distress. Abdominal:     Tenderness: There is no abdominal tenderness. There is no right CVA tenderness or left CVA tenderness.  Genitourinary:    Pubic Area: Rash present.     Labia:        Right: Rash present.        Left: Rash present.      Vagina: Vaginal discharge present.     Comments: Irritated skin around vagina, labia, extends to rectum Neurological:     Mental Status: She is alert.     UC Treatments / Results  Labs (all labs ordered are listed, but only abnormal results are displayed) Labs Reviewed  POCT URINALYSIS DIPSTICK, ED / UC - Abnormal; Notable for the following components:      Result Value   Glucose, UA 500 (*)    Hgb urine dipstick TRACE (*)    Leukocytes,Ua SMALL (*)    All other components within normal limits  CBG MONITORING, ED - Abnormal; Notable for the following components:   Glucose-Capillary 388 (*)    All other components within normal limits  URINE CULTURE  CERVICOVAGINAL ANCILLARY ONLY    EKG  Radiology No results found.  Procedures Procedures   Medications Ordered in UC Medications - No data to display  Initial Impression / Assessment and Plan / UC Course  I have reviewed the  triage vital signs and the nursing notes.  Pertinent labs & imaging results that were available during my care of the patient were reviewed by me and considered in my medical decision making (see chart for details).  CBG checked per patient request; 388 here Discussed taking DM medications including insulin as prescribed, follow with her primary care.   UA with trace hgb, small leuks. Likely related to vag discharge but will culture.  Irritated skin of perineum. Does not have ulceration or lesions Unsure etiology, consider yeast especially given hx DM Clotrimazole topically BID x 5 days. Aquaphor for irritation/barrier  Cytology swab is pending Recommend to call ob/gyn and set up appointment for eval Return precautions discussed. Patient agrees to plan  Final Clinical Impressions(s) / UC Diagnoses   Final diagnoses:  Vaginal discharge  Vaginal irritation     Discharge Instructions      I recommend to use the clotrimazole cream twice daily for the next 5-6 days In between you can use aquaphor to keep skin moist, act as a barrier to irritation  We will call you if anything on your swab returns positive. Please abstain from sexual intercourse until your results return.  Please call the women's clinic to set up appointment     ED Prescriptions     Medication Sig Dispense Auth. Provider   clotrimazole (LOTRIMIN)  1 % cream Apply to irritated area 2 times daily 15 g Rylin Seavey, Wells Guiles, PA-C      PDMP not reviewed this encounter.   [place] Droll, Wells Guiles, [place] 09/25/22 1505

## 2022-09-25 NOTE — Discharge Instructions (Signed)
I recommend to use the clotrimazole cream twice daily for the next 5-6 days In between you can use aquaphor to keep skin moist, act as a barrier to irritation  We will call you if anything on your swab returns positive. Please abstain from sexual intercourse until your results return.  Please call the women's clinic to set up appointment

## 2022-09-27 LAB — URINE CULTURE: Culture: >=100000 — AB

## 2022-09-28 ENCOUNTER — Telehealth (HOSPITAL_COMMUNITY): Payer: Self-pay | Admitting: Emergency Medicine

## 2022-09-28 LAB — CERVICOVAGINAL ANCILLARY ONLY
Bacterial Vaginitis (gardnerella): POSITIVE — AB
Candida Glabrata: NEGATIVE
Candida Vaginitis: POSITIVE — AB
Chlamydia: NEGATIVE
Comment: NEGATIVE
Comment: NEGATIVE
Comment: NEGATIVE
Comment: NEGATIVE
Comment: NEGATIVE
Comment: NORMAL
Neisseria Gonorrhea: NEGATIVE
Trichomonas: NEGATIVE

## 2022-09-28 MED ORDER — NITROFURANTOIN MONOHYD MACRO 100 MG PO CAPS: 100.0000 mg | ORAL_CAPSULE | Freq: Two times a day (BID) | ORAL | 0 refills | Status: DC

## 2022-09-29 ENCOUNTER — Telehealth (HOSPITAL_COMMUNITY): Payer: Self-pay | Admitting: Emergency Medicine

## 2022-09-29 MED ORDER — METRONIDAZOLE 0.75 % VA GEL: 1.0000 | Freq: Every day | VAGINAL | 0 refills | Status: AC

## 2022-09-29 MED ORDER — FLUCONAZOLE 150 MG PO TABS: 150.0000 mg | ORAL_TABLET | Freq: Once | ORAL | 0 refills | Status: AC

## 2022-10-01 DIAGNOSIS — H5213 Myopia, bilateral: Secondary | ICD-10-CM | POA: Diagnosis not present

## 2022-10-28 ENCOUNTER — Other Ambulatory Visit: Payer: Medicaid Other

## 2022-10-28 ENCOUNTER — Other Ambulatory Visit: Payer: Self-pay

## 2022-10-28 DIAGNOSIS — B20 Human immunodeficiency virus [HIV] disease: Secondary | ICD-10-CM | POA: Diagnosis not present

## 2022-10-29 LAB — T-HELPER CELL (CD4) - (RCID CLINIC ONLY)
CD4 % Helper T Cell: 34 % (ref 33–65)
CD4 T Cell Abs: 577 /uL (ref 400–1790)

## 2022-10-30 LAB — CBC WITH DIFFERENTIAL/PLATELET
Absolute Monocytes: 360 cells/uL (ref 200–950)
Basophils Absolute: 20 cells/uL (ref 0–200)
Basophils Relative: 0.4 %
Eosinophils Absolute: 40 cells/uL (ref 15–500)
Eosinophils Relative: 0.8 %
HCT: 34.7 % — ABNORMAL LOW (ref 35.0–45.0)
Hemoglobin: 10.3 g/dL — ABNORMAL LOW (ref 11.7–15.5)
Lymphs Abs: 2060 cells/uL (ref 850–3900)
MCH: 20.9 pg — ABNORMAL LOW (ref 27.0–33.0)
MCHC: 29.7 g/dL — ABNORMAL LOW (ref 32.0–36.0)
MCV: 70.4 fL — ABNORMAL LOW (ref 80.0–100.0)
MPV: 11.7 fL (ref 7.5–12.5)
Monocytes Relative: 7.2 %
Neutro Abs: 2520 cells/uL (ref 1500–7800)
Neutrophils Relative %: 50.4 %
Platelets: 212 10*3/uL (ref 140–400)
RBC: 4.93 10*6/uL (ref 3.80–5.10)
RDW: 15.6 % — ABNORMAL HIGH (ref 11.0–15.0)
Total Lymphocyte: 41.2 %
WBC: 5 10*3/uL (ref 3.8–10.8)

## 2022-10-30 LAB — HIV-1 RNA QUANT-NO REFLEX-BLD
HIV 1 RNA Quant: <20 Copies/mL — ABNORMAL HIGH
HIV-1 RNA Quant, Log: <1.3 Log cps/mL — ABNORMAL HIGH

## 2022-11-05 ENCOUNTER — Other Ambulatory Visit (HOSPITAL_COMMUNITY)
Admission: RE | Admit: 2022-11-05 | Discharge: 2022-11-05 | Disposition: A | Discharge status: Home / Self Care | Payer: Medicaid Other | Source: Ambulatory Visit | Attending: Obstetrics and Gynecology | Admitting: Obstetrics and Gynecology

## 2022-11-05 ENCOUNTER — Other Ambulatory Visit: Payer: Self-pay

## 2022-11-05 ENCOUNTER — Ambulatory Visit
Admission: RE | Admit: 2022-11-05 | Discharge: 2022-11-05 | Disposition: A | Discharge status: Home / Self Care | Payer: Medicaid Other | Source: Ambulatory Visit | Attending: Obstetrics and Gynecology | Admitting: Obstetrics and Gynecology

## 2022-11-05 ENCOUNTER — Encounter: Payer: Self-pay | Admitting: Obstetrics and Gynecology

## 2022-11-05 ENCOUNTER — Ambulatory Visit (INDEPENDENT_AMBULATORY_CARE_PROVIDER_SITE_OTHER): Payer: Medicaid Other | Admitting: Obstetrics and Gynecology

## 2022-11-05 VITALS — BP 136/89 | HR 79 | Ht 67.0 in | Wt 268.9 lb

## 2022-11-05 DIAGNOSIS — N920 Excessive and frequent menstruation with regular cycle: Secondary | ICD-10-CM | POA: Insufficient documentation

## 2022-11-05 DIAGNOSIS — Z1239 Encounter for other screening for malignant neoplasm of breast: Secondary | ICD-10-CM

## 2022-11-05 DIAGNOSIS — N8500 Endometrial hyperplasia, unspecified: Secondary | ICD-10-CM | POA: Diagnosis not present

## 2022-11-05 DIAGNOSIS — Z1231 Encounter for screening mammogram for malignant neoplasm of breast: Secondary | ICD-10-CM | POA: Diagnosis not present

## 2022-11-05 HISTORY — DX: Excessive and frequent menstruation with regular cycle: N92.0

## 2022-11-05 LAB — POCT PREGNANCY, URINE: Preg Test, Ur: NEGATIVE

## 2022-11-05 MED ORDER — TRANEXAMIC ACID 650 MG PO TABS: 1300.0000 mg | ORAL_TABLET | Freq: Three times a day (TID) | ORAL | 2 refills | Status: DC

## 2022-11-05 NOTE — Progress Notes (Signed)
ENDOMETRIAL BIOPSY      Beverly Johnson is a 47 y.o. [phone] here for endometrial biopsy.  The indications for endometrial biopsy were reviewed.  Risks of the biopsy including cramping, bleeding, infection, uterine perforation, inadequate specimen and need for additional procedures were discussed. The patient states she understands and agrees to undergo procedure today. Consent was signed. Time out was performed.   Indications: menorrhagia Urine HCG: neg  A bivalve speculum was placed into the vagina and the cervix was easily visualized and was prepped with Betadine x2. A single-toothed tenaculum was placed on the anterior lip of the cervix to stabilize it. The 3 mm pipelle was introduced into the endometrial cavity without difficulty to a depth of 13 cm, and a moderate amount of tissue was obtained and sent to pathology. This was repeated for a total of 2 passes. The instruments were removed from the patient's vagina. Minimal bleeding from the cervix at the tenaculum was noted.   The patient tolerated the procedure well. Routine post-procedure instructions were given to the patient.    Will base further management on results of biopsy.  Lynnda Shields, MD

## 2022-11-05 NOTE — Progress Notes (Signed)
   Subjective:    Patient ID: Beverly Johnson, female    DOB: 1975/10/23, 47 y.o.   MRN: [id number]  HPI 47 yo G3P3 seen to discuss heavy menses.  Pt notes several year hx of heavy cycles. Menses last 4 days and is heavy all 4 days.   Pt passes clots.  She has not tried any interventions for this condition. Pt denies excessive fatigue and has not had syncopal episodes.   Review of Systems     Objective:   Physical Exam Constitutional:      Appearance: Normal appearance. She is obese.  HENT:     Head: Normocephalic and atraumatic.  Cardiovascular:     Rate and Rhythm: Normal rate and regular rhythm.  Pulmonary:     Effort: Pulmonary effort is normal.     Breath sounds: Normal breath sounds.  Abdominal:     General: There is no distension.     Palpations: There is no mass.     Tenderness: There is no abdominal tenderness.  Genitourinary:    Comments: SSE:  cervix WNL SVE:  exam suboptimal due body habitus Neurological:     Mental Status: She is alert.    Vitals:   11/05/22 0938 11/05/22 0940  BP: (!) 144/92 136/89  Pulse: 80 79   CLINICAL DATA:  Heavy painful cycles for 2 years. Prior C-section x3. Previous bilateral tubal ligation. LMP 02/01/2021.   EXAM: TRANSABDOMINAL AND TRANSVAGINAL ULTRASOUND OF PELVIS   TECHNIQUE: Both transabdominal and transvaginal ultrasound examinations of the pelvis were performed. Transabdominal technique was performed for global imaging of the pelvis including uterus, ovaries, adnexal regions, and pelvic cul-de-sac. It was necessary to proceed with endovaginal exam following the transabdominal exam to visualize the endometrium.   COMPARISON:  None   FINDINGS: Uterus   Measurements: 14.1 x 7.4 x 7.5 centimeters = volume: 410.4 mL. The fundus is somewhat globular and hypoechoic, possibly related to myometrial fibroids. Discrete fibroids are not identified. Uterus varies in position based on scanning approach.   Endometrium    Thickness: The endometrium is difficult to visualize secondary to artifact. No focal abnormality visualized.   Right ovary   Measurements: RIGHT ovary is not visualized.   Left ovary   Measurements: 3.5 x 1.8 x 2.3 centimeters = volume: 7.4 mL. Normal appearance/no adnexal mass.   Other findings   No abnormal free fluid.   IMPRESSION: Uterus is enlarged. The fundus is somewhat globular, raising the question of fibroids. The endometrial stripe is not well seen. Given the limitations of ultrasound in this patient, consider further evaluation with MRI of the pelvis.   LEFT ovary is normal in appearance; RIGHT ovary is not visualized.          Assessment & Plan:   1. Breast screening  - MM 3D SCREENING MAMMOGRAM BILATERAL BREAST; Future  2. Menorrhagia with regular cycle Will get pelvic MRI since last ultrasound did not well characterize the uterus. While we wait will start lysteda. Endometrial biopsy done today.  F/u in 5 weeks to discuss treatment options    Griffin Basil, MD Faculty Attending, [facility]

## 2022-11-06 LAB — SURGICAL PATHOLOGY

## 2022-11-09 ENCOUNTER — Encounter (HOSPITAL_COMMUNITY): Payer: Self-pay | Admitting: Emergency Medicine

## 2022-11-09 ENCOUNTER — Ambulatory Visit (HOSPITAL_COMMUNITY)
Admission: EM | Admit: 2022-11-09 | Discharge: 2022-11-09 | Disposition: A | Discharge status: Home / Self Care | Payer: Medicaid Other | Attending: Family Medicine | Admitting: Family Medicine

## 2022-11-09 DIAGNOSIS — E1165 Type 2 diabetes mellitus with hyperglycemia: Secondary | ICD-10-CM

## 2022-11-09 DIAGNOSIS — Z3202 Encounter for pregnancy test, result negative: Secondary | ICD-10-CM

## 2022-11-09 DIAGNOSIS — Z794 Long term (current) use of insulin: Secondary | ICD-10-CM | POA: Diagnosis not present

## 2022-11-09 DIAGNOSIS — M545 Low back pain, unspecified: Secondary | ICD-10-CM | POA: Diagnosis not present

## 2022-11-09 LAB — POCT URINALYSIS DIPSTICK, ED / UC
Bilirubin Urine: NEGATIVE
Glucose, UA: >=1000 mg/dL — AB
Hgb urine dipstick: NEGATIVE
Ketones, ur: NEGATIVE mg/dL
Leukocytes,Ua: NEGATIVE
Nitrite: NEGATIVE
Protein, ur: NEGATIVE mg/dL
Specific Gravity, Urine: 1.02 (ref 1.005–1.030)
Urobilinogen, UA: 0.2 mg/dL (ref 0.0–1.0)
pH: 5.5 (ref 5.0–8.0)

## 2022-11-09 LAB — CBG MONITORING, ED: Glucose-Capillary: 301 mg/dL — ABNORMAL HIGH (ref 70–99)

## 2022-11-09 LAB — POC URINE PREG, ED: Preg Test, Ur: NEGATIVE

## 2022-11-09 MED ORDER — ACETAMINOPHEN 325 MG PO TABS
ORAL_TABLET | ORAL | Status: AC
  Filled 2022-11-09: qty 3

## 2022-11-09 MED ORDER — CYCLOBENZAPRINE HCL 10 MG PO TABS: ORAL_TABLET | ORAL | 0 refills | Status: DC

## 2022-11-09 MED ORDER — KETOROLAC TROMETHAMINE 60 MG/2ML IM SOLN
INTRAMUSCULAR | Status: AC
  Filled 2022-11-09: qty 2

## 2022-11-09 MED ORDER — ACETAMINOPHEN 325 MG PO TABS
975.0000 mg | ORAL_TABLET | Freq: Once | ORAL | Status: AC
  Administered 2022-11-09: 650 mg via ORAL

## 2022-11-09 MED ORDER — KETOROLAC TROMETHAMINE 60 MG/2ML IM SOLN
60.0000 mg | Freq: Once | INTRAMUSCULAR | Status: AC
  Administered 2022-11-09: 60 mg via INTRAMUSCULAR

## 2022-11-09 MED ORDER — MELOXICAM 15 MG PO TABS: 15.0000 mg | ORAL_TABLET | Freq: Every day | ORAL | 0 refills | Status: DC

## 2022-11-09 NOTE — Discharge Instructions (Addendum)

## 2022-11-09 NOTE — ED Triage Notes (Signed)
Pt c/o back pains since yesterday. Denies urinary or bowel problems. Denies falls, injury or lifting. Took Ibuprofen 400mg  without relief.

## 2022-11-11 ENCOUNTER — Encounter: Payer: Self-pay | Admitting: Internal Medicine

## 2022-11-11 ENCOUNTER — Emergency Department (HOSPITAL_COMMUNITY): Payer: Medicaid Other

## 2022-11-11 ENCOUNTER — Ambulatory Visit (INDEPENDENT_AMBULATORY_CARE_PROVIDER_SITE_OTHER): Payer: Medicaid Other | Admitting: Internal Medicine

## 2022-11-11 ENCOUNTER — Emergency Department (HOSPITAL_COMMUNITY)
Admission: EM | Admit: 2022-11-11 | Discharge: 2022-11-11 | Disposition: A | Discharge status: Home / Self Care | Payer: Medicaid Other | Attending: Emergency Medicine | Admitting: Emergency Medicine

## 2022-11-11 ENCOUNTER — Encounter (HOSPITAL_COMMUNITY): Payer: Self-pay

## 2022-11-11 ENCOUNTER — Other Ambulatory Visit: Payer: Self-pay

## 2022-11-11 VITALS — BP 134/83 | HR 101 | Temp 97.8°F | Ht 67.0 in | Wt 275.0 lb

## 2022-11-11 DIAGNOSIS — N12 Tubulo-interstitial nephritis, not specified as acute or chronic: Secondary | ICD-10-CM | POA: Diagnosis not present

## 2022-11-11 DIAGNOSIS — Z21 Asymptomatic human immunodeficiency virus [HIV] infection status: Secondary | ICD-10-CM | POA: Diagnosis not present

## 2022-11-11 DIAGNOSIS — Z794 Long term (current) use of insulin: Secondary | ICD-10-CM | POA: Insufficient documentation

## 2022-11-11 DIAGNOSIS — Z79899 Other long term (current) drug therapy: Secondary | ICD-10-CM | POA: Insufficient documentation

## 2022-11-11 DIAGNOSIS — B20 Human immunodeficiency virus [HIV] disease: Secondary | ICD-10-CM | POA: Diagnosis not present

## 2022-11-11 DIAGNOSIS — M545 Low back pain, unspecified: Secondary | ICD-10-CM

## 2022-11-11 DIAGNOSIS — N920 Excessive and frequent menstruation with regular cycle: Secondary | ICD-10-CM | POA: Diagnosis not present

## 2022-11-11 DIAGNOSIS — M549 Dorsalgia, unspecified: Secondary | ICD-10-CM | POA: Insufficient documentation

## 2022-11-11 DIAGNOSIS — R109 Unspecified abdominal pain: Secondary | ICD-10-CM | POA: Insufficient documentation

## 2022-11-11 DIAGNOSIS — Z113 Encounter for screening for infections with a predominantly sexual mode of transmission: Secondary | ICD-10-CM

## 2022-11-11 DIAGNOSIS — E119 Type 2 diabetes mellitus without complications: Secondary | ICD-10-CM | POA: Insufficient documentation

## 2022-11-11 LAB — URINALYSIS, ROUTINE W REFLEX MICROSCOPIC
Bilirubin Urine: NEGATIVE
Glucose, UA: >=500 mg/dL — AB
Hgb urine dipstick: NEGATIVE
Ketones, ur: NEGATIVE mg/dL
Nitrite: POSITIVE — AB
Protein, ur: NEGATIVE mg/dL
Specific Gravity, Urine: 1.035 — ABNORMAL HIGH (ref 1.005–1.030)
pH: 5 (ref 5.0–8.0)

## 2022-11-11 LAB — PREGNANCY, URINE: Preg Test, Ur: NEGATIVE

## 2022-11-11 MED ORDER — METHOCARBAMOL 500 MG PO TABS
500.0000 mg | ORAL_TABLET | Freq: Once | ORAL | Status: AC
  Administered 2022-11-11: 500 mg via ORAL
  Filled 2022-11-11: qty 1

## 2022-11-11 MED ORDER — OXYCODONE HCL 5 MG PO TABS
5.0000 mg | ORAL_TABLET | Freq: Once | ORAL | Status: AC
  Administered 2022-11-11: 5 mg via ORAL
  Filled 2022-11-11: qty 1

## 2022-11-11 MED ORDER — CIPROFLOXACIN HCL 500 MG PO TABS: 500.0000 mg | ORAL_TABLET | Freq: Two times a day (BID) | ORAL | 0 refills | Status: AC

## 2022-11-11 MED ORDER — OXYCODONE-ACETAMINOPHEN 5-325 MG PO TABS
1.0000 | ORAL_TABLET | ORAL | Status: DC | PRN
  Administered 2022-11-11: 1 via ORAL
  Filled 2022-11-11: qty 1

## 2022-11-11 MED ORDER — ACETAMINOPHEN 500 MG PO TABS
1000.0000 mg | ORAL_TABLET | Freq: Once | ORAL | Status: AC
  Administered 2022-11-11: 1000 mg via ORAL
  Filled 2022-11-11: qty 2

## 2022-11-11 MED ORDER — KETOROLAC TROMETHAMINE 60 MG/2ML IM SOLN
15.0000 mg | Freq: Once | INTRAMUSCULAR | Status: AC
  Administered 2022-11-11: 15 mg via INTRAMUSCULAR
  Filled 2022-11-11: qty 2

## 2022-11-11 MED ORDER — LIDOCAINE 5 % EX PTCH: 1.0000 | MEDICATED_PATCH | CUTANEOUS | 0 refills | Status: DC

## 2022-11-11 MED ORDER — CIPROFLOXACIN HCL 500 MG PO TABS
500.0000 mg | ORAL_TABLET | Freq: Once | ORAL | Status: AC
  Administered 2022-11-11: 500 mg via ORAL
  Filled 2022-11-11: qty 1

## 2022-11-11 MED ORDER — DIAZEPAM 2 MG PO TABS
2.0000 mg | ORAL_TABLET | Freq: Once | ORAL | Status: AC
  Administered 2022-11-11: 2 mg via ORAL
  Filled 2022-11-11: qty 1

## 2022-11-11 MED ORDER — DIAZEPAM 2 MG PO TABS: 2.0000 mg | ORAL_TABLET | Freq: Three times a day (TID) | ORAL | 0 refills | Status: AC | PRN

## 2022-11-11 MED ORDER — LIDOCAINE 5 % EX PTCH
1.0000 | MEDICATED_PATCH | CUTANEOUS | Status: DC
  Administered 2022-11-11: 1 via TRANSDERMAL
  Filled 2022-11-11: qty 1

## 2022-11-11 NOTE — ED Provider Notes (Signed)
[place] [facility] Provider [facility]   CSN: [id number] Arrival date & time: 11/11/22  1447     History  Chief Complaint  Patient presents with   Back Pain    Beverly Johnson is a 47 y.o. female with HIV on Genvoya, T2DM, obesity, anemia who presents with back pain.  Patient was seen at urgent care on 3/25 for acute bilateral lower back pain, had negative pregnancy and UA, felt much imrpoved after tylenol/toradol and was DC'd w/ meloxicam and cyclobenzaprine.   Today, patient reports the same pain as 3/25 worsening. Took her meloxicam and tylenol and flexeril today without improvement. Radiates around her bialteral sides to her pubic bone. Denies urinary symptoms, vaginal symptoms, diarrhea/constipation, melena/hematochezia, abdominal pain, SOB, CP, leg swelling ,numbness/tingling, lower extremity weakness, saddle anesthesia, urinary/bowel incontinence/retention, h/o IVDU, h/o malignancy, falls/trauma, f/c. No radicular pain. Difficult to walk d/t pain.     Back Pain      Home Medications Prior to Admission medications   Medication Sig Start Date End Date Taking? Authorizing Provider  ACCU-CHEK GUIDE test strip USE AS INSTRUCTED 04/02/22   Ivonne AndrewNichols, Tonya S, NP  Accu-Chek Softclix Lancets lancets 1 each 3 (three) times daily. 02/06/21   [provider]  cyclobenzaprine (FLEXERIL) 10 MG tablet Take 1 tablet by mouth 3 times daily as needed for muscle spasm. Warning: May cause drowsiness. 11/09/22   Mardella LaymanHagler, Brian, MD  GENVOYA 150-150-200-10 MG TABS tablet TAKE ONE TABLET BY MOUTH DAILY WITH BREAKFAST 06/02/22   Comer, Belia Hemanobert W, MD  GLOBAL EASE INJECT PEN NEEDLES 32G X 4 MM MISC  11/15/21   [provider]  glucose blood test strip Check your sugar in the morning before you eat breakfast, and one hour after a meal. 05/26/19   Domenick GongMortenson, Ashley, MD  glucose monitoring kit (FREESTYLE) monitoring kit 1 each by Does not apply route daily.  Check glucose once in the morning before breakfast and 1 hour after a meal 02/05/21   Barbette MerinoKing, Crystal M, NP  Insulin Glargine Solostar (LANTUS) 100 UNIT/ML Solostar Pen INJECT 46 UNITS INTO THE SKIN DAILY 04/02/22   Ivonne AndrewNichols, Tonya S, NP  tranexamic acid (LYSTEDA) 650 MG TABS tablet Take 2 tablets (1,300 mg total) by mouth 3 (three) times daily. Take during menses for a maximum of five days 11/05/22   Warden FillersBass, Lawrence A, MD  cetirizine (ZYRTEC ALLERGY) 10 MG tablet Take 1 tablet (10 mg total) by mouth daily. Patient not taking: Reported on 05/27/2020 11/26/19 11/08/20  Henderly, Britni A, PA-C      Allergies    Patient has no known allergies.    Review of Systems   Review of Systems  Musculoskeletal:  Positive for back pain.   Review of systems Negative for f/c.  A 10 point review of systems was performed and is negative unless otherwise reported in HPI.  Physical Exam Updated Vital Signs BP (!) 120/90 (BP Location: Left Arm)   Pulse (!) 102   Temp 98.1 F (36.7 C) (Oral)   Resp 18   Ht 5\' 7"  (1.702 m)   Wt 124.7 kg   LMP 10/27/2022 (Approximate)   SpO2 100%   BMI 43.07 kg/m  Physical Exam General: Normal appearing female, lying in bed.  HEENT: Sclera anicteric, MMM, trachea midline.  Cardiology: RRR, no murmurs/rubs/gallops. BL radial and DP pulses equal bilaterally.  Resp: Normal respiratory rate and effort. CTAB, no wheezes, rhonchi, crackles.  Abd: Soft, non-tender, non-distended. No rebound tenderness or  guarding.  GU: Deferred. MSK: No peripheral edema or signs of trauma. Extremities without deformity or TTP. No cyanosis or clubbing. Skin: warm, dry. No rashes or lesions. Back: Diffuse TTP all throughout bilateral thoracic and lumbar back including in midline. No midline stepoffs or deformities palpated. No gross signs of trauma or abnormalities.  Neuro: A&Ox4, CNs II-XII grossly intact. 5/5 strength in all extremities. Sensation grossly intact.  Psych: Normal mood and affect.    ED Results / Procedures / Treatments   Labs (all labs ordered are listed, but only abnormal results are displayed) Labs Reviewed  URINALYSIS, ROUTINE W REFLEX MICROSCOPIC - Abnormal; Notable for the following components:      Result Value   APPearance CLOUDY (*)    Specific Gravity, Urine 1.035 (*)    Glucose, UA >=500 (*)    Nitrite POSITIVE (*)    Leukocytes,Ua MODERATE (*)    Bacteria, UA MANY (*)    All other components within normal limits  PREGNANCY, URINE    EKG None  Radiology XR lumbar spine: Negative  CT renal stone: 1. No renal or ureteral stone or obstruction. 2. Atelectasis in the lung bases. 3. Calcified uterine fibroid. 4. No evidence of bowel obstruction or inflammation.  Procedures Procedures    Medications Ordered in ED Medications  lidocaine (LIDODERM) 5 % 1 patch (1 patch Transdermal Patch Applied 11/11/22 1839)  oxyCODONE (Oxy IR/ROXICODONE) immediate release tablet 5 mg (5 mg Oral Given 11/11/22 1838)  acetaminophen (TYLENOL) tablet 1,000 mg (1,000 mg Oral Given 11/11/22 1838)  ketorolac (TORADOL) injection 15 mg (15 mg Intramuscular Given 11/11/22 1838)  methocarbamol (ROBAXIN) tablet 500 mg (500 mg Oral Given 11/11/22 1838)    ED Course/ Medical Decision Making/ A&P                          Medical Decision Making Amount and/or Complexity of Data Reviewed Labs: ordered. Decision-making details documented in ED Course. Radiology: ordered. Decision-making details documented in ED Course.  Risk OTC drugs. Prescription drug management.    This patient presents to the ED for concern of back pain, this involves an extensive number of treatment options, and is a complaint that carries with it a high risk of complications and morbidity.  I considered the following differential and admission for this acute, potentially life threatening condition.   MDM:    DDX for low back pain includes but is not limited to:   Consider MSK pain as  presenting etiology and back strain. No radicular pain to suggest sciatica. No back pain red flags on history or physical. Presentation not consistent with malignancy (lack of history of malignancy, lack of B symptoms), fracture (no trauma), cauda equina (no bowel or urinary incontinence/retention, no saddle anesthesia, no distal weakness), viscus perforation, osteomyelitis or epidural abscess (no IVDU), renal colic, pyelonephritis (afebrile, no CVAT, no urinary symptoms).  Patient is experiencing some midline tenderness palpation and will obtain a lumbar x-ray.  Given her radiation around in her pelvis will also obtain UA and pregnancy test, which are negative and reassuring.  She has no abdominal tenderness palpation and a completely benign abdominal exam, no report of abdominal pain, low concern for intra-abdominal cause of pain.  No vaginal symptoms to suggest gynecologic etiology of her symptoms.  Will treat with analgesia and reassess.   Clinical Course as of 11/23/22 Nida [id number]  Wed Nov 11, 2022  1842 Preg Test, Ur: NEGATIVE [HN]  1906 Urinalysis, Routine w reflex  microscopic -Urine, Clean Catch(!) Squam is 6-10 but given symptoms, nitrite positive, many bacteria/leuks and RBCs, +UTI, will order culture [HN]  1907 Given severity of back pain will scan for renal stone [HN]  2027 DG Lumbar Spine 2-3 Views FINDINGS: There is no evidence of lumbar spine fracture. Alignment is normal. Intervertebral disc spaces are maintained.  IMPRESSION: Negative.   [HN]  2124 CT Renal Stone Study 1. No renal or ureteral stone or obstruction. 2. Atelectasis in the lung bases. 3. Calcified uterine fibroid. 4. No evidence of bowel obstruction or inflammation.   [HN]    Clinical Course User Index [HN] Loetta Rough, MD    Labs: I Ordered, and personally interpreted labs.  The pertinent results include: Those listed above  Imaging Studies ordered: I ordered imaging studies including lumbar x-ray I  independently visualized and interpreted imaging. I agree with the radiologist interpretation  Additional history obtained from chart review.    Reevaluation: After the interventions noted above, I reevaluated the patient and found that they have :improved  Social Determinants of Health: Patient lives independently   Disposition:  On reevaluation, patient had improved after analgesia. Negative lumbar XR and CT renal stone. Patient is informed of her uterine fibroid. Her urine is marginally positive but given patient's symptoms and location of pain will treat her for pyelonephritis with ciprofloxacin. Will also prescribe lidocaine patches which patient said improved her pain today. Instructed to f/u with PCP in 1-2 weeks. DC w/ discharge instructions/return precautions. All questions answered to patient's satisfaction.     Co morbidities that complicate the patient evaluation  Past Medical History:  Diagnosis Date   DM2 (diabetes mellitus, type 2) (HCC)    HIV infection (HCC)      Medicines Meds ordered this encounter  Medications   DISCONTD: oxyCODONE-acetaminophen (PERCOCET/ROXICET) 5-325 MG per tablet 1 tablet   oxyCODONE (Oxy IR/ROXICODONE) immediate release tablet 5 mg   acetaminophen (TYLENOL) tablet 1,000 mg   ketorolac (TORADOL) injection 15 mg   methocarbamol (ROBAXIN) tablet 500 mg   lidocaine (LIDODERM) 5 % 1 patch    I have reviewed the patients home medicines and have made adjustments as needed  Problem List / ED Course: Problem List Items Addressed This Visit       Other   Back pain - Primary   Other Visit Diagnoses     Pyelonephritis                       This note was created using dictation software, which may contain spelling or grammatical errors.    Loetta Rough, MD 11/23/22 [phone]

## 2022-11-11 NOTE — Assessment & Plan Note (Signed)
Acute back pain. I have advised evaluation in the ED. To go now.

## 2022-11-11 NOTE — ED Provider Notes (Signed)
[place]   [id number] 11/09/22 Arrival Time: [id number]  ASSESSMENT & PLAN:  1. Acute bilateral low back pain without sciatica   2. Type 2 diabetes mellitus with hyperglycemia, with long-term current use of insulin (HCC)    Initially appeared to be in significant pain. Much less so after Tylenol/Toradol here. Feels comfortable with home observation. Will start muscle relaxer.  Able to ambulate here and hemodynamically stable. No indication for imaging of back at this time given no trauma and normal neurological exam. Discussed.  Reviewed below labs with pt. Is taking medications as directed. Labs Reviewed  POCT URINALYSIS DIPSTICK, ED / UC - Abnormal; Notable for the following components:      Result Value   Glucose, UA >=1000 (*)    All other components within normal limits  CBG MONITORING, ED - Abnormal; Notable for the following components:   Glucose-Capillary 301 (*)    All other components within normal limits  POC URINE PREG, ED   No s/s of DKA.  Meds ordered this encounter  Medications   ketorolac (TORADOL) injection 60 mg   acetaminophen (TYLENOL) tablet 975 mg   cyclobenzaprine (FLEXERIL) 10 MG tablet    Sig: Take 1 tablet by mouth 3 times daily as needed for muscle spasm. Warning: May cause drowsiness.    Dispense:  21 tablet    Refill:  0   meloxicam (MOBIC) 15 MG tablet    Sig: Take 1 tablet (15 mg total) by mouth daily.    Dispense:  7 tablet    Refill:  0   Work/school excuse note: provided. Medication sedation precautions given. Encourage ROM/movement as tolerated.  Recommend:  Follow-up Information     Bo Merino I, NP.   Specialty: Nurse Practitioner Why: As needed. Contact information: [place] [address] [phone]         [facility].   Specialty: Emergency Medicine Why: If symptoms worsen in any way. Contact information: [address] [id number] [place] [phone]                Reviewed expectations re: course of current medical issues. Questions answered. Outlined signs and symptoms indicating need for more acute intervention. Patient verbalized understanding. After Visit Summary given.   SUBJECTIVE: History from: patient.  Beverly Johnson is a 47 y.o. female who presents with complaint of persistent bilateral back pain; "hurst all over". Onset gradual. First noted yesterday. Was able to sleep after taking ibuprofen last evening. Injury/trama: no. History of back problems requiring medical care: occasional. Pain described as burning and without radiation. Aggravating factors: certain movements. Alleviating factors: have not been identified. Progressive LE weakness or saddle anesthesia: none. Extremity sensation changes or weakness: none. Ambulatory without difficulty. Normal bowel/bladder habits: yes; without urinary retention. Normal PO intake without n/v. No associated abdominal pain/n/v.  No tx today.   OBJECTIVE:  Vitals:   11/09/22 1129  BP: 134/87  Pulse: 88  Resp: 20  Temp: (!) 97.5 F (36.4 C)  TempSrc: Oral  SpO2: 99%    General appearance: alert; no distress but appears to be in pain HEENT: [place]; AT Neck: supple with FROM; without midline tenderness CV: regular Lungs: unlabored respirations; speaks full sentences without difficulty Abdomen: soft, non-tender; non-distended Back: reported moderate and poorly localized tenderness to palpation over bilateral thoracic and lumbar musculature ; FROM at waist; bruising: none; without midline tenderness Extremities: without edema; symmetrical without gross  deformities; normal ROM of bilateral LE Skin: warm and dry Neurologic: normal gait; normal sensation and strength of bilateral LE Psychological: alert and cooperative; normal mood and affect  Labs: Results for orders placed or performed during the hospital  encounter of 11/09/22  POC Urinalysis dipstick  Result Value Ref Range   Glucose, UA >=1000 (A) NEGATIVE mg/dL   Bilirubin Urine NEGATIVE NEGATIVE   Ketones, ur NEGATIVE NEGATIVE mg/dL   Specific Gravity, Urine 1.020 1.005 - 1.030   Hgb urine dipstick NEGATIVE NEGATIVE   pH 5.5 5.0 - 8.0   Protein, ur NEGATIVE NEGATIVE mg/dL   Urobilinogen, UA 0.2 0.0 - 1.0 mg/dL   Nitrite NEGATIVE NEGATIVE   Leukocytes,Ua NEGATIVE NEGATIVE  POC urine pregnancy  Result Value Ref Range   Preg Test, Ur NEGATIVE NEGATIVE  POC CBG monitoring  Result Value Ref Range   Glucose-Capillary 301 (H) 70 - 99 mg/dL   Labs Reviewed  POCT URINALYSIS DIPSTICK, ED / UC - Abnormal; Notable for the following components:      Result Value   Glucose, UA >=1000 (*)    All other components within normal limits  CBG MONITORING, ED - Abnormal; Notable for the following components:   Glucose-Capillary 301 (*)    All other components within normal limits  POC URINE PREG, ED    No Known Allergies  Past Medical History:  Diagnosis Date   DM2 (diabetes mellitus, type 2) (HCC)    HIV infection ([place])    Social History   Socioeconomic History   Marital status: Married    Spouse name: Not on file   Number of children: Not on file   Years of education: 10   Highest education level: Not on file  Occupational History    Employer: UNEMPLOYED  Tobacco Use   Smoking status: Never   Smokeless tobacco: Never  Vaping Use   Vaping Use: Never used  Substance and Sexual Activity   Alcohol use: No   Drug use: Not Currently    Types: Marijuana    Comment: CBD   Sexual activity: Yes    Partners: Male  Other Topics Concern   Not on file  Social History Narrative   Not on file   Social Determinants of Health   Financial Resource Strain: Not on file  Food Insecurity: No Food Insecurity (02/24/2021)   Hunger Vital Sign    Worried About Running Out of Food in the Last Year: Never true    Ran Out of Food in the Last  Year: Never true  Transportation Needs: No Transportation Needs (02/24/2021)   PRAPARE - Hydrologist (Medical): No    Lack of Transportation (Non-Medical): No  Physical Activity: Not on file  Stress: Not on file  Social Connections: Not on file  Intimate Partner Violence: Not on file   Family History  Problem Relation Age of Onset   Healthy Mother    Healthy Father    Breast cancer Neg Hx    Past Surgical History:  Procedure Laterality Date   CESAREAN SECTION     x3   CHOLECYSTECTOMY  06/29/2011   Procedure: LAPAROSCOPIC CHOLECYSTECTOMY WITH INTRAOPERATIVE CHOLANGIOGRAM;  Surgeon: Merrie Roof, MD;  Location: [place];  Service: General;  Laterality: N/A;   TUBAL Ezequiel Essex, MD 11/11/22 [phone]

## 2022-11-11 NOTE — ED Triage Notes (Signed)
Pt reports she went to UC yesterday d/t pain. She got a muscle relaxer and another pain medication. Pt reports medications aren't helping. No known injuries. Pain ongoing x 3 days. Pt reports pain is generalized to her back and radiating into bilateral groin areas. Pain described as throbbing.

## 2022-11-11 NOTE — Discharge Instructions (Addendum)
Thank you for coming to [facility] Emergency Department. You were seen for back pain. We did an exam, labs, and imaging, and these showed kidney infection (pyelonephritis) and back pain/muscle spasms. We have prescribed ciprofloxacin (antibiotic) for your infection to take twice per day for 7 days, and valium to take every 8 hours as needed for your back spasms. You can alternate taking Tylenol and ibuprofen as needed for pain. You can take 650mg  tylenol (acetaminophen) every 4-6 hours, and 600 mg ibuprofen 3 times a day. You can also use lidocaine patches prescribed once every 24 hours.  Please follow up with your primary care provider within 1 week.   Return to the ED if you develop any of the following: - Fever (100.4 F or 38 C) or chills at home that do not respond to over the counter medications - Weakness, numbness, or tingling in your extremities - Difficulty emptying bladder / urinary incontinence - Fecal incontinence - Uncontrolled nausea/vomiting with inability to keep down liquids - Feeling as though you are going to pass out or passing out - Anything else that concerns you

## 2022-11-11 NOTE — Assessment & Plan Note (Signed)
She is doing great from HIV standpoint and no changes indicated.  Continue with Genvoya and refills sent.   Follow up in 6 months.

## 2022-11-11 NOTE — Progress Notes (Signed)
   Subjective:    Patient ID: Beverly Johnson, female    DOB: 1975/08/20, 47 y.o.   MRN: [id number]  HPI Beverly Johnson is here for follow up of HIV She continues on Genvoya with no missed doses.  No issues with getting or taking her medication.  Followed by gynecology for heavy menses.   Her main complaint today is back pain.  She was seen in urgent care recently and improved with supportive care.  No blood in UA then.  Asking about being admitted to the hospital.    Review of Systems  Constitutional:  Negative for fatigue.  Gastrointestinal:  Negative for diarrhea.  Musculoskeletal:  Positive for back pain.  Skin:  Negative for rash.       Objective:   Physical Exam Eyes:     General: No scleral icterus. Pulmonary:     Effort: Pulmonary effort is normal.  Neurological:     Mental Status: She is alert.   SH: no tobacco        Assessment & Plan:

## 2022-11-11 NOTE — Assessment & Plan Note (Signed)
Now under the care of gynecology

## 2022-11-12 ENCOUNTER — Other Ambulatory Visit: Payer: Self-pay

## 2022-11-12 ENCOUNTER — Emergency Department (HOSPITAL_COMMUNITY)
Admission: EM | Admit: 2022-11-12 | Discharge: 2022-11-13 | Disposition: A | Discharge status: Home / Self Care | Payer: Medicaid Other | Attending: Emergency Medicine | Admitting: Emergency Medicine

## 2022-11-12 DIAGNOSIS — R590 Localized enlarged lymph nodes: Secondary | ICD-10-CM | POA: Insufficient documentation

## 2022-11-12 DIAGNOSIS — R109 Unspecified abdominal pain: Secondary | ICD-10-CM | POA: Diagnosis not present

## 2022-11-12 DIAGNOSIS — M545 Low back pain, unspecified: Secondary | ICD-10-CM | POA: Insufficient documentation

## 2022-11-12 DIAGNOSIS — M79603 Pain in arm, unspecified: Secondary | ICD-10-CM | POA: Diagnosis not present

## 2022-11-12 DIAGNOSIS — R739 Hyperglycemia, unspecified: Secondary | ICD-10-CM | POA: Diagnosis not present

## 2022-11-12 DIAGNOSIS — M549 Dorsalgia, unspecified: Secondary | ICD-10-CM | POA: Diagnosis not present

## 2022-11-12 NOTE — ED Triage Notes (Signed)
Patient BIB EMS for evaluation of generalized back x several days.  Has been seen at Urgent Care and [facility] ED for same symptoms.  Was prescribed "patches" and was unable to get them.  No reports of injury. Took Tylenol tonight with pain relief. EMS reports patient and husband want patient admitted due to pain.

## 2022-11-13 ENCOUNTER — Emergency Department (HOSPITAL_COMMUNITY): Payer: Medicaid Other

## 2022-11-13 DIAGNOSIS — M545 Low back pain, unspecified: Secondary | ICD-10-CM | POA: Diagnosis not present

## 2022-11-13 DIAGNOSIS — R109 Unspecified abdominal pain: Secondary | ICD-10-CM | POA: Diagnosis not present

## 2022-11-13 LAB — COMPREHENSIVE METABOLIC PANEL
ALT: 15 U/L (ref 0–44)
AST: 20 U/L (ref 15–41)
Albumin: 3.1 g/dL — ABNORMAL LOW (ref 3.5–5.0)
Alkaline Phosphatase: 84 U/L (ref 38–126)
Anion gap: 5 (ref 5–15)
BUN: 10 mg/dL (ref 6–20)
CO2: 23 mmol/L (ref 22–32)
Calcium: 8.3 mg/dL — ABNORMAL LOW (ref 8.9–10.3)
Chloride: 103 mmol/L (ref 98–111)
Creatinine, Ser: 0.76 mg/dL (ref 0.44–1.00)
GFR, Estimated: >60 mL/min (ref >60)
Glucose, Bld: 274 mg/dL — ABNORMAL HIGH (ref 70–99)
Potassium: 3.6 mmol/L (ref 3.5–5.1)
Sodium: 131 mmol/L — ABNORMAL LOW (ref 135–145)
Total Bilirubin: 0.5 mg/dL (ref 0.3–1.2)
Total Protein: 7.1 g/dL (ref 6.5–8.1)

## 2022-11-13 LAB — URINALYSIS, W/ REFLEX TO CULTURE (INFECTION SUSPECTED)
Bilirubin Urine: NEGATIVE
Glucose, UA: >=500 mg/dL — AB
Hgb urine dipstick: NEGATIVE
Ketones, ur: NEGATIVE mg/dL
Nitrite: NEGATIVE
Protein, ur: NEGATIVE mg/dL
Specific Gravity, Urine: 1.025 (ref 1.005–1.030)
pH: 7 (ref 5.0–8.0)

## 2022-11-13 LAB — CBC WITH DIFFERENTIAL/PLATELET
Abs Immature Granulocytes: 0.02 10*3/uL (ref 0.00–0.07)
Basophils Absolute: 0 10*3/uL (ref 0.0–0.1)
Basophils Relative: 0 %
Eosinophils Absolute: 0 10*3/uL (ref 0.0–0.5)
Eosinophils Relative: 1 %
HCT: 31.2 % — ABNORMAL LOW (ref 36.0–46.0)
Hemoglobin: 9.1 g/dL — ABNORMAL LOW (ref 12.0–15.0)
Immature Granulocytes: 1 %
Lymphocytes Relative: 39 %
Lymphs Abs: 1.6 10*3/uL (ref 0.7–4.0)
MCH: 21.3 pg — ABNORMAL LOW (ref 26.0–34.0)
MCHC: 29.2 g/dL — ABNORMAL LOW (ref 30.0–36.0)
MCV: 72.9 fL — ABNORMAL LOW (ref 80.0–100.0)
Monocytes Absolute: 0.3 10*3/uL (ref 0.1–1.0)
Monocytes Relative: 8 %
Neutro Abs: 2.1 10*3/uL (ref 1.7–7.7)
Neutrophils Relative %: 51 %
Platelets: 163 10*3/uL (ref 150–400)
RBC: 4.28 MIL/uL (ref 3.87–5.11)
RDW: 14.8 % (ref 11.5–15.5)
WBC: 4.1 10*3/uL (ref 4.0–10.5)
nRBC: 0 % (ref 0.0–0.2)

## 2022-11-13 LAB — PREGNANCY, URINE: Preg Test, Ur: NEGATIVE

## 2022-11-13 LAB — LIPASE, BLOOD: Lipase: 48 U/L (ref 11–51)

## 2022-11-13 MED ORDER — IOHEXOL 300 MG/ML  SOLN
100.0000 mL | Freq: Once | INTRAMUSCULAR | Status: AC | PRN
  Administered 2022-11-13: 100 mL via INTRAVENOUS

## 2022-11-13 MED ORDER — METHOCARBAMOL 1000 MG/10ML IJ SOLN
500.0000 mg | Freq: Once | INTRAVENOUS | Status: AC
  Administered 2022-11-13: 500 mg via INTRAVENOUS
  Filled 2022-11-13: qty 500

## 2022-11-13 MED ORDER — SODIUM CHLORIDE (PF) 0.9 % IJ SOLN
INTRAMUSCULAR | Status: AC
  Filled 2022-11-13: qty 50

## 2022-11-13 MED ORDER — HYDROMORPHONE HCL 1 MG/ML IJ SOLN
0.5000 mg | Freq: Once | INTRAMUSCULAR | Status: AC
  Administered 2022-11-13: 0.5 mg via INTRAVENOUS
  Filled 2022-11-13: qty 1

## 2022-11-13 MED ORDER — KETOROLAC TROMETHAMINE 30 MG/ML IJ SOLN
15.0000 mg | Freq: Once | INTRAMUSCULAR | Status: AC
  Administered 2022-11-13: 15 mg via INTRAVENOUS
  Filled 2022-11-13: qty 1

## 2022-11-13 MED ORDER — OXYCODONE-ACETAMINOPHEN 5-325 MG PO TABS: 1.0000 | ORAL_TABLET | Freq: Four times a day (QID) | ORAL | 0 refills | Status: DC | PRN

## 2022-11-13 MED ORDER — LACTATED RINGERS IV BOLUS
1000.0000 mL | Freq: Once | INTRAVENOUS | Status: AC
  Administered 2022-11-13: 1000 mL via INTRAVENOUS

## 2022-11-13 NOTE — ED Provider Notes (Signed)
EMERGENCY DEPARTMENT AT [facility] Provider Note   CSN: [id number] Arrival date & time: 11/12/22  2330     History {Add pertinent medical, surgical, social history, OB history to HPI:1} Chief Complaint  Patient presents with   Back Pain    Beverly Johnson is a 47 y.o. female.  Patient presents to the emergency department for persistent back pain.  Patient reports that the pain began 4 days ago.  She was seen at urgent care and given muscle relaxer.  She was seen at [facility] yesterday, given Cipro for UTI and prescribed lidocaine patches which she was unable to get at her pharmacy.  Patient reports that most of her back hurts currently.  The pain radiates around to the abdomen bilaterally.  Patient reports pain with movement.  Pain does not radiate to the legs.  No problems urinating.       Home Medications Prior to Admission medications   Medication Sig Start Date End Date Taking? Authorizing Provider  ACCU-CHEK GUIDE test strip USE AS INSTRUCTED 04/02/22   Fenton Foy, NP  Accu-Chek Softclix Lancets lancets 1 each 3 (three) times daily. 02/06/21   [provider]  ciprofloxacin (CIPRO) 500 MG tablet Take 1 tablet (500 mg total) by mouth every 12 (twelve) hours for 13 doses. 11/12/22 11/19/22  Audley Hose, MD  cyclobenzaprine (FLEXERIL) 10 MG tablet Take 1 tablet by mouth 3 times daily as needed for muscle spasm. Warning: May cause drowsiness. 11/09/22   Vanessa Kick, MD  diazepam (VALIUM) 2 MG tablet Take 1 tablet (2 mg total) by mouth every 8 (eight) hours as needed for up to 5 days for muscle spasms. 11/11/22 11/16/22  Audley Hose, MD  GENVOYA 150-150-200-10 MG TABS tablet TAKE ONE TABLET BY MOUTH DAILY WITH BREAKFAST 06/02/22   Comer, Okey Regal, MD  GLOBAL EASE INJECT PEN NEEDLES 32G X 4 MM MISC  11/15/21   [provider]  glucose blood test strip Check your sugar in the morning before you eat breakfast, and one hour after a meal.  05/26/19   Melynda Ripple, MD  glucose monitoring kit (FREESTYLE) monitoring kit 1 each by Does not apply route daily. Check glucose once in the morning before breakfast and 1 hour after a meal 02/05/21   Vevelyn Francois, NP  Insulin Glargine Solostar (LANTUS) 100 UNIT/ML Solostar Pen INJECT 46 UNITS INTO THE SKIN DAILY 04/02/22   Fenton Foy, NP  lidocaine (LIDODERM) 5 % Place 1 patch onto the skin daily. Remove & Discard patch within 12 hours or as directed by MD 11/11/22   Audley Hose, MD  tranexamic acid (LYSTEDA) 650 MG TABS tablet Take 2 tablets (1,300 mg total) by mouth 3 (three) times daily. Take during menses for a maximum of five days 11/05/22   Griffin Basil, MD  cetirizine (ZYRTEC ALLERGY) 10 MG tablet Take 1 tablet (10 mg total) by mouth daily. Patient not taking: Reported on 05/27/2020 11/26/19 11/08/20  Henderly, Britni A, PA-C      Allergies    Patient has no known allergies.    Review of Systems   Review of Systems  Physical Exam Updated Vital Signs BP (!) 153/92 (BP Location: Right Arm)   Pulse 91   Temp 98.8 F (37.1 C) (Oral)   Resp 16   LMP 10/27/2022 (Approximate)   SpO2 98%  Physical Exam Vitals and nursing note reviewed.  Constitutional:      General: She  is not in acute distress.    Appearance: She is well-developed. She is obese.  HENT:     Head: Normocephalic and atraumatic.     Mouth/Throat:     Mouth: Mucous membranes are moist.  Eyes:     General: Vision grossly intact. Gaze aligned appropriately.     Extraocular Movements: Extraocular movements intact.     Conjunctiva/sclera: Conjunctivae normal.  Cardiovascular:     Rate and Rhythm: Normal rate and regular rhythm.     Pulses: Normal pulses.     Heart sounds: Normal heart sounds, S1 normal and S2 normal. No murmur heard.    No friction rub. No gallop.  Pulmonary:     Effort: Pulmonary effort is normal. No respiratory distress.     Breath sounds: Normal breath sounds.  Abdominal:      General: Bowel sounds are normal.     Palpations: Abdomen is soft.     Tenderness: There is no abdominal tenderness. There is no guarding or rebound.     Hernia: No hernia is present.  Musculoskeletal:        General: No swelling.     Cervical back: Full passive range of motion without pain, normal range of motion and neck supple. No spinous process tenderness or muscular tenderness. Normal range of motion.     Lumbar back: Spasms and tenderness present. Decreased range of motion. Negative right straight leg raise test and negative left straight leg raise test.     Right lower leg: No edema.     Left lower leg: No edema.  Skin:    General: Skin is warm and dry.     Capillary Refill: Capillary refill takes less than 2 seconds.     Findings: No ecchymosis, erythema, rash or wound.  Neurological:     General: No focal deficit present.     Mental Status: She is alert and oriented to person, place, and time.     GCS: GCS eye subscore is 4. GCS verbal subscore is 5. GCS motor subscore is 6.     Cranial Nerves: Cranial nerves 2-12 are intact.     Sensory: Sensation is intact.     Motor: Motor function is intact.     Coordination: Coordination is intact.  Psychiatric:        Attention and Perception: Attention normal.        Mood and Affect: Mood normal.        Speech: Speech normal.        Behavior: Behavior normal.     ED Results / Procedures / Treatments   Labs (all labs ordered are listed, but only abnormal results are displayed) Labs Reviewed - No data to display  EKG None  Radiology CT Renal Stone Study  Result Date: 11/11/2022 CLINICAL DATA:  Abdominal and flank pain with stone suspected. Low back pain for a few days. EXAM: CT ABDOMEN AND PELVIS WITHOUT CONTRAST TECHNIQUE: Multidetector CT imaging of the abdomen and pelvis was performed following the standard protocol without IV contrast. RADIATION DOSE REDUCTION: This exam was performed according to the departmental  dose-optimization program which includes automated exposure control, adjustment of the mA and/or kV according to patient size and/or use of iterative reconstruction technique. COMPARISON:  10/21/2021 FINDINGS: Lower chest: Patchy atelectasis in the lung bases. Hepatobiliary: No focal liver abnormality is seen. Status post cholecystectomy. No biliary dilatation. Pancreas: Unremarkable. No pancreatic ductal dilatation or surrounding inflammatory changes. Spleen: Normal in size without focal abnormality. Adrenals/Urinary Tract: No  adrenal gland nodules. Kidneys are symmetrical. No hydronephrosis or hydroureter. No renal, ureteral, or bladder stones. Bladder is normal. Stomach/Bowel: Stomach, small bowel, and colon are not abnormally distended. No wall thickening or inflammatory changes. Appendix is normal. Vascular/Lymphatic: No significant vascular findings are present. No enlarged abdominal or pelvic lymph nodes. Reproductive: Nodular enlargement of the uterus with calcification consistent with uterine fibroids. No abnormal adnexal masses. Other: No free air or free fluid in the abdomen. Abdominal wall musculature appears intact. Musculoskeletal: No acute or significant osseous findings. IMPRESSION: 1. No renal or ureteral stone or obstruction. 2. Atelectasis in the lung bases. 3. Calcified uterine fibroid. 4. No evidence of bowel obstruction or inflammation. Electronically Signed   By: Lucienne Capers M.D.   On: 11/11/2022 20:55   DG Lumbar Spine 2-3 Views  Result Date: 11/11/2022 CLINICAL DATA:  Back pain EXAM: LUMBAR SPINE - 2-3 VIEW COMPARISON:  Lumbar spine x-ray 07/18/2017 FINDINGS: There is no evidence of lumbar spine fracture. Alignment is normal. Intervertebral disc spaces are maintained. IMPRESSION: Negative. Electronically Signed   By: Ronney Asters M.D.   On: 11/11/2022 19:14    Procedures Procedures  {Document cardiac monitor, telemetry assessment procedure when appropriate:1}  Medications  Ordered in ED Medications - No data to display  ED Course/ Medical Decision Making/ A&P   {   Click here for ABCD2, HEART and other calculatorsREFRESH Note before signing :1}                          Medical Decision Making  ***  {Document critical care time when appropriate:1} {Document review of labs and clinical decision tools ie heart score, Chads2Vasc2 etc:1}  {Document your independent review of radiology images, and any outside records:1} {Document your discussion with family members, caretakers, and with consultants:1} {Document social determinants of health affecting pt's care:1} {Document your decision making why or why not admission, treatments were needed:1} Final Clinical Impression(s) / ED Diagnoses Final diagnoses:  None    Rx / DC Orders ED Discharge Orders     None

## 2022-11-17 ENCOUNTER — Other Ambulatory Visit: Payer: Self-pay | Admitting: Internal Medicine

## 2022-11-17 DIAGNOSIS — B20 Human immunodeficiency virus [HIV] disease: Secondary | ICD-10-CM

## 2022-11-18 NOTE — Telephone Encounter (Signed)
Called patient to discuss DDI with Oxycodone and Genvoya, no answer. Left HIPAA compliant voicemail stating MyChart message would be sent and to please call with questions.   Beryle Flock, RN

## 2022-11-19 ENCOUNTER — Ambulatory Visit (HOSPITAL_COMMUNITY): Payer: Medicaid Other

## 2022-11-20 ENCOUNTER — Telehealth: Payer: Self-pay | Admitting: Nurse Practitioner

## 2022-11-20 NOTE — Telephone Encounter (Signed)
Called Pt and inform message. Gh 

## 2022-11-20 NOTE — Telephone Encounter (Signed)
Pt is schedule for a f/u visit on 11/24/22. Gh

## 2022-11-20 NOTE — Telephone Encounter (Signed)
Caller & Relationship to patient:  MRN #  [id number]   Call Back Number:   Date of Last Office Visit: 06/30/2022     Date of Next Office Visit: 11/24/2022    Medication(s) to be Refilled: oxycodone   Preferred Pharmacy: [facility]   ** Please notify patient to allow 48-72 hours to process** **Let patient know to contact pharmacy at the end of the day to make sure medication is ready. ** **If patient has not been seen in a year or longer, book an appointment **Advise to use MyChart for refill requests OR to contact their pharmacy

## 2022-11-24 ENCOUNTER — Ambulatory Visit (INDEPENDENT_AMBULATORY_CARE_PROVIDER_SITE_OTHER): Payer: Medicaid Other | Admitting: Nurse Practitioner

## 2022-11-24 ENCOUNTER — Encounter: Payer: Self-pay | Admitting: Nurse Practitioner

## 2022-11-24 VITALS — BP 111/76 | HR 92 | Temp 97.4°F | Ht 67.0 in | Wt 121.6 lb

## 2022-11-24 DIAGNOSIS — M545 Low back pain, unspecified: Secondary | ICD-10-CM | POA: Diagnosis not present

## 2022-11-24 DIAGNOSIS — R809 Proteinuria, unspecified: Secondary | ICD-10-CM | POA: Diagnosis not present

## 2022-11-24 DIAGNOSIS — R600 Localized edema: Secondary | ICD-10-CM | POA: Diagnosis not present

## 2022-11-24 DIAGNOSIS — R590 Localized enlarged lymph nodes: Secondary | ICD-10-CM | POA: Insufficient documentation

## 2022-11-24 DIAGNOSIS — E119 Type 2 diabetes mellitus without complications: Secondary | ICD-10-CM

## 2022-11-24 DIAGNOSIS — Z1211 Encounter for screening for malignant neoplasm of colon: Secondary | ICD-10-CM | POA: Diagnosis not present

## 2022-11-24 DIAGNOSIS — Z794 Long term (current) use of insulin: Secondary | ICD-10-CM

## 2022-11-24 DIAGNOSIS — E1129 Type 2 diabetes mellitus with other diabetic kidney complication: Secondary | ICD-10-CM

## 2022-11-24 LAB — POCT GLYCOSYLATED HEMOGLOBIN (HGB A1C): Hemoglobin A1C: 11.9 % — AB (ref 4.0–5.6)

## 2022-11-24 MED ORDER — CYCLOBENZAPRINE HCL 10 MG PO TABS: ORAL_TABLET | ORAL | 0 refills | Status: DC

## 2022-11-24 MED ORDER — LIDOCAINE 5 % EX PTCH: 1.0000 | MEDICATED_PATCH | CUTANEOUS | 0 refills | Status: DC

## 2022-11-24 MED ORDER — IBUPROFEN 800 MG PO TABS: 800.0000 mg | ORAL_TABLET | Freq: Three times a day (TID) | ORAL | 0 refills | Status: DC | PRN

## 2022-11-24 NOTE — Assessment & Plan Note (Addendum)
Acute low back pain - ibuprofen (ADVIL) 800 MG tablet; Take 1 tablet (800 mg total) by mouth every 8 (eight) hours as needed.  She was encouraged not to take ibuprofen at the same time with Genvoya to minimize risk of interactions.   - cyclobenzaprine (FLEXERIL) 10 MG tablet; Take 1 tablet by mouth 3 times daily as needed for muscle spasm. Warning: May cause drowsiness.  Dispense: 21 tablet; Refill: 0 - lidocaine (LIDODERM) 5 %; Place 1 patch onto the skin daily. Remove & Discard patch within 12 hours or as directed by MD  Dispense: 30 patch; Refill: 0 - Ambulatory referral to Orthopedic Surgery Stretching exercises encouraged. Patient declined referral to physical therapy, declined Toradol injection

## 2022-11-24 NOTE — Assessment & Plan Note (Signed)
Checking urine microalbumin labs Need to get diabetes under control discussed with the patient

## 2022-11-24 NOTE — Progress Notes (Signed)
New Patient Office Visit  Subjective:  Patient ID: Beverly Johnson, female    DOB: 1975/10/08  Age: 47 y.o. MRN: [id number]  CC:  Chief Complaint  Patient presents with   Follow-up    HPI Beverly Johnson is a 47 y.o. female  has a past medical history of Back pain, DM2 (diabetes mellitus, type 2), HIV infection, and Microalbuminuria due to type 2 diabetes mellitus.  Patient presents for follow-up for her acute bilateral low back pain .  Previous patient of King NP.  Patient was last seen in this office about a year ago.  Acute bilateral low back pain.  Patient had presented to the emergency department on, 11/09/2022, 11/11/2022 and 11/12/2022 for complaints of back pain.  She was treated for UTI with Cipro, she was also prescribed lidocaine patches, Percocet.  Patient stated that her pain has not improved, she has run out of Percocet, Tylenol and ibuprofen does not help. currently has sharp pain in her lower back.  Pain does not radiate to her legs.  NO complaints of fever, chills, chest pain, shortness of breath. urinary incontinence or bowel incontinence, urinary frequency, dysuria.     Type 2 diabetes.  Patient reports that she does not check her blood sugar, currently has Lantus 46 units daily ordered but she reports that she has been taking between 16 to 18 units daily.  She denies polyuria, polyphagia, polydipsia  Bilateral lower extremity edema patient complains of bilateral lower extremity edema, not sure when the swelling started, she tries to keep her legs elevated when sitting, elevating her legs does not help.  She denies leg pain, numbness, tingling     Past Medical History:  Diagnosis Date   Back pain    DM2 (diabetes mellitus, type 2)    HIV infection    Microalbuminuria due to type 2 diabetes mellitus     Past Surgical History:  Procedure Laterality Date   CESAREAN SECTION     x3   CHOLECYSTECTOMY  06/29/2011   Procedure: LAPAROSCOPIC CHOLECYSTECTOMY WITH  INTRAOPERATIVE CHOLANGIOGRAM;  Surgeon: Robyne AskewPaul S Toth III, MD;  Location: [facility] OR;  Service: General;  Laterality: N/A;   TUBAL LIGATION      Family History  Problem Relation Age of Onset   Healthy Mother    Healthy Father    Breast cancer Neg Hx     Social History   Socioeconomic History   Marital status: Married    Spouse name: Not on file   Number of children: Not on file   Years of education: 10   Highest education level: Not on file  Occupational History    Employer: UNEMPLOYED  Tobacco Use   Smoking status: Never   Smokeless tobacco: Never  Vaping Use   Vaping Use: Never used  Substance and Sexual Activity   Alcohol use: No   Drug use: Not Currently    Types: Marijuana    Comment: CBD   Sexual activity: Yes    Partners: Male  Other Topics Concern   Not on file  Social History Narrative   Not on file   Social Determinants of Health   Financial Resource Strain: Not on file  Food Insecurity: No Food Insecurity (02/24/2021)   Hunger Vital Sign    Worried About Running Out of Food in the Last Year: Never true    Ran Out of Food in the Last Year: Never true  Transportation Needs: No Transportation Needs (02/24/2021)   PRAPARE - Transportation  Lack of Transportation (Medical): No    Lack of Transportation (Non-Medical): No  Physical Activity: Not on file  Stress: Not on file  Social Connections: Not on file  Intimate Partner Violence: Not on file    ROS Review of Systems  Constitutional: Negative.  Negative for diaphoresis, fatigue, fever and unexpected weight change.  Respiratory:  Negative for apnea, cough, choking, chest tightness and shortness of breath.   Cardiovascular:  Positive for leg swelling. Negative for chest pain and palpitations.  Gastrointestinal: Negative.   Genitourinary: Negative.   Musculoskeletal:  Positive for arthralgias and back pain.  Skin: Negative.   Neurological: Negative.  Negative for dizziness, seizures, light-headedness and  numbness.  Psychiatric/Behavioral: Negative.      Objective:   Today's Vitals: BP 111/76   Pulse 92   Temp (!) 97.4 F (36.3 C)   Ht 5\' 7"  (1.702 m)   Wt 121 lb 9.6 oz (55.2 kg)   LMP 10/27/2022 (Approximate) Comment: negative urine pregnancy test 11/07/22  SpO2 99%   BMI 19.05 kg/m   Physical Exam Constitutional:      General: She is not in acute distress.    Appearance: She is not ill-appearing, toxic-appearing or diaphoretic.  Eyes:     General: No scleral icterus.       Right eye: No discharge.        Left eye: No discharge.     Extraocular Movements: Extraocular movements intact.  Cardiovascular:     Rate and Rhythm: Normal rate and regular rhythm.     Pulses: Normal pulses.     Heart sounds: Normal heart sounds. No murmur heard.    No friction rub. No gallop.  Pulmonary:     Effort: Pulmonary effort is normal. No respiratory distress.     Breath sounds: No stridor. No wheezing, rhonchi or rales.  Chest:     Chest wall: No tenderness.  Abdominal:     General: There is no distension.     Palpations: Abdomen is soft.     Tenderness: There is no abdominal tenderness.  Musculoskeletal:        General: Tenderness present.     Right lower leg: No edema.     Left lower leg: No edema.     Comments: Tenderness on palpation of low back area, skin warm and dry ,no swelling or redness noted, has palpable pedal pulses.  Skin:    General: Skin is warm and dry.     Capillary Refill: Capillary refill takes less than 2 seconds.     Coloration: Skin is not jaundiced.     Findings: No bruising.  Neurological:     Mental Status: She is alert and oriented to person, place, and time.     Sensory: No sensory deficit.     Motor: No weakness.     Coordination: Coordination normal.  Psychiatric:        Mood and Affect: Mood normal.        Behavior: Behavior normal.        Thought Content: Thought content normal.        Judgment: Judgment normal.     Assessment & Plan:    Problem List Items Addressed This Visit       Endocrine   Type 2 diabetes mellitus without complications - Primary    Lab Results  Component Value Date   HGBA1C 8.7 (H) 05/13/2022  Current A1c 11.9 I doubt compliance with her current medication Patient encouraged to take Lantus  48 units daily as ordered CBG goals discussed Patient counseled on low-carb modified diet Checking urine microalbumin labs Need to get diabetes under control was discussed      Relevant Orders   Microalbumin / creatinine urine ratio   POCT glycosylated hemoglobin (Hb A1C) (Completed)   Microalbuminuria due to type 2 diabetes mellitus    Checking urine microalbumin labs Need to get diabetes under control discussed with the patient        Immune and Lymphatic   Lymphadenopathy, abdominal    Noted on recent CT scan.  Prominent size of pelvic and lower abdominal retroperitoneal lymph nodes with mild increase from CT 1 year prior. No underlying cause is seen, follow-up could be considered for stability or resolution.  MRI of the pelvis ordered by OB/GYN is pending.  Will redo an abdominal CT scan in the future as recommended if needed after MRI of the pelvis has been obtained.         Other   Acute low back pain without sciatica    Acute low back pain - ibuprofen (ADVIL) 800 MG tablet; Take 1 tablet (800 mg total) by mouth every 8 (eight) hours as needed.  She was encouraged not to take ibuprofen at the same time with Genvoya to minimize risk of interactions.   - cyclobenzaprine (FLEXERIL) 10 MG tablet; Take 1 tablet by mouth 3 times daily as needed for muscle spasm. Warning: May cause drowsiness.  Dispense: 21 tablet; Refill: 0 - lidocaine (LIDODERM) 5 %; Place 1 patch onto the skin daily. Remove & Discard patch within 12 hours or as directed by MD  Dispense: 30 patch; Refill: 0 - Ambulatory referral to Orthopedic Surgery Stretching exercises encouraged. Patient declined referral to physical  therapy, declined Toradol injection      Relevant Medications   ibuprofen (ADVIL) 800 MG tablet   cyclobenzaprine (FLEXERIL) 10 MG tablet   lidocaine (LIDODERM) 5 %   Other Relevant Orders   Ambulatory referral to Orthopedic Surgery   Bilateral leg edema    No edema noted on examination today DASH diet advised Keep legs elevated when sitting with compression socks as needed      Other Visit Diagnoses     Screening for colon cancer       Relevant Orders   Cologuard       Outpatient Encounter Medications as of 11/24/2022  Medication Sig   GENVOYA 150-150-200-10 MG TABS tablet TAKE ONE TABLET BY MOUTH DAILY WITH BREAKFAST   ibuprofen (ADVIL) 800 MG tablet Take 1 tablet (800 mg total) by mouth every 8 (eight) hours as needed.   Insulin Glargine Solostar (LANTUS) 100 UNIT/ML Solostar Pen INJECT 46 UNITS INTO THE SKIN DAILY   oxyCODONE-acetaminophen (PERCOCET) 5-325 MG tablet Take 1 tablet by mouth every 6 (six) hours as needed for severe pain.   RESTASIS 0.05 % ophthalmic emulsion    tranexamic acid (LYSTEDA) 650 MG TABS tablet Take 2 tablets (1,300 mg total) by mouth 3 (three) times daily. Take during menses for a maximum of five days   ACCU-CHEK GUIDE test strip USE AS INSTRUCTED (Patient not taking: Reported on 11/24/2022)   Accu-Chek Softclix Lancets lancets 1 each 3 (three) times daily. (Patient not taking: Reported on 11/24/2022)   cyclobenzaprine (FLEXERIL) 10 MG tablet Take 1 tablet by mouth 3 times daily as needed for muscle spasm. Warning: May cause drowsiness.   GLOBAL EASE INJECT PEN NEEDLES 32G X 4 MM MISC  (Patient not taking: Reported on 11/24/2022)  glucose blood test strip Check your sugar in the morning before you eat breakfast, and one hour after a meal. (Patient not taking: Reported on 11/24/2022)   glucose monitoring kit (FREESTYLE) monitoring kit 1 each by Does not apply route daily. Check glucose once in the morning before breakfast and 1 hour after a meal (Patient not  taking: Reported on 11/24/2022)   lidocaine (LIDODERM) 5 % Place 1 patch onto the skin daily. Remove & Discard patch within 12 hours or as directed by MD   [DISCONTINUED] cetirizine (ZYRTEC ALLERGY) 10 MG tablet Take 1 tablet (10 mg total) by mouth daily. (Patient not taking: Reported on 05/27/2020)   [DISCONTINUED] cyclobenzaprine (FLEXERIL) 10 MG tablet Take 1 tablet by mouth 3 times daily as needed for muscle spasm. Warning: May cause drowsiness. (Patient not taking: Reported on 11/24/2022)   [DISCONTINUED] lidocaine (LIDODERM) 5 % Place 1 patch onto the skin daily. Remove & Discard patch within 12 hours or as directed by MD (Patient not taking: Reported on 11/24/2022)   No facility-administered encounter medications on file as of 11/24/2022.    Follow-up: Return in about 3 months (around 02/23/2023) for T2DM /CPE.   Donell Beers, FNP

## 2022-11-24 NOTE — Patient Instructions (Signed)
1. Type 2 diabetes mellitus without complication, with long-term current use of insulin  - Microalbumin / creatinine urine ratio - POCT glycosylated hemoglobin (Hb A1C)   Goal for fasting blood sugar ranges from 80 to 120 and 2 hours after any meal or at bedtime should be between 130 to 170.  2. Acute bilateral low back pain without sciatica  - ibuprofen (ADVIL) 800 MG tablet; Take 1 tablet (800 mg total) by mouth every 8 (eight) hours as needed.  Dispense: 20 tablet; Refill: 0 - cyclobenzaprine (FLEXERIL) 10 MG tablet; Take 1 tablet by mouth 3 times daily as needed for muscle spasm. Warning: May cause drowsiness.  Dispense: 21 tablet; Refill: 0 - lidocaine (LIDODERM) 5 %; Place 1 patch onto the skin daily. Remove & Discard patch within 12 hours or as directed by MD  Dispense: 30 patch; Refill: 0 - Ambulatory referral to Orthopedic Surgery  3. Bilateral leg edema   4. Screening for colon cancer  - Cologuard    It is important that you exercise regularly at least 30 minutes 5 times a week as tolerated  Think about what you will eat, plan ahead. Choose " clean, green, fresh or frozen" over canned, processed or packaged foods which are more sugary, salty and fatty. 70 to 75% of food eaten should be vegetables and fruit. Three meals at set times with snacks allowed between meals, but they must be fruit or vegetables. Aim to eat over a 12 hour period , example 7 am to 7 pm, and STOP after  your last meal of the day. Drink water,generally about 64 ounces per day, no other drink is as healthy. Fruit juice is best enjoyed in a healthy way, by EATING the fruit.  Thanks for choosing [facility] we consider it a privelige to serve you.

## 2022-11-24 NOTE — Assessment & Plan Note (Signed)
Noted on recent CT scan.  Prominent size of pelvic and lower abdominal retroperitoneal lymph nodes with mild increase from CT 1 year prior. No underlying cause is seen, follow-up could be considered for stability or resolution.  MRI of the pelvis ordered by OB/GYN is pending.  Will redo an abdominal CT scan in the future as recommended if needed after MRI of the pelvis has been obtained.

## 2022-11-24 NOTE — Assessment & Plan Note (Signed)
No edema noted on examination today DASH diet advised Keep legs elevated when sitting with compression socks as needed

## 2022-11-24 NOTE — Assessment & Plan Note (Signed)
Lab Results  Component Value Date   HGBA1C 8.7 (H) 05/13/2022  Current A1c 11.9 I doubt compliance with her current medication Patient encouraged to take Lantus 48 units daily as ordered CBG goals discussed Patient counseled on low-carb modified diet Checking urine microalbumin labs Need to get diabetes under control was discussed

## 2022-11-25 LAB — MICROALBUMIN / CREATININE URINE RATIO
Creatinine, Urine: 148 mg/dL
Microalb/Creat Ratio: 4 mg/g creat (ref 0–29)
Microalbumin, Urine: 5.7 ug/mL

## 2022-11-30 ENCOUNTER — Ambulatory Visit: Payer: Medicaid Other | Admitting: Physical Medicine and Rehabilitation

## 2022-12-02 ENCOUNTER — Ambulatory Visit (INDEPENDENT_AMBULATORY_CARE_PROVIDER_SITE_OTHER): Payer: Medicaid Other | Admitting: Physical Medicine and Rehabilitation

## 2022-12-02 ENCOUNTER — Encounter: Payer: Self-pay | Admitting: Physical Medicine and Rehabilitation

## 2022-12-02 DIAGNOSIS — G8929 Other chronic pain: Secondary | ICD-10-CM | POA: Diagnosis not present

## 2022-12-02 DIAGNOSIS — M542 Cervicalgia: Secondary | ICD-10-CM | POA: Diagnosis not present

## 2022-12-02 DIAGNOSIS — M7918 Myalgia, other site: Secondary | ICD-10-CM | POA: Diagnosis not present

## 2022-12-02 DIAGNOSIS — M546 Pain in thoracic spine: Secondary | ICD-10-CM | POA: Diagnosis not present

## 2022-12-02 DIAGNOSIS — M545 Low back pain, unspecified: Secondary | ICD-10-CM | POA: Diagnosis not present

## 2022-12-02 MED ORDER — MELOXICAM 15 MG PO TABS: 15.0000 mg | ORAL_TABLET | Freq: Every day | ORAL | 0 refills | Status: DC

## 2022-12-02 NOTE — Progress Notes (Signed)
Beverly Johnson - 47 y.o. female MRN [id number]  Date of birth: October 08, 1975  Office Visit Note: Visit Date: 12/02/2022 PCP: Donell Beers, FNP Referred by: Donell Beers, FNP  Subjective: Chief Complaint  Patient presents with   Lower Back - Pain   HPI: Beverly Johnson is a 47 y.o. female who comes in today for evaluation of chronic, worsening and severe bilateral lower back pain, intermittent radiation of pain up entire back to neck. Pain ongoing for several months. Pain is unable to voice any aggravating factors. She describes pain as sore, aching and dull sensation, currently rates as 10 out of 10. Patient recently evaluated at Urgent Care on 11/09/2022, [facility] emergency department on 11/11/2022 and [facility] emergency department on 11/12/2022. Recent work up revealed marginally positive UA, patient treated for possible pyelonephritis with Ciprofloxacin. Recent CT of lumbar spine exhibits no acute focal finding to explain symptoms. No relief of pain with intramuscular Toradol injection, Flexeril, Robaxin and Meloxicam. She reports some relief of pain with Percocet, however she is out of this medication. Patient reports she is constantly in pain, feels her entire back hurts everyday. She currently works for [employer]. Patient denies focal weakness, numbness and tingling. No recent trauma or falls.    Review of Systems  Musculoskeletal:  Positive for back pain, myalgias and neck pain.  Neurological:  Negative for tingling, sensory change, focal weakness and weakness.  All other systems reviewed and are negative.  Otherwise per HPI.  Assessment & Plan: Visit Diagnoses:    ICD-10-CM   1. Chronic bilateral low back pain without sciatica  M54.50 Ambulatory referral to Physical Therapy   G89.29     2. Chronic bilateral thoracic back pain  M54.6 Ambulatory referral to Physical Therapy   G89.29     3. Chronic neck pain  M54.2 Ambulatory referral to Physical Therapy   G89.29     4.  Myofascial pain syndrome  M79.18 Ambulatory referral to Physical Therapy    5. Morbid (severe) obesity due to excess calories  E66.01 Ambulatory referral to Physical Therapy       Plan: Findings:  Chronic, worsening and severe bilateral lower back pain, intermittent radiation of pain up entire back to neck.  Patient continues to have severe pain despite good conservative therapy such as home exercise regimen, rest and use of medications.  Patient's clinical presentation and exam are consistent with myofascial pain syndrome.  Recent CT scan of lumbar spine is reassuring, no acute or focal finding to explain patient's symptoms.  Her exam today is nonfocal, no myelopathic symptoms noted, good strength noted to bilateral lower extremities. She is ambulatory without difficulty. We do not feel her symptoms are directly related to her spine. There are multiple palpable trigger points noted to bilateral quadratus lumborum muscles.  She is diffusely tender upon palpation of entire back and neck upon exam today. Next step is to place order for formal physical therapy, I do feel she would benefit from PT, especially from pain standpoint. Would recommend manual treatments and possible dry needling. I also discussed medication management with patent today, prescribed Meloxicam for her to take daily for several weeks. Patient can follow up with [place] as needed, however I am not sure we would have much to offer as her symptoms do not seem to be directed related to her spine proper. No red flag symptoms noted upon exam today.     Meds & Orders:  Meds ordered this encounter  Medications  meloxicam (MOBIC) 15 MG tablet    Sig: Take 1 tablet (15 mg total) by mouth daily.    Dispense:  30 tablet    Refill:  0    Orders Placed This Encounter  Procedures   Ambulatory referral to Physical Therapy    Follow-up: Return if symptoms worsen or fail to improve.   Procedures: No procedures performed      Clinical  History: CLINICAL DATA:  Back pain for several days   EXAM: CT Lumbar Spine with contrast   TECHNIQUE: Technique: Multiplanar CT images of the lumbar spine were reconstructed from contemporary CT of the Abdomen and Pelvis.   RADIATION DOSE REDUCTION: This exam was performed according to the departmental dose-optimization program which includes automated exposure control, adjustment of the mA and/or kV according to patient size and/or use of iterative reconstruction technique.   CONTRAST:  None additional   COMPARISON:  None Available.   FINDINGS: Segmentation: 5 lumbar type vertebrae.   Alignment: Normal.   Vertebrae: No acute fracture or focal pathologic process.   Paraspinal and other soft tissues: Negative.   Disc levels: No notable degenerative change or bony impingement.   IMPRESSION: No acute or focal finding to explain symptoms.     Electronically Signed   By: Tiburcio Pea M.D.   On: 11/13/2022 05:13   She reports that she has never smoked. She has never used smokeless tobacco.  Recent Labs    03/14/22 0000 05/13/22 1124 11/24/22 1039  HGBA1C 10.5 8.7* 11.9*    Objective:  VS:  HT:    WT:   BMI:     BP:   HR: bpm  TEMP: ( )  RESP:  Physical Exam Vitals and nursing note reviewed.  HENT:     Head: Normocephalic and atraumatic.     Right Ear: External ear normal.     Left Ear: External ear normal.     Nose: Nose normal.     Mouth/Throat:     Mouth: Mucous membranes are moist.  Eyes:     Extraocular Movements: Extraocular movements intact.  Cardiovascular:     Rate and Rhythm: Normal rate.     Pulses: Normal pulses.  Pulmonary:     Effort: Pulmonary effort is normal.  Abdominal:     General: Abdomen is flat. There is no distension.  Musculoskeletal:        General: Tenderness present.     Cervical back: Normal range of motion. Tenderness present.     Comments: Patient rises from seated position to standing without difficulty. Good  lumbar range of motion. No pain noted with facet loading. 5/5 strength noted with bilateral hip flexion, knee flexion/extension, ankle dorsiflexion/plantarflexion and EHL. No clonus noted bilaterally. No pain upon palpation of greater trochanters. No pain with internal/external rotation of bilateral hips. Sensation intact bilaterally. Multiple palpable trigger points noted to bilateral quadratus lumborum muscles.  She is diffusely tender upon palpation of entire back and neck today.  Negative slump test bilaterally. Ambulates without aid, gait steady.     Skin:    General: Skin is warm and dry.     Capillary Refill: Capillary refill takes less than 2 seconds.  Neurological:     General: No focal deficit present.     Mental Status: She is alert and oriented to person, place, and time.  Psychiatric:        Mood and Affect: Mood normal.        Behavior: Behavior normal.  Ortho Exam  Imaging: No results found.  Past Medical/Family/Surgical/Social History: Medications & Allergies reviewed per EMR, new medications updated. Patient Active Problem List   Diagnosis Date Noted   Bilateral leg edema 11/24/2022   Lymphadenopathy, abdominal 11/24/2022   Acute low back pain without sciatica 11/11/2022   Menorrhagia 11/05/2022   Anemia 03/09/2022   Leukopenia 11/25/2021   Need for prophylactic vaccination and inoculation against influenza 05/28/2021   Adenomyosis 02/24/2021   Vulvovaginal candidiasis 01/16/2021   Dysmenorrhea 01/16/2021   Callus of foot 11/26/2020   Heavy menses 11/26/2020   History of 2019 novel coronavirus disease (COVID-19) 06/16/2019   Abnormal ECG 06/16/2019   Medication monitoring encounter 11/12/2017   Breast nodule 09/02/2017   Screening examination for venereal disease 03/30/2017   Encounter for long-term (current) use of high-risk medication 03/30/2017   Microalbuminuria due to type 2 diabetes mellitus 03/30/2017   Knee pain, chronic 03/30/2017   Vitamin D  deficiency 03/21/2017   Healthcare maintenance 03/02/2014   Class 2 obesity due to excess calories with body mass index (BMI) of 38.0 to 38.9 in adult 04/27/2012   Type 2 diabetes mellitus without complications 12/08/2010   Human immunodeficiency virus (HIV) disease 09/29/2006   Past Medical History:  Diagnosis Date   Back pain    DM2 (diabetes mellitus, type 2)    HIV infection    Microalbuminuria due to type 2 diabetes mellitus    Family History  Problem Relation Age of Onset   Healthy Mother    Healthy Father    Breast cancer Neg Hx    Past Surgical History:  Procedure Laterality Date   CESAREAN SECTION     x3   CHOLECYSTECTOMY  06/29/2011   Procedure: LAPAROSCOPIC CHOLECYSTECTOMY WITH INTRAOPERATIVE CHOLANGIOGRAM;  Surgeon: Robyne Askew, MD;  Location: [facility] OR;  Service: General;  Laterality: N/A;   TUBAL LIGATION     Social History   Occupational History    Employer: UNEMPLOYED  Tobacco Use   Smoking status: Never   Smokeless tobacco: Never  Vaping Use   Vaping Use: Never used  Substance and Sexual Activity   Alcohol use: No   Drug use: Not Currently    Types: Marijuana    Comment: CBD   Sexual activity: Yes    Partners: Male

## 2022-12-02 NOTE — Progress Notes (Signed)
Functional Pain Scale - descriptive words and definitions  Distressing (6)    Pain is present/unable to complete most ADLs limited by pain/sleep is difficult and active distraction is only marginal. Moderate range order  Average Pain 9  Lower back pain on both sides that radiates to the abdomen. Ibuprofen helps at times

## 2022-12-10 ENCOUNTER — Ambulatory Visit (INDEPENDENT_AMBULATORY_CARE_PROVIDER_SITE_OTHER): Payer: Medicaid Other | Admitting: Obstetrics and Gynecology

## 2022-12-10 ENCOUNTER — Other Ambulatory Visit: Payer: Self-pay

## 2022-12-10 ENCOUNTER — Encounter: Payer: Self-pay | Admitting: Obstetrics and Gynecology

## 2022-12-10 VITALS — BP 138/95 | HR 89 | Ht 67.0 in | Wt 274.6 lb

## 2022-12-10 DIAGNOSIS — N920 Excessive and frequent menstruation with regular cycle: Secondary | ICD-10-CM | POA: Diagnosis not present

## 2022-12-11 NOTE — Progress Notes (Signed)
  CC: menorrhagia follow up Subjective:    Patient ID: Beverly Johnson, female    DOB: 02/17/1976, 47 y.o.   MRN: [id number]  HPI Pt seen for follow up of menorrhagia.  Endometrial biopsy was normal. Recent CT showed a large intramural fibroid with calcifications.  Pt noted  minimal change in bleeding with lysteda.  Discussed progesterone IUD versus UFE for cntinued treatment.  Pt is still considering choices.   Review of Systems     Objective:   Physical Exam Vitals:   12/10/22 1409 12/10/22 1437  BP: (!) 149/87 (!) 138/95  Pulse: 91 89         Assessment & Plan:   1. Menorrhagia with regular cycle Pt considering treatment options for menorrhagia.  Will follow in 6 weeks with potential progesterone IUD placement.  If this second tier of treatment fails, would need to consider possible hysterectomy for definitive therapy.  I spent 10 minutes dedicated to the care of this patient including previsit review of records, face to face time with the patient discussing treatment options and post visit testing.     Warden Fillers, MD Faculty Attending, [facility]

## 2022-12-14 ENCOUNTER — Other Ambulatory Visit: Payer: Self-pay | Admitting: Physical Medicine and Rehabilitation

## 2022-12-28 ENCOUNTER — Ambulatory Visit: Payer: Medicaid Other | Attending: Physical Medicine and Rehabilitation | Admitting: Physical Therapy

## 2023-01-13 ENCOUNTER — Other Ambulatory Visit: Payer: Self-pay | Admitting: Obstetrics and Gynecology

## 2023-01-13 DIAGNOSIS — N920 Excessive and frequent menstruation with regular cycle: Secondary | ICD-10-CM

## 2023-02-03 ENCOUNTER — Telehealth: Payer: Self-pay

## 2023-02-03 ENCOUNTER — Other Ambulatory Visit: Payer: Self-pay

## 2023-02-03 ENCOUNTER — Encounter: Payer: Self-pay | Admitting: Obstetrics and Gynecology

## 2023-02-03 ENCOUNTER — Ambulatory Visit (INDEPENDENT_AMBULATORY_CARE_PROVIDER_SITE_OTHER): Payer: Medicaid Other | Admitting: Obstetrics and Gynecology

## 2023-02-03 VITALS — BP 131/95 | HR 81 | Ht 67.0 in | Wt 276.8 lb

## 2023-02-03 DIAGNOSIS — N920 Excessive and frequent menstruation with regular cycle: Secondary | ICD-10-CM

## 2023-02-03 LAB — CBC
Hematocrit: 34.9 % (ref 34.0–46.6)
Hemoglobin: 10.6 g/dL — ABNORMAL LOW (ref 11.1–15.9)
MCH: 23.5 pg — ABNORMAL LOW (ref 26.6–33.0)
MCHC: 30.4 g/dL — ABNORMAL LOW (ref 31.5–35.7)
MCV: 77 fL — ABNORMAL LOW (ref 79–97)
Platelets: 176 10*3/uL (ref 150–450)
RBC: 4.51 x10E6/uL (ref 3.77–5.28)
RDW: 16.5 % — ABNORMAL HIGH (ref 11.7–15.4)
WBC: 3.5 10*3/uL (ref 3.4–10.8)

## 2023-02-03 NOTE — Progress Notes (Signed)
Patient was advised to consult with her PCP regarding her elevated blood pressure. Patient refused due to not wanting to consume medication

## 2023-02-04 NOTE — Progress Notes (Signed)
  CC: gyn follow up Subjective:    Patient ID: Beverly Johnson, female    DOB: 02-12-1976, 47 y.o.   MRN: [id number]  HPI Pt seen for follow up on menorrhagia.  Again discussed calcified fibroid.  Pt states that her bleeding was improved and lasted 3 days.  Pt still is using the lysteda which seems to have decreased some bleeding.   Review of Systems     Objective:   Physical Exam Vitals:   02/03/23 1104 02/03/23 1122  BP: (!) 135/93 (!) 131/95  Pulse: 79 81         Assessment & Plan:   1. Menorrhagia with regular cycle Check cbc due to previous anemia. Continue expectant management regarding menstrual bleeding.  If bleeding worsens consider UFE or hysterectomy. - CBC  I spent 10 minutes dedicated to the care of this patient including previsit review of records, face to face time with the patient discussing treatment options and post visit testing.   Warden Fillers, MD Faculty Attending, [facility]

## 2023-02-05 NOTE — Telephone Encounter (Signed)
error 

## 2023-02-23 ENCOUNTER — Ambulatory Visit: Payer: Self-pay | Admitting: Nurse Practitioner

## 2023-03-02 ENCOUNTER — Ambulatory Visit (INDEPENDENT_AMBULATORY_CARE_PROVIDER_SITE_OTHER): Payer: Medicaid Other | Admitting: Nurse Practitioner

## 2023-03-02 ENCOUNTER — Encounter: Payer: Self-pay | Admitting: Nurse Practitioner

## 2023-03-02 ENCOUNTER — Telehealth: Payer: Self-pay

## 2023-03-02 VITALS — BP 134/87 | HR 87 | Temp 97.2°F | Ht 67.0 in | Wt 284.6 lb

## 2023-03-02 DIAGNOSIS — E1165 Type 2 diabetes mellitus with hyperglycemia: Secondary | ICD-10-CM

## 2023-03-02 DIAGNOSIS — Z6841 Body Mass Index (BMI) 40.0 and over, adult: Secondary | ICD-10-CM | POA: Diagnosis not present

## 2023-03-02 DIAGNOSIS — I10 Essential (primary) hypertension: Secondary | ICD-10-CM | POA: Diagnosis not present

## 2023-03-02 DIAGNOSIS — E1140 Type 2 diabetes mellitus with diabetic neuropathy, unspecified: Secondary | ICD-10-CM | POA: Insufficient documentation

## 2023-03-02 HISTORY — DX: Essential (primary) hypertension: I10

## 2023-03-02 HISTORY — DX: Type 2 diabetes mellitus with hyperglycemia: E11.65

## 2023-03-02 LAB — POCT GLYCOSYLATED HEMOGLOBIN (HGB A1C): Hemoglobin A1C: 8.8 % — AB (ref 4.0–5.6)

## 2023-03-02 MED ORDER — INSULIN GLARGINE SOLOSTAR 100 UNIT/ML ~~LOC~~ SOPN
48.0000 [IU] | PEN_INJECTOR | Freq: Two times a day (BID) | SUBCUTANEOUS | 2 refills | Status: DC
Start: 2023-03-02 — End: 2023-05-07

## 2023-03-02 MED ORDER — DEXCOM G7 SENSOR MISC: 3 refills | Status: DC

## 2023-03-02 MED ORDER — LISINOPRIL 5 MG PO TABS: 5.0000 mg | ORAL_TABLET | Freq: Every day | ORAL | 1 refills | Status: DC

## 2023-03-02 NOTE — Addendum Note (Signed)
Addended byRaj Janus on: 03/02/2023 01:26 PM   Modules accepted: Orders

## 2023-03-02 NOTE — Assessment & Plan Note (Signed)
Importance of regular foot exam discussed States that she sees a podiatrist regularly Has no sensation on both feet on examination using a monofilament.

## 2023-03-02 NOTE — Assessment & Plan Note (Signed)
Wt Readings from Last 3 Encounters:  03/02/23 284 lb 9.6 oz (129.1 kg)  02/03/23 276 lb 12.8 oz (125.6 kg)  12/10/22 274 lb 9.6 oz (124.6 kg)   Body mass index is 44.57 kg/m.  Patient refused starting a GLP-1 On a high dose of Lantus which could be contributing to her weight gain Patient counseled on low-carb modified diet Encouraged to engage in regular moderate to vigorous exercises at least 150 minutes weekly

## 2023-03-02 NOTE — Assessment & Plan Note (Signed)
BP Readings from Last 3 Encounters:  03/02/23 134/87  02/03/23 (!) 131/95  12/10/22 (!) 138/95   HTN Blood pressure goal is less than 130/80, start lisinopril 5 mg daily Continue current medications. No changes in management. Discussed DASH diet and dietary sodium restrictions Continue to increase dietary efforts and exercise.  Follow-up in 4 weeks

## 2023-03-02 NOTE — Telephone Encounter (Signed)
Completed. GH

## 2023-03-02 NOTE — Assessment & Plan Note (Signed)
Lab Results  Component Value Date   HGBA1C 8.8 (A) 03/02/2023  Diabetes remains uncontrolled but A1c is much better at 8.8 Patient congratulated on her efforts at getting her diabetes under control I discussed adding metformin or an SGLT2 or GLP-1 but the patient refused Continue Lantus take 48 units twice daily CGM ordered as she does not check her blood sugar with her glucose meter Signs of hypoglycemia reviewed Patient counseled on low-carb modified diet CBG goals discussed Follow-up in 3 months Checking lipid panel she is currently not on a statin

## 2023-03-02 NOTE — Patient Instructions (Addendum)
.   Uncontrolled type 2 diabetes mellitus with hyperglycemia (HCC)  - Insulin Glargine Solostar (LANTUS) 100 UNIT/ML Solostar Pen; Inject 48 Units into the skin 2 (two) times daily.  Dispense: 15 mL; Refill: 2    It is important that you exercise regularly at least 30 minutes 5 times a week as tolerated  Think about what you will eat, plan ahead. Choose " clean, green, fresh or frozen" over canned, processed or packaged foods which are more sugary, salty and fatty. 70 to 75% of food eaten should be vegetables and fruit. Three meals at set times with snacks allowed between meals, but they must be fruit or vegetables. Aim to eat over a 12 hour period , example 7 am to 7 pm, and STOP after  your last meal of the day. Drink water,generally about 64 ounces per day, no other drink is as healthy. Fruit juice is best enjoyed in a healthy way, by EATING the fruit.  Thanks for choosing [facility] we consider it a privelige to serve you.

## 2023-03-02 NOTE — Progress Notes (Signed)
Established Patient Office Visit  Subjective:  Patient ID: Beverly Johnson, female    DOB: [birth year]  Age: 47 y.o. MRN: [id number]  CC:  Chief Complaint  Patient presents with   Follow-up    HPI Beverly Johnson is a 47 y.o. female  has a past medical history of Back pain, DM2 (diabetes mellitus, type 2) (HCC), HIV infection (HCC), and Microalbuminuria due to type 2 diabetes mellitus (HCC).   Patient presents for follow-up for her chronic medical conditions.  Uncontrolled type 2 diabetes.  Has Lantus 46 units daily as ordered but she has been taking lantus  45 units twice daily.  Has a glucometer but does not check her blood sugar.  She denies polyphagia, polyuria, polydipsia.  Not on a statin checking lipid panel today, she has been trying to eat better and also does some walking exercises around her neighborhood.  She is not interested in starting additional medication for her chronic medical conditions.   Patient encouraged to get her Cologuard test done to screen for colon cancer   Past Medical History:  Diagnosis Date   Back pain    DM2 (diabetes mellitus, type 2) (HCC)    HIV infection (HCC)    Microalbuminuria due to type 2 diabetes mellitus (HCC)     Past Surgical History:  Procedure Laterality Date   CESAREAN SECTION     x3   CHOLECYSTECTOMY  06/29/2011   Procedure: LAPAROSCOPIC CHOLECYSTECTOMY WITH INTRAOPERATIVE CHOLANGIOGRAM;  Surgeon: Robyne Askew, MD;  Location: [facility] OR;  Service: General;  Laterality: N/A;   TUBAL LIGATION      Family History  Problem Relation Age of Onset   Healthy Mother    Healthy Father    Breast cancer Neg Hx     Social History   Socioeconomic History   Marital status: Married    Spouse name: Not on file   Number of children: Not on file   Years of education: 10   Highest education level: Not on file  Occupational History    Employer: UNEMPLOYED  Tobacco Use   Smoking status: Never   Smokeless tobacco: Never  Vaping  Use   Vaping status: Never Used  Substance and Sexual Activity   Alcohol use: No   Drug use: Not Currently    Types: Marijuana    Comment: CBD   Sexual activity: Yes    Partners: Male  Other Topics Concern   Not on file  Social History Narrative   Not on file   Social Determinants of Health   Financial Resource Strain: Not on file  Food Insecurity: No Food Insecurity (02/24/2021)   Hunger Vital Sign    Worried About Running Out of Food in the Last Year: Never true    Ran Out of Food in the Last Year: Never true  Transportation Needs: No Transportation Needs (02/24/2021)   PRAPARE - Administrator, Civil Service (Medical): No    Lack of Transportation (Non-Medical): No  Physical Activity: Not on file  Stress: Not on file  Social Connections: Not on file  Intimate Partner Violence: Not on file    Outpatient Medications Prior to Visit  Medication Sig Dispense Refill   GENVOYA 150-150-200-10 MG TABS tablet TAKE ONE TABLET BY MOUTH DAILY WITH BREAKFAST 30 tablet 4   ibuprofen (ADVIL) 800 MG tablet Take 1 tablet (800 mg total) by mouth every 8 (eight) hours as needed. 20 tablet 0   RESTASIS 0.05 % ophthalmic  emulsion      tranexamic acid (LYSTEDA) 650 MG TABS tablet TAKE TWO TABLETS BY MOUTH THREE TIMES A DAY . TAKE DURING MENSES FOR A MAXIMUM OF FIVE DAYS 30 tablet 6   Insulin Glargine Solostar (LANTUS) 100 UNIT/ML Solostar Pen INJECT 46 UNITS INTO THE SKIN DAILY 15 mL 2   ACCU-CHEK GUIDE test strip USE AS INSTRUCTED (Patient not taking: Reported on 03/02/2023) 100 each 11   Accu-Chek Softclix Lancets lancets 1 each 3 (three) times daily. (Patient not taking: Reported on 03/02/2023)     cyclobenzaprine (FLEXERIL) 10 MG tablet Take 1 tablet by mouth 3 times daily as needed for muscle spasm. Warning: May cause drowsiness. (Patient not taking: Reported on 03/02/2023) 21 tablet 0   GLOBAL EASE INJECT PEN NEEDLES 32G X 4 MM MISC  (Patient not taking: Reported on 11/24/2022)      glucose blood test strip Check your sugar in the morning before you eat breakfast, and one hour after a meal. (Patient not taking: Reported on 11/24/2022) 100 each 2   glucose monitoring kit (FREESTYLE) monitoring kit 1 each by Does not apply route daily. Check glucose once in the morning before breakfast and 1 hour after a meal (Patient not taking: Reported on 11/24/2022) 1 each 0   meloxicam (MOBIC) 15 MG tablet Take 1 tablet (15 mg total) by mouth daily. (Patient not taking: Reported on 03/02/2023) 30 tablet 0   No facility-administered medications prior to visit.    No Known Allergies  ROS Review of Systems  Constitutional:  Negative for activity change, appetite change, chills, fatigue and fever.  HENT:  Negative for congestion, dental problem, ear discharge, ear pain, hearing loss, rhinorrhea, sinus pressure, sinus pain, sneezing and sore throat.   Eyes:  Negative for pain, discharge, redness and itching.  Respiratory:  Negative for cough, chest tightness, shortness of breath and wheezing.   Cardiovascular:  Negative for chest pain, palpitations and leg swelling.  Gastrointestinal:  Negative for abdominal distention, abdominal pain, anal bleeding, blood in stool, constipation, diarrhea, nausea, rectal pain and vomiting.  Endocrine: Negative for cold intolerance, heat intolerance, polydipsia, polyphagia and polyuria.  Genitourinary:  Negative for difficulty urinating, dysuria, flank pain, frequency, hematuria, menstrual problem, pelvic pain and vaginal bleeding.  Musculoskeletal:  Negative for arthralgias, back pain, gait problem, joint swelling and myalgias.  Skin:  Negative for color change, pallor, rash and wound.  Allergic/Immunologic: Negative for environmental allergies, food allergies and immunocompromised state.  Neurological:  Negative for dizziness, tremors, facial asymmetry, weakness and headaches.  Hematological:  Negative for adenopathy. Does not bruise/bleed easily.   Psychiatric/Behavioral:  Negative for agitation, behavioral problems, confusion, decreased concentration, hallucinations, self-injury and suicidal ideas.       Objective:    Physical Exam Vitals and nursing note reviewed.  Constitutional:      General: She is not in acute distress.    Appearance: Normal appearance. She is obese. She is not ill-appearing, toxic-appearing or diaphoretic.  HENT:     Mouth/Throat:     Mouth: Mucous membranes are moist.     Pharynx: Oropharynx is clear. No oropharyngeal exudate or posterior oropharyngeal erythema.  Eyes:     General: No scleral icterus.       Right eye: No discharge.        Left eye: No discharge.     Extraocular Movements: Extraocular movements intact.     Conjunctiva/sclera: Conjunctivae normal.  Cardiovascular:     Rate and Rhythm: Normal rate and regular rhythm.  Pulses: Normal pulses.     Heart sounds: Normal heart sounds. No murmur heard.    No friction rub. No gallop.  Pulmonary:     Effort: Pulmonary effort is normal. No respiratory distress.     Breath sounds: Normal breath sounds. No stridor. No wheezing, rhonchi or rales.  Chest:     Chest wall: No tenderness.  Abdominal:     General: There is no distension.     Palpations: Abdomen is soft.     Tenderness: There is no abdominal tenderness. There is no right CVA tenderness, left CVA tenderness or guarding.  Musculoskeletal:        General: No swelling, tenderness, deformity or signs of injury.     Right lower leg: No edema.     Left lower leg: No edema.  Skin:    General: Skin is warm and dry.     Capillary Refill: Capillary refill takes 2 to 3 seconds.     Coloration: Skin is not jaundiced or pale.     Findings: No bruising, erythema or lesion.  Neurological:     Mental Status: She is alert and oriented to person, place, and time.     Motor: No weakness.     Coordination: Coordination normal.     Gait: Gait normal.  Psychiatric:        Mood and Affect:  Mood normal.        Behavior: Behavior normal.        Thought Content: Thought content normal.        Judgment: Judgment normal.     BP 134/87   Pulse 87   Temp (!) 97.2 F (36.2 C)   Ht 5\' 7"  (1.702 m)   Wt 284 lb 9.6 oz (129.1 kg)   LMP 02/03/2023   SpO2 99%   BMI 44.57 kg/m  Wt Readings from Last 3 Encounters:  03/02/23 284 lb 9.6 oz (129.1 kg)  02/03/23 276 lb 12.8 oz (125.6 kg)  12/10/22 274 lb 9.6 oz (124.6 kg)    Lab Results  Component Value Date   TSH 1.105 06/17/2019   Lab Results  Component Value Date   WBC 3.5 02/03/2023   HGB 10.6 (L) 02/03/2023   HCT 34.9 02/03/2023   MCV 77 (L) 02/03/2023   PLT 176 02/03/2023   Lab Results  Component Value Date   NA 131 (L) 11/13/2022   K 3.6 11/13/2022   CO2 23 11/13/2022   GLUCOSE 274 (H) 11/13/2022   BUN 10 11/13/2022   CREATININE 0.76 11/13/2022   BILITOT 0.5 11/13/2022   ALKPHOS 84 11/13/2022   AST 20 11/13/2022   ALT 15 11/13/2022   PROT 7.1 11/13/2022   ALBUMIN 3.1 (L) 11/13/2022   CALCIUM 8.3 (L) 11/13/2022   ANIONGAP 5 11/13/2022   Lab Results  Component Value Date   CHOL 148 11/05/2020   Lab Results  Component Value Date   HDL 56 11/05/2020   Lab Results  Component Value Date   LDLCALC 73 11/05/2020   Lab Results  Component Value Date   TRIG 108 11/05/2020   Lab Results  Component Value Date   CHOLHDL 2.6 11/05/2020   Lab Results  Component Value Date   HGBA1C 8.8 (A) 03/02/2023      Assessment & Plan:   Problem List Items Addressed This Visit       Cardiovascular and Mediastinum   Primary hypertension    BP Readings from Last 3 Encounters:  03/02/23 134/87  02/03/23 (!) 131/95  12/10/22 (!) 138/95   HTN Blood pressure goal is less than 130/80, start lisinopril 5 mg daily Continue current medications. No changes in management. Discussed DASH diet and dietary sodium restrictions Continue to increase dietary efforts and exercise.  Follow-up in 4 weeks         Relevant Medications   lisinopril (ZESTRIL) 5 MG tablet     Endocrine   Uncontrolled type 2 diabetes mellitus with hyperglycemia (HCC) - Primary    Lab Results  Component Value Date   HGBA1C 8.8 (A) 03/02/2023  Diabetes remains uncontrolled but A1c is much better at 8.8 Patient congratulated on her efforts at getting her diabetes under control I discussed adding metformin or an SGLT2 or GLP-1 but the patient refused Continue Lantus take 48 units twice daily CGM ordered as she does not check her blood sugar with her glucose meter Signs of hypoglycemia reviewed Patient counseled on low-carb modified diet CBG goals discussed Follow-up in 3 months Checking lipid panel she is currently not on a statin      Relevant Medications   Insulin Glargine Solostar (LANTUS) 100 UNIT/ML Solostar Pen   lisinopril (ZESTRIL) 5 MG tablet   Other Relevant Orders   POCT glycosylated hemoglobin (Hb A1C) (Completed)   Lipid panel   Ambulatory referral to Ophthalmology   Neuropathy due to type 2 diabetes mellitus (HCC)    Importance of regular foot exam discussed States that she sees a podiatrist regularly Has no sensation on both feet on examination using a monofilament.      Relevant Medications   Insulin Glargine Solostar (LANTUS) 100 UNIT/ML Solostar Pen   lisinopril (ZESTRIL) 5 MG tablet     Other   Obesity    Wt Readings from Last 3 Encounters:  03/02/23 284 lb 9.6 oz (129.1 kg)  02/03/23 276 lb 12.8 oz (125.6 kg)  12/10/22 274 lb 9.6 oz (124.6 kg)   Body mass index is 44.57 kg/m.  Patient refused starting a GLP-1 On a high dose of Lantus which could be contributing to her weight gain Patient counseled on low-carb modified diet Encouraged to engage in regular moderate to vigorous exercises at least 150 minutes weekly      Relevant Medications   Insulin Glargine Solostar (LANTUS) 100 UNIT/ML Solostar Pen    Meds ordered this encounter  Medications   Insulin Glargine Solostar  (LANTUS) 100 UNIT/ML Solostar Pen    Sig: Inject 48 Units into the skin 2 (two) times daily.    Dispense:  15 mL    Refill:  2   lisinopril (ZESTRIL) 5 MG tablet    Sig: Take 1 tablet (5 mg total) by mouth daily.    Dispense:  60 tablet    Refill:  1    Follow-up: Return in about 4 weeks (around 03/30/2023) for FASTING LABS THIS WEEK, HTN.    Donell Beers, FNP

## 2023-03-08 ENCOUNTER — Other Ambulatory Visit: Payer: Self-pay | Admitting: Nurse Practitioner

## 2023-03-08 DIAGNOSIS — E1165 Type 2 diabetes mellitus with hyperglycemia: Secondary | ICD-10-CM

## 2023-03-12 ENCOUNTER — Other Ambulatory Visit: Payer: Self-pay

## 2023-04-01 ENCOUNTER — Emergency Department (HOSPITAL_COMMUNITY): Payer: Medicaid Other

## 2023-04-01 ENCOUNTER — Emergency Department (HOSPITAL_COMMUNITY)
Admission: EM | Admit: 2023-04-01 | Discharge: 2023-04-01 | Disposition: A | Discharge status: Home / Self Care | Payer: Medicaid Other | Attending: Emergency Medicine | Admitting: Emergency Medicine

## 2023-04-01 ENCOUNTER — Other Ambulatory Visit: Payer: Self-pay

## 2023-04-01 ENCOUNTER — Encounter (HOSPITAL_COMMUNITY): Payer: Self-pay

## 2023-04-01 DIAGNOSIS — R0789 Other chest pain: Secondary | ICD-10-CM | POA: Diagnosis not present

## 2023-04-01 DIAGNOSIS — Z794 Long term (current) use of insulin: Secondary | ICD-10-CM | POA: Insufficient documentation

## 2023-04-01 DIAGNOSIS — E1165 Type 2 diabetes mellitus with hyperglycemia: Secondary | ICD-10-CM | POA: Insufficient documentation

## 2023-04-01 DIAGNOSIS — Z1152 Encounter for screening for COVID-19: Secondary | ICD-10-CM | POA: Insufficient documentation

## 2023-04-01 DIAGNOSIS — Z21 Asymptomatic human immunodeficiency virus [HIV] infection status: Secondary | ICD-10-CM | POA: Insufficient documentation

## 2023-04-01 DIAGNOSIS — D72819 Decreased white blood cell count, unspecified: Secondary | ICD-10-CM | POA: Diagnosis not present

## 2023-04-01 DIAGNOSIS — R079 Chest pain, unspecified: Secondary | ICD-10-CM | POA: Diagnosis not present

## 2023-04-01 DIAGNOSIS — R072 Precordial pain: Secondary | ICD-10-CM | POA: Insufficient documentation

## 2023-04-01 LAB — BASIC METABOLIC PANEL
Anion gap: 4 — ABNORMAL LOW (ref 5–15)
BUN: 8 mg/dL (ref 6–20)
CO2: 23 mmol/L (ref 22–32)
Calcium: 8.3 mg/dL — ABNORMAL LOW (ref 8.9–10.3)
Chloride: 106 mmol/L (ref 98–111)
Creatinine, Ser: 0.71 mg/dL (ref 0.44–1.00)
GFR, Estimated: >60 mL/min (ref >60)
Glucose, Bld: 211 mg/dL — ABNORMAL HIGH (ref 70–99)
Potassium: 3.5 mmol/L (ref 3.5–5.1)
Sodium: 133 mmol/L — ABNORMAL LOW (ref 135–145)

## 2023-04-01 LAB — HEPATIC FUNCTION PANEL
ALT: 15 U/L (ref 0–44)
AST: 21 U/L (ref 15–41)
Albumin: 3.1 g/dL — ABNORMAL LOW (ref 3.5–5.0)
Alkaline Phosphatase: 111 U/L (ref 38–126)
Bilirubin, Direct: <0.1 mg/dL (ref 0.0–0.2)
Total Bilirubin: 0.4 mg/dL (ref 0.3–1.2)
Total Protein: 7.7 g/dL (ref 6.5–8.1)

## 2023-04-01 LAB — CBC
HCT: 34.1 % — ABNORMAL LOW (ref 36.0–46.0)
Hemoglobin: 10.4 g/dL — ABNORMAL LOW (ref 12.0–15.0)
MCH: 23.9 pg — ABNORMAL LOW (ref 26.0–34.0)
MCHC: 30.5 g/dL (ref 30.0–36.0)
MCV: 78.2 fL — ABNORMAL LOW (ref 80.0–100.0)
Platelets: 161 10*3/uL (ref 150–400)
RBC: 4.36 MIL/uL (ref 3.87–5.11)
RDW: 13.5 % (ref 11.5–15.5)
WBC: 3.8 10*3/uL — ABNORMAL LOW (ref 4.0–10.5)
nRBC: 0 % (ref 0.0–0.2)

## 2023-04-01 LAB — SARS CORONAVIRUS 2 BY RT PCR: SARS Coronavirus 2 by RT PCR: NEGATIVE

## 2023-04-01 LAB — HCG, QUANTITATIVE, PREGNANCY: hCG, Beta Chain, Quant, S: <1 m[IU]/mL (ref <5)

## 2023-04-01 LAB — D-DIMER, QUANTITATIVE: D-Dimer, Quant: 0.46 ug{FEU}/mL (ref 0.00–0.50)

## 2023-04-01 LAB — TROPONIN I (HIGH SENSITIVITY)
Troponin I (High Sensitivity): 2 ng/L (ref <18)
Troponin I (High Sensitivity): 3 ng/L (ref <18)

## 2023-04-01 MED ORDER — CYCLOBENZAPRINE HCL 10 MG PO TABS
10.0000 mg | ORAL_TABLET | Freq: Once | ORAL | Status: AC
  Administered 2023-04-01: 10 mg via ORAL
  Filled 2023-04-01: qty 1

## 2023-04-01 MED ORDER — KETOROLAC TROMETHAMINE 15 MG/ML IJ SOLN
15.0000 mg | Freq: Once | INTRAMUSCULAR | Status: AC
  Administered 2023-04-01: 15 mg via INTRAVENOUS
  Filled 2023-04-01: qty 1

## 2023-04-01 NOTE — Discharge Instructions (Signed)
You may continue to take over-the-counter anti-inflammatories to help with pain control.  Please purchase these over-the-counter, follow-up with your primary care physician as needed.

## 2023-04-01 NOTE — ED Provider Notes (Signed)
[place] EMERGENCY DEPARTMENT AT [facility] Provider Note   CSN: [id number] Arrival date & time: 04/01/23  7829     History HIV, T2DM Chief Complaint  Patient presents with   Chest Pain    Beverly Johnson is a 47 y.o. female.  47 y.o female with a PMH of HIV on Genvoya, T2DM, obesity, anemia  presents to the ED with a chief complaint of sudden onset of right-sided chest pain which began this morning upon waking up.  She reports the pain radiates down to her right arm, no exacerbating or alleviating factors.  Patient is compliant with all her medications at home.  She has not tried taking any medication for improvement in symptoms.  There is some reproducible pain with palpation along the right side of her chest.  She does feel short of breath when this chest pain comes.  No similar episodes in the past.  Does have an underlying history of diabetes but no prior history of CAD, no tobacco use, no OCPs.  The history is provided by the patient and medical records.  Chest Pain Pain location:  Substernal area Pain quality: sharp   Pain radiates to:  R arm Pain severity:  Moderate Onset quality:  Gradual Duration:  2 hours Timing:  Constant Associated symptoms: shortness of breath   Associated symptoms: no abdominal pain, no back pain, no fever, no nausea and no vomiting        Home Medications Prior to Admission medications   Medication Sig Start Date End Date Taking? Authorizing Provider  Accu-Chek Softclix Lancets lancets 1 each 3 (three) times daily. Patient not taking: Reported on 03/02/2023 02/06/21   [provider]  Continuous Glucose Sensor (DEXCOM G7 SENSOR) MISC Use as directed. 03/02/23   [name], Baird Kay, FNP  cyclobenzaprine (FLEXERIL) 10 MG tablet Take 1 tablet by mouth 3 times daily as needed for muscle spasm. Warning: May cause drowsiness. Patient not taking: Reported on 03/02/2023 11/24/22   Donell Beers, FNP  GENVOYA 150-150-200-10 MG  TABS tablet TAKE ONE TABLET BY MOUTH DAILY WITH BREAKFAST 11/19/22   Comer, Belia Heman, MD  GLOBAL EASE INJECT PEN NEEDLES 32G X 4 MM MISC  11/15/21   [provider]  glucose blood (ACCU-CHEK GUIDE) test strip USE AS INSTRUCTED 03/08/23   Edwin Dada R, FNP  glucose blood test strip Check your sugar in the morning before you eat breakfast, and one hour after a meal. Patient not taking: Reported on 11/24/2022 05/26/19   Domenick Gong, MD  glucose monitoring kit (FREESTYLE) monitoring kit 1 each by Does not apply route daily. Check glucose once in the morning before breakfast and 1 hour after a meal Patient not taking: Reported on 11/24/2022 02/05/21   Barbette Merino, NP  ibuprofen (ADVIL) 800 MG tablet Take 1 tablet (800 mg total) by mouth every 8 (eight) hours as needed. 11/24/22   Donell Beers, FNP  Insulin Glargine Solostar (LANTUS) 100 UNIT/ML Solostar Pen Inject 48 Units into the skin 2 (two) times daily. 03/02/23   [name], Baird Kay, FNP  lisinopril (ZESTRIL) 5 MG tablet Take 1 tablet (5 mg total) by mouth daily. 03/02/23   Donell Beers, FNP  meloxicam (MOBIC) 15 MG tablet Take 1 tablet (15 mg total) by mouth daily. Patient not taking: Reported on 03/02/2023 12/02/22 12/02/23  Juanda Chance, NP  RESTASIS 0.05 % ophthalmic emulsion  11/06/22   [provider]  tranexamic acid (LYSTEDA) 650 MG TABS  tablet TAKE TWO TABLETS BY MOUTH THREE TIMES A DAY . TAKE DURING MENSES FOR A MAXIMUM OF FIVE DAYS 01/13/23   Warden Fillers, MD  cetirizine (ZYRTEC ALLERGY) 10 MG tablet Take 1 tablet (10 mg total) by mouth daily. Patient not taking: Reported on 05/27/2020 11/26/19 11/08/20  Henderly, Britni A, PA-C      Allergies    Patient has no known allergies.    Review of Systems   Review of Systems  Constitutional:  Negative for chills and fever.  HENT:  Negative for sore throat.   Respiratory:  Positive for shortness of breath.   Cardiovascular:  Positive for chest pain.   Gastrointestinal:  Negative for abdominal pain, nausea and vomiting.  Genitourinary:  Negative for flank pain.  Musculoskeletal:  Negative for back pain.  All other systems reviewed and are negative.   Physical Exam Updated Vital Signs BP (!) 164/105 (BP Location: Left Arm)   Pulse 82   Temp 98.3 F (36.8 C) (Oral)   Resp 13   Ht 5\' 7"  (1.702 m)   Wt 129.1 kg   LMP 02/03/2023   SpO2 100%   BMI 44.58 kg/m  Physical Exam Vitals and nursing note reviewed.  Constitutional:      General: She is not in acute distress.    Appearance: She is well-developed. She is not ill-appearing.  HENT:     Head: Normocephalic and atraumatic.     Mouth/Throat:     Pharynx: No oropharyngeal exudate.  Eyes:     Pupils: Pupils are equal, round, and reactive to light.  Cardiovascular:     Rate and Rhythm: Regular rhythm.     Heart sounds: Normal heart sounds.  Pulmonary:     Effort: Pulmonary effort is normal. No respiratory distress.     Breath sounds: Normal breath sounds.  Chest:     Chest wall: Tenderness present.     Comments: Tenderness with palpation of the right chest.  Abdominal:     General: Bowel sounds are normal. There is no distension.     Palpations: Abdomen is soft.     Tenderness: There is no abdominal tenderness.  Musculoskeletal:        General: No tenderness or deformity.     Cervical back: Normal range of motion.     Right lower leg: No edema.     Left lower leg: No edema.  Skin:    General: Skin is warm and dry.  Neurological:     Mental Status: She is alert and oriented to person, place, and time.     ED Results / Procedures / Treatments   Labs (all labs ordered are listed, but only abnormal results are displayed) Labs Reviewed  BASIC METABOLIC PANEL - Abnormal; Notable for the following components:      Result Value   Sodium 133 (*)    Glucose, Bld 211 (*)    Calcium 8.3 (*)    Anion gap 4 (*)    All other components within normal limits  CBC -  Abnormal; Notable for the following components:   WBC 3.8 (*)    Hemoglobin 10.4 (*)    HCT 34.1 (*)    MCV 78.2 (*)    MCH 23.9 (*)    All other components within normal limits  HEPATIC FUNCTION PANEL - Abnormal; Notable for the following components:   Albumin 3.1 (*)    All other components within normal limits  SARS CORONAVIRUS 2 BY RT PCR  HCG, QUANTITATIVE, PREGNANCY  D-DIMER, QUANTITATIVE  TROPONIN I (HIGH SENSITIVITY)  TROPONIN I (HIGH SENSITIVITY)    EKG EKG Interpretation Date/Time:  Thursday April 01 2023 06:24:29 EDT Ventricular Rate:  84 PR Interval:  162 QRS Duration:  79 QT Interval:  347 QTC Calculation: 411 R Axis:   35  Text Interpretation: Sinus rhythm Low voltage, precordial leads Abnormal R-wave progression, early transition Since last tracing rate slower Confirmed by Jacalyn Lefevre [phone]) on 04/01/2023 8:16:38 AM  Radiology DG Chest 2 View  Result Date: 04/01/2023 CLINICAL DATA:  Chest pain EXAM: CHEST - 2 VIEW COMPARISON:  10/20/2021 FINDINGS: Normal heart size and mediastinal contours. No acute infiltrate or edema. No effusion or pneumothorax. No acute osseous findings. IMPRESSION: No active cardiopulmonary disease. Electronically Signed   By: Tiburcio Pea M.D.   On: 04/01/2023 06:45    Procedures Procedures    Medications Ordered in ED Medications  cyclobenzaprine (FLEXERIL) tablet 10 mg (has no administration in time range)  ketorolac (TORADOL) 15 MG/ML injection 15 mg (15 mg Intravenous Given 04/01/23 0755)    ED Course/ Medical Decision Making/ A&P                                 Medical Decision Making Amount and/or Complexity of Data Reviewed Labs: ordered. Radiology: ordered.  Risk Prescription drug management.   This patient presents to the ED for concern of chest pain, this involves a number of treatment options, and is a complaint that carries with it a high risk of complications and morbidity.  The differential diagnosis  includes ACS, PE versus infection.    Co morbidities: Discussed in HPI   Brief History:  See HPI.  EMR reviewed including pt PMHx, past surgical history and past visits to ER.   See HPI for more details   Lab Tests:  I ordered and independently interpreted labs.  The pertinent results include:    CBC with some leukopenia but stable from her last visit.  Hemoglobin is within her baseline.  BMP with no electrolyte derangement, her glucose is slightly elevated but she does have a prior history of diabetes.  Troponin is negative, pregnancy test is also negative.   Imaging Studies:  NAD. I personally reviewed all imaging studies and no acute abnormality found. I agree with radiology interpretation.  Cardiac Monitoring:  The patient was maintained on a cardiac monitor.  I personally viewed and interpreted the cardiac monitored which showed an underlying rhythm of: NSR 84 EKG non-ischemic   Medicines ordered:  I ordered medication including Toradol  for pain control Reevaluation of the patient after these medicines showed that the patient improved I have reviewed the patients home medicines and have made adjustments as needed Reevaluation:  After the interventions noted above I re-evaluated patient and found that they have :improved   Social Determinants of Health:  The patient's social determinants of health were a factor in the care of this patient  Problem List / ED Course:  Patient with underlying HIV, type 2 diabetes here with right-sided chest pain radiating down her right arm which began approximately 2 hours ago while she was at work.  No similar episodes in the past, is compliant with her HIV medication.  Denies any tobacco use, prior history of blood clots.  Pain is reproducible with palpation along the right side of her chest, no tingling, weakness to the right arm.  She is hemodynamically stable  aside from elevated blood pressure with a systolic in the 180s, does  have a history of hypertension and is medicated but unsure whether compliance with medication.  CBC is at her baseline, BMP is unchanged, added hepatic factors although pain seems to be more so upper right chest versus right upper abdomen. Hepatic function is within normal limits, COVID-19 test is negative.  Delta troponins have remained flat, EKG is nonischemic.  Due to patient having some chest pain along with shortness of breath concern for pulmonary embolism, I do feel that she is low risk at this time without any prior risk factors, D-dimer level is negative.  She does have some improvement in her pain after receiving Toradol.  We do suspect some MSK etiology, she does feel like she somewhat feels her muscle spasms, given muscle relaxer prior to discharge from the emergency department.  I discussed with her negative workup, will have her follow-up with her primary care physician at her earliest convenience.  Patient is hemodynamically stable for discharge.  Dispostion:  After consideration of the diagnostic results and the patients response to treatment, I feel that the patent would benefit from outpatient follow-up with primary care.   Portions of this note were generated with Scientist, clinical (histocompatibility and immunogenetics). Dictation errors may occur despite best attempts at proofreading.   Final Clinical Impression(s) / ED Diagnoses Final diagnoses:  Chest wall pain    Rx / DC Orders ED Discharge Orders     None         Claude Manges, PA-C 04/01/23 1007    Jacalyn Lefevre, MD 04/01/23 1041

## 2023-04-01 NOTE — ED Triage Notes (Signed)
Pt states that she started having sharp upper right chest pain that is causing shob due to pain.

## 2023-04-02 ENCOUNTER — Ambulatory Visit (INDEPENDENT_AMBULATORY_CARE_PROVIDER_SITE_OTHER): Payer: Medicaid Other | Admitting: Nurse Practitioner

## 2023-04-02 ENCOUNTER — Encounter: Payer: Self-pay | Admitting: Nurse Practitioner

## 2023-04-02 VITALS — BP 136/79 | HR 89 | Ht 67.0 in | Wt 287.2 lb

## 2023-04-02 DIAGNOSIS — E1165 Type 2 diabetes mellitus with hyperglycemia: Secondary | ICD-10-CM

## 2023-04-02 DIAGNOSIS — R0789 Other chest pain: Secondary | ICD-10-CM | POA: Diagnosis not present

## 2023-04-02 DIAGNOSIS — I10 Essential (primary) hypertension: Secondary | ICD-10-CM

## 2023-04-02 DIAGNOSIS — Z1211 Encounter for screening for malignant neoplasm of colon: Secondary | ICD-10-CM

## 2023-04-02 MED ORDER — LISINOPRIL 5 MG PO TABS
5.0000 mg | ORAL_TABLET | Freq: Every day | ORAL | 1 refills | Status: DC
Start: 2023-04-02 — End: 2023-12-22

## 2023-04-02 NOTE — Progress Notes (Signed)
Established Patient Office Visit  Subjective:  Patient ID: Beverly Johnson, female    DOB: 21-Oct-1975  Age: 47 y.o. MRN: [id number]  CC:  Chief Complaint  Patient presents with   Medical Management of Chronic Issues    HPI Beverly Johnson is a 47 y.o. female  has a past medical history of Back pain, DM2 (diabetes mellitus, type 2) (HCC), HIV infection (HCC), and Microalbuminuria due to type 2 diabetes mellitus (HCC).  Patient presents for follow-up for hypertension  Hypertension.  Currently on lisinopril 5 mg daily.  Patient stated that he has been taking the medication daily as ordered.  No complaints of shortness of breath dizziness edema. She does not check her blood pressure at home.  Uncontrolled type 2 diabetes.  Takes Lantus 48 units twice daily.  No complaints of polyphagia polyuria.  She was at the ED yesterday for c/o chest pain.  Labs with stable troponin negative, EKG nonischemic.  Daily suspected some musculoskeletal etiology.  Patient stated that her chest pain is better today.   Past Medical History:  Diagnosis Date   Back pain    DM2 (diabetes mellitus, type 2) (HCC)    HIV infection (HCC)    Microalbuminuria due to type 2 diabetes mellitus (HCC)     Past Surgical History:  Procedure Laterality Date   CESAREAN SECTION     x3   CHOLECYSTECTOMY  06/29/2011   Procedure: LAPAROSCOPIC CHOLECYSTECTOMY WITH INTRAOPERATIVE CHOLANGIOGRAM;  Surgeon: Robyne Askew, MD;  Location: [facility] OR;  Service: General;  Laterality: N/A;   TUBAL LIGATION      Family History  Problem Relation Age of Onset   Healthy Mother    Healthy Father    Breast cancer Neg Hx     Social History   Socioeconomic History   Marital status: Married    Spouse name: Not on file   Number of children: Not on file   Years of education: 10   Highest education level: Not on file  Occupational History    Employer: UNEMPLOYED  Tobacco Use   Smoking status: Never   Smokeless tobacco: Never   Vaping Use   Vaping status: Never Used  Substance and Sexual Activity   Alcohol use: No   Drug use: Not Currently    Types: Marijuana    Comment: CBD   Sexual activity: Yes    Partners: Male  Other Topics Concern   Not on file  Social History Narrative   Not on file   Social Determinants of Health   Financial Resource Strain: Not on file  Food Insecurity: No Food Insecurity (02/24/2021)   Hunger Vital Sign    Worried About Running Out of Food in the Last Year: Never true    Ran Out of Food in the Last Year: Never true  Transportation Needs: No Transportation Needs (02/24/2021)   PRAPARE - Administrator, Civil Service (Medical): No    Lack of Transportation (Non-Medical): No  Physical Activity: Not on file  Stress: Not on file  Social Connections: Not on file  Intimate Partner Violence: Not on file    Outpatient Medications Prior to Visit  Medication Sig Dispense Refill   Continuous Glucose Sensor (DEXCOM G7 SENSOR) MISC Use as directed. 4 each 3   GENVOYA 150-150-200-10 MG TABS tablet TAKE ONE TABLET BY MOUTH DAILY WITH BREAKFAST 30 tablet 4   glucose blood (ACCU-CHEK GUIDE) test strip USE AS INSTRUCTED 100 each 10   ibuprofen (  ADVIL) 800 MG tablet Take 1 tablet (800 mg total) by mouth every 8 (eight) hours as needed. 20 tablet 0   Insulin Glargine Solostar (LANTUS) 100 UNIT/ML Solostar Pen Inject 48 Units into the skin 2 (two) times daily. 15 mL 2   RESTASIS 0.05 % ophthalmic emulsion      tranexamic acid (LYSTEDA) 650 MG TABS tablet TAKE TWO TABLETS BY MOUTH THREE TIMES A DAY . TAKE DURING MENSES FOR A MAXIMUM OF FIVE DAYS 30 tablet 6   lisinopril (ZESTRIL) 5 MG tablet Take 1 tablet (5 mg total) by mouth daily. 60 tablet 1   Accu-Chek Softclix Lancets lancets 1 each 3 (three) times daily. (Patient not taking: Reported on 03/02/2023)     cyclobenzaprine (FLEXERIL) 10 MG tablet Take 1 tablet by mouth 3 times daily as needed for muscle spasm. Warning: May cause  drowsiness. (Patient not taking: Reported on 03/02/2023) 21 tablet 0   GLOBAL EASE INJECT PEN NEEDLES 32G X 4 MM MISC  (Patient not taking: Reported on 11/24/2022)     glucose blood test strip Check your sugar in the morning before you eat breakfast, and one hour after a meal. (Patient not taking: Reported on 11/24/2022) 100 each 2   glucose monitoring kit (FREESTYLE) monitoring kit 1 each by Does not apply route daily. Check glucose once in the morning before breakfast and 1 hour after a meal (Patient not taking: Reported on 11/24/2022) 1 each 0   meloxicam (MOBIC) 15 MG tablet Take 1 tablet (15 mg total) by mouth daily. (Patient not taking: Reported on 03/02/2023) 30 tablet 0   No facility-administered medications prior to visit.    No Known Allergies  ROS Review of Systems  Constitutional:  Negative for activity change, appetite change, chills, fatigue and fever.  HENT:  Negative for congestion, dental problem, ear discharge, ear pain, hearing loss, rhinorrhea, sinus pressure, sinus pain, sneezing and sore throat.   Eyes:  Negative for pain, discharge, redness and itching.  Respiratory:  Negative for cough, chest tightness, shortness of breath and wheezing.   Cardiovascular:  Negative for chest pain, palpitations and leg swelling.  Gastrointestinal:  Negative for abdominal distention, abdominal pain, anal bleeding, blood in stool and constipation.  Endocrine: Negative for cold intolerance, heat intolerance, polydipsia, polyphagia and polyuria.  Genitourinary:  Negative for difficulty urinating, dysuria, flank pain, frequency, hematuria, menstrual problem, pelvic pain and vaginal bleeding.  Musculoskeletal:  Negative for back pain, gait problem, joint swelling and myalgias.  Skin:  Negative for color change, pallor, rash and wound.  Allergic/Immunologic: Negative for environmental allergies, food allergies and immunocompromised state.  Neurological:  Negative for dizziness, tremors, facial  asymmetry, weakness and headaches.  Hematological:  Negative for adenopathy. Does not bruise/bleed easily.  Psychiatric/Behavioral:  Negative for agitation, behavioral problems, confusion, decreased concentration, hallucinations, self-injury and suicidal ideas.       Objective:    Physical Exam Vitals and nursing note reviewed.  Constitutional:      General: She is not in acute distress.    Appearance: Normal appearance. She is obese. She is not ill-appearing, toxic-appearing or diaphoretic.  HENT:     Mouth/Throat:     Mouth: Mucous membranes are moist.     Pharynx: Oropharynx is clear. No oropharyngeal exudate or posterior oropharyngeal erythema.  Eyes:     General: No scleral icterus.       Right eye: No discharge.        Left eye: No discharge.     Extraocular  Movements: Extraocular movements intact.     Conjunctiva/sclera: Conjunctivae normal.  Cardiovascular:     Rate and Rhythm: Normal rate and regular rhythm.     Pulses: Normal pulses.     Heart sounds: Normal heart sounds. No murmur heard.    No friction rub. No gallop.  Pulmonary:     Effort: Pulmonary effort is normal. No respiratory distress.     Breath sounds: Normal breath sounds. No stridor. No wheezing, rhonchi or rales.  Chest:     Chest wall: No tenderness.  Abdominal:     General: There is no distension.     Palpations: Abdomen is soft.     Tenderness: There is no abdominal tenderness. There is no right CVA tenderness, left CVA tenderness or guarding.  Musculoskeletal:        General: No swelling, tenderness, deformity or signs of injury.     Right lower leg: No edema.     Left lower leg: No edema.  Skin:    General: Skin is warm and dry.     Capillary Refill: Capillary refill takes less than 2 seconds.     Coloration: Skin is not jaundiced or pale.     Findings: No bruising, erythema or lesion.  Neurological:     Mental Status: She is alert and oriented to person, place, and time.     Motor: No  weakness.     Coordination: Coordination normal.     Gait: Gait normal.  Psychiatric:        Mood and Affect: Mood normal.        Behavior: Behavior normal.        Thought Content: Thought content normal.        Judgment: Judgment normal.     Comments: Flat affect     BP 136/79   Pulse 89   Ht 5\' 7"  (1.702 m)   Wt 287 lb 3.2 oz (130.3 kg)   SpO2 100%   BMI 44.98 kg/m  Wt Readings from Last 3 Encounters:  04/02/23 287 lb 3.2 oz (130.3 kg)  04/01/23 284 lb 9.8 oz (129.1 kg)  03/02/23 284 lb 9.6 oz (129.1 kg)    Lab Results  Component Value Date   TSH 1.105 06/17/2019   Lab Results  Component Value Date   WBC 3.8 (L) 04/01/2023   HGB 10.4 (L) 04/01/2023   HCT 34.1 (L) 04/01/2023   MCV 78.2 (L) 04/01/2023   PLT 161 04/01/2023   Lab Results  Component Value Date   NA 133 (L) 04/01/2023   K 3.5 04/01/2023   CO2 23 04/01/2023   GLUCOSE 211 (H) 04/01/2023   BUN 8 04/01/2023   CREATININE 0.71 04/01/2023   BILITOT 0.4 04/01/2023   ALKPHOS 111 04/01/2023   AST 21 04/01/2023   ALT 15 04/01/2023   PROT 7.7 04/01/2023   ALBUMIN 3.1 (L) 04/01/2023   CALCIUM 8.3 (L) 04/01/2023   ANIONGAP 4 (L) 04/01/2023   Lab Results  Component Value Date   CHOL 148 11/05/2020   Lab Results  Component Value Date   HDL 56 11/05/2020   Lab Results  Component Value Date   LDLCALC 73 11/05/2020   Lab Results  Component Value Date   TRIG 108 11/05/2020   Lab Results  Component Value Date   CHOLHDL 2.6 11/05/2020   Lab Results  Component Value Date   HGBA1C 8.8 (A) 03/02/2023      Assessment & Plan:   Problem List Items Addressed This  Visit       Cardiovascular and Mediastinum   Primary hypertension    BP Readings from Last 3 Encounters:  04/02/23 136/79  04/01/23 (!) 153/102  03/02/23 134/87    On lisinopril 5 mg daily, states that she has been taking the medication daily but she has not taken it today Continue current medications. No changes in  management. Discussed DASH diet and dietary sodium restrictions Continue to increase dietary efforts and exercise.         Relevant Medications   lisinopril (ZESTRIL) 5 MG tablet     Endocrine   Uncontrolled type 2 diabetes mellitus with hyperglycemia (HCC)    currently not on a statin, lipid panel ordered at last visit for the patient has not done the test, she is not fasting today.  She was encouraged to get the fasting lipid panel done Continue Lantus 48 units twice daily, CBG goals provided. Patient counseled on low-carb modified diet Follow-up as planned      Relevant Medications   lisinopril (ZESTRIL) 5 MG tablet     Other   Chest wall pain    Was evaluated at the ED yesterday , labs EKG were normal Pain thought to be MSK  Stated that her pain feels better today She has ibuprofen and Flexeril as needed ordered       Other Visit Diagnoses     Screening for colon cancer    -  Primary   Relevant Orders   Cologuard   Lipid panel       Meds ordered this encounter  Medications   lisinopril (ZESTRIL) 5 MG tablet    Sig: Take 1 tablet (5 mg total) by mouth daily.    Dispense:  60 tablet    Refill:  1    Follow-up: Return in about 10 weeks (around 06/11/2023) for DM, HTN, FASTING LABS THIS WEEK.    Donell Beers, FNP

## 2023-04-02 NOTE — Assessment & Plan Note (Addendum)
currently not on a statin, lipid panel ordered at last visit for the patient has not done the test, she is not fasting today.  She was encouraged to get the fasting lipid panel done Continue Lantus 48 units twice daily, CBG goals provided. Patient counseled on low-carb modified diet Follow-up as planned

## 2023-04-02 NOTE — Patient Instructions (Addendum)
Please get your fasting labs done next week as dicussed  Get TDAP vaccine at the pharmacy  Please get the Cologuard test done to screen for colon cancer    Goal for fasting blood sugar ranges from 80 to 120 and 2 hours after any meal or at bedtime should be between 130 to 170.    Around 3 times per week, check your blood pressure 2 times per day. once in the morning and once in the evening. The readings should be at least one minute apart. Write down these values and bring them to your next nurse visit/appointment.  When you check your BP, make sure you have been doing something calm/relaxing 5 minutes prior to checking. Both feet should be flat on the floor and you should be sitting. Use your left arm and make sure it is in a relaxed position (on a table), and that the cuff is at the approximate level/height of your heart.   Blood pressure goal is less than 130/80   It is important that you exercise regularly at least 30 minutes 5 times a week as tolerated  Think about what you will eat, plan ahead. Choose " clean, green, fresh or frozen" over canned, processed or packaged foods which are more sugary, salty and fatty. 70 to 75% of food eaten should be vegetables and fruit. Three meals at set times with snacks allowed between meals, but they must be fruit or vegetables. Aim to eat over a 12 hour period , example 7 am to 7 pm, and STOP after  your last meal of the day. Drink water,generally about 64 ounces per day, no other drink is as healthy. Fruit juice is best enjoyed in a healthy way, by EATING the fruit.  Thanks for choosing [facility] we consider it a privelige to serve you.

## 2023-04-02 NOTE — Assessment & Plan Note (Signed)
Was evaluated at the ED yesterday , labs EKG were normal Pain thought to be MSK  Stated that her pain feels better today She has ibuprofen and Flexeril as needed ordered

## 2023-04-02 NOTE — Assessment & Plan Note (Signed)
BP Readings from Last 3 Encounters:  04/02/23 136/79  04/01/23 (!) 153/102  03/02/23 134/87    On lisinopril 5 mg daily, states that she has been taking the medication daily but she has not taken it today Continue current medications. No changes in management. Discussed DASH diet and dietary sodium restrictions Continue to increase dietary efforts and exercise.

## 2023-04-07 ENCOUNTER — Telehealth: Payer: Self-pay

## 2023-04-07 ENCOUNTER — Other Ambulatory Visit: Payer: Self-pay | Admitting: Nurse Practitioner

## 2023-04-07 ENCOUNTER — Other Ambulatory Visit: Payer: Self-pay | Admitting: Internal Medicine

## 2023-04-07 DIAGNOSIS — B20 Human immunodeficiency virus [HIV] disease: Secondary | ICD-10-CM

## 2023-04-07 NOTE — Telephone Encounter (Signed)
Called patient, but she was unavailable at the time - Left HIPAA compliant message with spouse to return our call.

## 2023-05-06 ENCOUNTER — Other Ambulatory Visit: Payer: Self-pay | Admitting: Nurse Practitioner

## 2023-05-06 ENCOUNTER — Other Ambulatory Visit: Payer: Self-pay | Admitting: Internal Medicine

## 2023-05-06 DIAGNOSIS — E1165 Type 2 diabetes mellitus with hyperglycemia: Secondary | ICD-10-CM

## 2023-05-06 DIAGNOSIS — B20 Human immunodeficiency virus [HIV] disease: Secondary | ICD-10-CM

## 2023-05-12 ENCOUNTER — Encounter (HOSPITAL_COMMUNITY): Payer: Self-pay

## 2023-05-12 ENCOUNTER — Ambulatory Visit (HOSPITAL_COMMUNITY)
Admission: EM | Admit: 2023-05-12 | Discharge: 2023-05-12 | Disposition: A | Discharge status: Home / Self Care | Payer: Medicaid Other | Attending: Internal Medicine | Admitting: Internal Medicine

## 2023-05-12 DIAGNOSIS — N3090 Cystitis, unspecified without hematuria: Secondary | ICD-10-CM | POA: Diagnosis not present

## 2023-05-12 LAB — POCT URINALYSIS DIP (MANUAL ENTRY)
Bilirubin, UA: NEGATIVE
Glucose, UA: NEGATIVE mg/dL
Ketones, POC UA: NEGATIVE mg/dL
Nitrite, UA: NEGATIVE
Protein Ur, POC: NEGATIVE mg/dL
Spec Grav, UA: >=1.03 — AB (ref 1.010–1.025)
Urobilinogen, UA: 0.2 E.U./dL
pH, UA: 5.5 (ref 5.0–8.0)

## 2023-05-12 MED ORDER — SULFAMETHOXAZOLE-TRIMETHOPRIM 800-160 MG PO TABS: 1.0000 | ORAL_TABLET | Freq: Two times a day (BID) | ORAL | 0 refills | Status: AC

## 2023-05-12 MED ORDER — PHENAZOPYRIDINE HCL 95 MG PO TABS: 95.0000 mg | ORAL_TABLET | Freq: Three times a day (TID) | ORAL | 0 refills | Status: DC | PRN

## 2023-05-12 NOTE — Discharge Instructions (Addendum)
Take the antibiotics twice daily with food to treat your urinary tract infection.  You can also take the Pyridium 3 times daily for any discomfort or burning.  This will change the color of your secretions.  Ensure you are drinking at least 64 ounces of water.  We will contact you if we need to modify your antibiotic therapy based on your culture results.  Return to clinic for any new or urgent symptoms.

## 2023-05-12 NOTE — ED Provider Notes (Signed)
[facility]    CSN: [id number] Arrival date & time: 05/12/23  1714      History   Chief Complaint Chief Complaint  Patient presents with   Urinary Tract Infection    HPI Beverly Johnson is a 47 y.o. female.   Patient presents to clinic with complaint of dysuria, urgency and frequency present for the past two days.   She denies any nausea, emesis, fever, flank pain, vomiting or vaginal discharge.   The history is provided by the patient and medical records.  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   Bilateral leg edema 11/24/2022   Lymphadenopathy, abdominal 11/24/2022   Acute low back pain without sciatica 11/11/2022   Menorrhagia 11/05/2022   Anemia 03/09/2022   Leukopenia 11/25/2021   Need for prophylactic vaccination and inoculation against influenza 05/28/2021   Adenomyosis 02/24/2021   Vulvovaginal candidiasis 01/16/2021   Dysmenorrhea 01/16/2021   Callus of foot 11/26/2020   Heavy menses 11/26/2020   History of 2019 novel coronavirus disease (COVID-19) 06/16/2019   Abnormal ECG 06/16/2019   Medication monitoring encounter 11/12/2017   Breast nodule 09/02/2017   Screening examination for venereal disease 03/30/2017   Encounter for long-term (current) use of high-risk medication 03/30/2017   Microalbuminuria due to type 2 diabetes mellitus (HCC) 03/30/2017   Knee pain, chronic 03/30/2017   Chest wall pain 03/21/2017   Vitamin D deficiency 03/21/2017    Healthcare maintenance 03/02/2014   Obesity 04/27/2012   Type 2 diabetes mellitus without complications (HCC) 12/08/2010   Human immunodeficiency virus (HIV) disease (HCC) 09/29/2006    Past Surgical History:  Procedure Laterality Date   CESAREAN SECTION     x3   CHOLECYSTECTOMY  06/29/2011   Procedure: LAPAROSCOPIC CHOLECYSTECTOMY WITH INTRAOPERATIVE CHOLANGIOGRAM;  Surgeon: Robyne Askew, MD;  Location: [facility] OR;  Service: General;  Laterality: N/A;   TUBAL LIGATION      OB History     Gravida  3   Para  3   Term  3   Preterm      AB      Living  3      SAB      IAB      Ectopic      Multiple      Live Births  3        Obstetric Comments  c-section x 3. Last one with BTL done in the mid 2000s          Home Medications    Prior to Admission medications   Medication Sig Start Date End Date Taking? Authorizing Provider  phenazopyridine (PYRIDIUM) 95 MG tablet Take 1 tablet (95 mg total) by mouth 3 (three) times daily as needed for pain. 05/12/23  Yes Rinaldo Ratel, [place] N, FNP  sulfamethoxazole-trimethoprim (BACTRIM DS) 800-160 MG tablet Take 1 tablet by mouth 2 (two) times daily for 3 days. 05/12/23 05/15/23 Yes Richetta Cubillos, [place] N, FNP  Accu-Chek  Softclix Lancets lancets 1 each 3 (three) times daily. Patient not taking: Reported on 03/02/2023 02/06/21   [provider]  Continuous Glucose Sensor (DEXCOM G7 SENSOR) MISC Use as directed. 03/02/23   [name], Baird Kay, FNP  cyclobenzaprine (FLEXERIL) 10 MG tablet Take 1 tablet by mouth 3 times daily as needed for muscle spasm. Warning: May cause drowsiness. Patient not taking: Reported on 03/02/2023 11/24/22   Donell Beers, FNP  GENVOYA 150-150-200-10 MG TABS tablet TAKE ONE TABLET BY MOUTH DAILY WITH BREAKFAST 05/07/23   Comer, Belia Heman, MD  GLOBAL EASE INJECT PEN NEEDLES 32G X 4 MM MISC  11/15/21   [provider]  glucose blood (ACCU-CHEK GUIDE) test strip USE AS INSTRUCTED 03/08/23    Edwin Dada R, FNP  glucose blood test strip Check your sugar in the morning before you eat breakfast, and one hour after a meal. Patient not taking: Reported on 11/24/2022 05/26/19   Domenick Gong, MD  glucose monitoring kit (FREESTYLE) monitoring kit 1 each by Does not apply route daily. Check glucose once in the morning before breakfast and 1 hour after a meal Patient not taking: Reported on 11/24/2022 02/05/21   Barbette Merino, NP  ibuprofen (ADVIL) 800 MG tablet Take 1 tablet (800 mg total) by mouth every 8 (eight) hours as needed. 11/24/22   [name], Baird Kay, FNP  insulin glargine-yfgn (SEMGLEE) 100 UNIT/ML Pen INJECT 48 UNITS INTO THE SKIN TWICE A DAY 05/07/23   [name], [name], FNP  lisinopril (ZESTRIL) 5 MG tablet Take 1 tablet (5 mg total) by mouth daily. 04/02/23   Donell Beers, FNP  RESTASIS 0.05 % ophthalmic emulsion  11/06/22   [provider]  tranexamic acid (LYSTEDA) 650 MG TABS tablet TAKE TWO TABLETS BY MOUTH THREE TIMES A DAY . TAKE DURING MENSES FOR A MAXIMUM OF FIVE DAYS 01/13/23   Warden Fillers, MD  cetirizine (ZYRTEC ALLERGY) 10 MG tablet Take 1 tablet (10 mg total) by mouth daily. Patient not taking: Reported on 05/27/2020 11/26/19 11/08/20  Henderly, Britni A, PA-C    Family History Family History  Problem Relation Age of Onset   Healthy Mother    Healthy Father    Breast cancer Neg Hx     Social History Social History   Tobacco Use   Smoking status: Never   Smokeless tobacco: Never  Vaping Use   Vaping status: Never Used  Substance Use Topics   Alcohol use: No   Drug use: Not Currently    Types: Marijuana    Comment: CBD     Allergies   Patient has no known allergies.   Review of Systems Review of Systems  Constitutional:  Negative for fever.  Gastrointestinal:  Negative for abdominal pain, nausea and vomiting.  Genitourinary:  Positive for dysuria, frequency and urgency. Negative for flank pain and vaginal discharge.   Musculoskeletal:  Negative for back pain.     Physical Exam Triage Vital Signs ED Triage Vitals  Encounter Vitals Group     BP 05/12/23 1845 137/84     Systolic BP Percentile --      Diastolic BP Percentile --      Pulse Rate 05/12/23 1845 (!) 107     Resp 05/12/23 1845 18     Temp 05/12/23 1845 98.7 F (37.1 C)     Temp Source 05/12/23 1845 Oral     SpO2 05/12/23 1845 98 %     Weight --  Height --      Head Circumference --      Peak Flow --      Pain Score 05/12/23 1846 0     Pain Loc --      Pain Education --      Exclude from Growth Chart --    No data found.  Updated Vital Signs BP 137/84 (BP Location: Left Arm)   Pulse (!) 107   Temp 98.7 F (37.1 C) (Oral)   Resp 18   LMP 04/21/2023   SpO2 98%   Visual Acuity Right Eye Distance:   Left Eye Distance:   Bilateral Distance:    Right Eye Near:   Left Eye Near:    Bilateral Near:     Physical Exam Vitals and nursing note reviewed.  Constitutional:      Appearance: Normal appearance.  HENT:     Head: Normocephalic and atraumatic.     Right Ear: External ear normal.     Left Ear: External ear normal.     Nose: Nose normal.     Mouth/Throat:     Mouth: Mucous membranes are moist.  Cardiovascular:     Rate and Rhythm: Normal rate.  Pulmonary:     Effort: Pulmonary effort is normal.     Breath sounds: Normal breath sounds.  Abdominal:     Tenderness: There is no right CVA tenderness or left CVA tenderness.  Musculoskeletal:        General: Normal range of motion.  Skin:    General: Skin is warm and dry.  Neurological:     General: No focal deficit present.     Mental Status: She is alert and oriented to person, place, and time.      UC Treatments / Results  Labs (all labs ordered are listed, but only abnormal results are displayed) Labs Reviewed  POCT URINALYSIS DIP (MANUAL ENTRY) - Abnormal; Notable for the following components:      Result Value   Clarity, UA cloudy (*)    Spec  Grav, UA >=1.030 (*)    Blood, UA trace-lysed (*)    Leukocytes, UA Small (1+) (*)    All other components within normal limits    EKG   Radiology No results found.  Procedures Procedures (including critical care time)  Medications Ordered in UC Medications - No data to display  Initial Impression / Assessment and Plan / UC Course  I have reviewed the triage vital signs and the nursing notes.  Pertinent labs & imaging results that were available during my care of the patient were reviewed by me and considered in my medical decision making (see chart for details).  Vitals and triage reviewed, patient is hemodynamically stable.  Negative for CVA tenderness.  Without fever, flank pain, nausea or vomiting, low concern for pyelo.  Urinalysis with trace red blood cells and small leukocytes.  With symptoms of urgency, frequency and dysuria suspect early UTI, will send for culture.  Treat with 3 days of Bactrim and Azo for symptomatic relief.  Plan of care, follow-up care and return precautions given, no questions at this time.     Final Clinical Impressions(s) / UC Diagnoses   Final diagnoses:  Cystitis     Discharge Instructions      Take the antibiotics twice daily with food to treat your urinary tract infection.  You can also take the Pyridium 3 times daily for any discomfort or burning.  This will change the color of your  secretions.  Ensure you are drinking at least 64 ounces of water.  We will contact you if we need to modify your antibiotic therapy based on your culture results.  Return to clinic for any new or urgent symptoms.     ED Prescriptions     Medication Sig Dispense Auth. Provider   sulfamethoxazole-trimethoprim (BACTRIM DS) 800-160 MG tablet Take 1 tablet by mouth 2 (two) times daily for 3 days. 6 tablet Rinaldo Ratel, [place] N, FNP   phenazopyridine (PYRIDIUM) 95 MG tablet Take 1 tablet (95 mg total) by mouth 3 (three) times daily as needed for pain. 10 tablet  Ali Mohl, [place] N, FNP      PDMP not reviewed this encounter.   Lavine Hargrove, [place] N, [place] 05/12/23 Ernestina [place]

## 2023-05-12 NOTE — ED Triage Notes (Signed)
Pt c/o burning/pain on urination with urgency and frequency x2 days.

## 2023-05-15 LAB — URINE CULTURE: Culture: >=100000 — AB

## 2023-05-27 ENCOUNTER — Ambulatory Visit: Payer: Medicaid Other | Admitting: Internal Medicine

## 2023-06-11 ENCOUNTER — Encounter: Payer: Self-pay | Admitting: Nurse Practitioner

## 2023-06-11 ENCOUNTER — Ambulatory Visit (INDEPENDENT_AMBULATORY_CARE_PROVIDER_SITE_OTHER): Payer: Medicaid Other | Admitting: Nurse Practitioner

## 2023-06-11 ENCOUNTER — Other Ambulatory Visit: Payer: Self-pay | Admitting: Nurse Practitioner

## 2023-06-11 VITALS — BP 136/80 | HR 84 | Temp 96.3°F | Wt 286.0 lb

## 2023-06-11 DIAGNOSIS — E1165 Type 2 diabetes mellitus with hyperglycemia: Secondary | ICD-10-CM

## 2023-06-11 DIAGNOSIS — I10 Essential (primary) hypertension: Secondary | ICD-10-CM | POA: Diagnosis not present

## 2023-06-11 LAB — POCT GLYCOSYLATED HEMOGLOBIN (HGB A1C): Hemoglobin A1C: 9.5 % — AB (ref 4.0–5.6)

## 2023-06-11 NOTE — Progress Notes (Signed)
Established Patient Office Visit  Subjective:  Patient ID: Beverly Johnson, female    DOB: June 11, 1976  Age: 47 y.o. MRN: [id number]  CC:  Chief Complaint  Patient presents with   Diabetes   Hypertension    HPI Beverly Johnson is a 47 y.o. female  has a past medical history of Back pain, DM2 (diabetes mellitus, type 2) (HCC), HIV infection (HCC), Human immunodeficiency virus (HIV) disease (HCC) (09/29/2006), Menorrhagia (11/05/2022), Microalbuminuria due to type 2 diabetes mellitus (HCC), Obesity (04/27/2012), Primary hypertension (03/02/2023), Uncontrolled type 2 diabetes mellitus with hyperglycemia (HCC) (03/02/2023), and Vitamin D deficiency (03/21/2017).   Patient presents for follow-up for her chronic medical conditions. Stated that she forgot about his appointment and she is a hurry to go back to work. She was willing to get only A1C checked today , refused other labs  Patient denies any complaints today Stated that she has been taking all medications as prescribed      Past Medical History:  Diagnosis Date   Back pain    DM2 (diabetes mellitus, type 2) (HCC)    HIV infection (HCC)    Human immunodeficiency virus (HIV) disease (HCC) 09/29/2006   Qualifier: Diagnosis of   By: Philipp Deputy MD, Kelly         Menorrhagia 11/05/2022   Microalbuminuria due to type 2 diabetes mellitus (HCC)    Obesity 04/27/2012   Current weight 279lb 04/27/12     Primary hypertension 03/02/2023   Uncontrolled type 2 diabetes mellitus with hyperglycemia (HCC) 03/02/2023   Vitamin D deficiency 03/21/2017    Past Surgical History:  Procedure Laterality Date   CESAREAN SECTION     x3   CHOLECYSTECTOMY  06/29/2011   Procedure: LAPAROSCOPIC CHOLECYSTECTOMY WITH INTRAOPERATIVE CHOLANGIOGRAM;  Surgeon: Robyne Askew, MD;  Location: [facility] OR;  Service: General;  Laterality: N/A;   TUBAL LIGATION      Family History  Problem Relation Age of Onset   Healthy Mother    Healthy Father    Breast cancer  Neg Hx     Social History   Socioeconomic History   Marital status: Married    Spouse name: Not on file   Number of children: Not on file   Years of education: 10   Highest education level: Not on file  Occupational History    Employer: UNEMPLOYED  Tobacco Use   Smoking status: Never   Smokeless tobacco: Never  Vaping Use   Vaping status: Never Used  Substance and Sexual Activity   Alcohol use: No   Drug use: Not Currently    Types: Marijuana    Comment: CBD   Sexual activity: Yes    Partners: Male    Birth control/protection: None  Other Topics Concern   Not on file  Social History Narrative   Not on file   Social Determinants of Health   Financial Resource Strain: Not on file  Food Insecurity: No Food Insecurity (02/24/2021)   Hunger Vital Sign    Worried About Running Out of Food in the Last Year: Never true    Ran Out of Food in the Last Year: Never true  Transportation Needs: No Transportation Needs (02/24/2021)   PRAPARE - Administrator, Civil Service (Medical): No    Lack of Transportation (Non-Medical): No  Physical Activity: Not on file  Stress: Not on file  Social Connections: Not on file  Intimate Partner Violence: Not on file    Outpatient Medications Prior to  Visit  Medication Sig Dispense Refill   Accu-Chek Softclix Lancets lancets 1 each 3 (three) times daily.     GENVOYA 150-150-200-10 MG TABS tablet TAKE ONE TABLET BY MOUTH DAILY WITH BREAKFAST 30 tablet 0   GLOBAL EASE INJECT PEN NEEDLES 32G X 4 MM MISC      glucose blood (ACCU-CHEK GUIDE) test strip USE AS INSTRUCTED 100 each 10   glucose blood test strip Check your sugar in the morning before you eat breakfast, and one hour after a meal. 100 each 2   ibuprofen (ADVIL) 800 MG tablet Take 1 tablet (800 mg total) by mouth every 8 (eight) hours as needed. 20 tablet 0   insulin glargine-yfgn (SEMGLEE) 100 UNIT/ML Pen INJECT 48 UNITS INTO THE SKIN TWICE A DAY 15 mL 1   lisinopril  (ZESTRIL) 5 MG tablet Take 1 tablet (5 mg total) by mouth daily. 60 tablet 1   phenazopyridine (PYRIDIUM) 95 MG tablet Take 1 tablet (95 mg total) by mouth 3 (three) times daily as needed for pain. 10 tablet 0   RESTASIS 0.05 % ophthalmic emulsion      cyclobenzaprine (FLEXERIL) 10 MG tablet Take 1 tablet by mouth 3 times daily as needed for muscle spasm. Warning: May cause drowsiness. (Patient not taking: Reported on 03/02/2023) 21 tablet 0   tranexamic acid (LYSTEDA) 650 MG TABS tablet TAKE TWO TABLETS BY MOUTH THREE TIMES A DAY . TAKE DURING MENSES FOR A MAXIMUM OF FIVE DAYS (Patient not taking: Reported on 06/11/2023) 30 tablet 6   Continuous Glucose Sensor (DEXCOM G7 SENSOR) MISC Use as directed. (Patient not taking: Reported on 06/11/2023) 4 each 3   glucose monitoring kit (FREESTYLE) monitoring kit 1 each by Does not apply route daily. Check glucose once in the morning before breakfast and 1 hour after a meal (Patient not taking: Reported on 11/24/2022) 1 each 0   No facility-administered medications prior to visit.    No Known Allergies  ROS Review of Systems  Constitutional:  Negative for appetite change, chills, fatigue and fever.  HENT:  Negative for congestion, postnasal drip, rhinorrhea and sneezing.   Respiratory:  Negative for cough, shortness of breath and wheezing.   Cardiovascular:  Negative for chest pain, palpitations and leg swelling.  Gastrointestinal:  Negative for abdominal pain, constipation, nausea and vomiting.  Genitourinary:  Negative for difficulty urinating, dysuria, flank pain and frequency.  Musculoskeletal:  Negative for arthralgias, back pain, joint swelling and myalgias.  Skin:  Negative for color change, pallor, rash and wound.  Neurological:  Negative for dizziness, facial asymmetry, weakness, numbness and headaches.  Psychiatric/Behavioral:  Negative for behavioral problems, confusion, self-injury and suicidal ideas.       Objective:    Physical  Exam Vitals and nursing note reviewed.  Constitutional:      General: She is not in acute distress.    Appearance: Normal appearance. She is obese. She is not ill-appearing, toxic-appearing or diaphoretic.  HENT:     Mouth/Throat:     Mouth: Mucous membranes are moist.     Pharynx: Oropharynx is clear. No oropharyngeal exudate or posterior oropharyngeal erythema.  Eyes:     General: No scleral icterus.       Right eye: No discharge.        Left eye: No discharge.     Extraocular Movements: Extraocular movements intact.     Conjunctiva/sclera: Conjunctivae normal.  Cardiovascular:     Rate and Rhythm: Normal rate and regular rhythm.  Pulses: Normal pulses.     Heart sounds: Normal heart sounds. No murmur heard.    No friction rub. No gallop.  Pulmonary:     Effort: Pulmonary effort is normal. No respiratory distress.     Breath sounds: Normal breath sounds. No stridor. No wheezing, rhonchi or rales.  Chest:     Chest wall: No tenderness.  Abdominal:     General: There is no distension.     Palpations: Abdomen is soft.     Tenderness: There is no abdominal tenderness. There is no right CVA tenderness, left CVA tenderness or guarding.  Musculoskeletal:        General: No swelling, tenderness, deformity or signs of injury.     Right lower leg: No edema.     Left lower leg: No edema.  Skin:    General: Skin is warm and dry.     Capillary Refill: Capillary refill takes less than 2 seconds.     Coloration: Skin is not jaundiced or pale.     Findings: No bruising, erythema or lesion.  Neurological:     Mental Status: She is alert and oriented to person, place, and time.     Motor: No weakness.     Coordination: Coordination normal.     Gait: Gait normal.  Psychiatric:        Mood and Affect: Mood normal.        Behavior: Behavior normal.        Thought Content: Thought content normal.        Judgment: Judgment normal.     BP 136/80   Pulse 84   Temp (!) 96.3 F (35.7  C)   Wt 286 lb (129.7 kg)   LMP 04/21/2023   SpO2 100%   BMI 44.79 kg/m  Wt Readings from Last 3 Encounters:  06/11/23 286 lb (129.7 kg)  04/02/23 287 lb 3.2 oz (130.3 kg)  04/01/23 284 lb 9.8 oz (129.1 kg)    Lab Results  Component Value Date   TSH 1.105 06/17/2019   Lab Results  Component Value Date   WBC 3.8 (L) 04/01/2023   HGB 10.4 (L) 04/01/2023   HCT 34.1 (L) 04/01/2023   MCV 78.2 (L) 04/01/2023   PLT 161 04/01/2023   Lab Results  Component Value Date   NA 133 (L) 04/01/2023   K 3.5 04/01/2023   CO2 23 04/01/2023   GLUCOSE 211 (H) 04/01/2023   BUN 8 04/01/2023   CREATININE 0.71 04/01/2023   BILITOT 0.4 04/01/2023   ALKPHOS 111 04/01/2023   AST 21 04/01/2023   ALT 15 04/01/2023   PROT 7.7 04/01/2023   ALBUMIN 3.1 (L) 04/01/2023   CALCIUM 8.3 (L) 04/01/2023   ANIONGAP 4 (L) 04/01/2023   Lab Results  Component Value Date   CHOL 148 11/05/2020   Lab Results  Component Value Date   HDL 56 11/05/2020   Lab Results  Component Value Date   LDLCALC 73 11/05/2020   Lab Results  Component Value Date   TRIG 108 11/05/2020   Lab Results  Component Value Date   CHOLHDL 2.6 11/05/2020   Lab Results  Component Value Date   HGBA1C 9.5 (A) 06/11/2023      Assessment & Plan:   Problem List Items Addressed This Visit       Cardiovascular and Mediastinum   Primary hypertension    BP Readings from Last 3 Encounters:  06/11/23 136/80  05/12/23 137/84  04/02/23 136/79  Fairly controlled.  Continue lisinopril 5 mg daily Continue current medications. No changes in management. Discussed DASH diet and dietary sodium restrictions Continue to increase dietary efforts and exercise.  Encouraged to monitor blood pressure at home, BP goal is less than 130/80 Follow-up in 3 months          Endocrine   Uncontrolled type 2 diabetes mellitus with hyperglycemia (HCC) - Primary    Lab Results  Component Value Date   HGBA1C 9.5 (A) 06/11/2023  Chronic  uncontrolled medical condition Denies polyphagia polyuria polydipsia Currently on Semglee 48 units twice daily Patient was not willing to start new medication Will refer the patient to endocrinology for further recommendation since patient is not willing to start new medication and she is on a high dose insulin. Patient counseled on low-carb modified diet,Works at [employer], states that it is hard to maintain a healthy diet due to this, trying to look for another job. Currently not on a statin ,wanted to check her lipid panel at the office today but the patient declined. Follow-up in 3 months       Relevant Orders   POCT glycosylated hemoglobin (Hb A1C) (Completed)   LDL Cholesterol, Direct    No orders of the defined types were placed in this encounter.   Follow-up: Return in about 3 months (around 09/11/2023) for DM.    Donell Beers, FNP

## 2023-06-11 NOTE — Assessment & Plan Note (Signed)
Lab Results  Component Value Date   HGBA1C 9.5 (A) 06/11/2023  Chronic uncontrolled medical condition Denies polyphagia polyuria polydipsia Currently on Semglee 48 units twice daily Patient was not willing to start new medication Will refer the patient to endocrinology for further recommendation since patient is not willing to start new medication and she is on a high dose insulin. Patient counseled on low-carb modified diet,Works at [employer], states that it is hard to maintain a healthy diet due to this, trying to look for another job. Currently not on a statin ,wanted to check her lipid panel at the office today but the patient declined. Follow-up in 3 months

## 2023-06-11 NOTE — Patient Instructions (Addendum)
Your diabetes remains uncontrolled.   You need additional medications to help get your diabetes under control.  Since you are not willing to try new medications, I will continue your current dose of Lantus and refer you to endocrinology for further recommendations.  Please stop by the office so we can check your cholesterol levels.  It is recommended that your bad cholesterol level be lower than 70 to reduce the risk of stroke, heart attack.    Around 3 times per week, check your blood pressure 2 times per day. once in the morning and once in the evening. The readings should be at least one minute apart. Write down these values and bring them to your next nurse visit/appointment.  When you check your BP, make sure you have been doing something calm/relaxing 5 minutes prior to checking. Both feet should be flat on the floor and you should be sitting. Use your left arm and make sure it is in a relaxed position (on a table), and that the cuff is at the approximate level/height of your heart.   Your blood pressure goal is less than 130/80.  Please continue lisinopril 5 mg daily.  Avoid salty foods engage in regular moderate exercise at least 150 minutes weekly as tolerated   It is important that you exercise regularly at least 30 minutes 5 times a week as tolerated  Think about what you will eat, plan ahead. Choose " clean, green, fresh or frozen" over canned, processed or packaged foods which are more sugary, salty and fatty. 70 to 75% of food eaten should be vegetables and fruit. Three meals at set times with snacks allowed between meals, but they must be fruit or vegetables. Aim to eat over a 12 hour period , example 7 am to 7 pm, and STOP after  your last meal of the day. Drink water,generally about 64 ounces per day, no other drink is as healthy. Fruit juice is best enjoyed in a healthy way, by EATING the fruit.  Thanks for choosing [facility] we consider it a privelige to serve you.

## 2023-06-11 NOTE — Assessment & Plan Note (Signed)
BP Readings from Last 3 Encounters:  06/11/23 136/80  05/12/23 137/84  04/02/23 136/79  Fairly controlled.  Continue lisinopril 5 mg daily Continue current medications. No changes in management. Discussed DASH diet and dietary sodium restrictions Continue to increase dietary efforts and exercise.  Encouraged to monitor blood pressure at home, BP goal is less than 130/80 Follow-up in 3 months

## 2023-06-18 ENCOUNTER — Telehealth: Payer: Self-pay

## 2023-06-18 ENCOUNTER — Ambulatory Visit (INDEPENDENT_AMBULATORY_CARE_PROVIDER_SITE_OTHER): Payer: Medicaid Other | Admitting: Internal Medicine

## 2023-06-18 ENCOUNTER — Other Ambulatory Visit: Payer: Self-pay | Admitting: Nurse Practitioner

## 2023-06-18 ENCOUNTER — Other Ambulatory Visit: Payer: Self-pay

## 2023-06-18 ENCOUNTER — Encounter: Payer: Self-pay | Admitting: Internal Medicine

## 2023-06-18 VITALS — BP 128/83 | HR 101 | Temp 98.8°F | Wt 287.0 lb

## 2023-06-18 DIAGNOSIS — B20 Human immunodeficiency virus [HIV] disease: Secondary | ICD-10-CM | POA: Diagnosis not present

## 2023-06-18 DIAGNOSIS — Z113 Encounter for screening for infections with a predominantly sexual mode of transmission: Secondary | ICD-10-CM

## 2023-06-18 DIAGNOSIS — Z23 Encounter for immunization: Secondary | ICD-10-CM

## 2023-06-18 MED ORDER — OZEMPIC (0.25 OR 0.5 MG/DOSE) 2 MG/3ML ~~LOC~~ SOPN: 0.2500 mg | PEN_INJECTOR | SUBCUTANEOUS | 0 refills | Status: DC

## 2023-06-18 NOTE — Assessment & Plan Note (Signed)
She has declined vaccines.

## 2023-06-18 NOTE — Assessment & Plan Note (Signed)
Will screen 

## 2023-06-18 NOTE — Assessment & Plan Note (Signed)
She is doing well overall with some missed doses.  Emphasized need to take daily.  Labs today and otherwise rtc in 6 months.

## 2023-06-18 NOTE — Telephone Encounter (Signed)
Patient called today to let us know that she was in fact interested in starting the ozempic and asking to have it sent in. Please advise.

## 2023-06-18 NOTE — Progress Notes (Signed)
   Subjective:    Patient ID: Beverly Johnson, female    DOB: Jul 24, 1976, 47 y.o.   MRN: [id number]  HPI Maelle is here for follow up of HIV She continues on Genvoya and some missed doses, two recently.  No new issues.  Continues to work with her PCP regarding her diabetes and doing better overall. Contemplating Ozempic.  Some tiredness recently.    Review of Systems  Constitutional:  Positive for fatigue. Negative for appetite change.  Gastrointestinal:  Negative for diarrhea and nausea.  Skin:  Negative for rash.       Objective:   Physical Exam Eyes:     General: No scleral icterus. Pulmonary:     Effort: Pulmonary effort is normal.  Neurological:     Mental Status: She is alert.   SH: no tobacco        Assessment & Plan:

## 2023-06-21 ENCOUNTER — Telehealth: Payer: Self-pay

## 2023-06-21 LAB — T-HELPER CELLS (CD4) COUNT (NOT AT ARMC)
Absolute CD4: 512 {cells}/uL (ref 490–1740)
CD4 T Helper %: 35 % (ref 30–61)
Total lymphocyte count: 1457 {cells}/uL (ref 850–3900)

## 2023-06-21 LAB — HIV-1 RNA QUANT-NO REFLEX-BLD
HIV 1 RNA Quant: 24 {copies}/mL — ABNORMAL HIGH
HIV-1 RNA Quant, Log: 1.39 {Log_copies}/mL — ABNORMAL HIGH

## 2023-06-21 LAB — RPR: RPR Ser Ql: NONREACTIVE

## 2023-06-21 LAB — C. TRACHOMATIS/N. GONORRHOEAE RNA
C. trachomatis RNA, TMA: NOT DETECTED
N. gonorrhoeae RNA, TMA: NOT DETECTED

## 2023-06-21 NOTE — Telephone Encounter (Signed)
-----   Message from Biron sent at 06/18/2023  5:03 PM EDT ----- Regarding: FW: ozempic Thanks Tresa Endo.  Kim please schedule an appointment in 4 weeks for follow-up ----- Message ----- From: Weldon Picking, CPhT Sent: 06/18/2023   1:29 PM EDT To: Donell Beers, FNP Subject: RE: ozempic                                    This has been approved by patient's [employer] insurance today. ----- Message ----- From: Donell Beers, FNP Sent: 06/18/2023  12:37 PM EDT To: Weldon Picking, CPhT Subject: ozempic                                        Hello Tresa Endo.  Kindly initiate PA for Ozempic.  Thank you

## 2023-06-21 NOTE — Telephone Encounter (Signed)
Called and sent a my chart message to pt . Kh

## 2023-06-23 ENCOUNTER — Other Ambulatory Visit: Payer: Self-pay

## 2023-06-23 ENCOUNTER — Encounter (HOSPITAL_COMMUNITY): Payer: Self-pay

## 2023-06-23 ENCOUNTER — Emergency Department (HOSPITAL_COMMUNITY)
Admission: EM | Admit: 2023-06-23 | Discharge: 2023-06-23 | Disposition: A | Discharge status: Home / Self Care | Payer: Medicaid Other | Attending: Emergency Medicine | Admitting: Emergency Medicine

## 2023-06-23 DIAGNOSIS — J028 Acute pharyngitis due to other specified organisms: Secondary | ICD-10-CM | POA: Insufficient documentation

## 2023-06-23 DIAGNOSIS — Z20822 Contact with and (suspected) exposure to covid-19: Secondary | ICD-10-CM | POA: Diagnosis not present

## 2023-06-23 DIAGNOSIS — B9789 Other viral agents as the cause of diseases classified elsewhere: Secondary | ICD-10-CM | POA: Insufficient documentation

## 2023-06-23 DIAGNOSIS — J029 Acute pharyngitis, unspecified: Secondary | ICD-10-CM | POA: Diagnosis not present

## 2023-06-23 LAB — RESP PANEL BY RT-PCR (RSV, FLU A&B, COVID)  RVPGX2
Influenza A by PCR: NEGATIVE
Influenza B by PCR: NEGATIVE
Resp Syncytial Virus by PCR: NEGATIVE
SARS Coronavirus 2 by RT PCR: NEGATIVE

## 2023-06-23 LAB — GROUP A STREP BY PCR: Group A Strep by PCR: NOT DETECTED

## 2023-06-23 MED ORDER — KETOROLAC TROMETHAMINE 15 MG/ML IJ SOLN
15.0000 mg | Freq: Once | INTRAMUSCULAR | Status: AC
  Administered 2023-06-23: 15 mg via INTRAMUSCULAR
  Filled 2023-06-23: qty 1

## 2023-06-23 NOTE — ED Provider Notes (Signed)
[place] EMERGENCY DEPARTMENT AT [facility] Provider Note   CSN: [id number] Arrival date & time: 06/23/23  5366     History  Chief Complaint  Patient presents with   Cough   Sore Throat    Beverly Johnson is a 47 y.o. female.  47 year old female presents today for concern of sore throat for the past couple days.  Endorses decreased p.o. intake.  No fever or difficulty swallowing.  Does endorse pain with swallowing.  No fever.  No other complaints.  Does have hoarse voice.  The history is provided by the patient. No language interpreter was used.       Home Medications Prior to Admission medications   Medication Sig Start Date End Date Taking? Authorizing Provider  Accu-Chek Softclix Lancets lancets 1 each 3 (three) times daily. 02/06/21   [provider]  cyclobenzaprine (FLEXERIL) 10 MG tablet Take 1 tablet by mouth 3 times daily as needed for muscle spasm. Warning: May cause drowsiness. Patient not taking: Reported on 03/02/2023 11/24/22   Donell Beers, FNP  GENVOYA 150-150-200-10 MG TABS tablet TAKE ONE TABLET BY MOUTH DAILY WITH BREAKFAST 05/07/23   Comer, Belia Heman, MD  GLOBAL EASE INJECT PEN NEEDLES 32G X 4 MM MISC  11/15/21   [provider]  glucose blood (ACCU-CHEK GUIDE) test strip USE AS INSTRUCTED 03/08/23   Edwin Dada R, FNP  glucose blood test strip Check your sugar in the morning before you eat breakfast, and one hour after a meal. 05/26/19   Domenick Gong, MD  ibuprofen (ADVIL) 800 MG tablet Take 1 tablet (800 mg total) by mouth every 8 (eight) hours as needed. 11/24/22   [name], Baird Kay, FNP  insulin glargine-yfgn (SEMGLEE) 100 UNIT/ML Pen INJECT 48 UNITS INTO THE SKIN TWICE A DAY 05/07/23   [name], [name], FNP  lisinopril (ZESTRIL) 5 MG tablet Take 1 tablet (5 mg total) by mouth daily. 04/02/23   Donell Beers, FNP  phenazopyridine (PYRIDIUM) 95 MG tablet Take 1 tablet (95 mg total) by mouth 3 (three) times  daily as needed for pain. 05/12/23   Garrison, [place] N, FNP  RESTASIS 0.05 % ophthalmic emulsion  11/06/22   [provider]  Semaglutide,0.25 or 0.5MG /DOS, (OZEMPIC, 0.25 OR 0.5 MG/DOSE,) 2 MG/3ML SOPN Inject 0.25 mg into the skin once a week. 06/18/23   [name], Baird Kay, FNP  tranexamic acid (LYSTEDA) 650 MG TABS tablet TAKE TWO TABLETS BY MOUTH THREE TIMES A DAY . TAKE DURING MENSES FOR A MAXIMUM OF FIVE DAYS 01/13/23   Warden Fillers, MD  cetirizine (ZYRTEC ALLERGY) 10 MG tablet Take 1 tablet (10 mg total) by mouth daily. Patient not taking: Reported on 05/27/2020 11/26/19 11/08/20  Henderly, Britni A, PA-C      Allergies    Patient has no known allergies.    Review of Systems   Review of Systems  Constitutional:  Negative for chills and fever.  HENT:  Positive for sore throat and voice change. Negative for trouble swallowing.   All other systems reviewed and are negative.   Physical Exam Updated Vital Signs BP (!) 154/98 (BP Location: Left Arm)   Pulse (!) 102   Temp 98.7 F (37.1 C) (Oral)   Resp 18   Wt 130.2 kg   LMP 05/18/2023   SpO2 98%   BMI 44.95 kg/m  Physical Exam Vitals and nursing note reviewed.  Constitutional:      General: She is not in acute distress.  Appearance: Normal appearance. She is not ill-appearing.  HENT:     Head: Normocephalic and atraumatic.     Nose: Nose normal.     Mouth/Throat:     Mouth: Mucous membranes are moist.     Pharynx: No oropharyngeal exudate or posterior oropharyngeal erythema.     Comments: No evidence of peritonsillar abscess, retropharyngeal abscess, Ludwig's angina.  No trismus. Eyes:     Conjunctiva/sclera: Conjunctivae normal.  Pulmonary:     Effort: Pulmonary effort is normal. No respiratory distress.  Musculoskeletal:        General: No deformity.  Skin:    Findings: No rash.  Neurological:     Mental Status: She is alert.     ED Results / Procedures / Treatments   Labs (all labs ordered are  listed, but only abnormal results are displayed) Labs Reviewed  GROUP A STREP BY PCR  RESP PANEL BY RT-PCR (RSV, FLU A&B, COVID)  [id number]    EKG None  Radiology No results found.  Procedures Procedures    Medications Ordered in ED Medications  ketorolac (TORADOL) 15 MG/ML injection 15 mg (15 mg Intramuscular Given 06/23/23 1041)    ED Course/ Medical Decision Making/ A&P                                 Medical Decision Making Risk Prescription drug management.   47 year old female presents today for concern of hoarse voice, sore throat.  Respiratory panel negative.  Strep negative.  Likely viral URI.  Offered steroid shot for hoarseness but she defers.  Symptomatic management discussed.  Return precaution discussed.  Discussed follow-up with PCP.  Patient is in agreement with plan.   Final Clinical Impression(s) / ED Diagnoses Final diagnoses:  Viral pharyngitis    Rx / DC Orders ED Discharge Orders     None         Marita Kansas, PA-C 06/23/23 1323    Elayne Snare K, DO 06/23/23 1440

## 2023-06-23 NOTE — ED Triage Notes (Signed)
Pt c/o sore throat and cough x2 days.  Pain score 9/10.  Pt reports taking OTC medication w/o relief.

## 2023-06-23 NOTE — Discharge Instructions (Signed)
Take Tylenol and ibuprofen as you need to for pain.  Use lozenges, and perform warm salt water gargles to help with your sore throat.

## 2023-07-02 IMAGING — DX DG KNEE COMPLETE 4+V*R*
4 series · 4 of 4 positions shown · non-contrast
Comparison: April 21, 2018

CLINICAL DATA: Acute on chronic right knee pain.

EXAM:
RIGHT KNEE - COMPLETE 4+ VIEW

[knee ap]
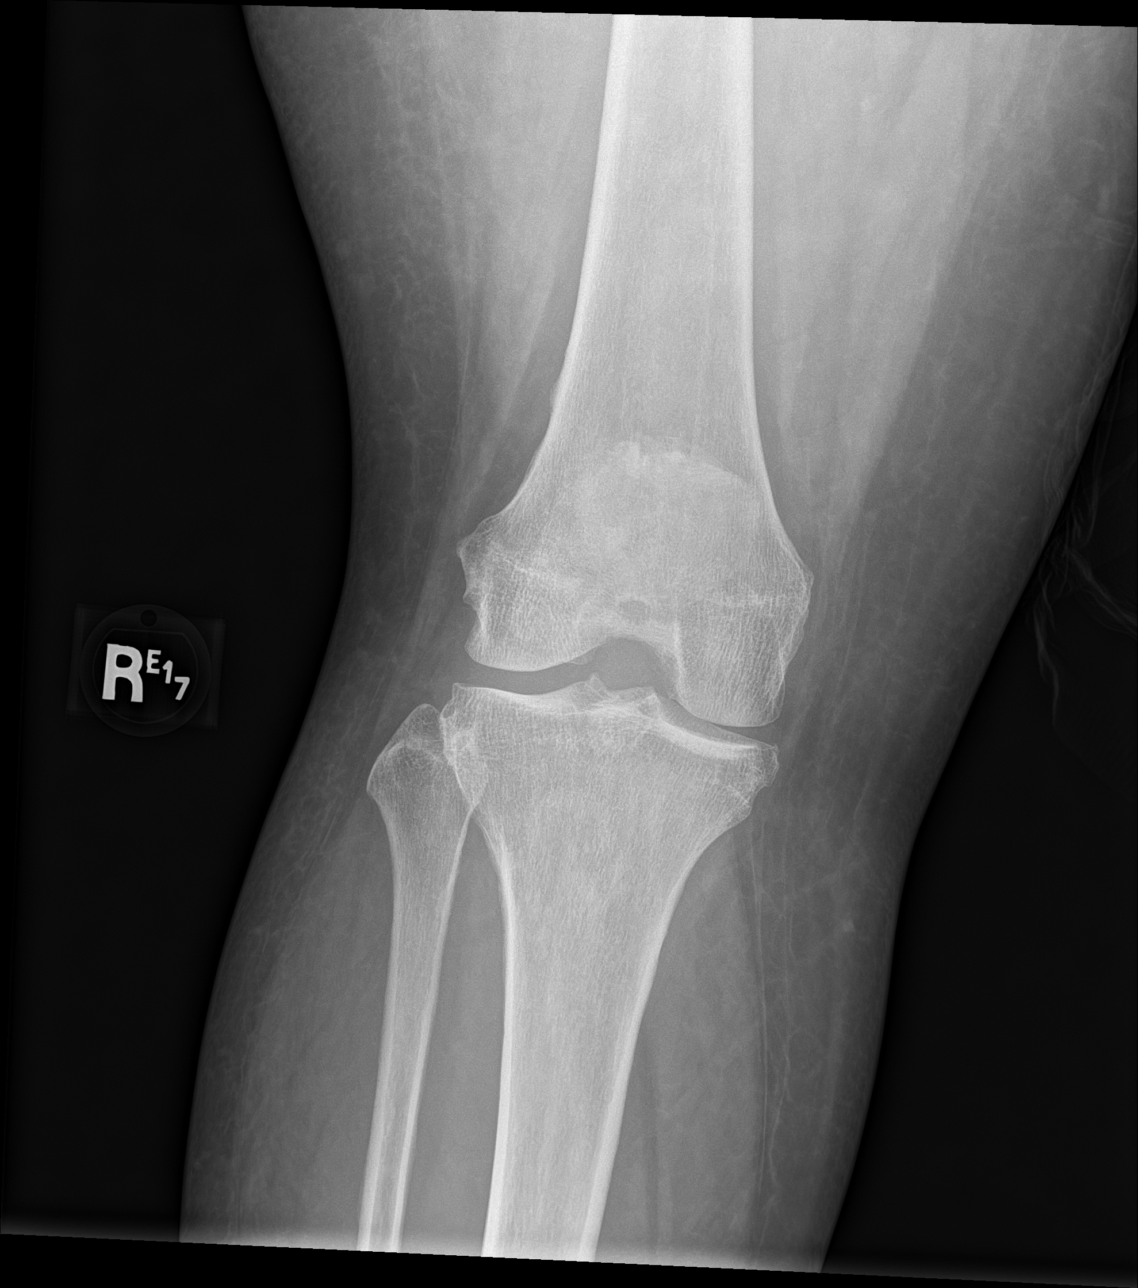

[knee lat]
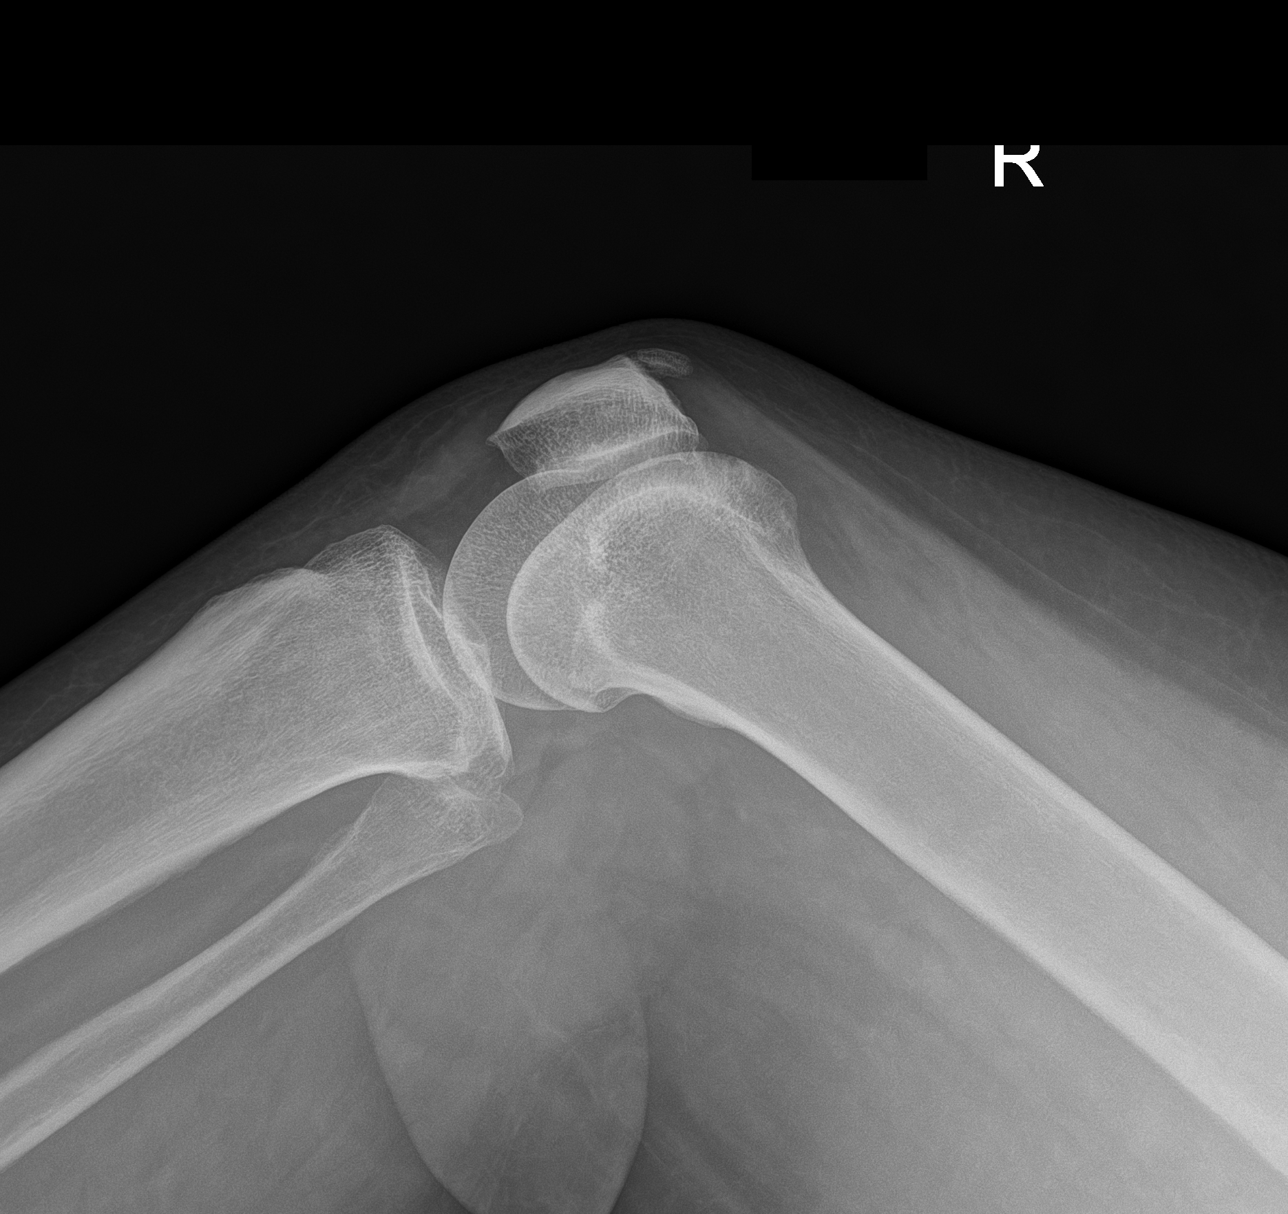

[knee obl (1 of 2)]
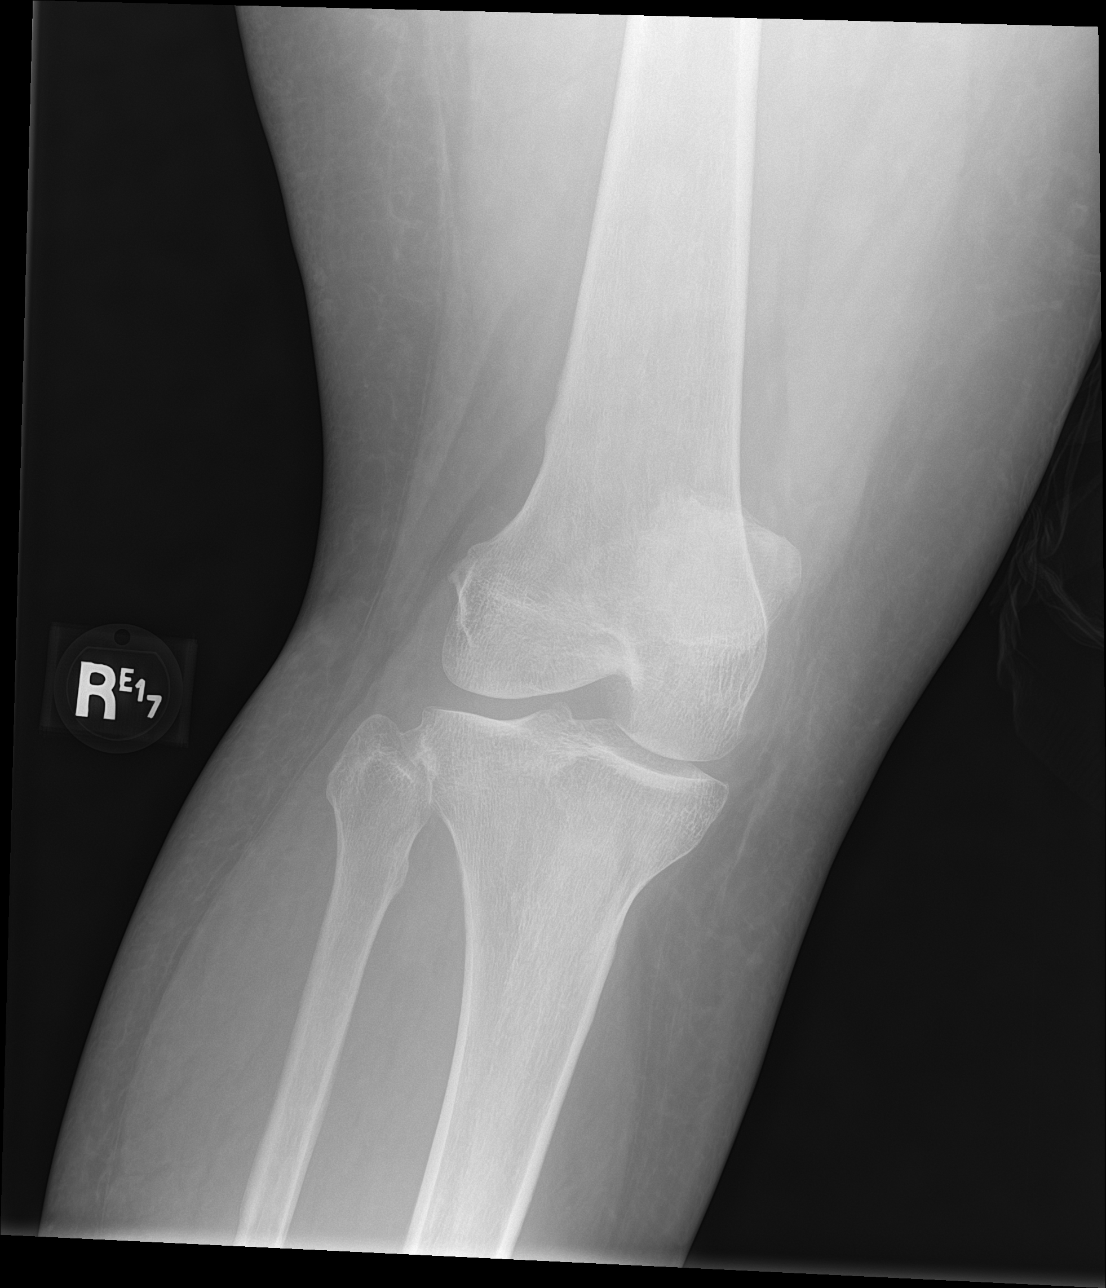

[knee obl (2 of 2)]
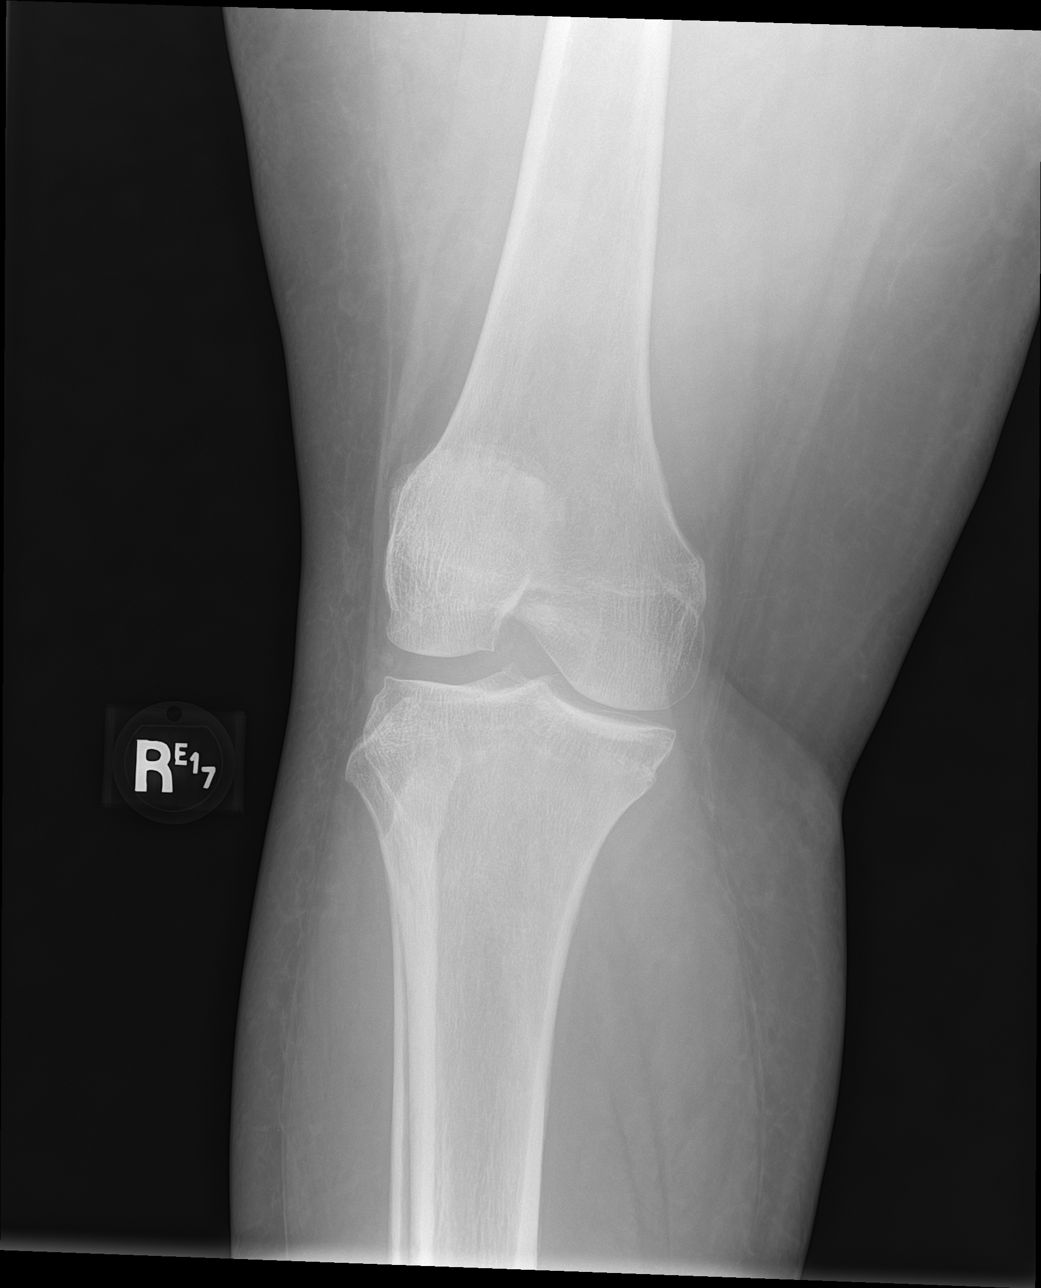

[4 of 4 positions shown; findings below may reference images not displayed]

FINDINGS: No evidence of fracture, dislocation, or joint effusion. Similar
mild medial tibiofemoral and patellofemoral joint space degenerative
change. Patellar enthesophyte. Soft tissues are unremarkable.
IMPRESSION: No fracture or dislocation of the right knee.

Similar mild degenerative change.

## 2023-07-26 ENCOUNTER — Other Ambulatory Visit: Payer: Self-pay | Admitting: Nurse Practitioner

## 2023-07-27 ENCOUNTER — Other Ambulatory Visit (HOSPITAL_COMMUNITY): Payer: Self-pay

## 2023-07-27 ENCOUNTER — Other Ambulatory Visit (HOSPITAL_COMMUNITY): Payer: Self-pay | Admitting: Nurse Practitioner

## 2023-07-27 MED ORDER — OZEMPIC (0.25 OR 0.5 MG/DOSE) 2 MG/3ML ~~LOC~~ SOPN
0.2500 mg | PEN_INJECTOR | SUBCUTANEOUS | 0 refills | Status: DC
  Filled 2023-07-27: qty 3, 28d supply, fill #0

## 2023-08-24 ENCOUNTER — Other Ambulatory Visit: Payer: Self-pay | Admitting: Internal Medicine

## 2023-08-24 DIAGNOSIS — B20 Human immunodeficiency virus [HIV] disease: Secondary | ICD-10-CM

## 2023-09-06 ENCOUNTER — Other Ambulatory Visit: Payer: Self-pay | Admitting: Nurse Practitioner

## 2023-09-06 DIAGNOSIS — E1165 Type 2 diabetes mellitus with hyperglycemia: Secondary | ICD-10-CM

## 2023-09-10 ENCOUNTER — Ambulatory Visit: Payer: Self-pay | Admitting: Nurse Practitioner

## 2023-09-13 ENCOUNTER — Other Ambulatory Visit: Payer: Self-pay | Admitting: Nurse Practitioner

## 2023-09-13 MED ORDER — OZEMPIC (0.25 OR 0.5 MG/DOSE) 2 MG/3ML ~~LOC~~ SOPN: 0.2500 mg | PEN_INJECTOR | SUBCUTANEOUS | 0 refills | Status: DC

## 2023-09-13 NOTE — Telephone Encounter (Signed)
Copied from CRM [id number]. Topic: Clinical - Medication Refill >> Sep 13, 2023 12:14 PM Desma Mcgregor wrote: Most Recent Primary Care Visit:  Provider: Donell Beers  Department: [facility]  Visit Type: OFFICE VISIT  Date: 06/11/2023  Medication: Semaglutide,0.25 or 0.5MG /DOS, (OZEMPIC, 0.25 OR 0.5 MG/DOSE,) 2 MG/3ML SOPN   Has the patient contacted their pharmacy? Yes, no refills on file.  Is this the correct pharmacy for this prescription? Yes If no, delete pharmacy and type the correct one.  This is the patient's preferred pharmacy:  [facility] - [place], [place] - [address] [facility] [address] [place] [address] Phone: [phone] Fax: [phone]  Has the prescription been filled recently? Yes  Is the patient out of the medication? Yes  Has the patient been seen for an appointment in the last year OR does the patient have an upcoming appointment? Yes  Can we respond through MyChart? Yes  Agent: Please be advised that Rx refills may take up to 3 business days. We ask that you follow-up with your pharmacy.

## 2023-09-14 ENCOUNTER — Ambulatory Visit (HOSPITAL_COMMUNITY)
Admission: EM | Admit: 2023-09-14 | Discharge: 2023-09-14 | Disposition: A | Discharge status: Home / Self Care | Payer: Medicaid Other | Attending: Physician Assistant | Admitting: Physician Assistant

## 2023-09-14 ENCOUNTER — Encounter (HOSPITAL_COMMUNITY): Payer: Self-pay

## 2023-09-14 DIAGNOSIS — R102 Pelvic and perineal pain: Secondary | ICD-10-CM | POA: Insufficient documentation

## 2023-09-14 DIAGNOSIS — R350 Frequency of micturition: Secondary | ICD-10-CM | POA: Diagnosis not present

## 2023-09-14 LAB — POCT URINALYSIS DIP (MANUAL ENTRY)
Bilirubin, UA: NEGATIVE
Blood, UA: NEGATIVE
Glucose, UA: >=1000 mg/dL — AB
Ketones, POC UA: NEGATIVE mg/dL
Leukocytes, UA: NEGATIVE
Nitrite, UA: NEGATIVE
Protein Ur, POC: NEGATIVE mg/dL
Spec Grav, UA: 1.025 (ref 1.010–1.025)
Urobilinogen, UA: 0.2 U/dL
pH, UA: 5.5 (ref 5.0–8.0)

## 2023-09-14 NOTE — ED Provider Notes (Signed)
[facility]    CSN: [id number] Arrival date & time: 09/14/23  4098      History   Chief Complaint Chief Complaint  Patient presents with   Dysuria    HPI Beverly Johnson is a 48 y.o. female.   HPI   She reports vulvovaginal irritation  She denies notable odor or changes to vaginal discharge  She reports some light pink blood with wiping  She reports this has been ongoing for about 2 weeks  She declines STD testing today but is amenable to getting cervicovaginal swab  Past Medical History:  Diagnosis Date   Back pain    DM2 (diabetes mellitus, type 2) (HCC)    HIV infection (HCC)    Human immunodeficiency virus (HIV) disease (HCC) 09/29/2006   Qualifier: Diagnosis of   By: Philipp Deputy MD, Kelly         Menorrhagia 11/05/2022   Microalbuminuria due to type 2 diabetes mellitus (HCC)    Obesity 04/27/2012   Current weight 279lb 04/27/12     Primary hypertension 03/02/2023   Uncontrolled type 2 diabetes mellitus with hyperglycemia (HCC) 03/02/2023   Vitamin D deficiency 03/21/2017    Patient Active Problem List   Diagnosis Date Noted   Uncontrolled type 2 diabetes mellitus with hyperglycemia (HCC) 03/02/2023   Primary hypertension 03/02/2023   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   Bilateral leg edema 11/24/2022   Lymphadenopathy, abdominal 11/24/2022   Acute low back pain without sciatica 11/11/2022   Menorrhagia 11/05/2022   Anemia 03/09/2022   Leukopenia 11/25/2021   Need for prophylactic vaccination and inoculation against influenza 05/28/2021   Adenomyosis 02/24/2021   Vulvovaginal candidiasis 01/16/2021   Dysmenorrhea 01/16/2021   Callus of foot 11/26/2020   Heavy menses 11/26/2020   History of 2019 novel coronavirus disease (COVID-19) 06/16/2019   Abnormal ECG 06/16/2019   Medication monitoring encounter 11/12/2017   Breast nodule 09/02/2017   Screening examination for venereal disease 03/30/2017   Encounter for  long-term (current) use of high-risk medication 03/30/2017   Microalbuminuria due to type 2 diabetes mellitus (HCC) 03/30/2017   Knee pain, chronic 03/30/2017   Chest wall pain 03/21/2017   Vitamin D deficiency 03/21/2017   Healthcare maintenance 03/02/2014   Obesity 04/27/2012   Type 2 diabetes mellitus without complications (HCC) 12/08/2010   Human immunodeficiency virus (HIV) disease (HCC) 09/29/2006    Past Surgical History:  Procedure Laterality Date   CESAREAN SECTION     x3   CHOLECYSTECTOMY  06/29/2011   Procedure: LAPAROSCOPIC CHOLECYSTECTOMY WITH INTRAOPERATIVE CHOLANGIOGRAM;  Surgeon: Robyne Askew, MD;  Location: [facility] OR;  Service: General;  Laterality: N/A;   TUBAL LIGATION      OB History     Gravida  3   Para  3   Term  3   Preterm      AB      Living  3      SAB      IAB      Ectopic      Multiple      Live Births  3        Obstetric Comments  c-section x 3. Last one with BTL done in the mid 2000s          Home Medications    Prior to Admission medications   Medication Sig Start Date End Date Taking? Authorizing Provider  Accu-Chek Softclix Lancets lancets 1 each 3 (three) times daily. 02/06/21  [provider]  cyclobenzaprine (FLEXERIL) 10 MG tablet Take 1 tablet by mouth 3 times daily as needed for muscle spasm. Warning: May cause drowsiness. Patient not taking: Reported on 03/02/2023 11/24/22   Donell Beers, FNP  GENVOYA 150-150-200-10 MG TABS tablet TAKE ONE TABLET BY MOUTH DAILY WITH BREAKFAST 08/24/23   Comer, Belia Heman, MD  GLOBAL EASE INJECT PEN NEEDLES 32G X 4 MM MISC  11/15/21   [provider]  glucose blood (ACCU-CHEK GUIDE) test strip USE AS INSTRUCTED 03/08/23   Edwin Dada R, FNP  glucose blood test strip Check your sugar in the morning before you eat breakfast, and one hour after a meal. 05/26/19   Domenick Gong, MD  ibuprofen (ADVIL) 800 MG tablet Take 1 tablet (800 mg total) by mouth every 8  (eight) hours as needed. 11/24/22   [name], Baird Kay, FNP  insulin glargine-yfgn (SEMGLEE) 100 UNIT/ML Pen INJECT 48 UNITS INTO THE SKIN TWICE A DAY 09/06/23   [name], [name], FNP  lisinopril (ZESTRIL) 5 MG tablet Take 1 tablet (5 mg total) by mouth daily. 04/02/23   Donell Beers, FNP  phenazopyridine (PYRIDIUM) 95 MG tablet Take 1 tablet (95 mg total) by mouth 3 (three) times daily as needed for pain. 05/12/23   Garrison, [place] N, FNP  RESTASIS 0.05 % ophthalmic emulsion  11/06/22   [provider]  Semaglutide,0.25 or 0.5MG /DOS, (OZEMPIC, 0.25 OR 0.5 MG/DOSE,) 2 MG/3ML SOPN Inject 0.25 mg into the skin once a week. 09/13/23   [name], Baird Kay, FNP  tranexamic acid (LYSTEDA) 650 MG TABS tablet TAKE TWO TABLETS BY MOUTH THREE TIMES A DAY . TAKE DURING MENSES FOR A MAXIMUM OF FIVE DAYS 01/13/23   Warden Fillers, MD  cetirizine (ZYRTEC ALLERGY) 10 MG tablet Take 1 tablet (10 mg total) by mouth daily. Patient not taking: Reported on 05/27/2020 11/26/19 11/08/20  Henderly, Britni A, PA-C    Family History Family History  Problem Relation Age of Onset   Healthy Mother    Healthy Father    Breast cancer Neg Hx     Social History Social History   Tobacco Use   Smoking status: Never   Smokeless tobacco: Never  Vaping Use   Vaping status: Never Used  Substance Use Topics   Alcohol use: No   Drug use: Not Currently    Types: Marijuana    Comment: CBD     Allergies   Patient has no known allergies.   Review of Systems Review of Systems  Constitutional:  Negative for chills and fever.  Gastrointestinal:  Negative for abdominal pain, diarrhea, nausea and vomiting.  Genitourinary:  Positive for frequency, vaginal bleeding and vaginal pain. Negative for difficulty urinating, dysuria, hematuria, urgency and vaginal discharge.     Physical Exam Triage Vital Signs ED Triage Vitals  Encounter Vitals Group     BP      Systolic BP Percentile      Diastolic BP  Percentile      Pulse      Resp      Temp      Temp src      SpO2      Weight      Height      Head Circumference      Peak Flow      Pain Score      Pain Loc      Pain Education      Exclude from Growth Chart  No data found.  Updated Vital Signs BP (!) 133/90 (BP Location: Left Arm)   Pulse 84   Temp 98.1 F (36.7 C) (Oral)   Resp 16   LMP 09/07/2023 (Approximate)   SpO2 97%   Visual Acuity Right Eye Distance:   Left Eye Distance:   Bilateral Distance:    Right Eye Near:   Left Eye Near:    Bilateral Near:     Physical Exam Vitals reviewed.  Constitutional:      General: She is awake.     Appearance: Normal appearance. She is well-developed and well-groomed.  HENT:     Head: Normocephalic and atraumatic.  Eyes:     General: Lids are normal. Gaze aligned appropriately.     Extraocular Movements: Extraocular movements intact.     Conjunctiva/sclera: Conjunctivae normal.  Pulmonary:     Effort: Pulmonary effort is normal.  Neurological:     General: No focal deficit present.     Mental Status: She is alert and oriented to person, place, and time.     GCS: GCS eye subscore is 4. GCS verbal subscore is 5. GCS motor subscore is 6.     Cranial Nerves: No cranial nerve deficit, dysarthria or facial asymmetry.  Psychiatric:        Attention and Perception: Attention and perception normal.        Mood and Affect: Mood and affect normal.        Speech: Speech normal.        Behavior: Behavior normal. Behavior is cooperative.        Thought Content: Thought content normal.        Judgment: Judgment normal.      UC Treatments / Results  Labs (all labs ordered are listed, but only abnormal results are displayed) Labs Reviewed  POCT URINALYSIS DIP (MANUAL ENTRY) - Abnormal; Notable for the following components:      Result Value   Glucose, UA >=1,000 (*)    All other components within normal limits  CERVICOVAGINAL ANCILLARY ONLY    EKG   Radiology No  results found.  Procedures Procedures (including critical care time)  Medications Ordered in UC Medications - No data to display  Initial Impression / Assessment and Plan / UC Course  I have reviewed the triage vital signs and the nursing notes.  Pertinent labs & imaging results that were available during my care of the patient were reviewed by me and considered in my medical decision making (see chart for details).      Final Clinical Impressions(s) / UC Diagnoses   Final diagnoses:  Vulvovaginal discomfort  Urinary frequency   Acute, ongoing Patient reports vulvovaginal discomfort along with light pink bleeding with wiping for about 2 weeks Urine dip was normal- results reviewed during apt. Will get cervicovaginal swab to check for BV, yeast and trich. Results to dictate further management  Patient declines gonorrhea/chlamydia testing today- states she and her partner are monogamous for over a decade.  Follow up as needed for persistent or progressing symptoms     Discharge Instructions      Your urine dip was normal. No evidence of UTI at this time. We have collected a cervicovaginal swab to check for bacterial vaginosis, yeast and trichomonas. We will keep you updated on the results once complete.      ED Prescriptions   None    PDMP not reviewed this encounter.   Providence Crosby, PA-C 09/14/23 [id number]

## 2023-09-14 NOTE — ED Triage Notes (Signed)
Pt states burning with urination and blood on the toilet paper when she wipes. States it has been for a few days.

## 2023-09-14 NOTE — Discharge Instructions (Addendum)
Your urine dip was normal. No evidence of UTI at this time. We have collected a cervicovaginal swab to check for bacterial vaginosis, yeast and trichomonas. We will keep you updated on the results once complete.

## 2023-09-15 LAB — CERVICOVAGINAL ANCILLARY ONLY
Bacterial Vaginitis (gardnerella): POSITIVE — AB
Candida Glabrata: NEGATIVE
Candida Vaginitis: POSITIVE — AB
Comment: NEGATIVE
Comment: NEGATIVE
Comment: NEGATIVE
Comment: NEGATIVE
Trichomonas: NEGATIVE

## 2023-09-16 ENCOUNTER — Telehealth (HOSPITAL_COMMUNITY): Payer: Self-pay

## 2023-09-16 MED ORDER — METRONIDAZOLE 1 % EX GEL: Freq: Every day | CUTANEOUS | 0 refills | Status: DC

## 2023-09-16 NOTE — Telephone Encounter (Signed)
Per protocol, pt requires tx with metronidazole and Diflucan. Reviewed with patient, verified pharmacy, prescription sent.

## 2023-09-22 ENCOUNTER — Other Ambulatory Visit (HOSPITAL_COMMUNITY): Payer: Self-pay

## 2023-09-22 ENCOUNTER — Ambulatory Visit (INDEPENDENT_AMBULATORY_CARE_PROVIDER_SITE_OTHER): Payer: Medicaid Other | Admitting: Nurse Practitioner

## 2023-09-22 ENCOUNTER — Encounter: Payer: Self-pay | Admitting: Nurse Practitioner

## 2023-09-22 VITALS — BP 121/73 | HR 84 | Temp 97.0°F | Wt 297.0 lb

## 2023-09-22 DIAGNOSIS — B20 Human immunodeficiency virus [HIV] disease: Secondary | ICD-10-CM | POA: Diagnosis not present

## 2023-09-22 DIAGNOSIS — N760 Acute vaginitis: Secondary | ICD-10-CM

## 2023-09-22 DIAGNOSIS — R809 Proteinuria, unspecified: Secondary | ICD-10-CM | POA: Diagnosis not present

## 2023-09-22 DIAGNOSIS — B3731 Acute candidiasis of vulva and vagina: Secondary | ICD-10-CM | POA: Insufficient documentation

## 2023-09-22 DIAGNOSIS — E1129 Type 2 diabetes mellitus with other diabetic kidney complication: Secondary | ICD-10-CM | POA: Diagnosis not present

## 2023-09-22 DIAGNOSIS — Z1211 Encounter for screening for malignant neoplasm of colon: Secondary | ICD-10-CM | POA: Diagnosis not present

## 2023-09-22 DIAGNOSIS — I10 Essential (primary) hypertension: Secondary | ICD-10-CM

## 2023-09-22 DIAGNOSIS — Z6841 Body Mass Index (BMI) 40.0 and over, adult: Secondary | ICD-10-CM

## 2023-09-22 DIAGNOSIS — E1165 Type 2 diabetes mellitus with hyperglycemia: Secondary | ICD-10-CM | POA: Diagnosis not present

## 2023-09-22 DIAGNOSIS — B9689 Other specified bacterial agents as the cause of diseases classified elsewhere: Secondary | ICD-10-CM | POA: Insufficient documentation

## 2023-09-22 LAB — POCT GLYCOSYLATED HEMOGLOBIN (HGB A1C): Hemoglobin A1C: 10.8 % — AB (ref 4.0–5.6)

## 2023-09-22 MED ORDER — SEMAGLUTIDE(0.25 OR 0.5MG/DOS) 2 MG/3ML ~~LOC~~ SOPN
0.5000 mg | PEN_INJECTOR | SUBCUTANEOUS | 0 refills | Status: DC
  Filled 2023-09-22: qty 3, 28d supply, fill #0

## 2023-09-22 MED ORDER — SEMAGLUTIDE (1 MG/DOSE) 4 MG/3ML ~~LOC~~ SOPN
1.0000 mg | PEN_INJECTOR | SUBCUTANEOUS | 0 refills | Status: DC
  Filled 2023-09-22: qty 3, 28d supply, fill #0

## 2023-09-22 MED ORDER — INSULIN GLARGINE-YFGN 100 UNIT/ML ~~LOC~~ SOPN: 38.0000 [IU] | PEN_INJECTOR | Freq: Two times a day (BID) | SUBCUTANEOUS | 0 refills | Status: DC

## 2023-09-22 MED ORDER — FREESTYLE LIBRE 3 SENSOR MISC
1.0000 | 3 refills | Status: DC | PRN
  Filled 2023-09-22: qty 2, fill #0
  Filled 2023-09-22 (×2): qty 2, 28d supply, fill #0

## 2023-09-22 NOTE — Progress Notes (Signed)
 Established Patient Office Visit  Subjective:  Patient ID: Beverly Johnson, female    DOB: [birth year]  Age: 48 y.o. MRN: [id number]  CC:  Chief Complaint  Patient presents with   Diabetes    HPI Beverly Johnson is a 48 y.o. female  has a past medical history of Back pain, DM2 (diabetes mellitus, type 2) (HCC), HIV infection (HCC), Human immunodeficiency virus (HIV) disease (HCC) (09/29/2006), Menorrhagia (11/05/2022), Microalbuminuria due to type 2 diabetes mellitus (HCC), Obesity (04/27/2012), Primary hypertension (03/02/2023), Uncontrolled type 2 diabetes mellitus with hyperglycemia (HCC) (03/02/2023), and Vitamin D  deficiency (03/21/2017).  Patient presents for follow-up for her chronic medical conditions  Uncontrolled type 2 diabetes.  Currently on Ozempic  0.25 mg once weekly tolerating medication well, has prescription for Semglee  48 units twice daily but states that she has not been taking the medication consistently.  She works at [employer] reports that her diet can be better, states that she does a lot of walking at work.  Hypertension.  Currently on lisinopril  5 mg daily.  Patient denies chest pain shortness of breath edema         Past Medical History:  Diagnosis Date   Back pain    DM2 (diabetes mellitus, type 2) (HCC)    HIV infection (HCC)    Human immunodeficiency virus (HIV) disease (HCC) 09/29/2006   Qualifier: Diagnosis of   By: Janna MD, Kelly         Menorrhagia 11/05/2022   Microalbuminuria due to type 2 diabetes mellitus (HCC)    Obesity 04/27/2012   Current weight 279lb 04/27/12     Primary hypertension 03/02/2023   Uncontrolled type 2 diabetes mellitus with hyperglycemia (HCC) 03/02/2023   Vitamin D  deficiency 03/21/2017    Past Surgical History:  Procedure Laterality Date   CESAREAN SECTION     x3   CHOLECYSTECTOMY  06/29/2011   Procedure: LAPAROSCOPIC CHOLECYSTECTOMY WITH INTRAOPERATIVE CHOLANGIOGRAM;  Surgeon: Deward GORMAN Curvin DOUGLAS, MD;   Location: [facility];  Service: General;  Laterality: N/A;   TUBAL LIGATION      Family History  Problem Relation Age of Onset   Healthy Mother    Healthy Father    Breast cancer Neg Hx     Social History   Socioeconomic History   Marital status: Married    Spouse name: Not on file   Number of children: Not on file   Years of education: 10   Highest education level: Not on file  Occupational History    Employer: UNEMPLOYED  Tobacco Use   Smoking status: Never   Smokeless tobacco: Never  Vaping Use   Vaping status: Never Used  Substance and Sexual Activity   Alcohol use: No   Drug use: Not Currently    Types: Marijuana    Comment: CBD   Sexual activity: Yes    Partners: Male    Birth control/protection: None  Other Topics Concern   Not on file  Social History Narrative   Not on file   Social Drivers of Health   Financial Resource Strain: Not on file  Food Insecurity: No Food Insecurity (02/24/2021)   Hunger Vital Sign    Worried About Running Out of Food in the Last Year: Never true    Ran Out of Food in the Last Year: Never true  Transportation Needs: No Transportation Needs (02/24/2021)   PRAPARE - Administrator, Civil Service (Medical): No    Lack of Transportation (Non-Medical): No  Physical  Activity: Not on file  Stress: Not on file  Social Connections: Not on file  Intimate Partner Violence: Not on file    Outpatient Medications Prior to Visit  Medication Sig Dispense Refill   Accu-Chek Softclix Lancets lancets 1 each 3 (three) times daily.     GENVOYA  150-150-200-10 MG TABS tablet TAKE ONE TABLET BY MOUTH DAILY WITH BREAKFAST 30 tablet 4   GLOBAL EASE INJECT PEN NEEDLES 32G X 4 MM MISC      glucose blood (ACCU-CHEK GUIDE) test strip USE AS INSTRUCTED 100 each 10   glucose blood test strip Check your sugar in the morning before you eat breakfast, and one hour after a meal. 100 each 2   ibuprofen  (ADVIL ) 800 MG tablet Take 1 tablet (800 mg  total) by mouth every 8 (eight) hours as needed. 20 tablet 0   lisinopril  (ZESTRIL ) 5 MG tablet Take 1 tablet (5 mg total) by mouth daily. 60 tablet 1   metroNIDAZOLE  (METROGEL ) 1 % gel Apply topically daily. 45 g 0   RESTASIS 0.05 % ophthalmic emulsion      tranexamic acid  (LYSTEDA ) 650 MG TABS tablet TAKE TWO TABLETS BY MOUTH THREE TIMES A DAY . TAKE DURING MENSES FOR A MAXIMUM OF FIVE DAYS 30 tablet 6   insulin  glargine-yfgn (SEMGLEE ) 100 UNIT/ML Pen INJECT 48 UNITS INTO THE SKIN TWICE A DAY 15 mL 0   Semaglutide ,0.25 or 0.5MG /DOS, (OZEMPIC , 0.25 OR 0.5 MG/DOSE,) 2 MG/3ML SOPN Inject 0.25 mg into the skin once a week. 3 mL 0   cyclobenzaprine  (FLEXERIL ) 10 MG tablet Take 1 tablet by mouth 3 times daily as needed for muscle spasm. Warning: May cause drowsiness. (Patient not taking: Reported on 09/22/2023) 21 tablet 0   phenazopyridine  (PYRIDIUM ) 95 MG tablet Take 1 tablet (95 mg total) by mouth 3 (three) times daily as needed for pain. 10 tablet 0   No facility-administered medications prior to visit.    No Known Allergies  ROS Review of Systems  Constitutional:  Negative for appetite change, chills, fatigue and fever.  HENT:  Negative for congestion, postnasal drip, rhinorrhea and sneezing.   Respiratory:  Negative for cough, shortness of breath and wheezing.   Cardiovascular:  Negative for chest pain, palpitations and leg swelling.  Gastrointestinal:  Negative for abdominal pain, constipation, nausea and vomiting.  Genitourinary:  Negative for difficulty urinating, dysuria, flank pain and frequency.  Musculoskeletal:  Negative for arthralgias, back pain, joint swelling and myalgias.  Skin:  Negative for color change, pallor, rash and wound.  Neurological:  Negative for dizziness, facial asymmetry, weakness, numbness and headaches.  Psychiatric/Behavioral:  Negative for behavioral problems, confusion, self-injury and suicidal ideas.       Objective:    Physical Exam Vitals and  nursing note reviewed.  Constitutional:      General: She is not in acute distress.    Appearance: Normal appearance. She is obese. She is not ill-appearing, toxic-appearing or diaphoretic.  HENT:     Mouth/Throat:     Mouth: Mucous membranes are moist.     Pharynx: Oropharynx is clear. No oropharyngeal exudate or posterior oropharyngeal erythema.  Eyes:     General: No scleral icterus.       Right eye: No discharge.        Left eye: No discharge.     Extraocular Movements: Extraocular movements intact.     Conjunctiva/sclera: Conjunctivae normal.  Cardiovascular:     Rate and Rhythm: Normal rate and regular rhythm.  Pulses: Normal pulses.     Heart sounds: Normal heart sounds. No murmur heard.    No friction rub. No gallop.  Pulmonary:     Effort: Pulmonary effort is normal. No respiratory distress.     Breath sounds: Normal breath sounds. No stridor. No wheezing, rhonchi or rales.  Chest:     Chest wall: No tenderness.  Abdominal:     General: There is no distension.     Palpations: Abdomen is soft.     Tenderness: There is no abdominal tenderness. There is no right CVA tenderness, left CVA tenderness or guarding.  Musculoskeletal:        General: No swelling, tenderness, deformity or signs of injury.     Right lower leg: No edema.     Left lower leg: No edema.  Skin:    General: Skin is warm and dry.     Capillary Refill: Capillary refill takes less than 2 seconds.     Coloration: Skin is not jaundiced or pale.     Findings: No bruising, erythema or lesion.  Neurological:     Mental Status: She is alert and oriented to person, place, and time.     Motor: No weakness.     Coordination: Coordination normal.     Gait: Gait normal.  Psychiatric:        Mood and Affect: Mood normal.        Behavior: Behavior normal.        Thought Content: Thought content normal.        Judgment: Judgment normal.     BP 121/73   Pulse 84   Temp (!) 97 F (36.1 C)   Wt 297 lb  (134.7 kg)   LMP 09/07/2023 (Approximate)   SpO2 100%   BMI 46.52 kg/m  Wt Readings from Last 3 Encounters:  09/22/23 297 lb (134.7 kg)  06/23/23 287 lb (130.2 kg)  06/18/23 287 lb (130.2 kg)    Lab Results  Component Value Date   TSH 1.105 06/17/2019   Lab Results  Component Value Date   WBC 3.8 (L) 04/01/2023   HGB 10.4 (L) 04/01/2023   HCT 34.1 (L) 04/01/2023   MCV 78.2 (L) 04/01/2023   PLT 161 04/01/2023   Lab Results  Component Value Date   NA 133 (L) 04/01/2023   K 3.5 04/01/2023   CO2 23 04/01/2023   GLUCOSE 211 (H) 04/01/2023   BUN 8 04/01/2023   CREATININE 0.71 04/01/2023   BILITOT 0.4 04/01/2023   ALKPHOS 111 04/01/2023   AST 21 04/01/2023   ALT 15 04/01/2023   PROT 7.7 04/01/2023   ALBUMIN 3.1 (L) 04/01/2023   CALCIUM  8.3 (L) 04/01/2023   ANIONGAP 4 (L) 04/01/2023   Lab Results  Component Value Date   CHOL 148 11/05/2020   Lab Results  Component Value Date   HDL 56 11/05/2020   Lab Results  Component Value Date   LDLCALC 73 11/05/2020   Lab Results  Component Value Date   TRIG 108 11/05/2020   Lab Results  Component Value Date   CHOLHDL 2.6 11/05/2020   Lab Results  Component Value Date   HGBA1C 10.8 (A) 09/22/2023      Assessment & Plan:   Problem List Items Addressed This Visit       Cardiovascular and Mediastinum   Primary hypertension   Hypertension well-controlled on lisinopril  5 mg daily Continue current medication DASH diet and commitment to daily physical activity for a minimum of  30 minutes discussed and encouraged, as a part of hypertension management. The importance of attaining a healthy weight is also discussed. CMP today     09/22/2023    9:34 AM 09/14/2023    8:55 AM 06/23/2023   12:42 PM 06/23/2023   10:05 AM 06/23/2023   10:00 AM 06/18/2023   10:20 AM 06/11/2023    8:18 AM  BP/Weight  Systolic BP 121 133 140  154 128 136  Diastolic BP 73 90 96  98 83 80  Wt. (Lbs) 297   287  287 286  BMI 46.52 kg/m2    44.95 kg/m2  44.95 kg/m2 44.79 kg/m2             Endocrine   Microalbuminuria due to type 2 diabetes mellitus (HCC)   Checking urine microalbumin labs Patient is on Ozempic  Need to get diabetes under control discussed      Relevant Medications   Semaglutide ,0.25 or 0.5MG /DOS, 2 MG/3ML SOPN   Semaglutide , 1 MG/DOSE, 4 MG/3ML SOPN (Start on 10/20/2023)   insulin  glargine-yfgn (SEMGLEE ) 100 UNIT/ML Pen   Uncontrolled type 2 diabetes mellitus with hyperglycemia (HCC) - Primary   Lab Results  Component Value Date   HGBA1C 10.8 (A) 09/22/2023  A1c worse from 9.5 ,3 months ago Increase Ozempic  to 0.5 mg once weekly, after 4 weeks increase to 1 mg once weekly Will decrease Semglee  to 38 units twice daily Patient counseled on low-carb diet Encouraged engage in regular moderate to vigorous exercise at least 150 minutes weekly Currently not on a statin checking lipid panel CBG goals discussed Freestyle libre ordered Patient referred to the clinical pharmacist for assistance with management of type 2 diabetes      Relevant Medications   Semaglutide ,0.25 or 0.5MG /DOS, 2 MG/3ML SOPN   Semaglutide , 1 MG/DOSE, 4 MG/3ML SOPN (Start on 10/20/2023)   insulin  glargine-yfgn (SEMGLEE ) 100 UNIT/ML Pen   Continuous Glucose Sensor (FREESTYLE LIBRE 3 SENSOR) MISC   Other Relevant Orders   Lipid panel   Basic Metabolic Panel   AMB Referral [facility] Care Management   Microalbumin / creatinine urine ratio   POCT glycosylated hemoglobin (Hb A1C) (Completed)     Genitourinary   Vaginal candida   Recently diagnosed at her visit to the emergency room Treated with fluconazole  Working on getting diabetes under control      BV (bacterial vaginosis)   Recently diagnosed at her visit to the emergency room Currently on metronidazole  1% gel daily Continue current medication        Other   Human immunodeficiency virus (HIV) disease (HCC) (Chronic)   She is followed by infectious disease specialist  ,continue Genvoya       Morbid obesity with BMI of 45.0-49.9, adult (HCC)   Wt Readings from Last 3 Encounters:  09/22/23 297 lb (134.7 kg)  06/23/23 287 lb (130.2 kg)  06/18/23 287 lb (130.2 kg)   Body mass index is 46.52 kg/m.  Patient counseled on low-carb diet Encouraged engage in regular moderate to vigorous exercises at least 150 minutes weekly She is currently on Ozempic  for type 2 diabetes      Relevant Medications   Semaglutide ,0.25 or 0.5MG /DOS, 2 MG/3ML SOPN   Semaglutide , 1 MG/DOSE, 4 MG/3ML SOPN (Start on 10/20/2023)   insulin  glargine-yfgn (SEMGLEE ) 100 UNIT/ML Pen   Other Visit Diagnoses       Screening for colon cancer       Relevant Orders   Cologuard       Meds ordered this encounter  Medications   Semaglutide ,0.25 or 0.5MG /DOS, 2 MG/3ML SOPN    Sig: Inject 0.5 mg into the skin once a week.    Dispense:  3 mL    Refill:  0   Semaglutide , 1 MG/DOSE, 4 MG/3ML SOPN    Sig: Inject 1 mg as directed once a week.    Dispense:  3 mL    Refill:  0   insulin  glargine-yfgn (SEMGLEE ) 100 UNIT/ML Pen    Sig: Inject 38 Units into the skin 2 (two) times daily.    Dispense:  15 mL    Refill:  0   Continuous Glucose Sensor (FREESTYLE LIBRE 3 SENSOR) MISC    Sig: Place 1 sensor on the skin every 14 days. Use to check glucose continuously    Dispense:  2 each    Refill:  3    Follow-up: Return in about 3 months (around 12/20/2023).    Elvena Oyer R Lamiracle Chaidez, FNP

## 2023-09-22 NOTE — Assessment & Plan Note (Addendum)
 Recently diagnosed at her visit to the emergency room Treated with fluconazole  Working on getting diabetes under control

## 2023-09-22 NOTE — Assessment & Plan Note (Signed)
 Checking urine microalbumin labs Patient is on Ozempic  Need to get diabetes under control discussed

## 2023-09-22 NOTE — Assessment & Plan Note (Signed)
 Wt Readings from Last 3 Encounters:  09/22/23 297 lb (134.7 kg)  06/23/23 287 lb (130.2 kg)  06/18/23 287 lb (130.2 kg)   Body mass index is 46.52 kg/m.  Patient counseled on low-carb diet Encouraged engage in regular moderate to vigorous exercises at least 150 minutes weekly She is currently on Ozempic  for type 2 diabetes

## 2023-09-22 NOTE — Assessment & Plan Note (Addendum)
 Recently diagnosed at her visit to the emergency room Currently on metronidazole  1% gel daily Continue current medication

## 2023-09-22 NOTE — Assessment & Plan Note (Signed)
 Lab Results  Component Value Date   HGBA1C 10.8 (A) 09/22/2023  A1c worse from 9.5 ,3 months ago Increase Ozempic  to 0.5 mg once weekly, after 4 weeks increase to 1 mg once weekly Will decrease Semglee  to 38 units twice daily Patient counseled on low-carb diet Encouraged engage in regular moderate to vigorous exercise at least 150 minutes weekly Currently not on a statin checking lipid panel CBG goals discussed Freestyle libre ordered Patient referred to the clinical pharmacist for assistance with management of type 2 diabetes

## 2023-09-22 NOTE — Assessment & Plan Note (Signed)
 Hypertension well-controlled on lisinopril  5 mg daily Continue current medication DASH diet and commitment to daily physical activity for a minimum of 30 minutes discussed and encouraged, as a part of hypertension management. The importance of attaining a healthy weight is also discussed. CMP today     09/22/2023    9:34 AM 09/14/2023    8:55 AM 06/23/2023   12:42 PM 06/23/2023   10:05 AM 06/23/2023   10:00 AM 06/18/2023   10:20 AM 06/11/2023    8:18 AM  BP/Weight  Systolic BP 121 133 140  154 128 136  Diastolic BP 73 90 96  98 83 80  Wt. (Lbs) 297   287  287 286  BMI 46.52 kg/m2   44.95 kg/m2  44.95 kg/m2 44.79 kg/m2

## 2023-09-22 NOTE — Patient Instructions (Addendum)
 Goal for fasting blood sugar ranges from 80 to 120 and 2 hours after any meal or at bedtime should be between 130 to 170.   Start Ozempic  0.5 mg once weekly for 4 weeks.  After 4 weeks increase Ozempic  to 1 mg once weekly  Decrease Semglee  to 38 units twice daily Please check your sugar daily and reports blood sugar readings consistently less than 70.    1. Screening for colon cancer (Primary)  - Cologuard  2. Uncontrolled type 2 diabetes mellitus with hyperglycemia (HCC)  - Lipid panel - Basic Metabolic Panel - Semaglutide ,0.25 or 0.5MG /DOS, 2 MG/3ML SOPN; Inject 0.5 mg into the skin once a week.  Dispense: 3 mL; Refill: 0 - Semaglutide , 1 MG/DOSE, 4 MG/3ML SOPN; Inject 1 mg as directed once a week.  Dispense: 3 mL; Refill: 0 - insulin  glargine-yfgn (SEMGLEE ) 100 UNIT/ML Pen; Inject 38 Units into the skin 2 (two) times daily.  Dispense: 15 mL; Refill: 0    It is important that you exercise regularly at least 30 minutes 5 times a week as tolerated  Think about what you will eat, plan ahead. Choose  clean, green, fresh or frozen over canned, processed or packaged foods which are more sugary, salty and fatty. 70 to 75% of food eaten should be vegetables and fruit. Three meals at set times with snacks allowed between meals, but they must be fruit or vegetables. Aim to eat over a 12 hour period , example 7 am to 7 pm, and STOP after  your last meal of the day. Drink water,generally about 64 ounces per day, no other drink is as healthy. Fruit juice is best enjoyed in a healthy way, by EATING the fruit.  Thanks for choosing [facility] we consider it a privelige to serve you.

## 2023-09-22 NOTE — Assessment & Plan Note (Signed)
 She is followed by infectious disease specialist ,continue Genvoya 

## 2023-09-23 ENCOUNTER — Other Ambulatory Visit (HOSPITAL_COMMUNITY): Payer: Self-pay

## 2023-09-23 LAB — LIPID PANEL
Chol/HDL Ratio: 2.7 {ratio} (ref 0.0–4.4)
Cholesterol, Total: 128 mg/dL (ref 100–199)
HDL: 47 mg/dL (ref >39)
LDL Chol Calc (NIH): 61 mg/dL (ref 0–99)
Triglycerides: 109 mg/dL (ref 0–149)
VLDL Cholesterol Cal: 20 mg/dL (ref 5–40)

## 2023-09-23 LAB — BASIC METABOLIC PANEL
BUN/Creatinine Ratio: 12 (ref 9–23)
BUN: 9 mg/dL (ref 6–24)
CO2: 21 mmol/L (ref 20–29)
Calcium: 9 mg/dL (ref 8.7–10.2)
Chloride: 100 mmol/L (ref 96–106)
Creatinine, Ser: 0.75 mg/dL (ref 0.57–1.00)
Glucose: 296 mg/dL — ABNORMAL HIGH (ref 70–99)
Potassium: 4 mmol/L (ref 3.5–5.2)
Sodium: 135 mmol/L (ref 134–144)
eGFR: 100 mL/min/{1.73_m2} (ref >59)

## 2023-09-23 LAB — MICROALBUMIN / CREATININE URINE RATIO
Creatinine, Urine: 90.1 mg/dL
Microalb/Creat Ratio: <3 mg/g{creat} (ref 0–29)
Microalbumin, Urine: <3 ug/mL

## 2023-09-29 ENCOUNTER — Other Ambulatory Visit (HOSPITAL_COMMUNITY): Payer: Self-pay

## 2023-10-02 ENCOUNTER — Other Ambulatory Visit (HOSPITAL_COMMUNITY): Payer: Self-pay

## 2023-10-06 ENCOUNTER — Other Ambulatory Visit: Payer: Self-pay | Admitting: Obstetrics and Gynecology

## 2023-10-06 DIAGNOSIS — N920 Excessive and frequent menstruation with regular cycle: Secondary | ICD-10-CM

## 2023-10-19 ENCOUNTER — Telehealth: Payer: Self-pay

## 2023-10-19 NOTE — Progress Notes (Signed)
   10/19/2023  Patient ID: Beverly Johnson, female   DOB: 1976/02/03, 48 y.o.   MRN: [id number]  Contacted patient regarding referral for medication adherence from Donell Beers, FNP .   Left message with patient's husband for her to return call at her convenience.    Harlon Flor, PharmD Clinical Pharmacist  [phone]

## 2023-10-25 NOTE — Progress Notes (Signed)
   10/25/2023  Patient ID: Beverly Johnson, female   DOB: 1976-06-08, 48 y.o.   MRN: [id number]  Contacted patient regarding referral for medication adherence from Donell Beers, FNP .   Patient called back but declined the pharmacy visit. She did not wish to review her medications, discuss diabetes management, nor addressing adherence barriers. Patient was adamant that she's ok and didn't wish to speak with a clinical pharmacist.   The Population Health team is pleased to engage with this patient at any time in the future upon receipt of referral and should he/she be interested in assistance from the [facility] team.    Harlon Flor, PharmD Clinical Pharmacist  [phone]

## 2023-10-25 NOTE — Progress Notes (Signed)
   10/25/2023  Patient ID: Beverly Johnson, female   DOB: [birth year], 48 y.o.   MRN: [id number]  Contacted patient regarding referral for medication adherence from Donell Beers, FNP .   Left message on patient's voicemail for her to return call at her convenience.    Harlon Flor, PharmD Clinical Pharmacist  [phone]

## 2023-10-28 ENCOUNTER — Other Ambulatory Visit: Payer: Self-pay | Admitting: Nurse Practitioner

## 2023-10-28 DIAGNOSIS — E1165 Type 2 diabetes mellitus with hyperglycemia: Secondary | ICD-10-CM

## 2023-10-28 NOTE — Telephone Encounter (Unsigned)
 Copied from CRM [id number]. Topic: Clinical - Medication Refill >> Oct 28, 2023  9:58 AM Benetta Spar B wrote: Most Recent Primary Care Visit:  Provider: Donell Beers  Department: [facility]  Visit Type: OFFICE VISIT  Date: 09/22/2023  Medication: Semaglutide 1 MG/DOSE, 4 MG/3ML SOPN  Has the patient contacted their pharmacy? yes (Agent: If yes, when and what did the pharmacy advise?)contact pcp  Is this the correct pharmacy for this prescription? yes  This is the patient's preferred pharmacy:  [facility] - [place], [place] - [address] [facility] [address] [place] [address] [place] [address] Phone: [phone] Fax: [phone]    Has the prescription been filled recently? no  Is the patient out of the medication? yes  Has the patient been seen for an appointment in the last year OR does the patient have an upcoming appointment? yes  Can we respond through MyChart? yes  Agent: Please be advised that Rx refills may take up to 3 business days. We ask that you follow-up with your pharmacy.

## 2023-10-29 MED ORDER — SEMAGLUTIDE (1 MG/DOSE) 4 MG/3ML ~~LOC~~ SOPN: 1.0000 mg | PEN_INJECTOR | SUBCUTANEOUS | 0 refills | Status: DC

## 2023-11-02 ENCOUNTER — Other Ambulatory Visit: Payer: Self-pay | Admitting: Nurse Practitioner

## 2023-11-02 DIAGNOSIS — E1165 Type 2 diabetes mellitus with hyperglycemia: Secondary | ICD-10-CM

## 2023-11-23 ENCOUNTER — Other Ambulatory Visit: Payer: Self-pay | Admitting: Nurse Practitioner

## 2023-11-23 DIAGNOSIS — E1165 Type 2 diabetes mellitus with hyperglycemia: Secondary | ICD-10-CM

## 2023-12-17 ENCOUNTER — Ambulatory Visit: Payer: Medicaid Other | Admitting: Internal Medicine

## 2023-12-20 ENCOUNTER — Other Ambulatory Visit: Payer: Self-pay | Admitting: Nurse Practitioner

## 2023-12-20 DIAGNOSIS — E1165 Type 2 diabetes mellitus with hyperglycemia: Secondary | ICD-10-CM

## 2023-12-22 ENCOUNTER — Ambulatory Visit (INDEPENDENT_AMBULATORY_CARE_PROVIDER_SITE_OTHER): Payer: Self-pay | Admitting: Nurse Practitioner

## 2023-12-22 ENCOUNTER — Encounter: Payer: Self-pay | Admitting: Nurse Practitioner

## 2023-12-22 VITALS — BP 130/81 | HR 79 | Temp 97.0°F | Wt 282.0 lb

## 2023-12-22 DIAGNOSIS — Z1211 Encounter for screening for malignant neoplasm of colon: Secondary | ICD-10-CM | POA: Diagnosis not present

## 2023-12-22 DIAGNOSIS — R809 Proteinuria, unspecified: Secondary | ICD-10-CM

## 2023-12-22 DIAGNOSIS — I10 Essential (primary) hypertension: Secondary | ICD-10-CM

## 2023-12-22 DIAGNOSIS — E1129 Type 2 diabetes mellitus with other diabetic kidney complication: Secondary | ICD-10-CM | POA: Diagnosis not present

## 2023-12-22 DIAGNOSIS — E1165 Type 2 diabetes mellitus with hyperglycemia: Secondary | ICD-10-CM | POA: Diagnosis not present

## 2023-12-22 DIAGNOSIS — B20 Human immunodeficiency virus [HIV] disease: Secondary | ICD-10-CM | POA: Diagnosis not present

## 2023-12-22 DIAGNOSIS — Z6841 Body Mass Index (BMI) 40.0 and over, adult: Secondary | ICD-10-CM | POA: Diagnosis not present

## 2023-12-22 LAB — POCT GLYCOSYLATED HEMOGLOBIN (HGB A1C): Hemoglobin A1C: 11.2 % — AB (ref 4.0–5.6)

## 2023-12-22 MED ORDER — INSULIN GLARGINE-YFGN 100 UNIT/ML ~~LOC~~ SOPN: 38.0000 [IU] | PEN_INJECTOR | Freq: Two times a day (BID) | SUBCUTANEOUS | 4 refills | Status: DC

## 2023-12-22 MED ORDER — LISINOPRIL 5 MG PO TABS: 5.0000 mg | ORAL_TABLET | Freq: Every day | ORAL | 1 refills | Status: DC

## 2023-12-22 MED ORDER — SEMAGLUTIDE (2 MG/DOSE) 8 MG/3ML ~~LOC~~ SOPN
2.0000 mg | PEN_INJECTOR | SUBCUTANEOUS | 3 refills | Status: DC
Start: 2023-12-22 — End: 2024-03-28

## 2023-12-22 NOTE — Patient Instructions (Addendum)
   Eye doctor  [address] [place] [address]  P:  [phone] F:  [phone]  One of our clinical pharmacist will be calling you to check on you regarding management of your type 2 diabetes I have referred you for diabetes nutrition education I also referred you to the endocrinologist  Goal for fasting blood sugar ranges from 80 to 120 and 2 hours after any meal or at bedtime should be between 130 to 170.     1. Uncontrolled type 2 diabetes mellitus with hyperglycemia (HCC) (Primary)  - POCT glycosylated hemoglobin (Hb A1C) - AMB Referral VBCI Care Management - Semaglutide , 2 MG/DOSE, 8 MG/3ML SOPN; Inject 2 mg as directed once a week.  Dispense: 3 mL; Refill: 3 - Ambulatory referral to Endocrinology - Ambulatory referral to diabetic education - insulin  glargine-yfgn (SEMGLEE ) 100 UNIT/ML Pen; Inject 38 Units into the skin in the morning and at bedtime.  Dispense: 15 mL; Refill: 4  2. Primary hypertension  - lisinopril  (ZESTRIL ) 5 MG tablet; Take 1 tablet (5 mg total) by mouth daily.  Dispense: 90 tablet; Refill: 1      It is important that you exercise regularly at least 30 minutes 5 times a week as tolerated  Think about what you will eat, plan ahead. Choose " clean, green, fresh or frozen" over canned, processed or packaged foods which are more sugary, salty and fatty. 70 to 75% of food eaten should be vegetables and fruit. Three meals at set times with snacks allowed between meals, but they must be fruit or vegetables. Aim to eat over a 12 hour period , example 7 am to 7 pm, and STOP after  your last meal of the day. Drink water,generally about 64 ounces per day, no other drink is as healthy. Fruit juice is best enjoyed in a healthy way, by EATING the fruit.  Thanks for choosing [facility] we consider it a privelige to serve you.

## 2023-12-22 NOTE — Assessment & Plan Note (Signed)
 Lisinopril  5 mg daily refilled DASH diet and commitment to daily physical activity for a minimum of 30 minutes discussed and encouraged, as a part of hypertension management. The importance of attaining a healthy weight is also discussed. BMP at next visit     12/22/2023    9:08 AM 09/22/2023    9:34 AM 09/14/2023    8:55 AM 06/23/2023   12:42 PM 06/23/2023   10:05 AM 06/23/2023   10:00 AM 06/18/2023   10:20 AM  BP/Weight  Systolic BP 130 121 133 140  154 128  Diastolic BP 81 73 90 96  98 83  Wt. (Lbs) 282 297   287  287  BMI 44.17 kg/m2 46.52 kg/m2   44.95 kg/m2  44.95 kg/m2

## 2023-12-22 NOTE — Assessment & Plan Note (Signed)
 Wt Readings from Last 3 Encounters:  12/22/23 282 lb (127.9 kg)  09/22/23 297 lb (134.7 kg)  06/23/23 287 lb (130.2 kg)   Body mass index is 44.17 kg/m.  Patient counseled on low-carb diet Encouraged to engage in regular moderate exercise at least 150 minutes weekly as tolerated Increasing Ozempic  to 2 mg once weekly

## 2023-12-22 NOTE — Assessment & Plan Note (Signed)
 Continue Genvoya  daily Maintain close follow-up with infectious disease specialist

## 2023-12-22 NOTE — Assessment & Plan Note (Addendum)
 Lab Results  Component Value Date   HGBA1C 11.2 (A) 12/22/2023  Chronic uncontrolled condition Increase Ozempic  to 2 mg once weekly, take Semglee  38 units twice daily She is not interested in taking pills, patient referred to the clinical pharmacist and endocrinologist.  Referral for diabetic nutrition education placed, encouraged to get diabetes eye exam completed Patient counseled on low-carb diet CBG goals discussed Encouraged to engage in regular moderate exercise at least  150 minutes weekly as tolerated Need to get diabetes under control to prevent complications discussed Stated that her pharmacy did not give her freestyle libre, freestyle [place] reordered today, the  nurse has called the pharmacy relating to this  Follow-up in 3 months

## 2023-12-22 NOTE — Assessment & Plan Note (Addendum)
 Encouraged to take lisinopril  5 mg daily as ordered Medication refilled Recent urine microalbumin was normal Lab Results  Component Value Date   LABMICR <3.0 09/22/2023   LABMICR 5.7 11/24/2022   MICROALBUR 0.84 02/27/2014   MICROALBUR 0.76 04/27/2012

## 2023-12-22 NOTE — Progress Notes (Signed)
 Established Patient Office Visit  Subjective:  Patient ID: Beverly Johnson, female    DOB: 1976-08-13  Age: 48 y.o. MRN: [id number]  CC:  Chief Complaint  Patient presents with   Diabetes    HPI Beverly Johnson is a 48 y.o. female  has a past medical history of Back pain, DM2 (diabetes mellitus, type 2) (HCC), HIV infection (HCC), Human immunodeficiency virus (HIV) disease (HCC) (09/29/2006), Menorrhagia (11/05/2022), Microalbuminuria due to type 2 diabetes mellitus (HCC), Obesity (04/27/2012), Primary hypertension (03/02/2023), Uncontrolled type 2 diabetes mellitus with hyperglycemia (HCC) (03/02/2023), and Vitamin D  deficiency (03/21/2017).  Patient presents for follow-up for her chronic medical conditions  Hypertension.  Currently on lisinopril  5 mg daily.  She denies chest pain shortness of breath edema  Uncontrolled type 2 diabetes.  Currently on Ozempic  1 mg once weekly, Semglee  35 units twice daily.  She works at [employer] , she does not follow a low-carb diet and does not exercise stated that it is hard to eat healthy while working at [employer].  States that she does not check her blood sugar daily, random blood sugar readings has been in the 200s to 300s.  Currently denies polyuria polyphagia polydipsia.     Past Medical History:  Diagnosis Date   Back pain    DM2 (diabetes mellitus, type 2) (HCC)    HIV infection (HCC)    Human immunodeficiency virus (HIV) disease (HCC) 09/29/2006   Qualifier: Diagnosis of   By: Ferrell Hu MD, Kelly         Menorrhagia 11/05/2022   Microalbuminuria due to type 2 diabetes mellitus (HCC)    Obesity 04/27/2012   Current weight 279lb 04/27/12     Primary hypertension 03/02/2023   Uncontrolled type 2 diabetes mellitus with hyperglycemia (HCC) 03/02/2023   Vitamin D  deficiency 03/21/2017    Past Surgical History:  Procedure Laterality Date   CESAREAN SECTION     x3   CHOLECYSTECTOMY  06/29/2011   Procedure: LAPAROSCOPIC CHOLECYSTECTOMY  WITH INTRAOPERATIVE CHOLANGIOGRAM;  Surgeon: Mayme Spearman, MD;  Location: [facility] OR;  Service: General;  Laterality: N/A;   TUBAL LIGATION      Family History  Problem Relation Age of Onset   Healthy Mother    Healthy Father    Breast cancer Neg Hx     Social History   Socioeconomic History   Marital status: Married    Spouse name: Not on file   Number of children: Not on file   Years of education: 10   Highest education level: Not on file  Occupational History    Employer: UNEMPLOYED  Tobacco Use   Smoking status: Never   Smokeless tobacco: Never  Vaping Use   Vaping status: Never Used  Substance and Sexual Activity   Alcohol use: No   Drug use: Not Currently    Types: Marijuana    Comment: CBD   Sexual activity: Yes    Partners: Male    Birth control/protection: None  Other Topics Concern   Not on file  Social History Narrative   Not on file   Social Drivers of Health   Financial Resource Strain: Not on file  Food Insecurity: No Food Insecurity (02/24/2021)   Hunger Vital Sign    Worried About Running Out of Food in the Last Year: Never true    Ran Out of Food in the Last Year: Never true  Transportation Needs: No Transportation Needs (02/24/2021)   PRAPARE - Transportation    Lack of  Transportation (Medical): No    Lack of Transportation (Non-Medical): No  Physical Activity: Not on file  Stress: Not on file  Social Connections: Not on file  Intimate Partner Violence: Not on file    Outpatient Medications Prior to Visit  Medication Sig Dispense Refill   Accu-Chek Softclix Lancets lancets 1 each 3 (three) times daily.     GENVOYA  150-150-200-10 MG TABS tablet TAKE ONE TABLET BY MOUTH DAILY WITH BREAKFAST 30 tablet 4   GLOBAL EASE INJECT PEN NEEDLES 32G X 4 MM MISC      ibuprofen  (ADVIL ) 800 MG tablet Take 1 tablet (800 mg total) by mouth every 8 (eight) hours as needed. 20 tablet 0   metroNIDAZOLE  (METROGEL ) 1 % gel Apply topically daily. 45 g 0    tranexamic acid  (LYSTEDA ) 650 MG TABS tablet TAKE TWO TABLETS BY MOUTH THREE TIMES A DAY . TAKE DURING MENSES FOR A MAXIMUM OF FIVE DAYS 30 tablet 5   insulin  glargine-yfgn (SEMGLEE ) 100 UNIT/ML Pen INJECT 38 UNITS INTO THE SKIN TWO (TWO) TIMES DAILY 15 mL 0   lisinopril  (ZESTRIL ) 5 MG tablet Take 1 tablet (5 mg total) by mouth daily. 60 tablet 1   OZEMPIC , 1 MG/DOSE, 4 MG/3ML SOPN INJECT 1 MG AS DIRECTED ONCE A WEEK 3 mL 0   Continuous Glucose Sensor (FREESTYLE LIBRE 3 SENSOR) MISC Place 1 sensor on the skin every 14 days. Use to check glucose continuously (Patient not taking: Reported on 12/22/2023) 2 each 3   cyclobenzaprine  (FLEXERIL ) 10 MG tablet Take 1 tablet by mouth 3 times daily as needed for muscle spasm. Warning: May cause drowsiness. (Patient not taking: Reported on 03/02/2023) 21 tablet 0   glucose blood (ACCU-CHEK GUIDE) test strip USE AS INSTRUCTED (Patient not taking: Reported on 12/22/2023) 100 each 10   glucose blood test strip Check your sugar in the morning before you eat breakfast, and one hour after a meal. (Patient not taking: Reported on 12/22/2023) 100 each 2   phenazopyridine  (PYRIDIUM ) 95 MG tablet Take 1 tablet (95 mg total) by mouth 3 (three) times daily as needed for pain. (Patient not taking: Reported on 12/22/2023) 10 tablet 0   RESTASIS 0.05 % ophthalmic emulsion  (Patient not taking: Reported on 12/22/2023)     No facility-administered medications prior to visit.    No Known Allergies  ROS Review of Systems  Constitutional:  Negative for appetite change, chills, fatigue and fever.  HENT:  Negative for congestion, postnasal drip, rhinorrhea and sneezing.   Respiratory:  Negative for cough, shortness of breath and wheezing.   Cardiovascular:  Negative for chest pain, palpitations and leg swelling.  Gastrointestinal:  Negative for abdominal pain, constipation, nausea and vomiting.  Genitourinary:  Negative for difficulty urinating, dysuria, flank pain and frequency.   Musculoskeletal:  Negative for gait problem, joint swelling and myalgias.  Skin:  Negative for color change, pallor, rash and wound.  Neurological:  Negative for dizziness, facial asymmetry, weakness, numbness and headaches.  Psychiatric/Behavioral:  Negative for behavioral problems, confusion, self-injury and suicidal ideas.       Objective:    Physical Exam Vitals and nursing note reviewed.  Constitutional:      General: She is not in acute distress.    Appearance: Normal appearance. She is obese. She is not ill-appearing, toxic-appearing or diaphoretic.  Eyes:     General: No scleral icterus.       Right eye: No discharge.        Left eye: No  discharge.     Extraocular Movements: Extraocular movements intact.     Conjunctiva/sclera: Conjunctivae normal.  Cardiovascular:     Rate and Rhythm: Normal rate and regular rhythm.     Pulses: Normal pulses.     Heart sounds: Normal heart sounds. No murmur heard.    No friction rub. No gallop.  Pulmonary:     Effort: Pulmonary effort is normal. No respiratory distress.     Breath sounds: Normal breath sounds. No stridor. No wheezing, rhonchi or rales.  Chest:     Chest wall: No tenderness.  Abdominal:     General: There is no distension.     Palpations: Abdomen is soft.     Tenderness: There is no abdominal tenderness. There is no right CVA tenderness, left CVA tenderness or guarding.  Musculoskeletal:        General: No swelling, tenderness, deformity or signs of injury.     Right lower leg: No edema.     Left lower leg: No edema.  Skin:    General: Skin is warm and dry.     Capillary Refill: Capillary refill takes less than 2 seconds.     Coloration: Skin is not jaundiced or pale.     Findings: No bruising, erythema or lesion.  Neurological:     Mental Status: She is alert and oriented to person, place, and time.     Motor: No weakness.     Coordination: Coordination normal.     Gait: Gait normal.  Psychiatric:         Mood and Affect: Mood normal.        Behavior: Behavior normal.        Thought Content: Thought content normal.        Judgment: Judgment normal.     BP 130/81   Pulse 79   Temp (!) 97 F (36.1 C)   Wt 282 lb (127.9 kg)   SpO2 100%   BMI 44.17 kg/m  Wt Readings from Last 3 Encounters:  12/22/23 282 lb (127.9 kg)  09/22/23 297 lb (134.7 kg)  06/23/23 287 lb (130.2 kg)    Lab Results  Component Value Date   TSH 1.105 06/17/2019   Lab Results  Component Value Date   WBC 3.8 (L) 04/01/2023   HGB 10.4 (L) 04/01/2023   HCT 34.1 (L) 04/01/2023   MCV 78.2 (L) 04/01/2023   PLT 161 04/01/2023   Lab Results  Component Value Date   NA 135 09/22/2023   K 4.0 09/22/2023   CO2 21 09/22/2023   GLUCOSE 296 (H) 09/22/2023   BUN 9 09/22/2023   CREATININE 0.75 09/22/2023   BILITOT 0.4 04/01/2023   ALKPHOS 111 04/01/2023   AST 21 04/01/2023   ALT 15 04/01/2023   PROT 7.7 04/01/2023   ALBUMIN 3.1 (L) 04/01/2023   CALCIUM  9.0 09/22/2023   ANIONGAP 4 (L) 04/01/2023   EGFR 100 09/22/2023   Lab Results  Component Value Date   CHOL 128 09/22/2023   Lab Results  Component Value Date   HDL 47 09/22/2023   Lab Results  Component Value Date   LDLCALC 61 09/22/2023   Lab Results  Component Value Date   TRIG 109 09/22/2023   Lab Results  Component Value Date   CHOLHDL 2.7 09/22/2023   Lab Results  Component Value Date   HGBA1C 11.2 (A) 12/22/2023      Assessment & Plan:   Problem List Items Addressed This Visit  Cardiovascular and Mediastinum   Primary hypertension   Lisinopril  5 mg daily refilled DASH diet and commitment to daily physical activity for a minimum of 30 minutes discussed and encouraged, as a part of hypertension management. The importance of attaining a healthy weight is also discussed. BMP at next visit     12/22/2023    9:08 AM 09/22/2023    9:34 AM 09/14/2023    8:55 AM 06/23/2023   12:42 PM 06/23/2023   10:05 AM 06/23/2023   10:00 AM  06/18/2023   10:20 AM  BP/Weight  Systolic BP 130 121 133 140  154 128  Diastolic BP 81 73 90 96  98 83  Wt. (Lbs) 282 297   287  287  BMI 44.17 kg/m2 46.52 kg/m2   44.95 kg/m2  44.95 kg/m2           Relevant Medications   lisinopril  (ZESTRIL ) 5 MG tablet     Endocrine   Microalbuminuria due to type 2 diabetes mellitus (HCC)   Encouraged to take lisinopril  5 mg daily as ordered Medication refilled Recent urine microalbumin was normal Lab Results  Component Value Date   LABMICR <3.0 09/22/2023   LABMICR 5.7 11/24/2022   MICROALBUR 0.84 02/27/2014   MICROALBUR 0.76 04/27/2012           Relevant Medications   Semaglutide , 2 MG/DOSE, 8 MG/3ML SOPN   lisinopril  (ZESTRIL ) 5 MG tablet   insulin  glargine-yfgn (SEMGLEE ) 100 UNIT/ML Pen   Uncontrolled type 2 diabetes mellitus with hyperglycemia (HCC) - Primary   Lab Results  Component Value Date   HGBA1C 11.2 (A) 12/22/2023  Chronic uncontrolled condition Increase Ozempic  to 2 mg once weekly, take Semglee  38 units twice daily She is not interested in taking pills, patient referred to the clinical pharmacist and endocrinologist.  Referral for diabetic nutrition education placed, encouraged to get diabetes eye exam completed Patient counseled on low-carb diet CBG goals discussed Encouraged to engage in regular moderate exercise at least  150 minutes weekly as tolerated Need to get diabetes under control to prevent complications discussed Stated that her pharmacy did not give her freestyle libre, freestyle libre reordered today, the  nurse has called the pharmacy relating to this  Follow-up in 3 months      Relevant Medications   Semaglutide , 2 MG/DOSE, 8 MG/3ML SOPN   lisinopril  (ZESTRIL ) 5 MG tablet   insulin  glargine-yfgn (SEMGLEE ) 100 UNIT/ML Pen   Other Relevant Orders   POCT glycosylated hemoglobin (Hb A1C) (Completed)   AMB Referral VBCI Care Management   Ambulatory referral to Endocrinology   Ambulatory referral to  diabetic education     Other   Human immunodeficiency virus (HIV) disease (HCC) (Chronic)   Continue Genvoya  daily Maintain close follow-up with infectious disease specialist      Morbid obesity with BMI of 45.0-49.9, adult (HCC)   Wt Readings from Last 3 Encounters:  12/22/23 282 lb (127.9 kg)  09/22/23 297 lb (134.7 kg)  06/23/23 287 lb (130.2 kg)   Body mass index is 44.17 kg/m.  Patient counseled on low-carb diet Encouraged to engage in regular moderate exercise at least 150 minutes weekly as tolerated Increasing Ozempic  to 2 mg once weekly      Relevant Medications   Semaglutide , 2 MG/DOSE, 8 MG/3ML SOPN   insulin  glargine-yfgn (SEMGLEE ) 100 UNIT/ML Pen   Other Visit Diagnoses       Screening for colon cancer       Relevant Orders   Cologuard  Meds ordered this encounter  Medications   Semaglutide , 2 MG/DOSE, 8 MG/3ML SOPN    Sig: Inject 2 mg as directed once a week.    Dispense:  3 mL    Refill:  3   lisinopril  (ZESTRIL ) 5 MG tablet    Sig: Take 1 tablet (5 mg total) by mouth daily.    Dispense:  90 tablet    Refill:  1   insulin  glargine-yfgn (SEMGLEE ) 100 UNIT/ML Pen    Sig: Inject 38 Units into the skin in the morning and at bedtime.    Dispense:  15 mL    Refill:  4    Follow-up: Return in about 3 months (around 03/23/2024) for DM.    Emireth Cockerham R Jackline Castilla, FNP

## 2023-12-24 ENCOUNTER — Ambulatory Visit (INDEPENDENT_AMBULATORY_CARE_PROVIDER_SITE_OTHER): Admitting: Infectious Diseases

## 2023-12-24 ENCOUNTER — Other Ambulatory Visit: Payer: Self-pay

## 2023-12-24 VITALS — BP 129/86 | HR 81 | Temp 98.0°F | Wt 289.0 lb

## 2023-12-24 DIAGNOSIS — E559 Vitamin D deficiency, unspecified: Secondary | ICD-10-CM | POA: Diagnosis not present

## 2023-12-24 DIAGNOSIS — E119 Type 2 diabetes mellitus without complications: Secondary | ICD-10-CM | POA: Diagnosis not present

## 2023-12-24 DIAGNOSIS — Z6841 Body Mass Index (BMI) 40.0 and over, adult: Secondary | ICD-10-CM

## 2023-12-24 DIAGNOSIS — Z5181 Encounter for therapeutic drug level monitoring: Secondary | ICD-10-CM

## 2023-12-24 DIAGNOSIS — Z794 Long term (current) use of insulin: Secondary | ICD-10-CM | POA: Diagnosis not present

## 2023-12-24 DIAGNOSIS — I10 Essential (primary) hypertension: Secondary | ICD-10-CM | POA: Diagnosis not present

## 2023-12-24 DIAGNOSIS — B20 Human immunodeficiency virus [HIV] disease: Secondary | ICD-10-CM

## 2023-12-24 DIAGNOSIS — Z0000 Encounter for general adult medical examination without abnormal findings: Secondary | ICD-10-CM | POA: Insufficient documentation

## 2023-12-24 DIAGNOSIS — Z113 Encounter for screening for infections with a predominantly sexual mode of transmission: Secondary | ICD-10-CM

## 2023-12-24 DIAGNOSIS — Z7185 Encounter for immunization safety counseling: Secondary | ICD-10-CM

## 2023-12-24 MED ORDER — GENVOYA 150-150-200-10 MG PO TABS: 1.0000 | ORAL_TABLET | Freq: Every day | ORAL | 5 refills | Status: DC

## 2023-12-24 NOTE — Progress Notes (Signed)
 [address], [place], [place], [address]                                                                  Phn. [phone]; Fax: [phone]                                                                             Date: 12/24/23  Reason for Visit: Routine HIV care.   HPI: Beverly Johnson is a 48 y.o.old female with a history of HIV, DM2 w neuropathy , HTN,Morbid Obesity  Vit D deficiency who is here for HIV follow up.    She reports consistently taking Genvoya  for several years without missed doses. 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 Her weight fluctuates slightly, and she is attempting dietary changes to manage it. She denies smoking, alcohol, or drug use. She has been with the same partner for fourteen years. Her overall health is stable, with no mental health issues reported. She is following with her PCP for management of DM as well as for cancer screenings. She has been referred to Endocrinology for DM management.  Declined vaccines. Follows with dentist. No complaints today.   ROS: As stated in above HPI; all other systems were reviewed and are otherwise negative unless noted below  No reported fever / chills, night sweats, unintentional weight loss, acute visual change, odynophagia, chest pain/pressure, new or worsened SOB or WOB, nausea, vomiting, diarrhea, dysuria, GU discharge, syncope, seizures, red/hot swollen joints, hallucinations / delusions, rashes, new allergies, unusual / excessive bleeding, swollen lymph nodes, or new hospitalizations since the pt was last seen.  PMH/ PSH/ FamHx / Social Hx , medications and allergies reviewed and updated as appropriate; please see corresponding tab in EHR / prior notes                                         Current Outpatient Medications on File Prior to Visit  Medication Sig Dispense Refill   Accu-Chek Softclix Lancets lancets 1 each 3 (three) times daily.     Continuous Glucose Sensor (FREESTYLE LIBRE 3 SENSOR) MISC Place 1 sensor on the skin every 14 days. Use to check glucose continuously 2 each 3   cyclobenzaprine  (FLEXERIL ) 10 MG tablet Take 1 tablet by mouth 3 times daily as needed for muscle spasm. Warning: May cause drowsiness. 21 tablet 0   GLOBAL EASE INJECT PEN NEEDLES 32G X 4 MM MISC  glucose blood (ACCU-CHEK GUIDE) test strip USE AS INSTRUCTED 100 each 10   glucose blood test strip Check your sugar in the morning before you eat breakfast, and one hour after a meal. 100 each 2   insulin  glargine-yfgn (SEMGLEE ) 100 UNIT/ML Pen Inject 38 Units into the skin in the morning and at bedtime. 15 mL 4   RESTASIS 0.05 % ophthalmic emulsion      Semaglutide , 2 MG/DOSE, 8 MG/3ML SOPN Inject 2 mg as directed once a week. 3 mL 3   tranexamic acid  (LYSTEDA ) 650 MG TABS tablet TAKE TWO TABLETS BY MOUTH THREE TIMES A DAY . TAKE DURING MENSES FOR A MAXIMUM OF FIVE DAYS 30 tablet 5   ibuprofen  (ADVIL ) 800 MG tablet Take 1 tablet (800 mg total) by mouth every 8 (eight) hours as needed. (Patient not taking: Reported on 12/24/2023) 20 tablet 0   lisinopril  (ZESTRIL ) 5 MG tablet Take 1 tablet (5 mg total) by mouth daily. (Patient not taking: Reported on 12/24/2023) 90 tablet 1   metroNIDAZOLE  (METROGEL ) 1 % gel Apply topically daily. (Patient not taking: Reported on 12/24/2023) 45 g 0   phenazopyridine  (PYRIDIUM ) 95 MG tablet Take 1 tablet (95 mg total) by mouth 3 (three) times daily as needed for pain. (Patient not taking: Reported on 12/24/2023) 10 tablet 0   [DISCONTINUED] cetirizine  (ZYRTEC  ALLERGY) 10 MG tablet Take 1 tablet (10 mg total) by mouth daily. (Patient not taking: Reported on 05/27/2020) 30 tablet 0   No current facility-administered medications on file prior to visit.    No Known Allergies  Past Medical History:  Diagnosis Date   Back pain    DM2 (diabetes mellitus, type 2) (HCC)    HIV infection (HCC)    Human immunodeficiency virus (HIV) disease (HCC) 09/29/2006   Qualifier: Diagnosis of   By: Ferrell Hu MD, Kelly         Menorrhagia 11/05/2022   Microalbuminuria due to type 2 diabetes mellitus (HCC)    Obesity 04/27/2012   Current weight 279lb 04/27/12     Primary hypertension 03/02/2023   Uncontrolled type 2 diabetes mellitus with hyperglycemia (HCC) 03/02/2023   Vitamin D  deficiency 03/21/2017   Past Surgical History:  Procedure Laterality Date   CESAREAN SECTION     x3   CHOLECYSTECTOMY  06/29/2011   Procedure: LAPAROSCOPIC CHOLECYSTECTOMY WITH INTRAOPERATIVE CHOLANGIOGRAM;  Surgeon: Mayme Spearman, MD;  Location: [facility] OR;  Service: General;  Laterality: N/A;   TUBAL LIGATION     Social History   Socioeconomic History   Marital status: Married    Spouse name: Not on file   Number of children: Not on file   Years of education: 10   Highest education level: Not on file  Occupational History    Employer: UNEMPLOYED  Tobacco Use   Smoking status: Never   Smokeless tobacco: Never  Vaping Use   Vaping status: Never Used  Substance and Sexual Activity   Alcohol use: No   Drug use: Not Currently    Types: Marijuana    Comment: CBD   Sexual activity: Yes    Partners: Male    Birth control/protection: None  Other Topics Concern   Not on file  Social History Narrative   Not on file   Social Drivers of Health   Financial Resource Strain: Not on file  Food Insecurity: No Food Insecurity (02/24/2021)   Hunger Vital Sign    Worried About Running Out of Food in the Last Year: Never true  Ran Out of Food in the Last Year: Never true  Transportation Needs: No Transportation Needs (02/24/2021)   PRAPARE - Administrator, Civil Service (Medical): No    Lack of Transportation (Non-Medical): No  Physical Activity: Not on file   Stress: Not on file  Social Connections: Not on file  Intimate Partner Violence: Not on file   Family History  Problem Relation Age of Onset   Healthy Mother    Healthy Father    Breast cancer Neg Hx    Vitals  BP 129/86   Pulse 81   Temp 98 F (36.7 C) (Oral)   Wt 289 lb (131.1 kg)   LMP 11/22/2023 (Approximate)   SpO2 98%   BMI 45.26 kg/m   Examination  Gen: no acute distress. morbidly obese  HEENT: [place]/AT, no scleral icterus, no pale conjunctivae, hearing normal, oral mucosa moist Neck: Supple Cardio: Regular rate and rhythm. S1S2 Resp: Pulmonary effort normal in room air, normal breath sounds  GI: nondistended, non tender and soft GU: Musc: Extremities: No pedal edema Skin: No rashes Neuro: grossly non focal , awake, alert and oriented * 3  Psych: Calm, cooperative Lab Results HIV 1 RNA Quant  Date Value  06/18/2023 24 Copies/mL (H)  10/28/2022 <20 Copies/mL (H)  04/30/2022 <20 DETECTED copies/mL (A)   CD4 T Cell Abs (/uL)  Date Value  10/28/2022 577  11/25/2021 489  10/21/2021 283 (L)   No results found for: "HIV1GENOSEQ" Lab Results  Component Value Date   WBC 3.8 (L) 04/01/2023   HGB 10.4 (L) 04/01/2023   HCT 34.1 (L) 04/01/2023   MCV 78.2 (L) 04/01/2023   PLT 161 04/01/2023    Lab Results  Component Value Date   CREATININE 0.75 09/22/2023   BUN 9 09/22/2023   NA 135 09/22/2023   K 4.0 09/22/2023   CL 100 09/22/2023   CO2 21 09/22/2023   Lab Results  Component Value Date   ALT 15 04/01/2023   AST 21 04/01/2023   ALKPHOS 111 04/01/2023   BILITOT 0.4 04/01/2023    Lab Results  Component Value Date   CHOL 128 09/22/2023   TRIG 109 09/22/2023   HDL 47 09/22/2023   LDLCALC 61 09/22/2023   No results found for: "HAV" Lab Results  Component Value Date   HEPBSAG NO 10/11/2006   HEPBSAB NO 10/11/2006   Lab Results  Component Value Date   HCVAB NO 10/11/2006   Lab Results  Component Value Date   CHLAMYDIAWP Negative 09/25/2022    N Negative 09/25/2022   No results found for: "GCPROBEAPT" No results found for: "QUANTGOLD"  Health Maintenance: Immunization History  Administered Date(s) Administered   H1N1 08/22/2008   Hepatitis A 07/19/2006, 12/08/2010   Hepatitis B 07/19/2006, 09/29/2006, 04/13/2007   Influenza Split 09/11/2011   Influenza Whole 07/19/2006, 09/29/2006, 07/26/2007, 08/22/2008   PFIZER(Purple Top)SARS-COV-2 Vaccination 04/09/2020, 04/30/2020   Pneumococcal Conjugate-13 11/26/2020   Pneumococcal Polysaccharide-23 09/29/2006, 09/11/2011    Assessment/Plan: # HIV - continue genoya as is, refills sent - labs today  - fu in 6 months   # Morbid Obesity/HTN/DM/Vit D def  - Per PCP - Discussed about healthy diet and regular exercise.   # STD screening  - no acute concerns  - Urine GC and RPR     # Immunization  - Declined today  # Health maintenance - Lipid panel today - Follows with dentist  - Discussed to fu with PCP for being uptodate on colon  ca, breast ca and cervical ca screenings  Patient's labs were reviewed as well as his previous records. Patients questions were addressed and answered. Safe sex counseling done.  I have personally spent 40 minutes involved in face-to-face and non-face-to-face activities for this patient on the day of the visit. Professional time spent includes the following activities: Preparing to see the patient (review of tests), Obtaining and/or reviewing separately obtained history (admission/discharge record), Performing a medically appropriate examination and/or evaluation , Ordering medications/tests/procedures, referring and communicating with other health care professionals, Documenting clinical information in the EMR, Independently interpreting results (not separately reported), Communicating results to the patient/family/caregiver, Counseling and educating the patient/family/caregiver and Care coordination (not separately reported).   Of note,  portions of this note may have been created with voice recognition software. While this note has been edited for accuracy, occasional wrong-word or 'sound-a-like' substitutions may have occurred due to the inherent limitations of voice recognition software.   Electronically signed by:  Terre Ferri, MD Infectious Disease Physician [facility] [address] [place], [place] [address] Phone: [phone]  Fax: [phone]

## 2023-12-25 LAB — C. TRACHOMATIS/N. GONORRHOEAE RNA
C. trachomatis RNA, TMA: NOT DETECTED
N. gonorrhoeae RNA, TMA: NOT DETECTED

## 2023-12-27 ENCOUNTER — Ambulatory Visit (HOSPITAL_COMMUNITY)
Admission: EM | Admit: 2023-12-27 | Discharge: 2023-12-27 | Disposition: A | Discharge status: Home / Self Care | Attending: Physician Assistant | Admitting: Physician Assistant

## 2023-12-27 ENCOUNTER — Encounter (HOSPITAL_COMMUNITY): Payer: Self-pay

## 2023-12-27 ENCOUNTER — Ambulatory Visit (INDEPENDENT_AMBULATORY_CARE_PROVIDER_SITE_OTHER)

## 2023-12-27 DIAGNOSIS — M1711 Unilateral primary osteoarthritis, right knee: Secondary | ICD-10-CM | POA: Diagnosis not present

## 2023-12-27 DIAGNOSIS — M25561 Pain in right knee: Secondary | ICD-10-CM

## 2023-12-27 MED ORDER — CYCLOBENZAPRINE HCL 10 MG PO TABS: 10.0000 mg | ORAL_TABLET | Freq: Every day | ORAL | 0 refills | Status: AC

## 2023-12-27 MED ORDER — IBUPROFEN 800 MG PO TABS
ORAL_TABLET | ORAL | Status: AC
  Filled 2023-12-27: qty 1

## 2023-12-27 MED ORDER — IBUPROFEN 800 MG PO TABS: 800.0000 mg | ORAL_TABLET | Freq: Three times a day (TID) | ORAL | 0 refills | Status: DC

## 2023-12-27 MED ORDER — IBUPROFEN 800 MG PO TABS
800.0000 mg | ORAL_TABLET | Freq: Once | ORAL | Status: AC
  Administered 2023-12-27: 800 mg via ORAL

## 2023-12-27 NOTE — ED Triage Notes (Signed)
 Patient reports that she has had right knee pain x 1 month.  Patient has had Ibuprofen  and Tylenol  with very little relief.

## 2023-12-27 NOTE — Discharge Instructions (Addendum)
 Ibuprofen  can be used every 6 hours for pain and swelling. Try for the next 3-4 days continually. The muscle relaxer Flexeril  can be used as night (might make you drowsy!)  Rest - try to avoid high impact activity Ice - apply for 20 minutes a few times daily Compression - use knee brace with walking/standing  Elevation - prop up on a pillow  Please follow up with orthopedics. They have walk-in hours Mon-Fri starting at 8 am

## 2023-12-27 NOTE — ED Provider Notes (Signed)
 [facility]    CSN: [id number] Arrival date & time: 12/27/23  1628      History   Chief Complaint Chief Complaint  Patient presents with   Knee Pain    HPI Beverly Johnson is a 48 y.o. female.  Here with 1-2 month history of right knee pain, intermittent Denies known injury, trauma, or falls Pain is rated 9/10 currently Has used ibuprofen  and tylenol  a few times, none recently  History of HIV, T2DM, chronic back pain Last A1c was 5 days ago - 11.2  Past Medical History:  Diagnosis Date   Back pain    DM2 (diabetes mellitus, type 2) (HCC)    HIV infection (HCC)    Human immunodeficiency virus (HIV) disease (HCC) 09/29/2006   Qualifier: Diagnosis of   By: Ferrell Hu MD, Kelly         Menorrhagia 11/05/2022   Microalbuminuria due to type 2 diabetes mellitus (HCC)    Obesity 04/27/2012   Current weight 279lb 04/27/12     Primary hypertension 03/02/2023   Uncontrolled type 2 diabetes mellitus with hyperglycemia (HCC) 03/02/2023   Vitamin D  deficiency 03/21/2017    Patient Active Problem List   Diagnosis Date Noted   Health care maintenance 12/24/2023   Immunization counseling 12/24/2023   Vaginal candida 09/22/2023   BV (bacterial vaginosis) 09/22/2023   Uncontrolled type 2 diabetes mellitus with hyperglycemia (HCC) 03/02/2023   Primary hypertension 03/02/2023   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   Bilateral leg edema 11/24/2022   Lymphadenopathy, abdominal 11/24/2022   Acute low back pain without sciatica 11/11/2022   Menorrhagia 11/05/2022   Anemia 03/09/2022   Leukopenia 11/25/2021   Need for prophylactic vaccination and inoculation against influenza 05/28/2021   Adenomyosis 02/24/2021   Vulvovaginal candidiasis 01/16/2021   Dysmenorrhea 01/16/2021   Callus of foot 11/26/2020   Heavy menses 11/26/2020   History of 2019 novel coronavirus disease (COVID-19) 06/16/2019   Abnormal ECG 06/16/2019   Medication monitoring  encounter 11/12/2017   Breast nodule 09/02/2017   Screening examination for venereal disease 03/30/2017   Encounter for long-term (current) use of high-risk medication 03/30/2017   Microalbuminuria due to type 2 diabetes mellitus (HCC) 03/30/2017   Knee pain, chronic 03/30/2017   Chest wall pain 03/21/2017   Vitamin D  deficiency 03/21/2017   Healthcare maintenance 03/02/2014   Morbid obesity with BMI of 45.0-49.9, adult (HCC) 04/27/2012   Type 2 diabetes mellitus without complications (HCC) 12/08/2010   Human immunodeficiency virus (HIV) disease (HCC) 09/29/2006    Past Surgical History:  Procedure Laterality Date   CESAREAN SECTION     x3   CHOLECYSTECTOMY  06/29/2011   Procedure: LAPAROSCOPIC CHOLECYSTECTOMY WITH INTRAOPERATIVE CHOLANGIOGRAM;  Surgeon: Mayme Spearman, MD;  Location: [facility];  Service: General;  Laterality: N/A;   TUBAL LIGATION      OB History     Gravida  3   Para  3   Term  3   Preterm      AB      Living  3      SAB      IAB      Ectopic      Multiple      Live Births  3        Obstetric Comments  c-section x 3. Last one with BTL done in the mid 2000s          Home Medications    Prior to  Admission medications   Medication Sig Start Date End Date Taking? Authorizing Provider  cyclobenzaprine  (FLEXERIL ) 10 MG tablet Take 1 tablet (10 mg total) by mouth at bedtime. 12/27/23  Yes Cashe Gatt, Ivette Marks, PA-C  ibuprofen  (ADVIL ) 800 MG tablet Take 1 tablet (800 mg total) by mouth 3 (three) times daily. 12/27/23  Yes Dayan Kreis, Ivette Marks, PA-C  Accu-Chek Softclix Lancets lancets 1 each 3 (three) times daily. 02/06/21   [provider]  Continuous Glucose Sensor (FREESTYLE LIBRE 3 SENSOR) MISC Place 1 sensor on the skin every 14 days. Use to check glucose continuously 09/22/23   [name], [name], FNP  elvitegravir-cobicistat-emtricitabine -tenofovir  (GENVOYA ) 150-150-200-10 MG TABS tablet Take 1 tablet by mouth daily with breakfast. 12/24/23    Terre Ferri, MD  GLOBAL EASE INJECT PEN NEEDLES 32G X 4 MM MISC  11/15/21   [provider]  glucose blood (ACCU-CHEK GUIDE) test strip USE AS INSTRUCTED 03/08/23   [name], [name], FNP  glucose blood test strip Check your sugar in the morning before you eat breakfast, and one hour after a meal. 05/26/19   Ethlyn Herd, MD  insulin  glargine-yfgn (SEMGLEE ) 100 UNIT/ML Pen Inject 38 Units into the skin in the morning and at bedtime. 12/22/23   [name], [name], FNP  RESTASIS 0.05 % ophthalmic emulsion  11/06/22   [provider]  Semaglutide , 2 MG/DOSE, 8 MG/3ML SOPN Inject 2 mg as directed once a week. 12/22/23   [name], [name], FNP  tranexamic acid  (LYSTEDA ) 650 MG TABS tablet TAKE TWO TABLETS BY MOUTH THREE TIMES A DAY . TAKE DURING MENSES FOR A MAXIMUM OF FIVE DAYS 10/06/23   Abigail Abler, MD  cetirizine  (ZYRTEC  ALLERGY) 10 MG tablet Take 1 tablet (10 mg total) by mouth daily. Patient not taking: Reported on 05/27/2020 11/26/19 11/08/20  Henderly, Britni A, PA-C    Family History Family History  Problem Relation Age of Onset   Healthy Mother    Healthy Father    Breast cancer Neg Hx     Social History Social History   Tobacco Use   Smoking status: Never   Smokeless tobacco: Never  Vaping Use   Vaping status: Never Used  Substance Use Topics   Alcohol use: No   Drug use: Not Currently    Types: Marijuana    Comment: CBD     Allergies   Patient has no known allergies.   Review of Systems Review of Systems Per HPI  Physical Exam Triage Vital Signs ED Triage Vitals [12/27/23 1731]  Encounter Vitals Group     BP 121/81     Systolic BP Percentile      Diastolic BP Percentile      Pulse Rate 92     Resp 16     Temp 99 F (37.2 C)     Temp Source Oral     SpO2 98 %     Weight      Height      Head Circumference      Peak Flow      Pain Score 9     Pain Loc      Pain Education      Exclude from Growth Chart    No data  found.  Updated Vital Signs BP 121/81 (BP Location: Right Arm)   Pulse 92   Temp 99 F (37.2 C) (Oral)   Resp 16   LMP 11/22/2023 (Approximate)   SpO2 98%    Physical Exam Vitals and nursing note  reviewed.  Constitutional:      General: She is not in acute distress. HENT:     Mouth/Throat:     Pharynx: Oropharynx is clear.  Cardiovascular:     Rate and Rhythm: Normal rate and regular rhythm.     Pulses: Normal pulses.     Heart sounds: Normal heart sounds.  Pulmonary:     Effort: Pulmonary effort is normal.     Breath sounds: Normal breath sounds.  Musculoskeletal:     Cervical back: Normal range of motion.     Right knee: Swelling (mild) present. Decreased range of motion. Tenderness present. Normal pulse.     Comments: Generalized tenderness of the right knee, decreased ROM due to pain. Strength and sensation intact. No erythema or warmth.   Skin:    Capillary Refill: Capillary refill takes less than 2 seconds.  Neurological:     Mental Status: She is alert and oriented to person, place, and time.     UC Treatments / Results  Labs (all labs ordered are listed, but only abnormal results are displayed) Labs Reviewed - No data to display  EKG  Radiology DG Knee Complete 4 Views Right Result Date: 12/27/2023 CLINICAL DATA:  Right knee pain for 1 month. EXAM: RIGHT KNEE - COMPLETE 4+ VIEW COMPARISON:  Right knee radiographs 04/24/2021 in a FINDINGS: Mild medial current joint space narrowing and peripheral osteophytosis. The lateral compartment joint space is maintained. Moderate chronic enthesopathic change at the quadriceps insertion on the patella. Mild superior patellar degenerative spurring. No joint effusion. No acute fracture dislocation. IMPRESSION: Mild medial and patellofemoral compartment osteoarthritis. Electronically Signed   By: Bertina Broccoli M.D.   On: 12/27/2023 18:52    Procedures Procedures (including critical care time)  Medications Ordered in  UC Medications  ibuprofen  (ADVIL ) tablet 800 mg (800 mg Oral Given 12/27/23 1832)    Initial Impression / Assessment and Plan / UC Course  I have reviewed the triage vital signs and the nursing notes.  Pertinent labs & imaging results that were available during my care of the patient were reviewed by me and considered in my medical decision making (see chart for details).  Right knee xray with mild osteoarthritis - medial and patellofemoral compartments. Images independently reviewed by me, agree with radiology interpretation.  Offered IM toradol , patient declines, would like oral medicine instead. Ibuprofen  800 mg given. Sent to pharmacy to use q6 hours prn for 3-4 days, short course as she is on HIV antivirals. Can also try flexeril  at night. Knee brace is provided, discussed RICE therapy and close follow up with orthopedics. Note for work is provided Patient agrees to plan  Final Clinical Impressions(s) / UC Diagnoses   Final diagnoses:  Right knee pain, unspecified chronicity     Discharge Instructions      Ibuprofen  can be used every 6 hours for pain and swelling. Try for the next 3-4 days continually. The muscle relaxer Flexeril  can be used as night (might make you drowsy!)  Rest - try to avoid high impact activity Ice - apply for 20 minutes a few times daily Compression - use knee brace with walking/standing  Elevation - prop up on a pillow  Please follow up with orthopedics. They have walk-in hours Mon-Fri starting at 8 am   ED Prescriptions     Medication Sig Dispense Auth. Provider   ibuprofen  (ADVIL ) 800 MG tablet Take 1 tablet (800 mg total) by mouth 3 (three) times daily. 15 tablet Sajid Ruppert, Ivette Marks,  PA-C   cyclobenzaprine  (FLEXERIL ) 10 MG tablet Take 1 tablet (10 mg total) by mouth at bedtime. 20 tablet Tarisa Paola, Ivette Marks, PA-C      PDMP not reviewed this encounter.   Lashya Passe, Beth Brooke 12/27/23 2046

## 2023-12-28 ENCOUNTER — Other Ambulatory Visit: Payer: Self-pay | Admitting: Infectious Diseases

## 2023-12-28 ENCOUNTER — Ambulatory Visit: Payer: Self-pay

## 2023-12-28 LAB — COMPREHENSIVE METABOLIC PANEL WITH GFR
AG Ratio: 0.9 (calc) — ABNORMAL LOW (ref 1.0–2.5)
ALT: 13 U/L (ref 6–29)
AST: 19 U/L (ref 10–35)
Albumin: 3.5 g/dL — ABNORMAL LOW (ref 3.6–5.1)
Alkaline phosphatase (APISO): 92 U/L (ref 31–125)
BUN: 11 mg/dL (ref 7–25)
CO2: 26 mmol/L (ref 20–32)
Calcium: 8.7 mg/dL (ref 8.6–10.2)
Chloride: 107 mmol/L (ref 98–110)
Creat: 0.63 mg/dL (ref 0.50–0.99)
Globulin: 3.7 g/dL (ref 1.9–3.7)
Glucose, Bld: 151 mg/dL — ABNORMAL HIGH (ref 65–99)
Potassium: 4 mmol/L (ref 3.5–5.3)
Sodium: 137 mmol/L (ref 135–146)
Total Bilirubin: 0.3 mg/dL (ref 0.2–1.2)
Total Protein: 7.2 g/dL (ref 6.1–8.1)
eGFR: 111 mL/min/{1.73_m2} (ref >=60)

## 2023-12-28 LAB — LIPID PANEL
Cholesterol: 130 mg/dL (ref <200)
HDL: 52 mg/dL (ref >=50)
LDL Cholesterol (Calc): 61 mg/dL
Non-HDL Cholesterol (Calc): 78 mg/dL (ref <130)
Total CHOL/HDL Ratio: 2.5 (calc) (ref <5.0)
Triglycerides: 83 mg/dL (ref <150)

## 2023-12-28 LAB — CBC
HCT: 32.8 % — ABNORMAL LOW (ref 35.0–45.0)
Hemoglobin: 10.1 g/dL — ABNORMAL LOW (ref 11.7–15.5)
MCH: 23.1 pg — ABNORMAL LOW (ref 27.0–33.0)
MCHC: 30.8 g/dL — ABNORMAL LOW (ref 32.0–36.0)
MCV: 74.9 fL — ABNORMAL LOW (ref 80.0–100.0)
MPV: 11.3 fL (ref 7.5–12.5)
Platelets: 178 10*3/uL (ref 140–400)
RBC: 4.38 10*6/uL (ref 3.80–5.10)
RDW: 13.9 % (ref 11.0–15.0)
WBC: 3.6 10*3/uL — ABNORMAL LOW (ref 3.8–10.8)

## 2023-12-28 LAB — T-HELPER CELLS (CD4) COUNT (NOT AT ARMC)
Absolute CD4: 656 {cells}/uL (ref 490–1740)
CD4 T Helper %: 38 % (ref 30–61)
Total lymphocyte count: 1714 {cells}/uL (ref 850–3900)

## 2023-12-28 LAB — HIV RNA, RTPCR W/R GT (RTI, PI,INT)
HIV 1 RNA Quant: 36 {copies}/mL — ABNORMAL HIGH
HIV-1 RNA Quant, Log: 1.56 {Log_copies}/mL — ABNORMAL HIGH

## 2023-12-28 LAB — RPR: RPR Ser Ql: NONREACTIVE

## 2023-12-28 MED ORDER — ATORVASTATIN CALCIUM 20 MG PO TABS
20.0000 mg | ORAL_TABLET | Freq: Every day | ORAL | 5 refills | Status: DC
Start: 2023-12-28 — End: 2024-04-12

## 2023-12-28 NOTE — Telephone Encounter (Signed)
-----   Message from Melvina Stage sent at 12/28/2023  6:46 AM EDT ----- Please let patient know labs without any significant abnormality. She remains virally suppressed.   She is high risk for cardiovascular events like MI, CVA with 10 years ASCVD score  approx 5% + has other risk factors like HIV and DM. I am sending her atorvastatin 20mg  po daily #30 with 5 refills.

## 2023-12-28 NOTE — Telephone Encounter (Signed)
 Attempted to contact patient on home and mobile number. Mobile number call couldn't be completed. Home number a female answered the phone and stated that patient wasn't home, I asked if he could ask her to cal her doctors office back.    Gianny Killman Roann Chestnut, CMA

## 2024-01-13 ENCOUNTER — Telehealth: Payer: Self-pay | Admitting: *Deleted

## 2024-01-13 NOTE — Progress Notes (Signed)
 Care Guide Pharmacy Note  01/13/2024 Name: Beverly Johnson MRN: [id number] DOB: 15-Jan-1976  Referred By: [name], [name], FNP Reason for referral: Complex Care Management (Initial outreach to schedule referral with PharmD)   Beverly Johnson is a 48 y.o. year old female who is a primary care patient of [name], [name], FNP.  Beverly Johnson was referred to the pharmacist for assistance related to: DMII  An unsuccessful telephone outreach was attempted today to contact the patient who was referred to the pharmacy team for assistance with medication management. Additional attempts will be made to contact the patient.  Barnie Bora  [facility]  [facility], [place] [facility] Guide  Direct Dial: [phone]  Fax [phone]

## 2024-01-14 NOTE — Progress Notes (Signed)
 Complex Care Management Note Care Guide Note  01/14/2024 Name: LICET DUNPHY MRN: [id number] DOB: Oct 18, 1975   Complex Care Management Outreach Attempts: A second unsuccessful outreach was attempted today to offer the patient with information about available complex care management services.  Follow Up Plan:  Additional outreach attempts will be made to offer the patient complex care management information and services.   Encounter Outcome:  No Answer  Barnie Bora  [facility]  [facility] Guide  Direct Dial: [phone]  Fax [phone]

## 2024-01-18 NOTE — Progress Notes (Signed)
 Care Guide Pharmacy Note  01/18/2024 Name: Beverly Johnson MRN: [id number] DOB: 1975-09-08  Referred By: [name], [name], FNP Reason for referral: Complex Care Management (Unsuccessful Initial outreach to schedule referral with PharmD)   Beverly Johnson is a 48 y.o. year old female who is a primary care patient of [name], [name], FNP.  Beverly Johnson was referred to the pharmacist for assistance related to: DMII  A third unsuccessful telephone outreach was attempted today to contact the patient who was referred to the pharmacy team for assistance with medication management. The Population Health team is pleased to engage with this patient at any time in the future upon receipt of referral and should he/she be interested in assistance from the Population Health team.  Barnie Bora  [facility]  [facility], [facility] Guide  Direct Dial: [phone]  Fax [phone]

## 2024-02-04 ENCOUNTER — Telehealth: Payer: Self-pay | Admitting: Nurse Practitioner

## 2024-02-04 NOTE — Telephone Encounter (Signed)
 Patient was identified as falling into the [place] Measure - Diabetes.   Patient was: Referred to pharmacy for chronic disease management.  Pharmacy has made 3 unsuccessful attempts to reach pt.

## 2024-02-28 ENCOUNTER — Telehealth: Payer: Self-pay | Admitting: Nurse Practitioner

## 2024-02-28 NOTE — Telephone Encounter (Signed)
 Patient was identified as falling into the True [place] Measure - Diabetes.   Patient was: Appointment already scheduled for:  03/24/2024.

## 2024-02-29 ENCOUNTER — Other Ambulatory Visit: Payer: Self-pay | Admitting: Nurse Practitioner

## 2024-02-29 DIAGNOSIS — E1165 Type 2 diabetes mellitus with hyperglycemia: Secondary | ICD-10-CM

## 2024-03-24 ENCOUNTER — Ambulatory Visit: Payer: Self-pay | Admitting: Nurse Practitioner

## 2024-03-28 ENCOUNTER — Other Ambulatory Visit: Payer: Self-pay | Admitting: Nurse Practitioner

## 2024-03-28 ENCOUNTER — Other Ambulatory Visit: Payer: Self-pay | Admitting: Obstetrics and Gynecology

## 2024-03-28 ENCOUNTER — Ambulatory Visit (INDEPENDENT_AMBULATORY_CARE_PROVIDER_SITE_OTHER): Payer: Self-pay | Admitting: Nurse Practitioner

## 2024-03-28 ENCOUNTER — Encounter: Payer: Self-pay | Admitting: Nurse Practitioner

## 2024-03-28 VITALS — BP 128/77 | HR 86 | Temp 97.5°F | Wt 284.0 lb

## 2024-03-28 DIAGNOSIS — M79642 Pain in left hand: Secondary | ICD-10-CM

## 2024-03-28 DIAGNOSIS — E1165 Type 2 diabetes mellitus with hyperglycemia: Secondary | ICD-10-CM

## 2024-03-28 DIAGNOSIS — R809 Proteinuria, unspecified: Secondary | ICD-10-CM

## 2024-03-28 DIAGNOSIS — M79641 Pain in right hand: Secondary | ICD-10-CM | POA: Diagnosis not present

## 2024-03-28 DIAGNOSIS — Z6841 Body Mass Index (BMI) 40.0 and over, adult: Secondary | ICD-10-CM

## 2024-03-28 DIAGNOSIS — E1129 Type 2 diabetes mellitus with other diabetic kidney complication: Secondary | ICD-10-CM

## 2024-03-28 DIAGNOSIS — Z1231 Encounter for screening mammogram for malignant neoplasm of breast: Secondary | ICD-10-CM | POA: Diagnosis not present

## 2024-03-28 DIAGNOSIS — N920 Excessive and frequent menstruation with regular cycle: Secondary | ICD-10-CM

## 2024-03-28 DIAGNOSIS — B20 Human immunodeficiency virus [HIV] disease: Secondary | ICD-10-CM | POA: Diagnosis not present

## 2024-03-28 LAB — POCT GLYCOSYLATED HEMOGLOBIN (HGB A1C): Hemoglobin A1C: 10.7 % — AB (ref 4.0–5.6)

## 2024-03-28 MED ORDER — SEMAGLUTIDE (2 MG/DOSE) 8 MG/3ML ~~LOC~~ SOPN: 2.0000 mg | PEN_INJECTOR | SUBCUTANEOUS | 3 refills | Status: DC

## 2024-03-28 MED ORDER — GABAPENTIN 100 MG PO CAPS: 100.0000 mg | ORAL_CAPSULE | Freq: Three times a day (TID) | ORAL | 3 refills | Status: DC

## 2024-03-28 MED ORDER — INSULIN GLARGINE-YFGN 100 UNIT/ML ~~LOC~~ SOPN: 40.0000 [IU] | PEN_INJECTOR | Freq: Two times a day (BID) | SUBCUTANEOUS | 4 refills | Status: DC

## 2024-03-28 NOTE — Assessment & Plan Note (Addendum)
 On Ozempic  2 mg once weekly Blood pressure is well-controlled without medication

## 2024-03-28 NOTE — Assessment & Plan Note (Signed)
 Wt Readings from Last 3 Encounters:  03/28/24 284 lb (128.8 kg)  12/24/23 289 lb (131.1 kg)  12/22/23 282 lb (127.9 kg)   Body mass index is 44.48 kg/m.  Patient counseled on low-carb diet, encouraged to engage in regular moderate vigorous exercises at least 150 minutes weekly as tolerated On Ozempic  for type 2 diabetes

## 2024-03-28 NOTE — Patient Instructions (Addendum)
 [facility] [address], [address] [place] [address] [phone]   Eye doctor  [address] [place] [address]  P:  [phone]   1. Uncontrolled type 2 diabetes mellitus with hyperglycemia (HCC) (Primary)  - POCT glycosylated hemoglobin (Hb A1C) - insulin  glargine-yfgn (SEMGLEE ) 100 UNIT/ML Pen; Inject 40 Units into the skin 2 (two) times daily.  Dispense: 15 mL; Refill: 4 - AMB Referral VBCI Care Management  2. Screening mammogram for breast cancer  - MM 3D SCREENING MAMMOGRAM BILATERAL BREAST; Future  3. Bilateral hand pain  - gabapentin  (NEURONTIN ) 100 MG capsule; Take 1 capsule (100 mg total) by mouth 3 (three) times daily.  Dispense: 90 capsule; Refill: 3     It is important that you exercise regularly at least 30 minutes 5 times a week as tolerated  Think about what you will eat, plan ahead. Choose  clean, green, fresh or frozen over canned, processed or packaged foods which are more sugary, salty and fatty. 70 to 75% of food eaten should be vegetables and fruit. Three meals at set times with snacks allowed between meals, but they must be fruit or vegetables. Aim to eat over a 12 hour period , example 7 am to 7 pm, and STOP after  your last meal of the day. Drink water,generally about 64 ounces per day, no other drink is as healthy. Fruit juice is best enjoyed in a healthy way, by EATING the fruit.  Thanks for choosing [facility] we consider it a privelige to serve you.

## 2024-03-28 NOTE — Assessment & Plan Note (Signed)
 Patient complains of bilateral hand pain worse on the left, denies numbness tingling Stated that she has tried ibuprofen  and Tylenol  without improvement Gabapentin  100 mg 3 times daily ordered

## 2024-03-28 NOTE — Assessment & Plan Note (Signed)
 Followed by infectious disease Continue Genvoya  1 tablet daily

## 2024-03-28 NOTE — Assessment & Plan Note (Addendum)
 Lab Results  Component Value Date   HGBA1C 10.7 (A) 03/28/2024   Type 2 diabetes mellitus with hyperglycemia Persistent hyperglycemia with A1c at 10.7. On maximum Ozempic  and high-dose insulin  glargine. Prefers to avoid oral medications. Dietary challenges due to work environment. Risk of complications if glycemic control not improved. - Increase insulin  glargine to 40 units subcutaneously twice daily. - Refer to endocrinologist for short-acting insulin  initiation, their information provided - Coordinate with clinical pharmacist for insulin  management. - Resend Freestyle glucometer order to insurance. - Encourage dietary modifications: reduce sugar, sweets, soda, high-carbohydrate foods. - Encourage regular exercise: 30 minutes of heart rate-increasing activity, five days a week. Need to get diabetes under control and before yearly eye exam also discussed Diabetes foot exam at next visit

## 2024-03-28 NOTE — Progress Notes (Signed)
 Established Patient Office Visit  Subjective:  Patient ID: Beverly Johnson, female    DOB: 03-May-1976  Age: 48 y.o. MRN: [id number]  CC:  Chief Complaint  Patient presents with   Diabetes    HPI   Discussed the use of AI scribe software for clinical note transcription with the patient, who gave verbal consent to proceed.  History of Present Illness Beverly Johnson is a 48 year old female  has a past medical history of Back pain, DM2 (diabetes mellitus, type 2) (HCC), HIV infection (HCC), Human immunodeficiency virus (HIV) disease (HCC) (09/29/2006), Menorrhagia (11/05/2022), Microalbuminuria due to type 2 diabetes mellitus (HCC), Obesity (04/27/2012), Primary hypertension (03/02/2023), Uncontrolled type 2 diabetes mellitus with hyperglycemia (HCC) (03/02/2023), and Vitamin D  deficiency (03/21/2017).  Patient presents for follow-up for her chronic medical conditions  Her current A1c is 10.7; three months ago it was 11.2. She experiences difficulty adhering to a diabetic diet due to her work environment at [employer], which limits her ability to follow dietary recommendations. She drinks a lot of water but struggles to find healthy food options at her workplace.   She is currently taking atorvastatin  20 mg daily, Semglee  insulin  38 units twice a day, and Ozempic  2 mg weekly. She has previously taken metformin  but prefers not to take pills,  Stated that her blood sugar was 131 today. She mentions that she walks at work but does not engage in regular exercise.  Denies polyuria polydipsia  She has not yet seen an eye doctor for her diabetes management and has not scheduled an appointment with an endocrinologist despite a referral.       Past Medical History:  Diagnosis Date   Back pain    DM2 (diabetes mellitus, type 2) (HCC)    HIV infection (HCC)    Human immunodeficiency virus (HIV) disease (HCC) 09/29/2006   Qualifier: Diagnosis of   By: Janna MD, Kelly         Menorrhagia  11/05/2022   Microalbuminuria due to type 2 diabetes mellitus (HCC)    Obesity 04/27/2012   Current weight 279lb 04/27/12     Primary hypertension 03/02/2023   Uncontrolled type 2 diabetes mellitus with hyperglycemia (HCC) 03/02/2023   Vitamin D  deficiency 03/21/2017    Past Surgical History:  Procedure Laterality Date   CESAREAN SECTION     x3   CHOLECYSTECTOMY  06/29/2011   Procedure: LAPAROSCOPIC CHOLECYSTECTOMY WITH INTRAOPERATIVE CHOLANGIOGRAM;  Surgeon: Deward GORMAN Curvin DOUGLAS, MD;  Location: [facility] OR;  Service: General;  Laterality: N/A;   TUBAL LIGATION      Family History  Problem Relation Age of Onset   Healthy Mother    Healthy Father    Breast cancer Neg Hx     Social History   Socioeconomic History   Marital status: Married    Spouse name: Not on file   Number of children: Not on file   Years of education: 10   Highest education level: Not on file  Occupational History    Employer: UNEMPLOYED  Tobacco Use   Smoking status: Never   Smokeless tobacco: Never  Vaping Use   Vaping status: Never Used  Substance and Sexual Activity   Alcohol use: No   Drug use: Not Currently    Types: Marijuana    Comment: CBD   Sexual activity: Yes    Partners: Male    Birth control/protection: None  Other Topics Concern   Not on file  Social History Narrative  Not on file   Social Drivers of Health   Financial Resource Strain: Not on file  Food Insecurity: No Food Insecurity (02/24/2021)   Hunger Vital Sign    Worried About Running Out of Food in the Last Year: Never true    Ran Out of Food in the Last Year: Never true  Transportation Needs: No Transportation Needs (02/24/2021)   PRAPARE - Administrator, Civil Service (Medical): No    Lack of Transportation (Non-Medical): No  Physical Activity: Not on file  Stress: Not on file  Social Connections: Not on file  Intimate Partner Violence: Not on file    Outpatient Medications Prior to Visit  Medication Sig  Dispense Refill   Accu-Chek Softclix Lancets lancets 1 each 3 (three) times daily.     atorvastatin  (LIPITOR) 20 MG tablet Take 1 tablet (20 mg total) by mouth daily. 30 tablet 5   cyclobenzaprine  (FLEXERIL ) 10 MG tablet Take 1 tablet (10 mg total) by mouth at bedtime. 20 tablet 0   elvitegravir-cobicistat-emtricitabine -tenofovir  (GENVOYA ) 150-150-200-10 MG TABS tablet Take 1 tablet by mouth daily with breakfast. 30 tablet 5   GLOBAL EASE INJECT PEN NEEDLES 32G X 4 MM MISC      glucose blood (ACCU-CHEK GUIDE TEST) test strip 1 each by Other route in the morning and at bedtime. 100 each 4   glucose blood test strip Check your sugar in the morning before you eat breakfast, and one hour after a meal. 100 each 2   ibuprofen  (ADVIL ) 800 MG tablet Take 1 tablet (800 mg total) by mouth 3 (three) times daily. 15 tablet 0   RESTASIS 0.05 % ophthalmic emulsion      tranexamic acid  (LYSTEDA ) 650 MG TABS tablet TAKE TWO TABLETS BY MOUTH THREE TIMES A DAY . TAKE DURING MENSES FOR A MAXIMUM OF FIVE DAYS 30 tablet 5   insulin  glargine-yfgn (SEMGLEE ) 100 UNIT/ML Pen Inject 38 Units into the skin in the morning and at bedtime. 15 mL 4   Semaglutide , 2 MG/DOSE, 8 MG/3ML SOPN Inject 2 mg as directed once a week. 3 mL 3   Continuous Glucose Sensor (FREESTYLE LIBRE 3 SENSOR) MISC Place 1 sensor on the skin every 14 days. Use to check glucose continuously (Patient not taking: Reported on 03/28/2024) 2 each 3   No facility-administered medications prior to visit.    No Known Allergies  ROS Review of Systems  Constitutional:  Negative for appetite change, chills, fatigue and fever.  HENT:  Negative for congestion, postnasal drip, rhinorrhea and sneezing.   Respiratory:  Negative for cough, shortness of breath and wheezing.   Cardiovascular:  Negative for chest pain, palpitations and leg swelling.  Gastrointestinal:  Negative for abdominal pain, constipation, nausea and vomiting.  Genitourinary:  Negative for  difficulty urinating, dysuria, flank pain and frequency.  Musculoskeletal:  Positive for arthralgias. Negative for joint swelling and myalgias.  Skin:  Negative for color change, pallor, rash and wound.  Neurological:  Negative for dizziness, facial asymmetry, weakness, numbness and headaches.  Psychiatric/Behavioral:  Negative for behavioral problems, confusion, self-injury and suicidal ideas.       Objective:    Physical Exam Vitals and nursing note reviewed.  Constitutional:      General: She is not in acute distress.    Appearance: Normal appearance. She is obese. She is not ill-appearing, toxic-appearing or diaphoretic.  Eyes:     General: No scleral icterus. Cardiovascular:     Rate and Rhythm: Normal rate and  regular rhythm.     Pulses: Normal pulses.     Heart sounds: Normal heart sounds. No murmur heard.    No friction rub. No gallop.  Pulmonary:     Effort: Pulmonary effort is normal. No respiratory distress.     Breath sounds: Normal breath sounds. No stridor. No wheezing, rhonchi or rales.  Chest:     Chest wall: No tenderness.  Abdominal:     General: There is no distension.     Palpations: Abdomen is soft.     Tenderness: There is no abdominal tenderness. There is no right CVA tenderness, left CVA tenderness or guarding.  Musculoskeletal:        General: No swelling, tenderness, deformity or signs of injury.     Right lower leg: No edema.     Left lower leg: No edema.     Comments: No tenderness, redness or swelling noted on examination of bilateral hands.  Skin is warm and dry  Skin:    General: Skin is warm and dry.     Capillary Refill: Capillary refill takes less than 2 seconds.     Coloration: Skin is not jaundiced or pale.     Findings: No bruising, erythema or lesion.  Neurological:     Mental Status: She is alert and oriented to person, place, and time.     Motor: No weakness.     Coordination: Coordination normal.     Gait: Gait normal.   Psychiatric:        Mood and Affect: Mood normal.        Behavior: Behavior normal.        Thought Content: Thought content normal.        Judgment: Judgment normal.     BP 128/77   Pulse 86   Temp (!) 97.5 F (36.4 C)   Wt 284 lb (128.8 kg)   SpO2 100%   BMI 44.48 kg/m  Wt Readings from Last 3 Encounters:  03/28/24 284 lb (128.8 kg)  12/24/23 289 lb (131.1 kg)  12/22/23 282 lb (127.9 kg)    Lab Results  Component Value Date   TSH 1.105 06/17/2019   Lab Results  Component Value Date   WBC 3.6 (L) 12/24/2023   HGB 10.1 (L) 12/24/2023   HCT 32.8 (L) 12/24/2023   MCV 74.9 (L) 12/24/2023   PLT 178 12/24/2023   Lab Results  Component Value Date   NA 137 12/24/2023   K 4.0 12/24/2023   CO2 26 12/24/2023   GLUCOSE 151 (H) 12/24/2023   BUN 11 12/24/2023   CREATININE 0.63 12/24/2023   BILITOT 0.3 12/24/2023   ALKPHOS 111 04/01/2023   AST 19 12/24/2023   ALT 13 12/24/2023   PROT 7.2 12/24/2023   ALBUMIN 3.1 (L) 04/01/2023   CALCIUM  8.7 12/24/2023   ANIONGAP 4 (L) 04/01/2023   EGFR 111 12/24/2023   Lab Results  Component Value Date   CHOL 130 12/24/2023   Lab Results  Component Value Date   HDL 52 12/24/2023   Lab Results  Component Value Date   LDLCALC 61 12/24/2023   Lab Results  Component Value Date   TRIG 83 12/24/2023   Lab Results  Component Value Date   CHOLHDL 2.5 12/24/2023   Lab Results  Component Value Date   HGBA1C 10.7 (A) 03/28/2024      Assessment & Plan:  Assessment and Plan Assessment & Plan       Problem List Items Addressed This Visit  Endocrine   Microalbuminuria due to type 2 diabetes mellitus (HCC)   On Ozempic  2 mg once weekly Blood pressure is well-controlled without medication      Relevant Medications   insulin  glargine-yfgn (SEMGLEE ) 100 UNIT/ML Pen   Semaglutide , 2 MG/DOSE, 8 MG/3ML SOPN   Uncontrolled type 2 diabetes mellitus with hyperglycemia (HCC) - Primary   Lab Results  Component Value  Date   HGBA1C 10.7 (A) 03/28/2024   Type 2 diabetes mellitus with hyperglycemia Persistent hyperglycemia with A1c at 10.7. On maximum Ozempic  and high-dose insulin  glargine. Prefers to avoid oral medications. Dietary challenges due to work environment. Risk of complications if glycemic control not improved. - Increase insulin  glargine to 40 units subcutaneously twice daily. - Refer to endocrinologist for short-acting insulin  initiation, their information provided - Coordinate with clinical pharmacist for insulin  management. - Resend Freestyle glucometer order to insurance. - Encourage dietary modifications: reduce sugar, sweets, soda, high-carbohydrate foods. - Encourage regular exercise: 30 minutes of heart rate-increasing activity, five days a week. Need to get diabetes under control and before yearly eye exam also discussed Diabetes foot exam at next visit        Relevant Medications   insulin  glargine-yfgn (SEMGLEE ) 100 UNIT/ML Pen   Semaglutide , 2 MG/DOSE, 8 MG/3ML SOPN   Other Relevant Orders   POCT glycosylated hemoglobin (Hb A1C) (Completed)   AMB Referral VBCI Care Management     Other   Human immunodeficiency virus (HIV) disease (HCC) (Chronic)   Followed by infectious disease Continue Genvoya  1 tablet daily      Morbid obesity with BMI of 45.0-49.9, adult (HCC)   Wt Readings from Last 3 Encounters:  03/28/24 284 lb (128.8 kg)  12/24/23 289 lb (131.1 kg)  12/22/23 282 lb (127.9 kg)   Body mass index is 44.48 kg/m.  Patient counseled on low-carb diet, encouraged to engage in regular moderate vigorous exercises at least 150 minutes weekly as tolerated On Ozempic  for type 2 diabetes      Relevant Medications   insulin  glargine-yfgn (SEMGLEE ) 100 UNIT/ML Pen   Semaglutide , 2 MG/DOSE, 8 MG/3ML SOPN   Bilateral hand pain   Patient complains of bilateral hand pain worse on the left, denies numbness tingling Stated that she has tried ibuprofen  and Tylenol  without  improvement Gabapentin  100 mg 3 times daily ordered       Relevant Medications   gabapentin  (NEURONTIN ) 100 MG capsule   Other Visit Diagnoses       Screening mammogram for breast cancer       Relevant Orders   MM 3D SCREENING MAMMOGRAM BILATERAL BREAST       Meds ordered this encounter  Medications   insulin  glargine-yfgn (SEMGLEE ) 100 UNIT/ML Pen    Sig: Inject 40 Units into the skin 2 (two) times daily.    Dispense:  15 mL    Refill:  4   gabapentin  (NEURONTIN ) 100 MG capsule    Sig: Take 1 capsule (100 mg total) by mouth 3 (three) times daily.    Dispense:  90 capsule    Refill:  3   Semaglutide , 2 MG/DOSE, 8 MG/3ML SOPN    Sig: Inject 2 mg as directed once a week.    Dispense:  3 mL    Refill:  3    Follow-up: Return in about 4 weeks (around 04/25/2024) for hand pain.    Tonimarie Gritz R Shardea Cwynar, FNP

## 2024-04-12 ENCOUNTER — Ambulatory Visit: Payer: Self-pay

## 2024-04-12 ENCOUNTER — Other Ambulatory Visit: Payer: Self-pay

## 2024-04-12 MED ORDER — ATORVASTATIN CALCIUM 20 MG PO TABS: 20.0000 mg | ORAL_TABLET | Freq: Every day | ORAL | 0 refills | Status: DC

## 2024-04-12 NOTE — Progress Notes (Deleted)
 04/12/2024 Name: [name] MRN: [id number] DOB: 1976-05-04  No chief complaint on file.   Beverly Johnson is a 48 y.o. year old female who was referred for medication management by their primary care provider, [name], [name], FNP. They presented for a face to face visit today.   They were referred to the pharmacist by their PCP for assistance in managing diabetes. PMH includes HTN, T2DM with microalbuminuria and neuropathy, HIV, BMI > 35.    Subjective: Patient was last seen by PCP, [name], NP, on 03/28/24. At last visit, BP was controlled at 128/77 mmHg, HR 86 bpm. Her A1C was 10.7% (previously 11.2%). She expressed difficulty adhering to healthy food choices since she works at [employer]. Patient reported taking insulin  glargine and Ozempic , but no longer taking metformin  due to not preferring oral medications. She was instructed to increase insulin  glargine to 40 units BID,  Today, patient presents in *** good spirits and presents without *** any assistance. ***Patient is accompanied by ***.   Start CGM Is she using insulin  vial (semglee  vial)? > switch to Lantus  pen OR try to get tresiba U200 or Toujeo  U300 approved Call [facility] to try to get all meds filled for 90ds   Care Team: Primary Care Provider: [name], [name], FNP ; Next Scheduled Visit: needs to be scheduled   Medication Access/Adherence  Current Pharmacy:  [facility] - [place], [place] - [address] [facility] [address] [place] [address] Phone: [phone] Fax: [phone]  [facility] [address] [place] [address] Phone: [phone] Fax: [phone]  [facility] - [facility] [address] [place] [address] Phone: [phone] Fax: [phone]   Patient reports affordability concerns with their medications: {YES/NO:21197} Patient reports access/transportation concerns to their pharmacy:  {YES/NO:21197} Patient reports adherence concerns with their medications:  No  - fill history for insulin  glargine and Ozempic  is appropriate  *** Patient denies adherence with medications, reports missing *** medications *** times per week, on average.   Diabetes:  Current medications: insulin  glargine (Semglee  vial?***) 40 units BID, Ozempic  2 mg weekly Medications tried in the past: ***  Current glucose readings: *** Using *** meter; testing *** times daily  Date of Download: *** % Time CGM is active: ***% Average Glucose: *** mg/dL Glucose Management Indicator: ***  Glucose Variability: *** (goal <36%) Time in Goal:  - Time in range 70-180: ***% - Time above range: ***% - Time below range: ***% Observed patterns:  Patient {Actions; denies-reports:120008} hypoglycemic s/sx including ***dizziness, shakiness, sweating. Patient {Actions; denies-reports:120008} hyperglycemic symptoms including ***polyuria, polydipsia, polyphagia, nocturia, neuropathy, blurred vision.  Current meal patterns:  - Breakfast: *** - Lunch *** - Supper *** - Snacks *** - Drinks ***  Current physical activity: ***  Current medication access support: ***  Hypertension:  Current medications: lisinopril  5 mg daily?*** Medications previously tried:   Patient {HAS/DOES NOT YJCZ:65250} a validated, automated, upper arm home BP cuff Current blood pressure readings readings: ***  Patient {Actions; denies-reports:120008} hypotensive s/sx including ***dizziness, lightheadedness.  Patient {Actions; denies-reports:120008} hypertensive symptoms including ***headache, chest pain, shortness of breath  Current meal patterns: ***  Current physical activity: ***   Hyperlipidemia/ASCVD Risk Reduction  Current lipid lowering medications: atorvastatin  20 mg daily Medications tried in the past:   Antiplatelet regimen:   ASCVD History:  Family History:  Risk Factors:   Current physical activity:  ***  Current medication access support: ***  Clinical ASCVD: No  The 10-year ASCVD risk score (Arnett DK, et al., 2019) is: 2.1%   Values used to calculate the score:     Age: 50 years     Clincally relevant sex: Female     Is Non-Hispanic African American: Yes     Diabetic: Yes     Tobacco smoker: No     Systolic Blood Pressure: 128 mmHg     Is BP treated: No     HDL Cholesterol: 52 mg/dL     Total Cholesterol: 130 mg/dL     Objective:  BP Readings from Last 3 Encounters:  03/28/24 128/77  12/27/23 121/81  12/24/23 129/86    Lab Results  Component Value Date   HGBA1C 10.7 (A) 03/28/2024   HGBA1C 11.2 (A) 12/22/2023   HGBA1C 10.8 (A) 09/22/2023       Latest Ref Rng & Units 12/24/2023    8:40 AM 09/22/2023   10:38 AM 04/01/2023    6:25 AM  BMP  Glucose 65 - 99 mg/dL 848  703  788   BUN 7 - 25 mg/dL 11  9  8    Creatinine 0.50 - 0.99 mg/dL 9.36  9.24  9.28   BUN/Creat Ratio 6 - 22 (calc) SEE NOTE:  12    Sodium 135 - 146 mmol/L 137  135  133   Potassium 3.5 - 5.3 mmol/L 4.0  4.0  3.5   Chloride 98 - 110 mmol/L 107  100  106   CO2 20 - 32 mmol/L 26  21  23    Calcium  8.6 - 10.2 mg/dL 8.7  9.0  8.3     Lab Results  Component Value Date   CHOL 130 12/24/2023   HDL 52 12/24/2023   LDLCALC 61 12/24/2023   TRIG 83 12/24/2023   CHOLHDL 2.5 12/24/2023    Medications Reviewed Today   Medications were not reviewed in this encounter       Assessment/Plan:   Diabetes: - Currently {CHL Controlled/Uncontrolled:[phone]} with most recent A1C of *** {Desc; above/below:16086} goal {CCM A1c HNJOD:[id number]}. Medication adherence appears ***. Control is suboptimal due to ***.   - Last UACR *** - Patient denies personal or family history of multiple endocrine neoplasia type 2, medullary thyroid cancer; personal history of pancreatitis or gallbladder disease.*** - Reviewed long term cardiovascular and renal outcomes of uncontrolled blood sugar - Reviewed goal A1c, goal  fasting, and goal 2 hour post prandial glucose - Reviewed hypoglycemia management plan and the rule of 15*** - Reviewed dietary modifications including *** utilizing the healthy plate method, limiting portion size of carbohydrate foods, increasing intake of protein and non-starchy vegetables. Counseled patient to stay hydrated with water throughout the day. - Reviewed lifestyle modifications including: aiming for 150 minutes of moderate intensity exercise every week. *** - Recommend to ***  - Recommend to check glucose twice daily: fasting and 2-hr PPG ***. Counseled patient to bring glucometer or BG log to every appointment. - Next A1C due ***    Meets financial criteria for *** patient assistance program through ***. Will collaborate with provider, CPhT, and patient to pursue assistance.     Hypertension: - Currently {CHL Controlled/Uncontrolled:[phone]} with *** BP consistently {Desc; above/below:16086} goal {BP Goal:27557}. Patient {ACTION; IS/IS WNU:[id number]} having s/sx of hypo- or hyper-tension. Medication adherence appears {Desc; appropriate/inappropriate:30686}. Control is suboptimal due to ***.  - Reviewed long term cardiovascular and renal outcomes of uncontrolled blood pressure - Reviewed appropriate blood pressure  monitoring technique and reviewed goal blood pressure. Recommended to check home blood pressure and heart rate *** once daily and keep a log to bring to upcoming appointments - Recommend to ***     Hyperlipidemia/ASCVD Risk Reduction: - Currently {CHL Controlled/Uncontrolled:[phone]} with most recent LDL-C of *** {Desc; above/below:16086} goal {LDL Goals:32982} given ***. *** intensity statin indicated. - Reviewed long term complications of uncontrolled cholesterol - Reviewed lifestyle recommendations to lower LDL-C including regular physical activity, 5-10% weight loss, eating a diet low in saturated fat, and increasing intake of fiber to at least 25 g per day. -  Reviewed lifestyle recommendations to lower TG including following a low-carb diet, restricting alcohol intake, and increasing physical activity and exercise - Recommend to ***     Written patient instructions provided. Patient verbalized understanding of treatment plan.   Follow Up Plan:  Pharmacist *** PCP clinic visit in ***  Lorain Baseman, PharmD [facility] [phone]

## 2024-04-24 ENCOUNTER — Telehealth: Payer: Self-pay | Admitting: *Deleted

## 2024-04-24 NOTE — Progress Notes (Unsigned)
 Care Guide Pharmacy Note  04/24/2024 Name: Beverly Johnson MRN: [id number] DOB: 04/21/1976  Referred By: [name], [name], FNP Reason for referral: Complex Care Management (Initial outreach to schedule referral with PharmD)   Beverly Johnson is a 48 y.o. year old female who is a primary care patient of [name], [name], FNP.  Beverly Johnson was referred to the pharmacist for assistance related to: DMII  An unsuccessful telephone outreach was attempted today to contact the patient who was referred to the pharmacy team for assistance with Diease management . Additional attempts will be made to contact the patient.  Harlene Satterfield  [facility]  Value[facility]Based Care Institute, [facility] Guide  Direct Dial: [phone]  Fax [phone]

## 2024-04-25 NOTE — Progress Notes (Signed)
 Care Guide Pharmacy Note  04/25/2024 Name: Beverly Johnson MRN: [id number] DOB: 1976-03-05  Referred By: [name], [name], FNP Reason for referral: Complex Care Management (Initial outreach to schedule referral with PharmD)   [name] is a 48 y.o. year old female who is a primary care patient of [name], [name], FNP.  [name] was referred to the pharmacist for assistance related to: DMII  A second unsuccessful telephone outreach was attempted today to contact the patient who was referred to the pharmacy team for assistance with Diease management . Additional attempts will be made to contact the patient.  Harlene Satterfield  [facility]  Value-Based Care Institute, [facility] Guide  Direct Dial: [phone]  Fax [phone]

## 2024-04-25 NOTE — Progress Notes (Signed)
 Care Guide Pharmacy Note  04/25/2024 Name: [name] MRN: [id number] DOB: 12/29/1975  Referred By: [name], [name], FNP Reason for referral: Complex Care Management (Initial outreach to schedule referral with PharmD)   Beverly Johnson is a 48 y.o. year old female who is a primary care patient of [name], [name], FNP.  [name] was referred to the pharmacist for assistance related to: DMII  Successful contact was made with the patient to discuss pharmacy services including being ready for the pharmacist to call at least 5 minutes before the scheduled appointment time and to have medication bottles and any blood pressure readings ready for review. The patient agreed to meet with the pharmacist via in office  on (date/time). 06/07/24 at 900 AM [facility] Address: [address] [place], [place], [place] [address]  Harlene Satterfield  [facility]  Value-Based Care Institute, [facility] Guide  Direct Dial: [phone]  Fax [phone]

## 2024-05-18 ENCOUNTER — Other Ambulatory Visit: Payer: Self-pay | Admitting: Nurse Practitioner

## 2024-05-18 DIAGNOSIS — I10 Essential (primary) hypertension: Secondary | ICD-10-CM

## 2024-05-26 ENCOUNTER — Other Ambulatory Visit: Payer: Self-pay

## 2024-06-07 ENCOUNTER — Ambulatory Visit: Payer: Self-pay

## 2024-06-07 ENCOUNTER — Telehealth: Payer: Self-pay

## 2024-06-07 NOTE — Telephone Encounter (Signed)
 Attempted to contact patient for scheduled appointment for medication management. Patient cancelled in person appt via E2C2 this AM. Attempted listed # x2 and call could not be completed as dialed. Will attempt to reschedule.  Lorain Baseman, PharmD [facility] [phone]

## 2024-06-07 NOTE — Progress Notes (Deleted)
 06/07/2024 Name: Beverly Johnson MRN: [id number] DOB: [birth year]  No chief complaint on file.   Beverly Johnson is a 48 y.o. year old female who was referred for medication management by their primary care provider, [name], [name], FNP. They presented for a face to face visit today.   They were referred to the pharmacist by their PCP for assistance in managing diabetes . PMH includes HTN, T2DM with microalbuminuria and neuropathy, HIV,    Subjective: Patient was last seen by PCP, [name], NP, on 03/28/24. At last visit, A1C improved from 11.2% to 1.7%. She expressed difficulty adhering to healthy food choices due to working at [employer]. She was instructed to increase Lantus  to 40 units twice daily. She was also referred to endocrinology.  Today, patient presents in *** good spirits and presents without *** any assistance. ***Patient is accompanied by ***.   Reschedule with PCP   Care Team: Primary Care Provider: [name], [name], FNP ; Next Scheduled Visit: needs to be scheduled {careteamprovider:27366}  Medication Access/Adherence  Current Pharmacy:  [facility] [place], [place] - [address] [facility] [address] [place] [address] Phone: [phone] Fax: [phone]  [facility] [address] [place] [address] Phone: [phone] Fax: [phone]  [facility] - [facility] [address] [place] [address] Phone: [phone] Fax: [phone]   Patient reports affordability concerns with their medications: No  - Medicaid Patient reports access/transportation concerns to their pharmacy: {YES/NO:21197} Patient reports adherence concerns with their medications:  {YES/NO:21197} ***  *** Patient denies adherence with medications, reports missing *** medications *** times per week, on average.   Diabetes:  Current medications: Lantus  40 units BID, Ozempic  2 mg  weekly Medications tried in the past: metformin   Current glucose readings: *** Accu Chek Meter  CGM?***  Date of Download: *** % Time CGM is active: ***% Average Glucose: *** mg/dL Glucose Management Indicator: ***  Glucose Variability: *** (goal <36%) Time in Goal:  - Time in range 70-180: ***% - Time above range: ***% - Time below range: ***% Observed patterns:  Patient {Actions; denies-reports:120008} hypoglycemic s/sx including ***dizziness, shakiness, sweating. Patient {Actions; denies-reports:120008} hyperglycemic symptoms including ***polyuria, polydipsia, polyphagia, nocturia, neuropathy, blurred vision.  Current meal patterns:  - Breakfast: *** - Lunch *** - Supper *** - Snacks *** - Drinks ***  Current physical activity: ***  Current medication access support: ***  Macrovascular and Microvascular Risk Reduction:  Statin? yes (atorvastatin  20 mg daily); ACEi/ARB? no Last urinary albumin/creatinine ratio:  Lab Results  Component Value Date   MICRALBCREAT <3 09/22/2023   MICRALBCREAT 4 11/24/2022   MICRALBCREAT 409.9 (H) 03/16/2017   MICRALBCREAT 4.4 12/04/2015   MICRALBCREAT 2.1 02/27/2014   MICRALBCREAT 1.6 04/27/2012   Last eye exam:  Lab Results  Component Value Date   HMDIABEYEEXA No Retinopathy 12/04/2015   Last foot exam: 03/02/2023 Tobacco Use:  Tobacco Use: Low Risk  (03/28/2024)   Patient History    Smoking Tobacco Use: Never    Smokeless Tobacco Use: Never    Passive Exposure: Not on file     Objective:  BP Readings from Last 3 Encounters:  03/28/24 128/77  12/27/23 121/81  12/24/23 129/86    Lab Results  Component Value Date   HGBA1C 10.7 (A) 03/28/2024   HGBA1C 11.2 (A) 12/22/2023   HGBA1C 10.8 (A) 09/22/2023       Latest Ref Rng &  Units 12/24/2023    8:40 AM 09/22/2023   10:38 AM 04/01/2023    6:25 AM  BMP  Glucose 65 - 99 mg/dL 848  703  788   BUN 7 - 25 mg/dL 11  9  8    Creatinine 0.50 - 0.99 mg/dL 9.36  9.24  9.28    BUN/Creat Ratio 6 - 22 (calc) SEE NOTE:  12    Sodium 135 - 146 mmol/L 137  135  133   Potassium 3.5 - 5.3 mmol/L 4.0  4.0  3.5   Chloride 98 - 110 mmol/L 107  100  106   CO2 20 - 32 mmol/L 26  21  23    Calcium  8.6 - 10.2 mg/dL 8.7  9.0  8.3     Lab Results  Component Value Date   CHOL 130 12/24/2023   HDL 52 12/24/2023   LDLCALC 61 12/24/2023   TRIG 83 12/24/2023   CHOLHDL 2.5 12/24/2023    Medications Reviewed Today   Medications were not reviewed in this encounter       Assessment/Plan:   Diabetes: - Currently {CHL Controlled/Uncontrolled:[id number]} with most recent A1C of *** {Desc; above/below:16086} goal {CCM A1c HNJOD:[id number]}. Medication adherence appears ***. Control is suboptimal due to ***.   - Last UACR Feb 2025 < 3 mg/g (> 400 mg/g in 2018) - Patient denies personal or family history of multiple endocrine neoplasia type 2, medullary thyroid cancer; personal history of pancreatitis or gallbladder disease.*** - Reviewed long term cardiovascular and renal outcomes of uncontrolled blood sugar - Reviewed goal A1c, goal fasting, and goal 2 hour post prandial glucose - Reviewed hypoglycemia management plan and the rule of 15*** - Reviewed dietary modifications including *** utilizing the healthy plate method, limiting portion size of carbohydrate foods, increasing intake of protein and non-starchy vegetables. Counseled patient to stay hydrated with water throughout the day. - Reviewed lifestyle modifications including: aiming for 150 minutes of moderate intensity exercise every week. *** - Recommend to ***  - Recommend to check glucose twice daily: fasting and 2-hr PPG ***. Counseled patient to bring glucometer or BG log to every appointment. - Next A1C due ***    Meets financial criteria for *** patient assistance program through ***. Will collaborate with provider, CPhT, and patient to pursue assistance.     Written patient instructions provided. Patient  verbalized understanding of treatment plan.   Follow Up Plan: *** Pharmacist *** PCP clinic visit in *** Patient seen with ***  ***

## 2024-06-08 ENCOUNTER — Telehealth: Payer: Self-pay

## 2024-06-08 NOTE — Progress Notes (Unsigned)
 Complex Care Management Care Guide Note  06/08/2024 Name: Beverly Johnson MRN: [id number] DOB: [birth year]  Beverly Johnson is a 48 y.o. year old female who is a primary care patient of [name], [name], FNP and is actively engaged with the care management team. I reached out to Beverly Johnson by phone today to assist with re-scheduling  with the Pharmacist.  Follow up plan: Unsuccessful telephone outreach attempt made. A HIPAA compliant phone message was left for the patient providing contact information and requesting a return call.  Leotis Rase [facility], [facility] Guide  Direct Dial: [phone]  Fax [phone]

## 2024-06-09 NOTE — Progress Notes (Signed)
 Complex Care Management Care Guide Note  06/09/2024 Name: Beverly Johnson MRN: [id number] DOB: [birth year]  Beverly Johnson is a 48 y.o. year old female who is a primary care patient of [name], [name], FNP and is actively engaged with the care management team. I reached out to Beverly Johnson by phone today to assist with re-scheduling  with the Pharmacist.  Follow up plan: Unsuccessful telephone outreach attempt made. A HIPAA compliant phone message was left for the patient providing contact information and requesting a return call.  Leotis Rase [facility], [facility] Guide  Direct Dial: [phone]  Fax [phone]

## 2024-06-12 ENCOUNTER — Other Ambulatory Visit: Payer: Self-pay

## 2024-06-12 ENCOUNTER — Emergency Department (HOSPITAL_COMMUNITY)
Admission: EM | Admit: 2024-06-12 | Discharge: 2024-06-12 | Disposition: A | Discharge status: Home / Self Care | Attending: Emergency Medicine | Admitting: Emergency Medicine

## 2024-06-12 ENCOUNTER — Emergency Department (HOSPITAL_COMMUNITY)

## 2024-06-12 ENCOUNTER — Encounter (HOSPITAL_COMMUNITY): Payer: Self-pay

## 2024-06-12 DIAGNOSIS — K209 Esophagitis, unspecified without bleeding: Secondary | ICD-10-CM | POA: Insufficient documentation

## 2024-06-12 DIAGNOSIS — E1140 Type 2 diabetes mellitus with diabetic neuropathy, unspecified: Secondary | ICD-10-CM | POA: Diagnosis not present

## 2024-06-12 DIAGNOSIS — K859 Acute pancreatitis without necrosis or infection, unspecified: Secondary | ICD-10-CM | POA: Diagnosis not present

## 2024-06-12 DIAGNOSIS — Z21 Asymptomatic human immunodeficiency virus [HIV] infection status: Secondary | ICD-10-CM | POA: Diagnosis not present

## 2024-06-12 DIAGNOSIS — K5900 Constipation, unspecified: Secondary | ICD-10-CM | POA: Diagnosis not present

## 2024-06-12 DIAGNOSIS — R112 Nausea with vomiting, unspecified: Secondary | ICD-10-CM | POA: Diagnosis not present

## 2024-06-12 DIAGNOSIS — Z794 Long term (current) use of insulin: Secondary | ICD-10-CM | POA: Insufficient documentation

## 2024-06-12 DIAGNOSIS — R109 Unspecified abdominal pain: Secondary | ICD-10-CM | POA: Diagnosis present

## 2024-06-12 DIAGNOSIS — K760 Fatty (change of) liver, not elsewhere classified: Secondary | ICD-10-CM | POA: Diagnosis not present

## 2024-06-12 DIAGNOSIS — D259 Leiomyoma of uterus, unspecified: Secondary | ICD-10-CM | POA: Diagnosis not present

## 2024-06-12 LAB — COMPREHENSIVE METABOLIC PANEL WITH GFR
ALT: 11 U/L (ref 0–44)
AST: 28 U/L (ref 15–41)
Albumin: 3.7 g/dL (ref 3.5–5.0)
Alkaline Phosphatase: 110 U/L (ref 38–126)
Anion gap: 5 (ref 5–15)
BUN: 9 mg/dL (ref 6–20)
CO2: 25 mmol/L (ref 22–32)
Calcium: 9 mg/dL (ref 8.9–10.3)
Chloride: 106 mmol/L (ref 98–111)
Creatinine, Ser: 0.77 mg/dL (ref 0.44–1.00)
GFR, Estimated: >60 mL/min (ref >60)
Glucose, Bld: 228 mg/dL — ABNORMAL HIGH (ref 70–99)
Potassium: 3.7 mmol/L (ref 3.5–5.1)
Sodium: 136 mmol/L (ref 135–145)
Total Bilirubin: 0.5 mg/dL (ref 0.0–1.2)
Total Protein: 8.2 g/dL — ABNORMAL HIGH (ref 6.5–8.1)

## 2024-06-12 LAB — CBC
HCT: 32.9 % — ABNORMAL LOW (ref 36.0–46.0)
Hemoglobin: 9.8 g/dL — ABNORMAL LOW (ref 12.0–15.0)
MCH: 22.6 pg — ABNORMAL LOW (ref 26.0–34.0)
MCHC: 29.8 g/dL — ABNORMAL LOW (ref 30.0–36.0)
MCV: 76 fL — ABNORMAL LOW (ref 80.0–100.0)
Platelets: 161 K/uL (ref 150–400)
RBC: 4.33 MIL/uL (ref 3.87–5.11)
RDW: 14.3 % (ref 11.5–15.5)
WBC: 4.1 K/uL (ref 4.0–10.5)
nRBC: 0 % (ref 0.0–0.2)

## 2024-06-12 LAB — URINALYSIS, ROUTINE W REFLEX MICROSCOPIC
Bilirubin Urine: NEGATIVE
Glucose, UA: >=500 mg/dL — AB
Hgb urine dipstick: NEGATIVE
Ketones, ur: NEGATIVE mg/dL
Nitrite: NEGATIVE
Protein, ur: NEGATIVE mg/dL
Specific Gravity, Urine: 1.023 (ref 1.005–1.030)
pH: 5 (ref 5.0–8.0)

## 2024-06-12 LAB — LIPASE, BLOOD: Lipase: 121 U/L — ABNORMAL HIGH (ref 11–51)

## 2024-06-12 LAB — HCG, SERUM, QUALITATIVE: Preg, Serum: NEGATIVE

## 2024-06-12 MED ORDER — OMEPRAZOLE MAGNESIUM 20 MG PO TBEC: 20.0000 mg | DELAYED_RELEASE_TABLET | Freq: Every day | ORAL | 1 refills | Status: DC

## 2024-06-12 MED ORDER — IOHEXOL 300 MG/ML  SOLN
100.0000 mL | Freq: Once | INTRAMUSCULAR | Status: AC | PRN
  Administered 2024-06-12: 100 mL via INTRAVENOUS

## 2024-06-12 NOTE — ED Provider Notes (Signed)
 [place] EMERGENCY DEPARTMENT AT [facility] Provider Note   CSN: [id number] Arrival date & time: 06/12/24  1447     Patient presents with: Constipation   Beverly Johnson is a 48 y.o. female.   Patient complains of abdominal pain and vomiting.  Patient reports having vomiting whenever she eats.  Patient is currently taking Ozempic  and has had some ongoing constipation.  Patient reports constipation is worse this week.  Patient has been taking over-the-counter laxatives without relief.  Patient denies any fever or chills.  Patient denies any burning with urination.  Patient has a past medical history of diabetes, neuropathy, HIV, anemia and lower extremity edema.  Patient reports that she has vomiting whenever she attempts to eat.   The history is provided by the patient. No language interpreter was used.  Constipation Severity:  Severe Progression:  Worsening Chronicity:  New Relieved by:  Nothing Worsened by:  Nothing Ineffective treatments:  None tried Associated symptoms: vomiting   Risk factors: no change in medication and no recent antibiotic use        Prior to Admission medications   Medication Sig Start Date End Date Taking? Authorizing Provider  Accu-Chek Softclix Lancets lancets 1 each 3 (three) times daily. 02/06/21   [provider]  atorvastatin  (LIPITOR) 20 MG tablet Take 1 tablet (20 mg total) by mouth daily. 04/12/24   Dea Shiner, MD  Continuous Glucose Sensor (FREESTYLE LIBRE 3 SENSOR) MISC Place 1 sensor on the skin every 14 days. Use to check glucose continuously Patient not taking: Reported on 03/28/2024 09/22/23   [name], [name], FNP  cyclobenzaprine  (FLEXERIL ) 10 MG tablet Take 1 tablet (10 mg total) by mouth at bedtime. 12/27/23   Rising, Asberry, PA-C  elvitegravir-cobicistat-emtricitabine -tenofovir  (GENVOYA ) 150-150-200-10 MG TABS tablet Take 1 tablet by mouth daily with breakfast. 12/24/23   Dea Shiner, MD  gabapentin   (NEURONTIN ) 100 MG capsule Take 1 capsule (100 mg total) by mouth 3 (three) times daily. 03/28/24   [name], [name], FNP  GLOBAL EASE INJECT PEN NEEDLES 32G X 4 MM MISC  11/15/21   [provider]  glucose blood (ACCU-CHEK GUIDE TEST) test strip 1 each by Other route in the morning and at bedtime. 02/29/24   [name], [name], FNP  glucose blood test strip Check your sugar in the morning before you eat breakfast, and one hour after a meal. 05/26/19   Van Knee, MD  ibuprofen  (ADVIL ) 800 MG tablet Take 1 tablet (800 mg total) by mouth 3 (three) times daily. 12/27/23   Rising, Asberry, PA-C  insulin  glargine-yfgn (SEMGLEE ) 100 UNIT/ML Pen Inject 40 Units into the skin 2 (two) times daily. 03/28/24   [name], [name], FNP  RESTASIS 0.05 % ophthalmic emulsion  11/06/22   [provider]  Semaglutide , 2 MG/DOSE, 8 MG/3ML SOPN Inject 2 mg as directed once a week. 03/28/24   [name], [name], FNP  tranexamic acid  (LYSTEDA ) 650 MG TABS tablet TAKE TWO TABLETS BY MOUTH THREE TIMES A DAY . TAKE DURING MENSES FOR A MAX OF 5 DAYS 03/29/24   Zina Jerilynn LABOR, MD  cetirizine  (ZYRTEC  ALLERGY) 10 MG tablet Take 1 tablet (10 mg total) by mouth daily. Patient not taking: Reported on 05/27/2020 11/26/19 11/08/20  Henderly, Britni A, PA-C    Allergies: Patient has no known allergies.    Review of Systems  Gastrointestinal:  Positive for constipation and vomiting.  All other systems reviewed and are negative.   Updated Vital Signs BP ROLLEN)  173/91 (BP Location: Left Arm)   Pulse 97   Temp 97.9 F (36.6 C) (Oral)   Resp 18   Ht 5' 8 (1.727 m)   Wt 124.7 kg   SpO2 99%   BMI 41.81 kg/m   Physical Exam Vitals and nursing note reviewed.  Constitutional:      Appearance: She is well-developed.  HENT:     Head: Normocephalic.     Right Ear: Tympanic membrane normal.     Left Ear: Tympanic membrane normal.     Mouth/Throat:     Mouth: Mucous membranes are moist.  Eyes:      Pupils: Pupils are equal, round, and reactive to light.  Cardiovascular:     Rate and Rhythm: Normal rate.  Pulmonary:     Effort: Pulmonary effort is normal.  Abdominal:     General: Abdomen is flat. There is no distension.  Musculoskeletal:        General: Normal range of motion.     Cervical back: Normal range of motion.  Skin:    General: Skin is warm.  Neurological:     General: No focal deficit present.     Mental Status: She is alert and oriented to person, place, and time.  Psychiatric:        Mood and Affect: Mood normal.     (all labs ordered are listed, but only abnormal results are displayed) Labs Reviewed  LIPASE, BLOOD - Abnormal; Notable for the following components:      Result Value   Lipase 121 (*)    All other components within normal limits  COMPREHENSIVE METABOLIC PANEL WITH GFR - Abnormal; Notable for the following components:   Glucose, Bld 228 (*)    Total Protein 8.2 (*)    All other components within normal limits  CBC - Abnormal; Notable for the following components:   Hemoglobin 9.8 (*)    HCT 32.9 (*)    MCV 76.0 (*)    MCH 22.6 (*)    MCHC 29.8 (*)    All other components within normal limits  URINALYSIS, ROUTINE W REFLEX MICROSCOPIC - Abnormal; Notable for the following components:   APPearance HAZY (*)    Glucose, UA >=500 (*)    Leukocytes,Ua TRACE (*)    Bacteria, UA RARE (*)    All other components within normal limits  HCG, SERUM, QUALITATIVE    EKG: None  Radiology: CT ABDOMEN PELVIS W CONTRAST Result Date: 06/12/2024 CLINICAL DATA:  Nausea and vomiting.  Bowel obstruction suspected. EXAM: CT ABDOMEN AND PELVIS WITH CONTRAST TECHNIQUE: Multidetector CT imaging of the abdomen and pelvis was performed using the standard protocol following bolus administration of intravenous contrast. RADIATION DOSE REDUCTION: This exam was performed according to the departmental dose-optimization program which includes automated exposure control,  adjustment of the mA and/or kV according to patient size and/or use of iterative reconstruction technique. CONTRAST:  OMNIPAQUE  IOHEXOL  300 MG/ML  SOLN COMPARISON:  CT 11/13/2022 FINDINGS: Lower chest: No basilar airspace disease or pleural effusion. Hepatobiliary: The liver is prominent size spanning 18.9 cm cranial caudal with mild steatosis. No focal liver lesion. Clips in the gallbladder fossa postcholecystectomy. No biliary dilatation. Pancreas: No ductal dilatation or inflammation. Spleen: Normal in size without focal abnormality. Adrenals/Urinary Tract: Normal adrenal glands. No hydronephrosis, renal calculi, or suspicious renal lesion. Unremarkable urinary bladder. Stomach/Bowel: No bowel obstruction. Patulous distal esophagus with mild wall thickening. Ingested material within the stomach. No small bowel distension or inflammation. Normal appendix.  Moderate colonic stool burden, primarily in the ascending and transverse colon. Small volume of formed stool in the more distal colon. No colonic inflammation. Vascular/Lymphatic: Normal caliber abdominal aorta. Patent portal, splenic, and mesenteric veins. Shotty pelvic lymph nodes including a 12 mm left external iliac node, series 2, image 79, unchanged from prior exam. 12 mm right external iliac node series 2, image 81. similar prominent bilateral inguinal nodes. Reproductive: Enlarged uterus with calcified fibroid in the fundus, deviating the uterus in the right pelvis, unchanged. Dominant fibroid measures at least 7.4 cm, increased in size from 6.1 cm no adnexal mass. Other: No free air, free fluid, or intra-abdominal fluid collection. No abdominal wall hernia. Musculoskeletal: There are no acute or suspicious osseous abnormalities. IMPRESSION: 1. No bowel obstruction or inflammation. Moderate colonic stool burden. 2. Patulous distal esophagus with mild wall thickening, can be seen with reflux or esophagitis. 3. Enlarged uterus with calcified fibroid  in the fundus, deviating the uterus in the right pelvis, unchanged. Dominant fibroid measures at least 7.4 cm, increased in size from 6.1 cm. 4. Mild hepatic steatosis. 5. Shotty pelvic and inguinal lymph nodes are unchanged from prior exam, likely reactive. Electronically Signed   By: Andrea Gasman M.D.   On: 06/12/2024 19:07     Procedures   Medications Ordered in the ED  iohexol  (OMNIPAQUE ) 300 MG/ML solution 100 mL (100 mLs Intravenous Contrast Given 06/12/24 1805)                                     Patient has a elevation of her lipase.  Patient counseled on esophageal thickening.  Patient counseled on possible pancreatitis.  Patient is advised to try MiraLAX  and Duca locks suppositories to help with constipation.  Patient is advised to clear liquids.  Patient is advised she needs to follow-up with GI for evaluation of esophagitis.  See primary care for recheck of laboratory evaluations including recheck of lipase.  Patient is discharged in stable condition with follow-up instructions.     Final diagnoses:  Constipation, unspecified constipation type  Esophagitis  Acute pancreatitis, unspecified complication status, unspecified pancreatitis type    ED Discharge Orders     None       An After Visit Summary was printed and given to the patient.    Dorris Pierre K, PA-C 06/12/24 2155    Patsey Lot, MD 06/12/24 2303

## 2024-06-12 NOTE — ED Triage Notes (Signed)
 Patient has been having trouble for 3 weeks to have a bowel movement. Has been vomiting. Has lower middle abdominal pain.

## 2024-06-12 NOTE — ED Provider Triage Note (Signed)
 Emergency Medicine Provider Triage Evaluation Note  Beverly Johnson , a 48 y.o. female  was evaluated in triage.  Pt complains of constipation.  Patient reports feelings of increased difficulty with bowel movements for the last few weeks.  Unsure of her last bowel movement.  She endorses some nausea and vomiting with attempting to eat any food.  Endorses some pain to the lower middle abdomen.  She is unsure if she is passing gas.  Review of Systems  Positive: As above Negative: As above  Physical Exam  BP (!) 154/106   Pulse 100   Temp 98.9 F (37.2 C) (Oral)   Resp 19   Ht 5' 8 (1.727 m)   Wt 124.7 kg   SpO2 100%   BMI 41.81 kg/m  Gen:   Awake, no distress   Resp:  Normal effort  MSK:   Moves extremities without difficulty  Other:  Unable to auscultate any bowel sounds in any quadrant of the abdomen.  This may be due to body habitus.  Medical Decision Making  Medically screening exam initiated at 3:14 PM.  Appropriate orders placed.  [name] was informed that the remainder of the evaluation will be completed by another provider, this initial triage assessment does not replace that evaluation, and the importance of remaining in the ED until their evaluation is complete.     Millenia Waldvogel A, PA-C 06/12/24 1515

## 2024-06-12 NOTE — Discharge Instructions (Signed)
 Return if any problems.  Divide a bottle of MiraLAX  between 2 32 ounce Gatorade's.  Drink 8 ounces every hour until you have had 2 good bowel movements.  Try using Doculax suppositories to help with constipation.  Schedule to see Gi for evaluation.

## 2024-06-12 NOTE — ED Notes (Signed)
 Pt reports feeling itchy after contrast.  Benedryl offered, pt refused stating Im using my lotion, it is fine  This RN advised pt to let us  know if she developed additional symptoms like hives or difficulty breathing.

## 2024-06-20 ENCOUNTER — Other Ambulatory Visit: Payer: Self-pay | Admitting: Infectious Diseases

## 2024-06-20 DIAGNOSIS — B20 Human immunodeficiency virus [HIV] disease: Secondary | ICD-10-CM

## 2024-06-28 ENCOUNTER — Other Ambulatory Visit: Payer: Self-pay | Admitting: Nurse Practitioner

## 2024-06-28 DIAGNOSIS — E1165 Type 2 diabetes mellitus with hyperglycemia: Secondary | ICD-10-CM

## 2024-06-28 NOTE — Telephone Encounter (Signed)
 Due to  insurance. KH

## 2024-07-08 ENCOUNTER — Ambulatory Visit (HOSPITAL_COMMUNITY)
Admission: EM | Admit: 2024-07-08 | Discharge: 2024-07-08 | Disposition: A | Discharge status: Home / Self Care | Attending: Nurse Practitioner | Admitting: Nurse Practitioner

## 2024-07-08 ENCOUNTER — Encounter (HOSPITAL_COMMUNITY): Payer: Self-pay | Admitting: Emergency Medicine

## 2024-07-08 DIAGNOSIS — J069 Acute upper respiratory infection, unspecified: Secondary | ICD-10-CM

## 2024-07-08 DIAGNOSIS — R051 Acute cough: Secondary | ICD-10-CM | POA: Diagnosis not present

## 2024-07-08 LAB — POCT INFLUENZA A/B
Influenza A, POC: NEGATIVE
Influenza B, POC: NEGATIVE

## 2024-07-08 LAB — POC SOFIA SARS ANTIGEN FIA: SARS Coronavirus 2 Ag: NEGATIVE

## 2024-07-08 MED ORDER — PSEUDOEPH-BROMPHEN-DM 30-2-10 MG/5ML PO SYRP
10.0000 mL | ORAL_SOLUTION | Freq: Four times a day (QID) | ORAL | 0 refills | Status: AC | PRN
Start: 2024-07-08 — End: ?

## 2024-07-08 MED ORDER — FLUTICASONE PROPIONATE 50 MCG/ACT NA SUSP: 2.0000 | Freq: Every day | NASAL | 0 refills | Status: AC

## 2024-07-08 MED ORDER — CETIRIZINE-PSEUDOEPHEDRINE ER 5-120 MG PO TB12: 1.0000 | ORAL_TABLET | Freq: Every day | ORAL | 0 refills | Status: AC

## 2024-07-08 NOTE — ED Provider Notes (Signed)
 [facility]    CSN: [id number] Arrival date & time: 07/08/24  1016      History   Chief Complaint Chief Complaint  Patient presents with   Cough    HPI Beverly Johnson is a 48 y.o. female.   Discussed the use of AI scribe software for clinical note transcription with the patient, who gave verbal consent to proceed.   The patient presents with upper respiratory symptoms that began three days ago with a general feeling of becoming ill. Yesterday, she developed sneezing, coughing, and nasal congestion. She reports a runny nose and a mild sore throat. She describes feeling warm but denies documented fever. She has a mild headache but no nausea, vomiting, or diarrhea. The cough is non-productive, and she denies wheezing or shortness of breath. She has been taking Alka-Seltzer Plus and drinking green tea for symptom relief. She denies any known sick contacts and reports that she does not smoke or vape.  The following sections of the patient's history were reviewed and updated as appropriate: allergies, current medications, past family history, past medical history, past social history, past surgical history, and problem list.      Past Medical History:  Diagnosis Date   Back pain    DM2 (diabetes mellitus, type 2) (HCC)    HIV infection (HCC)    Human immunodeficiency virus (HIV) disease (HCC) 09/29/2006   Qualifier: Diagnosis of   By: Janna MD, Kelly         Menorrhagia 11/05/2022   Microalbuminuria due to type 2 diabetes mellitus (HCC)    Obesity 04/27/2012   Current weight 279lb 04/27/12     Primary hypertension 03/02/2023   Uncontrolled type 2 diabetes mellitus with hyperglycemia (HCC) 03/02/2023   Vitamin D  deficiency 03/21/2017    Patient Active Problem List   Diagnosis Date Noted   Bilateral hand pain 03/28/2024   Health care maintenance 12/24/2023   Immunization counseling 12/24/2023   Vaginal candida 09/22/2023   BV (bacterial vaginosis) 09/22/2023    Uncontrolled type 2 diabetes mellitus with hyperglycemia (HCC) 03/02/2023   Primary hypertension 03/02/2023   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   Bilateral leg edema 11/24/2022   Lymphadenopathy, abdominal 11/24/2022   Acute low back pain without sciatica 11/11/2022   Menorrhagia 11/05/2022   Anemia 03/09/2022   Leukopenia 11/25/2021   Need for prophylactic vaccination and inoculation against influenza 05/28/2021   Adenomyosis 02/24/2021   Vulvovaginal candidiasis 01/16/2021   Dysmenorrhea 01/16/2021   Callus of foot 11/26/2020   Heavy menses 11/26/2020   History of 2019 novel coronavirus disease (COVID-19) 06/16/2019   Abnormal ECG 06/16/2019   Medication monitoring encounter 11/12/2017   Breast nodule 09/02/2017   Screening examination for venereal disease 03/30/2017   Encounter for long-term (current) use of high-risk medication 03/30/2017   Microalbuminuria due to type 2 diabetes mellitus (HCC) 03/30/2017   Knee pain, chronic 03/30/2017   Chest wall pain 03/21/2017   Vitamin D  deficiency 03/21/2017   Healthcare maintenance 03/02/2014   Morbid obesity with BMI of 45.0-49.9, adult (HCC) 04/27/2012   Type 2 diabetes mellitus without complications (HCC) 12/08/2010   Human immunodeficiency virus (HIV) disease (HCC) 09/29/2006    Past Surgical History:  Procedure Laterality Date   CESAREAN SECTION     x3   CHOLECYSTECTOMY  06/29/2011   Procedure: LAPAROSCOPIC CHOLECYSTECTOMY WITH INTRAOPERATIVE CHOLANGIOGRAM;  Surgeon: Deward GORMAN Curvin DOUGLAS, MD;  Location: [facility] OR;  Service: General;  Laterality: N/A;   TUBAL  LIGATION      OB History     Gravida  3   Para  3   Term  3   Preterm      AB      Living  3      SAB      IAB      Ectopic      Multiple      Live Births  3        Obstetric Comments  c-section x 3. Last one with BTL done in the mid 2000s          Home Medications    Prior to Admission medications   Medication Sig  Start Date End Date Taking? Authorizing Provider  brompheniramine-pseudoephedrine -DM 30-2-10 MG/5ML syrup Take 10 mLs by mouth every 6 (six) hours as needed (cough and congestion). 07/08/24  Yes Iola Lukes, FNP  cetirizine -pseudoephedrine  (ZYRTEC -D) 5-120 MG tablet Take 1 tablet by mouth daily with breakfast for 10 days. 07/08/24 07/18/24 Yes Jaiceon Collister, FNP  fluticasone  (FLONASE ) 50 MCG/ACT nasal spray Place 2 sprays into both nostrils daily. Shake well before use. Gently blow nose before spraying. Do not blow nose immediately after use. You should not taste the medication or feel it going down your throat; if you do, adjust your technique. 07/08/24  Yes Iola Lukes, FNP  Accu-Chek Softclix Lancets lancets 1 each 3 (three) times daily. 02/06/21   [provider]  atorvastatin  (LIPITOR) 20 MG tablet Take 1 tablet (20 mg total) by mouth daily. 04/12/24   Dea Shiner, MD  Continuous Glucose Sensor (FREESTYLE LIBRE 3 SENSOR) MISC Place 1 sensor on the skin every 14 days. Use to check glucose continuously Patient not taking: Reported on 03/28/2024 09/22/23   [name], [name], FNP  cyclobenzaprine  (FLEXERIL ) 10 MG tablet Take 1 tablet (10 mg total) by mouth at bedtime. 12/27/23   Rising, Asberry, PA-C  gabapentin  (NEURONTIN ) 100 MG capsule Take 1 capsule (100 mg total) by mouth 3 (three) times daily. 03/28/24   [name], [name], FNP  GENVOYA  150-150-200-10 MG TABS tablet TAKE 1 TABLET BY MOUTH DAILY WITH BREAKFAST. 06/20/24   Dea Shiner, MD  GLOBAL EASE INJECT PEN NEEDLES 32G X 4 MM MISC  11/15/21   [provider]  glucose blood (ACCU-CHEK GUIDE TEST) test strip 1 each by Other route in the morning and at bedtime. 02/29/24   [name], [name], FNP  glucose blood test strip Check your sugar in the morning before you eat breakfast, and one hour after a meal. 05/26/19   Van Knee, MD  ibuprofen  (ADVIL ) 800 MG tablet Take 1 tablet (800 mg total) by mouth 3  (three) times daily. 12/27/23   Rising, Asberry, PA-C  insulin  glargine-yfgn (SEMGLEE ) 100 UNIT/ML Pen Inject 40 Units into the skin 2 (two) times daily. 03/28/24   [name], [name], FNP  omeprazole  (PRILOSEC  OTC) 20 MG tablet Take 1 tablet (20 mg total) by mouth daily. 06/12/24 06/12/25  Flint Sonny POUR, PA-C  RESTASIS 0.05 % ophthalmic emulsion  11/06/22   [provider]  Semaglutide , 2 MG/DOSE, (OZEMPIC , 2 MG/DOSE,) 8 MG/3ML SOPN INJECT TWO MG AS DIRECTED ONCE A WEEK. 06/28/24   [name], [name], FNP  tranexamic acid  (LYSTEDA ) 650 MG TABS tablet TAKE TWO TABLETS BY MOUTH THREE TIMES A DAY . TAKE DURING MENSES FOR A MAX OF 5 DAYS 03/29/24   Zina Jerilynn LABOR, MD  cetirizine  (ZYRTEC  ALLERGY) 10 MG tablet Take 1 tablet (10 mg total) by  mouth daily. Patient not taking: Reported on 05/27/2020 11/26/19 11/08/20  Henderly, Britni A, PA-C    Family History Family History  Problem Relation Age of Onset   Healthy Mother    Healthy Father    Breast cancer Neg Hx     Social History Social History   Tobacco Use   Smoking status: Never   Smokeless tobacco: Never  Vaping Use   Vaping status: Never Used  Substance Use Topics   Alcohol use: No   Drug use: Not Currently    Types: Marijuana    Comment: CBD     Allergies   Patient has no known allergies.   Review of Systems Review of Systems  Constitutional:  Negative for chills and fever.  HENT:  Positive for congestion, rhinorrhea, sneezing and sore throat (mild).   Respiratory:  Positive for cough. Negative for shortness of breath and wheezing.   Gastrointestinal:  Negative for diarrhea, nausea and vomiting.  Musculoskeletal:  Positive for myalgias (mild).  Neurological:  Positive for headaches (mild).  All other systems reviewed and are negative.    Physical Exam Triage Vital Signs ED Triage Vitals  Encounter Vitals Group     BP 07/08/24 1116 138/86     Girls Systolic BP Percentile --      Girls Diastolic BP  Percentile --      Boys Systolic BP Percentile --      Boys Diastolic BP Percentile --      Pulse Rate 07/08/24 1116 95     Resp 07/08/24 1116 18     Temp 07/08/24 1116 99.1 F (37.3 C)     Temp Source 07/08/24 1116 Oral     SpO2 07/08/24 1116 98 %     Weight --      Height --      Head Circumference --      Peak Flow --      Pain Score 07/08/24 1115 0     Pain Loc --      Pain Education --      Exclude from Growth Chart --    No data found.  Updated Vital Signs BP 138/86 (BP Location: Right Arm)   Pulse 95   Temp 99.1 F (37.3 C) (Oral)   Resp 18   LMP  (Approximate)   SpO2 98%   Visual Acuity Right Eye Distance:   Left Eye Distance:   Bilateral Distance:    Right Eye Near:   Left Eye Near:    Bilateral Near:     Physical Exam Vitals reviewed.  Constitutional:      General: She is awake. She is not in acute distress.    Appearance: Normal appearance. She is well-developed. She is not ill-appearing, toxic-appearing or diaphoretic.  HENT:     Head: Normocephalic.     Right Ear: Tympanic membrane, ear canal and external ear normal. No drainage, swelling or tenderness. No middle ear effusion. Tympanic membrane is not erythematous.     Left Ear: Tympanic membrane, ear canal and external ear normal. No drainage, swelling or tenderness.  No middle ear effusion. Tympanic membrane is not erythematous.     Nose: Congestion present. No rhinorrhea.     Mouth/Throat:     Lips: Pink.     Mouth: Mucous membranes are moist.     Pharynx: No pharyngeal swelling, oropharyngeal exudate, posterior oropharyngeal erythema or uvula swelling.     Tonsils: No tonsillar exudate or tonsillar abscesses.  Eyes:  General: Vision grossly intact.     Conjunctiva/sclera: Conjunctivae normal.  Cardiovascular:     Rate and Rhythm: Normal rate.     Heart sounds: Normal heart sounds.  Pulmonary:     Effort: Pulmonary effort is normal. No tachypnea or respiratory distress.     Breath  sounds: Normal breath sounds and air entry.  Musculoskeletal:        General: Normal range of motion.     Cervical back: Normal range of motion and neck supple.  Lymphadenopathy:     Cervical: No cervical adenopathy.  Skin:    General: Skin is warm and dry.  Neurological:     General: No focal deficit present.     Mental Status: She is alert and oriented to person, place, and time.  Psychiatric:        Behavior: Behavior is cooperative.      UC Treatments / Results  Labs (all labs ordered are listed, but only abnormal results are displayed) Labs Reviewed  POCT INFLUENZA A/B  POC SOFIA SARS ANTIGEN FIA    EKG   Radiology No results found.  Procedures Procedures (including critical care time)  Medications Ordered in UC Medications - No data to display  Initial Impression / Assessment and Plan / UC Course  I have reviewed the triage vital signs and the nursing notes.  Pertinent labs & imaging results that were available during my care of the patient were reviewed by me and considered in my medical decision making (see chart for details).     The patient presents with symptoms consistent with a viral upper respiratory infection. Flu & COVID testing was negative. Exam is reassuring and no evidence of bacterial infection or acute cardiopulmonary process is noted. Supportive care is recommended. Patient was advised to follow up with primary care if symptoms do not improve within one week or if new concerns arise. Instructions were given to seek emergency care if symptoms worsen, including shortness of breath, chest pain, persistent high fever, inability to tolerate fluids, or confusion.  Today's evaluation has revealed no signs of a dangerous process. Discussed diagnosis with patient and/or guardian. Patient and/or guardian aware of their diagnosis, possible red flag symptoms to watch out for and need for close follow up. Patient and/or guardian understands verbal and written  discharge instructions. Patient and/or guardian comfortable with plan and disposition.  Patient and/or guardian has a clear mental status at this time, good insight into illness (after discussion and teaching) and has clear judgment to make decisions regarding their care  Documentation was completed with the aid of voice recognition software. Transcription may contain typographical errors.   Final Clinical Impressions(s) / UC Diagnoses   Final diagnoses:  Acute cough  Viral URI with cough     Discharge Instructions      Your FLU & COVID testing were both negative. Your symptoms are most likely due to a respiratory infection affecting your nose, throat, or lungs. These infections are usually caused by a virus, which means antibiotics will not help since they only treat bacterial infections. Please take any medications prescribed to you as directed.  Several over-the-counter medicines can make you more comfortable. Acetaminophen  (Tylenol ) or ibuprofen  (Advil , Motrin ) can help with fever, body aches, or sore throat pain. Nasal saline sprays or rinses can be used often to keep your nose clear. Sore throat discomfort may improve with lozenges, menthol or benzocaine sprays, or warm saltwater gargles. These medicines do not cure the virus but can help  you feel better as your body recovers. It is important to drink plenty of fluids to stay hydrated and help thin mucus. Aim for urine that is pale yellow. Using a cool mist humidifier at home, inhaling steam several times a day, and avoiding cool or dry air may also ease congestion. Sleeping with your head elevated can reduce post-nasal drainage, and getting enough rest each night supports recovery. Remember to replace your toothbrush once you start feeling better.  A cough may last for several weeks after a respiratory illness even when other symptoms resolve, as the airways remain irritated. As long as the cough gradually improves and no new concerning  symptoms appear, this is part of normal healing.  If your symptoms worsen or you develop new problems such as trouble breathing, chest pain, or high fever, go to the emergency room right away.           ED Prescriptions     Medication Sig Dispense Auth. Provider   brompheniramine-pseudoephedrine -DM 30-2-10 MG/5ML syrup Take 10 mLs by mouth every 6 (six) hours as needed (cough and congestion). 120 mL Iola Lukes, FNP   cetirizine -pseudoephedrine  (ZYRTEC -D) 5-120 MG tablet Take 1 tablet by mouth daily with breakfast for 10 days. 10 tablet Iola Lukes, FNP   fluticasone  (FLONASE ) 50 MCG/ACT nasal spray Place 2 sprays into both nostrils daily. Shake well before use. Gently blow nose before spraying. Do not blow nose immediately after use. You should not taste the medication or feel it going down your throat; if you do, adjust your technique. 16 g Iola Lukes, FNP      PDMP not reviewed this encounter.   Iola Lukes, [place] 07/08/24 [phone]

## 2024-07-08 NOTE — Discharge Instructions (Addendum)
 Your FLU & COVID testing were both negative. Your symptoms are most likely due to a respiratory infection affecting your nose, throat, or lungs. These infections are usually caused by a virus, which means antibiotics will not help since they only treat bacterial infections. Please take any medications prescribed to you as directed.  Several over-the-counter medicines can make you more comfortable. Acetaminophen  (Tylenol ) or ibuprofen  (Advil , Motrin ) can help with fever, body aches, or sore throat pain. Nasal saline sprays or rinses can be used often to keep your nose clear. Sore throat discomfort may improve with lozenges, menthol or benzocaine sprays, or warm saltwater gargles. These medicines do not cure the virus but can help you feel better as your body recovers. It is important to drink plenty of fluids to stay hydrated and help thin mucus. Aim for urine that is pale yellow. Using a cool mist humidifier at home, inhaling steam several times a day, and avoiding cool or dry air may also ease congestion. Sleeping with your head elevated can reduce post-nasal drainage, and getting enough rest each night supports recovery. Remember to replace your toothbrush once you start feeling better.  A cough may last for several weeks after a respiratory illness even when other symptoms resolve, as the airways remain irritated. As long as the cough gradually improves and no new concerning symptoms appear, this is part of normal healing.  If your symptoms worsen or you develop new problems such as trouble breathing, chest pain, or high fever, go to the emergency room right away.

## 2024-07-08 NOTE — ED Triage Notes (Signed)
 Pt reports Thursday felt cold coming on. Yesterday at work was sneezing and coughing so sent home. Reports can't sleep at night due to nasal congestion. Alka seltzer plus and green tea.

## 2024-07-10 NOTE — Progress Notes (Signed)
 The 10-year ASCVD risk score (Arnett DK, et al., 2019) is: 3%   Values used to calculate the score:     Age: 48 years     Clincally relevant sex: Female     Is Non-Hispanic African American: Yes     Diabetic: Yes     Tobacco smoker: No     Systolic Blood Pressure: 138 mmHg     Is BP treated: No     HDL Cholesterol: 52 mg/dL     Total Cholesterol: 130 mg/dL  Currently prescribed atorvastatin  20 mg.   Zaquan Duffner, BSN, RN

## 2024-07-12 ENCOUNTER — Other Ambulatory Visit: Payer: Self-pay | Admitting: Infectious Diseases

## 2024-07-12 DIAGNOSIS — B20 Human immunodeficiency virus [HIV] disease: Secondary | ICD-10-CM

## 2024-07-12 NOTE — Telephone Encounter (Signed)
 Called Rafeef to notify her that she cannot take Flonase  with her Genvoya , no answer. Left HIPAA compliant voicemail requesting callback.   Called home number, a female answered and states she is at work. He will ask her to call us  back.   Destenie Ingber, BSN, RN

## 2024-07-17 NOTE — Telephone Encounter (Signed)
 Second attempt to reach Janielle, no answer. Left HIPAA compliant voicemail requesting callback.   Spoke with Amy at [facility] and notified her of DDI between Genvoya  and Flonase . Asked her to please have the patient call our office to discuss before we can refill.   Terald Jump, BSN, RN

## 2024-07-19 NOTE — Telephone Encounter (Signed)
 Spoke with patient, discussed DDI between Genvoya  and Flonase  with potential for adrenal insufficiency and Cushing Syndrome. Counseled her to stop Flonase  and contact her PCP for an alternative. Patient verbalized understanding and has no further questions.   Elliet Goodnow, BSN, RN

## 2024-07-21 ENCOUNTER — Other Ambulatory Visit (HOSPITAL_COMMUNITY): Payer: Self-pay

## 2024-07-26 ENCOUNTER — Encounter: Payer: Self-pay | Admitting: Nurse Practitioner

## 2024-07-26 ENCOUNTER — Ambulatory Visit (INDEPENDENT_AMBULATORY_CARE_PROVIDER_SITE_OTHER): Payer: Self-pay | Admitting: Nurse Practitioner

## 2024-07-26 VITALS — BP 128/78 | HR 95 | Resp 16 | Wt 271.0 lb

## 2024-07-26 DIAGNOSIS — E1140 Type 2 diabetes mellitus with diabetic neuropathy, unspecified: Secondary | ICD-10-CM | POA: Diagnosis not present

## 2024-07-26 DIAGNOSIS — E785 Hyperlipidemia, unspecified: Secondary | ICD-10-CM | POA: Diagnosis not present

## 2024-07-26 DIAGNOSIS — Z0000 Encounter for general adult medical examination without abnormal findings: Secondary | ICD-10-CM

## 2024-07-26 DIAGNOSIS — Z6841 Body Mass Index (BMI) 40.0 and over, adult: Secondary | ICD-10-CM | POA: Diagnosis not present

## 2024-07-26 DIAGNOSIS — Z1211 Encounter for screening for malignant neoplasm of colon: Secondary | ICD-10-CM | POA: Insufficient documentation

## 2024-07-26 DIAGNOSIS — E1165 Type 2 diabetes mellitus with hyperglycemia: Secondary | ICD-10-CM | POA: Diagnosis not present

## 2024-07-26 DIAGNOSIS — K5900 Constipation, unspecified: Secondary | ICD-10-CM | POA: Insufficient documentation

## 2024-07-26 DIAGNOSIS — K219 Gastro-esophageal reflux disease without esophagitis: Secondary | ICD-10-CM | POA: Diagnosis not present

## 2024-07-26 LAB — POCT GLYCOSYLATED HEMOGLOBIN (HGB A1C): HbA1c, POC (controlled diabetic range): 8.7 % — AB (ref 0.0–7.0)

## 2024-07-26 MED ORDER — LINACLOTIDE 145 MCG PO CAPS: 145.0000 ug | ORAL_CAPSULE | Freq: Every day | ORAL | 2 refills | Status: DC

## 2024-07-26 MED ORDER — INSULIN GLARGINE-YFGN 100 UNIT/ML ~~LOC~~ SOPN: 34.0000 [IU] | PEN_INJECTOR | Freq: Every day | SUBCUTANEOUS | 4 refills | Status: AC

## 2024-07-26 MED ORDER — OMEPRAZOLE MAGNESIUM 20 MG PO TBEC: 20.0000 mg | DELAYED_RELEASE_TABLET | Freq: Every day | ORAL | 2 refills | Status: AC

## 2024-07-26 MED ORDER — FREESTYLE LIBRE 3 SENSOR MISC: 1.0000 | 3 refills | Status: AC | PRN

## 2024-07-26 NOTE — Assessment & Plan Note (Signed)
°  General health maintenance Routine screenings and preventive care discussed. Pap smear is up to date. Mammogram and eye exam are due. - Provided phone number for mammogram scheduling. - Advised scheduling an eye exam if not done in the past year Declined influenza vaccine

## 2024-07-26 NOTE — Assessment & Plan Note (Signed)
 Chronic constipation Difficulty using the bathroom, requiring significant effort. Previous use of castor oil and Miralax  without relief. - Prescribed Linzess,. - Referred to gastroenterologist for further evaluation. - Encouraged hydration with at least 64 ounces of water daily. - Encouraged intake of vegetables and fruits. - Encouraged regular exercise to aid bowel movements.  SABRA

## 2024-07-26 NOTE — Patient Instructions (Addendum)
 Goal for fasting blood sugar ranges from 80 to 120 and 2 hours after any meal or at bedtime should be between 130 to 170.   Please start Semglee  34 units daily, after 4 days if your fasting blood sugar remains greater than 130 please increase Semglee  to 38 units daily  Continue Ozempic  2 mg once weekly  1. Uncontrolled type 2 diabetes mellitus with hyperglycemia (HCC) - POCT glycosylated hemoglobin (Hb A1C) - insulin  glargine-yfgn (SEMGLEE ) 100 UNIT/ML Pen; Inject 34 Units into the skin daily.  Dispense: 15 mL; Refill: 4  2. Gastroesophageal reflux disease, unspecified whether esophagitis present - omeprazole  (PRILOSEC  OTC) 20 MG tablet; Take 1 tablet (20 mg total) by mouth daily.  Dispense: 30 tablet; Refill: 2 - Ambulatory referral to Gastroenterology  3. Constipation, unspecified constipation type - linaclotide (LINZESS) 145 MCG CAPS capsule; Take 1 capsule (145 mcg total) by mouth daily before breakfast.  Dispense: 60 capsule; Refill: 2 - Ambulatory referral to Gastroenterology  4. Screening for colon cancer (Primary) - Ambulatory referral to Gastroenterology     It is important that you exercise regularly at least 30 minutes 5 times a week as tolerated  Think about what you will eat, plan ahead. Choose  clean, green, fresh or frozen over canned, processed or packaged foods which are more sugary, salty and fatty. 70 to 75% of food eaten should be vegetables and fruit. Three meals at set times with snacks allowed between meals, but they must be fruit or vegetables. Aim to eat over a 12 hour period , example 7 am to 7 pm, and STOP after  your last meal of the day. Drink water,generally about 64 ounces per day, no other drink is as healthy. Fruit juice is best enjoyed in a healthy way, by EATING the fruit.  Thanks for choosing [facility] we consider it a privelige to serve you.

## 2024-07-26 NOTE — Assessment & Plan Note (Signed)
 Wt Readings from Last 3 Encounters:  07/26/24 271 lb (122.9 kg)  06/12/24 275 lb (124.7 kg)  03/28/24 284 lb (128.8 kg)   Body mass index is 41.21 kg/m.  Counseled on low-carb diet Encouraged regular moderate to vigorous exercise at least 150 minutes weekly as tolerated On Ozempic  2 mg once weekly for type 2 diabetes

## 2024-07-26 NOTE — Assessment & Plan Note (Signed)
 Lab Results  Component Value Date   CHOL 130 12/24/2023   HDL 52 12/24/2023   LDLCALC 61 12/24/2023   TRIG 83 12/24/2023   CHOLHDL 2.5 12/24/2023  On atorvastatin  20 mg daily Will recheck lipid panel at next visit

## 2024-07-26 NOTE — Progress Notes (Signed)
 Established Patient Office Visit  Subjective:  Patient ID: Beverly Johnson, female    DOB: 1976/03/28  Age: 48 y.o. MRN: [id number]  CC:  Chief Complaint  Patient presents with   Diabetes   Heartburn    HPI    Discussed the use of AI scribe software for clinical note transcription with the patient, who gave verbal consent to proceed.  History of Present Illness Beverly Johnson is a 48 year old female  has a past medical history of Back pain, DM2 (diabetes mellitus, type 2) (HCC), HIV infection (HCC), Human immunodeficiency virus (HIV) disease (HCC) (09/29/2006), Menorrhagia (11/05/2022), Microalbuminuria due to type 2 diabetes mellitus (HCC), Obesity (04/27/2012), Primary hypertension (03/02/2023), Uncontrolled type 2 diabetes mellitus with hyperglycemia (HCC) (03/02/2023), and Vitamin D  deficiency (03/21/2017).  who presents for management of her diabetes and gastrointestinal symptoms.  She manages her diabetes with Ozempic  2 mg once weekly and Semglee  30 units daily. Her A1c has improved from 10.7% four months ago to 8.7% currently. She reports trying to slow down her eating. Her blood sugar levels range from 160 to 232 mg/dL, with the highest being a fasting level. Despite working seven days a week, she finds it challenging to incorporate additional exercise into her routine.  She experiences significant indigestion, with pain more pronounced in her chest than her throat. She takes omeprazole  as needed for indigestion. She also suffers from constipation, describing difficulty in bowel movements and the need to force herself to defecate. Attempts to alleviate this with castor oil and Miralax  have been unsuccessful. Her bowel habits change around her menstrual cycle.  . She has a colon cancer screening kit at home but has not completed the test. She is unsure when her last diabetic eye exam and Pap smear were performed, but she wears glasses and has a history of dry eyes.   Assessment &  Plan     Past Medical History:  Diagnosis Date   Back pain    DM2 (diabetes mellitus, type 2) (HCC)    HIV infection (HCC)    Human immunodeficiency virus (HIV) disease (HCC) 09/29/2006   Qualifier: Diagnosis of   By: Janna MD, Kelly         Menorrhagia 11/05/2022   Microalbuminuria due to type 2 diabetes mellitus (HCC)    Obesity 04/27/2012   Current weight 279lb 04/27/12     Primary hypertension 03/02/2023   Uncontrolled type 2 diabetes mellitus with hyperglycemia (HCC) 03/02/2023   Vitamin D  deficiency 03/21/2017    Past Surgical History:  Procedure Laterality Date   CESAREAN SECTION     x3   CHOLECYSTECTOMY  06/29/2011   Procedure: LAPAROSCOPIC CHOLECYSTECTOMY WITH INTRAOPERATIVE CHOLANGIOGRAM;  Surgeon: Deward GORMAN Curvin DOUGLAS, MD;  Location: [facility] OR;  Service: General;  Laterality: N/A;   TUBAL LIGATION      Family History  Problem Relation Age of Onset   Healthy Mother    Healthy Father    Breast cancer Neg Hx     Social History   Socioeconomic History   Marital status: Married    Spouse name: Not on file   Number of children: Not on file   Years of education: 10   Highest education level: Not on file  Occupational History    Employer: UNEMPLOYED  Tobacco Use   Smoking status: Never   Smokeless tobacco: Never  Vaping Use   Vaping status: Never Used  Substance and Sexual Activity   Alcohol use: No   Drug use:  Not Currently    Types: Marijuana    Comment: CBD   Sexual activity: Yes    Partners: Male    Birth control/protection: None  Other Topics Concern   Not on file  Social History Narrative   Not on file   Social Drivers of Health   Financial Resource Strain: Not on file  Food Insecurity: No Food Insecurity (02/24/2021)   Hunger Vital Sign    Worried About Running Out of Food in the Last Year: Never true    Ran Out of Food in the Last Year: Never true  Transportation Needs: No Transportation Needs (02/24/2021)   PRAPARE - Scientist, Research (physical Sciences) (Medical): No    Lack of Transportation (Non-Medical): No  Physical Activity: Not on file  Stress: Not on file  Social Connections: Not on file  Intimate Partner Violence: Not on file    Outpatient Medications Prior to Visit  Medication Sig Dispense Refill   atorvastatin  (LIPITOR) 20 MG tablet Take 1 tablet (20 mg total) by mouth daily. 90 tablet 0   gabapentin  (NEURONTIN ) 100 MG capsule Take 1 capsule (100 mg total) by mouth 3 (three) times daily. 90 capsule 3   GENVOYA  150-150-200-10 MG TABS tablet TAKE 1 TABLET BY MOUTH DAILY WITH BREAKFAST. 30 tablet 0   Semaglutide , 2 MG/DOSE, (OZEMPIC , 2 MG/DOSE,) 8 MG/3ML SOPN INJECT TWO MG AS DIRECTED ONCE A WEEK. 6 mL 2   omeprazole  (PRILOSEC  OTC) 20 MG tablet Take 1 tablet (20 mg total) by mouth daily. 28 tablet 1   Accu-Chek Softclix Lancets lancets 1 each 3 (three) times daily.     brompheniramine-pseudoephedrine -DM 30-2-10 MG/5ML syrup Take 10 mLs by mouth every 6 (six) hours as needed (cough and congestion). 120 mL 0   Continuous Glucose Sensor (FREESTYLE LIBRE 3 SENSOR) MISC Place 1 sensor on the skin every 14 days. Use to check glucose continuously (Patient not taking: Reported on 03/28/2024) 2 each 3   cyclobenzaprine  (FLEXERIL ) 10 MG tablet Take 1 tablet (10 mg total) by mouth at bedtime. (Patient not taking: Reported on 07/26/2024) 20 tablet 0   fluticasone  (FLONASE ) 50 MCG/ACT nasal spray Place 2 sprays into both nostrils daily. Shake well before use. Gently blow nose before spraying. Do not blow nose immediately after use. You should not taste the medication or feel it going down your throat; if you do, adjust your technique. 16 g 0   GLOBAL EASE INJECT PEN NEEDLES 32G X 4 MM MISC      glucose blood (ACCU-CHEK GUIDE TEST) test strip 1 each by Other route in the morning and at bedtime. 100 each 4   glucose blood test strip Check your sugar in the morning before you eat breakfast, and one hour after a meal. 100 each 2    ibuprofen  (ADVIL ) 800 MG tablet Take 1 tablet (800 mg total) by mouth 3 (three) times daily. 15 tablet 0   RESTASIS 0.05 % ophthalmic emulsion      tranexamic acid  (LYSTEDA ) 650 MG TABS tablet TAKE TWO TABLETS BY MOUTH THREE TIMES A DAY . TAKE DURING MENSES FOR A MAX OF 5 DAYS 30 tablet 4   insulin  glargine-yfgn (SEMGLEE ) 100 UNIT/ML Pen Inject 40 Units into the skin 2 (two) times daily. 15 mL 4   No facility-administered medications prior to visit.    No Known Allergies  ROS Review of Systems  Constitutional:  Negative for appetite change, chills, fatigue and fever.  HENT:  Negative for congestion,  postnasal drip, rhinorrhea and sneezing.   Respiratory:  Negative for cough, shortness of breath and wheezing.   Cardiovascular:  Negative for chest pain, palpitations and leg swelling.  Gastrointestinal:  Negative for abdominal pain, constipation, nausea and vomiting.  Genitourinary:  Negative for difficulty urinating, dysuria, flank pain and frequency.  Musculoskeletal:  Negative for arthralgias, back pain, joint swelling and myalgias.  Skin:  Negative for color change, pallor, rash and wound.  Neurological:  Negative for dizziness, facial asymmetry, weakness, numbness and headaches.  Psychiatric/Behavioral:  Negative for behavioral problems, confusion, self-injury and suicidal ideas.       Objective:    Physical Exam Vitals and nursing note reviewed.  Constitutional:      General: She is not in acute distress.    Appearance: Normal appearance. She is obese. She is not ill-appearing, toxic-appearing or diaphoretic.  Eyes:     General: No scleral icterus.       Right eye: No discharge.        Left eye: No discharge.     Extraocular Movements: Extraocular movements intact.     Conjunctiva/sclera: Conjunctivae normal.  Cardiovascular:     Rate and Rhythm: Normal rate and regular rhythm.     Pulses: Normal pulses.     Heart sounds: Normal heart sounds. No murmur heard.    No  friction rub. No gallop.  Pulmonary:     Effort: Pulmonary effort is normal. No respiratory distress.     Breath sounds: Normal breath sounds. No stridor. No wheezing, rhonchi or rales.  Chest:     Chest wall: No tenderness.  Abdominal:     General: There is no distension.     Palpations: Abdomen is soft.     Tenderness: There is no abdominal tenderness. There is no right CVA tenderness, left CVA tenderness or guarding.  Musculoskeletal:        General: No swelling, tenderness, deformity or signs of injury.     Right lower leg: No edema.     Left lower leg: No edema.  Skin:    General: Skin is warm and dry.     Capillary Refill: Capillary refill takes less than 2 seconds.     Coloration: Skin is not jaundiced or pale.     Findings: No bruising, erythema or lesion.  Neurological:     Mental Status: She is alert and oriented to person, place, and time.     Motor: No weakness.     Gait: Gait normal.  Psychiatric:        Mood and Affect: Mood normal.        Behavior: Behavior normal.        Thought Content: Thought content normal.        Judgment: Judgment normal.     BP 128/78   Pulse 95   Resp 16   Wt 271 lb (122.9 kg)   SpO2 100%   BMI 41.21 kg/m  Wt Readings from Last 3 Encounters:  07/26/24 271 lb (122.9 kg)  06/12/24 275 lb (124.7 kg)  03/28/24 284 lb (128.8 kg)    Lab Results  Component Value Date   TSH 1.105 06/17/2019   Lab Results  Component Value Date   WBC 4.1 06/12/2024   HGB 9.8 (L) 06/12/2024   HCT 32.9 (L) 06/12/2024   MCV 76.0 (L) 06/12/2024   PLT 161 06/12/2024   Lab Results  Component Value Date   NA 136 06/12/2024   K 3.7 06/12/2024   CO2  25 06/12/2024   GLUCOSE 228 (H) 06/12/2024   BUN 9 06/12/2024   CREATININE 0.77 06/12/2024   BILITOT 0.5 06/12/2024   ALKPHOS 110 06/12/2024   AST 28 06/12/2024   ALT 11 06/12/2024   PROT 8.2 (H) 06/12/2024   ALBUMIN 3.7 06/12/2024   CALCIUM  9.0 06/12/2024   ANIONGAP 5 06/12/2024   EGFR 111  12/24/2023   Lab Results  Component Value Date   CHOL 130 12/24/2023   Lab Results  Component Value Date   HDL 52 12/24/2023   Lab Results  Component Value Date   LDLCALC 61 12/24/2023   Lab Results  Component Value Date   TRIG 83 12/24/2023   Lab Results  Component Value Date   CHOLHDL 2.5 12/24/2023   Lab Results  Component Value Date   HGBA1C 8.7 (A) 07/26/2024      Assessment & Plan:   Problem List Items Addressed This Visit       Digestive   Gastroesophageal reflux disease   Gastroesophageal reflux disease Experiencing indigestion and chest pain, likely related to GERD. Symptoms persist despite dietary modifications. - Advised taking omeprazole  daily instead of as needed. - Referred to gastroenterologist for further evaluation. - Encouraged dietary modifications to avoid fatty, fried, and spicy foods. - Advised eating smaller portions and avoiding eating 2-3 hours before lying down.        Relevant Medications   omeprazole  (PRILOSEC  OTC) 20 MG tablet   linaclotide (LINZESS) 145 MCG CAPS capsule   Other Relevant Orders   Ambulatory referral to Gastroenterology     Endocrine   Uncontrolled type 2 diabetes mellitus with hyperglycemia (HCC)   Lab Results  Component Value Date   HGBA1C 8.7 (A) 07/26/2024   Type 2 diabetes mellitus with hyperglycemia A1c improved from 10.7% to 8.7% over four months. Current regimen includes Semglee  30 units daily and Ozempic  2 mg weekly. Fasting blood sugar was 232 mg/dL this morning. Goal is to reduce A1c to less than 7% to reduce risk of stroke and heart attack. - Increased Semglee  to 34 units daily. - If fasting blood sugar is greater than 130 mg/dL, add 4 units to Semglee . - Encouraged dietary modifications to reduce carbohydrate intake. - Encouraged moderate to vigorous exercise 30 minutes, 5 days a week. - will order Freestyle libre Not interested in taking pills       Relevant Medications   insulin   glargine-yfgn (SEMGLEE ) 100 UNIT/ML Pen   Other Relevant Orders   POCT glycosylated hemoglobin (Hb A1C) (Completed)   Neuropathy due to type 2 diabetes mellitus (HCC)   Continue gabapentin  100 mg 3 times daily      Relevant Medications   insulin  glargine-yfgn (SEMGLEE ) 100 UNIT/ML Pen     Other   Healthcare maintenance (Chronic)    General health maintenance Routine screenings and preventive care discussed. Pap smear is up to date. Mammogram and eye exam are due. - Provided phone number for mammogram scheduling. - Advised scheduling an eye exam if not done in the past year Declined influenza vaccine      Morbid obesity with BMI of 45.0-49.9, adult (HCC)   Wt Readings from Last 3 Encounters:  07/26/24 271 lb (122.9 kg)  06/12/24 275 lb (124.7 kg)  03/28/24 284 lb (128.8 kg)   Body mass index is 41.21 kg/m.  Counseled on low-carb diet Encouraged regular moderate to vigorous exercise at least 150 minutes weekly as tolerated On Ozempic  2 mg once weekly for type 2 diabetes  Relevant Medications   insulin  glargine-yfgn (SEMGLEE ) 100 UNIT/ML Pen   Dyslipidemia, goal LDL below 70   Lab Results  Component Value Date   CHOL 130 12/24/2023   HDL 52 12/24/2023   LDLCALC 61 12/24/2023   TRIG 83 12/24/2023   CHOLHDL 2.5 12/24/2023  On atorvastatin  20 mg daily Will recheck lipid panel at next visit        Screening for colon cancer - Primary   Relevant Orders   Ambulatory referral to Gastroenterology   Constipation   Chronic constipation Difficulty using the bathroom, requiring significant effort. Previous use of castor oil and Miralax  without relief. - Prescribed Linzess,. - Referred to gastroenterologist for further evaluation. - Encouraged hydration with at least 64 ounces of water daily. - Encouraged intake of vegetables and fruits. - Encouraged regular exercise to aid bowel movements.  .      Relevant Medications   linaclotide (LINZESS) 145 MCG CAPS capsule    Other Relevant Orders   Ambulatory referral to Gastroenterology    Meds ordered this encounter  Medications   insulin  glargine-yfgn (SEMGLEE ) 100 UNIT/ML Pen    Sig: Inject 34 Units into the skin daily.    Dispense:  15 mL    Refill:  4   omeprazole  (PRILOSEC  OTC) 20 MG tablet    Sig: Take 1 tablet (20 mg total) by mouth daily.    Dispense:  30 tablet    Refill:  2   linaclotide (LINZESS) 145 MCG CAPS capsule    Sig: Take 1 capsule (145 mcg total) by mouth daily before breakfast.    Dispense:  60 capsule    Refill:  2    Follow-up: Return in about 6 weeks (around 09/06/2024) for DM.    Jessica Seidman R Brolin Dambrosia, FNP

## 2024-07-26 NOTE — Assessment & Plan Note (Signed)
 Lab Results  Component Value Date   HGBA1C 8.7 (A) 07/26/2024   Type 2 diabetes mellitus with hyperglycemia A1c improved from 10.7% to 8.7% over four months. Current regimen includes Semglee  30 units daily and Ozempic  2 mg weekly. Fasting blood sugar was 232 mg/dL this morning. Goal is to reduce A1c to less than 7% to reduce risk of stroke and heart attack. - Increased Semglee  to 34 units daily. - If fasting blood sugar is greater than 130 mg/dL, add 4 units to Semglee . - Encouraged dietary modifications to reduce carbohydrate intake. - Encouraged moderate to vigorous exercise 30 minutes, 5 days a week. - will order Freestyle libre Not interested in taking pills

## 2024-07-26 NOTE — Assessment & Plan Note (Signed)
Continue gabapentin 100 mg 3 times daily. 

## 2024-07-26 NOTE — Assessment & Plan Note (Signed)
 Gastroesophageal reflux disease Experiencing indigestion and chest pain, likely related to GERD. Symptoms persist despite dietary modifications. - Advised taking omeprazole  daily instead of as needed. - Referred to gastroenterologist for further evaluation. - Encouraged dietary modifications to avoid fatty, fried, and spicy foods. - Advised eating smaller portions and avoiding eating 2-3 hours before lying down.

## 2024-07-27 ENCOUNTER — Other Ambulatory Visit: Payer: Self-pay

## 2024-07-28 ENCOUNTER — Telehealth: Payer: Self-pay

## 2024-07-28 NOTE — Telephone Encounter (Signed)
 Pharmacy Patient Advocate Encounter  Received notification from [employer] that Prior Authorization for FREESTYLE LIBRE 3 SENSOR has been APPROVED from 07/27/2024 to 01/23/2025   PA #/Case ID/Reference #: [id number]

## 2024-08-17 ENCOUNTER — Emergency Department (HOSPITAL_COMMUNITY)

## 2024-08-17 ENCOUNTER — Emergency Department (HOSPITAL_COMMUNITY)
Admission: EM | Admit: 2024-08-17 | Discharge: 2024-08-17 | Disposition: A | Discharge status: Home / Self Care | Source: Home / Self Care | Attending: Emergency Medicine | Admitting: Emergency Medicine

## 2024-08-17 DIAGNOSIS — M5441 Lumbago with sciatica, right side: Secondary | ICD-10-CM | POA: Diagnosis not present

## 2024-08-17 DIAGNOSIS — Z21 Asymptomatic human immunodeficiency virus [HIV] infection status: Secondary | ICD-10-CM | POA: Insufficient documentation

## 2024-08-17 DIAGNOSIS — M545 Low back pain, unspecified: Secondary | ICD-10-CM | POA: Diagnosis present

## 2024-08-17 DIAGNOSIS — D72819 Decreased white blood cell count, unspecified: Secondary | ICD-10-CM | POA: Diagnosis not present

## 2024-08-17 DIAGNOSIS — M5442 Lumbago with sciatica, left side: Secondary | ICD-10-CM | POA: Diagnosis not present

## 2024-08-17 DIAGNOSIS — M79606 Pain in leg, unspecified: Secondary | ICD-10-CM

## 2024-08-17 DIAGNOSIS — E1165 Type 2 diabetes mellitus with hyperglycemia: Secondary | ICD-10-CM | POA: Diagnosis not present

## 2024-08-17 DIAGNOSIS — Z794 Long term (current) use of insulin: Secondary | ICD-10-CM | POA: Insufficient documentation

## 2024-08-17 DIAGNOSIS — M79604 Pain in right leg: Secondary | ICD-10-CM

## 2024-08-17 DIAGNOSIS — D649 Anemia, unspecified: Secondary | ICD-10-CM | POA: Insufficient documentation

## 2024-08-17 DIAGNOSIS — N390 Urinary tract infection, site not specified: Secondary | ICD-10-CM | POA: Diagnosis not present

## 2024-08-17 DIAGNOSIS — M5430 Sciatica, unspecified side: Secondary | ICD-10-CM

## 2024-08-17 LAB — CBC WITH DIFFERENTIAL/PLATELET
Abs Immature Granulocytes: 0.01 K/uL (ref 0.00–0.07)
Basophils Absolute: 0 K/uL (ref 0.0–0.1)
Basophils Relative: 0 %
Eosinophils Absolute: 0 K/uL (ref 0.0–0.5)
Eosinophils Relative: 0 %
HCT: 32.1 % — ABNORMAL LOW (ref 36.0–46.0)
Hemoglobin: 9.6 g/dL — ABNORMAL LOW (ref 12.0–15.0)
Immature Granulocytes: 0 %
Lymphocytes Relative: 38 %
Lymphs Abs: 1.2 K/uL (ref 0.7–4.0)
MCH: 22.4 pg — ABNORMAL LOW (ref 26.0–34.0)
MCHC: 29.9 g/dL — ABNORMAL LOW (ref 30.0–36.0)
MCV: 74.8 fL — ABNORMAL LOW (ref 80.0–100.0)
Monocytes Absolute: 0.3 K/uL (ref 0.1–1.0)
Monocytes Relative: 9 %
Neutro Abs: 1.6 K/uL — ABNORMAL LOW (ref 1.7–7.7)
Neutrophils Relative %: 53 %
Platelets: 176 K/uL (ref 150–400)
RBC: 4.29 MIL/uL (ref 3.87–5.11)
RDW: 13.8 % (ref 11.5–15.5)
WBC: 3.1 K/uL — ABNORMAL LOW (ref 4.0–10.5)
nRBC: 0 % (ref 0.0–0.2)

## 2024-08-17 LAB — BASIC METABOLIC PANEL WITH GFR
Anion gap: 7 (ref 5–15)
BUN: 7 mg/dL (ref 6–20)
CO2: 24 mmol/L (ref 22–32)
Calcium: 9 mg/dL (ref 8.9–10.3)
Chloride: 105 mmol/L (ref 98–111)
Creatinine, Ser: 0.62 mg/dL (ref 0.44–1.00)
GFR, Estimated: >60 mL/min
Glucose, Bld: 196 mg/dL — ABNORMAL HIGH (ref 70–99)
Potassium: 3.7 mmol/L (ref 3.5–5.1)
Sodium: 137 mmol/L (ref 135–145)

## 2024-08-17 LAB — I-STAT CG4 LACTIC ACID, ED: Lactic Acid, Venous: 1 mmol/L (ref 0.5–1.9)

## 2024-08-17 LAB — URINALYSIS, ROUTINE W REFLEX MICROSCOPIC
Bilirubin Urine: NEGATIVE
Glucose, UA: 50 mg/dL — AB
Hgb urine dipstick: NEGATIVE
Ketones, ur: NEGATIVE mg/dL
Nitrite: POSITIVE — AB
Protein, ur: NEGATIVE mg/dL
Specific Gravity, Urine: 1.034 — ABNORMAL HIGH (ref 1.005–1.030)
pH: 6 (ref 5.0–8.0)

## 2024-08-17 LAB — PREGNANCY, URINE: Preg Test, Ur: NEGATIVE

## 2024-08-17 MED ORDER — OXYCODONE-ACETAMINOPHEN 5-325 MG PO TABS
1.0000 | ORAL_TABLET | Freq: Once | ORAL | Status: AC
  Administered 2024-08-17: 1 via ORAL
  Filled 2024-08-17: qty 1

## 2024-08-17 MED ORDER — METHOCARBAMOL 500 MG PO TABS
500.0000 mg | ORAL_TABLET | Freq: Once | ORAL | Status: AC
  Administered 2024-08-17: 500 mg via ORAL
  Filled 2024-08-17: qty 1

## 2024-08-17 MED ORDER — CEPHALEXIN 500 MG PO CAPS: 500.0000 mg | ORAL_CAPSULE | Freq: Four times a day (QID) | ORAL | 0 refills | Status: AC

## 2024-08-17 MED ORDER — LIDOCAINE 5 % EX PTCH
2.0000 | MEDICATED_PATCH | CUTANEOUS | Status: DC
  Administered 2024-08-17: 2 via TRANSDERMAL
  Filled 2024-08-17: qty 2

## 2024-08-17 MED ORDER — LORAZEPAM 1 MG PO TABS: 1.0000 mg | ORAL_TABLET | ORAL | Status: DC | PRN

## 2024-08-17 MED ORDER — SODIUM CHLORIDE 0.9 % IV SOLN: 1.0000 g | Freq: Once | INTRAVENOUS | Status: DC

## 2024-08-17 MED ORDER — GADOBUTROL 1 MMOL/ML IV SOLN
10.0000 mL | Freq: Once | INTRAVENOUS | Status: AC | PRN
  Administered 2024-08-17: 10 mL via INTRAVENOUS

## 2024-08-17 MED ORDER — SODIUM CHLORIDE 0.9 % IV SOLN
2.0000 g | Freq: Once | INTRAVENOUS | Status: AC
  Administered 2024-08-17: 2 g via INTRAVENOUS
  Filled 2024-08-17: qty 20

## 2024-08-17 NOTE — ED Triage Notes (Signed)
 Patient reports new onset bilateral leg pain from knees down x 2 weeks Denies injury  Sharp pain  Rated 10/10

## 2024-08-17 NOTE — Discharge Instructions (Addendum)
 Thank for letting us  evaluate you today.  Your labs are without abnormalities.  Your MRI did show some disc bulge.  Fortunately no signs of infection, spinal cord impingement, nor malignancy.  You may have some sciatica causing pain. Also, your urine does appear infected which could also be contributing.  We have given you a dose of Rocephin  here in emergency department as well as prescription for antibiotic for UTI.  I have sent a urine culture and we will call you if antibiotic needs to be changed. You are not pregnant  Return to Emergency Department if you experience worsening symptoms, loss of urinary continence where you urinate all over yourself, loss of sensation in genital region, worsening symptoms

## 2024-08-17 NOTE — ED Notes (Signed)
 Requested urine sample from patient, she said she couldn't provide sample. Will try again.

## 2024-08-17 NOTE — ED Notes (Signed)
 Pt ambulated to restroom unassisted to obtain urine specimen.

## 2024-08-17 NOTE — ED Provider Notes (Signed)
 " [place] EMERGENCY DEPARTMENT AT [facility] Provider Note   CSN: [id number] Arrival date & time: 08/17/24  1321     Patient presents with: Leg Pain   Beverly Johnson is a 49 y.o. female with past medical history of T2DM, HIV, HLD presents Emergency Department for evaluation of bilateral leg pain, lumbar back pain for past 2 weeks.  No known injury.  No fevers nor chills or past 2 weeks.  Leg pain described as pain going from top of thighs down to ankles.  Denies pedal edema, chest pain, shortness of breath, saddle paresthesia, urinary incontinence, urinary symptoms    Leg Pain Associated symptoms: back pain   Associated symptoms: no neck pain        Prior to Admission medications  Medication Sig Start Date End Date Taking? Authorizing Provider  cephALEXin  (KEFLEX ) 500 MG capsule Take 1 capsule (500 mg total) by mouth 4 (four) times daily. 08/17/24  Yes Minnie Tinnie BRAVO, PA  Accu-Chek Softclix Lancets lancets 1 each 3 (three) times daily. 02/06/21   [provider]  atorvastatin  (LIPITOR) 20 MG tablet Take 1 tablet (20 mg total) by mouth daily. 04/12/24   Manandhar, Sabina, MD  brompheniramine-pseudoephedrine -DM 30-2-10 MG/5ML syrup Take 10 mLs by mouth every 6 (six) hours as needed (cough and congestion). 07/08/24   Iola Lukes, FNP  Continuous Glucose Sensor (FREESTYLE LIBRE 3 SENSOR) MISC Place 1 sensor on the skin every 14 days. Use to check glucose continuously 07/26/24   [name], [name], FNP  cyclobenzaprine  (FLEXERIL ) 10 MG tablet Take 1 tablet (10 mg total) by mouth at bedtime. Patient not taking: Reported on 07/26/2024 12/27/23   Rising, Asberry, PA-C  fluticasone  (FLONASE ) 50 MCG/ACT nasal spray Place 2 sprays into both nostrils daily. Shake well before use. Gently blow nose before spraying. Do not blow nose immediately after use. You should not taste the medication or feel it going down your throat; if you do, adjust your technique. 07/08/24    Murrill, Samantha, FNP  gabapentin  (NEURONTIN ) 100 MG capsule Take 1 capsule (100 mg total) by mouth 3 (three) times daily. 03/28/24   [name], [name], FNP  GENVOYA  150-150-200-10 MG TABS tablet TAKE 1 TABLET BY MOUTH DAILY WITH BREAKFAST. 07/19/24   Dea Shiner, MD  GLOBAL EASE INJECT PEN NEEDLES 32G X 4 MM MISC  11/15/21   [provider]  glucose blood (ACCU-CHEK GUIDE TEST) test strip 1 each by Other route in the morning and at bedtime. 02/29/24   [name], [name], FNP  glucose blood test strip Check your sugar in the morning before you eat breakfast, and one hour after a meal. 05/26/19   Van Knee, MD  ibuprofen  (ADVIL ) 800 MG tablet Take 1 tablet (800 mg total) by mouth 3 (three) times daily. 12/27/23   Rising, Asberry, PA-C  insulin  glargine-yfgn (SEMGLEE ) 100 UNIT/ML Pen Inject 34 Units into the skin daily. 07/26/24   [name], [name], FNP  linaclotide  (LINZESS ) 145 MCG CAPS capsule Take 1 capsule (145 mcg total) by mouth daily before breakfast. 07/26/24   [name], [name], FNP  omeprazole  (PRILOSEC  OTC) 20 MG tablet Take 1 tablet (20 mg total) by mouth daily. 07/26/24 07/26/25  [name], [name], FNP  RESTASIS 0.05 % ophthalmic emulsion  11/06/22   [provider]  Semaglutide , 2 MG/DOSE, (OZEMPIC , 2 MG/DOSE,) 8 MG/3ML SOPN INJECT TWO MG AS DIRECTED ONCE A WEEK. 06/28/24   [name], [name], FNP  tranexamic acid  (LYSTEDA ) 650 MG TABS  tablet TAKE TWO TABLETS BY MOUTH THREE TIMES A DAY . TAKE DURING MENSES FOR A MAX OF 5 DAYS 03/29/24   Zina Jerilynn LABOR, MD  cetirizine  (ZYRTEC  ALLERGY) 10 MG tablet Take 1 tablet (10 mg total) by mouth daily. Patient not taking: Reported on 05/27/2020 11/26/19 11/08/20  Henderly, Britni A, PA-C    Allergies: Patient has no known allergies.    Review of Systems  Musculoskeletal:  Positive for back pain. Negative for neck pain.    Updated Vital Signs BP (!) 141/91   Pulse 79   Temp 99 F (37.2 C) (Oral)   Resp 16    Ht 5' 8 (1.727 m)   Wt 127 kg   SpO2 100%   BMI 42.57 kg/m   Physical Exam Vitals and nursing note reviewed.  Constitutional:      General: She is not in acute distress.    Appearance: Normal appearance.  HENT:     Head: Normocephalic and atraumatic.  Eyes:     Conjunctiva/sclera: Conjunctivae normal.  Cardiovascular:     Rate and Rhythm: Normal rate.     Pulses:          Dorsalis pedis pulses are 2+ on the right side and 2+ on the left side.  Pulmonary:     Effort: Pulmonary effort is normal. No respiratory distress.  Musculoskeletal:     Cervical back: No tenderness or bony tenderness.     Thoracic back: No tenderness or bony tenderness.     Lumbar back: Tenderness and bony tenderness present. Positive right straight leg raise test and positive left straight leg raise test.     Right lower leg: No edema.     Left lower leg: No edema.     Comments: Generalized tenderness to anterior and posterior thighs, calfs, and behind knees bilaterally. No gross swelling, erythema, warmth, lesions, wounds to BLE.  Skin:    Coloration: Skin is not jaundiced or pale.  Neurological:     Mental Status: She is alert and oriented to person, place, and time. Mental status is at baseline.     Comments: Motor 5/5 and sensation 2/2 of BLE     (all labs ordered are listed, but only abnormal results are displayed) Labs Reviewed  CBC WITH DIFFERENTIAL/PLATELET - Abnormal; Notable for the following components:      Result Value   WBC 3.1 (*)    Hemoglobin 9.6 (*)    HCT 32.1 (*)    MCV 74.8 (*)    MCH 22.4 (*)    MCHC 29.9 (*)    Neutro Abs 1.6 (*)    All other components within normal limits  BASIC METABOLIC PANEL WITH GFR - Abnormal; Notable for the following components:   Glucose, Bld 196 (*)    All other components within normal limits  URINALYSIS, ROUTINE W REFLEX MICROSCOPIC - Abnormal; Notable for the following components:   APPearance HAZY (*)    Specific Gravity, Urine 1.034  (*)    Glucose, UA 50 (*)    Nitrite POSITIVE (*)    Leukocytes,Ua TRACE (*)    Bacteria, UA RARE (*)    All other components within normal limits  URINE CULTURE  PREGNANCY, URINE  I-STAT CG4 LACTIC ACID, ED  I-STAT CG4 LACTIC ACID, ED    EKG: None  Radiology: MR Lumbar Spine W Wo Contrast Result Date: 08/17/2024 EXAM: MRI LUMBAR SPINE WITH AND WITHOUT INTRAVENOUS CONTRAST 08/17/2024 06:11:18 PM TECHNIQUE: Multiplanar multisequence MRI of the lumbar spine  was performed with and without the administration of intravenous contrast. COMPARISON: CT lumbar spine 11/13/2022. CLINICAL HISTORY: Low back pain, chronic (Ped 0-17y). FINDINGS: BONES AND ALIGNMENT: Normal alignment. Normal vertebral body heights. Bone marrow signal is unremarkable. There is no abnormal enhancement along the spinal canal or within the vertebral bodies. SPINAL CORD: The conus terminates at the L1 level. SOFT TISSUES: No paraspinal mass. There is a partially visualized uterine fibroid which measures at least 5.2 cm in diameter. This is consistent with findings from prior CT abdomen and pelvis on 06/12/2024. T12-L1: No significant spinal canal or foraminal stenosis. L1-L2: No significant disc herniation. No significant spinal canal or foraminal stenosis. Mild facet arthrosis. L2-L3: No significant disc herniation. No significant spinal canal stenosis or foraminal stenosis. Mild to moderate facet arthrosis and thickening of the ligamentum flavum. L3-L4: No significant disc herniation. No significant spinal canal stenosis or foraminal stenosis. Mild to moderate facet arthrosis and thickening of the ligamentum flavum. L4-L5: Small disc bulge. Mild to moderate facet arthrosis and thickening of the ligamentum flavum. Mild lateral recess narrowing. No significant spinal canal stenosis. No significant foraminal stenosis. There is a tiny cystic focus with mild associated enhancement along the posterior aspect of the left facets at L4-L5,  likely reflecting a small synovial cyst. L5-S1: Diffuse disc bulge. Moderate facet arthrosis and thickening of the ligamentum flavum. Lateral recess narrowing. Disc bulge abuts the traversing S1 nerve roots with moderate impingement of the nerve root on the right and mild impingement on the left. Tapering of the thecal sac. No significant foraminal stenosis. IMPRESSION: 1. Diffuse disc bulge at L5-S1 with lateral recess narrowing and moderate impingement of the right S1 nerve root and mild impingement of the left S1 nerve root. 2. Small disc bulge at L4-L5 with mild lateral recess narrowing. No significant spinal canal stenosis. 3. Tiny cystic focus with mild associated enhancement along the posterior aspect of the left facets at L4-L5, likely reflecting a small synovial cyst. Electronically signed by: Donnice Mania MD 08/17/2024 07:02 PM EST RP Workstation: [id number]   VAS US  LOWER EXTREMITY VENOUS (DVT) (ONLY [facility] & [facility]) Result Date: 08/17/2024  Lower Venous DVT Study Patient Name:  Beverly Johnson  Date of Exam:   08/17/2024 Medical Rec #: [id number]        Accession #:    [id number] Date of Birth: 06-19-1976        Patient Gender: F Patient Age:   75 years Exam Location:  [facility] Procedure:      VAS US  LOWER EXTREMITY VENOUS (DVT) Referring Phys: TINNIE MATTER --------------------------------------------------------------------------------  Indications: Pain.  Limitations: Poor ultrasound/tissue interface and body habitus. Comparison Study: Previous exam on 05/31/2020 was negative for DVT Performing Technologist: Ezzie Potters RVT, RDMS  Examination Guidelines: A complete evaluation includes B-mode imaging, spectral Doppler, color Doppler, and power Doppler as needed of all accessible portions of each vessel. Bilateral testing is considered an integral part of a complete examination. Limited examinations for reoccurring indications may be performed as noted. The reflux portion of the exam is performed with  the patient in reverse Trendelenburg.  +---------+---------------+---------+-----------+----------+--------------+ RIGHT    CompressibilityPhasicitySpontaneityPropertiesThrombus Aging +---------+---------------+---------+-----------+----------+--------------+ CFV      Full           Yes      Yes                                 +---------+---------------+---------+-----------+----------+--------------+ SFJ  Full                                                        +---------+---------------+---------+-----------+----------+--------------+ FV Prox  Full           Yes      Yes                                 +---------+---------------+---------+-----------+----------+--------------+ FV Mid   Full           Yes      Yes                                 +---------+---------------+---------+-----------+----------+--------------+ FV DistalFull           Yes      Yes                                 +---------+---------------+---------+-----------+----------+--------------+ PFV      Full                                                        +---------+---------------+---------+-----------+----------+--------------+ POP      Full           Yes      Yes                                 +---------+---------------+---------+-----------+----------+--------------+ PTV      Full                                                        +---------+---------------+---------+-----------+----------+--------------+ PERO     Full                                                        +---------+---------------+---------+-----------+----------+--------------+   +--------+---------------+---------+-----------+----------+--------------------+ LEFT    CompressibilityPhasicitySpontaneityPropertiesThrombus Aging       +--------+---------------+---------+-----------+----------+--------------------+ CFV     Full           Yes      Yes                                        +--------+---------------+---------+-----------+----------+--------------------+ SFJ     Full                                                              +--------+---------------+---------+-----------+----------+--------------------+  FV Prox Full           Yes      Yes                                       +--------+---------------+---------+-----------+----------+--------------------+ FV Mid  Full           Yes      Yes                                       +--------+---------------+---------+-----------+----------+--------------------+ FV                     Yes      Yes                  patent by            Distal                                               color/doppler        +--------+---------------+---------+-----------+----------+--------------------+ PFV                    Yes      Yes                  patent by                                                                 color/doppler        +--------+---------------+---------+-----------+----------+--------------------+ POP     Full                                                              +--------+---------------+---------+-----------+----------+--------------------+ PTV     Full                                         Not well visualized  +--------+---------------+---------+-----------+----------+--------------------+ PERO    Full                                         Not well visualized  +--------+---------------+---------+-----------+----------+--------------------+     Summary: BILATERAL: - No evidence of deep vein thrombosis seen in the lower extremities, bilaterally. -No evidence of popliteal cyst, bilaterally. -Ultrasound characteristics of enlarged lymph nodes are noted in the groin.   *See table(s) above for measurements and observations.    Preliminary       Medications Ordered in the ED  lidocaine  (LIDODERM ) 5 % 2 patch (2 patches Transdermal  Patch Applied 08/17/24 1610)  LORazepam (ATIVAN) tablet  1 mg (has no administration in time range)  oxyCODONE -acetaminophen  (PERCOCET/ROXICET) 5-325 MG per tablet 1 tablet (1 tablet Oral Given 08/17/24 1610)  methocarbamol  (ROBAXIN ) tablet 500 mg (500 mg Oral Given 08/17/24 1610)  gadobutrol  (GADAVIST ) 1 MMOL/ML injection 10 mL (10 mLs Intravenous Contrast Given 08/17/24 1811)  cefTRIAXone  (ROCEPHIN ) 2 g in sodium chloride  0.9 % 100 mL IVPB (0 g Intravenous Stopped 08/17/24 2040)                                    Medical Decision Making Amount and/or Complexity of Data Reviewed Labs: ordered. Radiology: ordered.  Risk Prescription drug management.   Patient presents to the ED for concern of low back pain, bilateral leg pain, this involves an extensive number of treatment options, and is a complaint that carries with it a high risk of complications and morbidity.  The differential diagnosis includes cauda equina, sciatica, DVT, epidural abscess, muscle strain, traumatic injury   Co morbidities that complicate the patient evaluation  Detectable HIV infection See HPI   Additional history obtained:  Additional history obtained from Nursing   External records from outside source obtained and reviewed including triage RN note    Lab Tests:  I Ordered, and personally interpreted labs.  The pertinent results include:   CBG 196 WBC 3.1 lactic acid WNL Hgb 9.6 UA positive for infection   Imaging Studies ordered:  I ordered imaging studies including MRI lumbar spine, DVT US  I independently visualized and interpreted imaging which showed  MRI: Diffuse disc bulge at L5-S1 with lateral recess narrowing and moderate impingement of the right S1 nerve root and mild impingement of the left S1 nerve root. Small disc bulge at L4-L5 with mild lateral recess narrowing. No significant spinal canal stenosis. Tiny cystic focus with mild associated enhancement along the posterior aspect of the left  facets at L4-L5, likely reflecting a small synovial cyst US : no DVT bilaterally I agree with the radiologist interpretation     Medicines ordered and prescription drug management:  I ordered medication including Rocephin , Keflex , Robaxin , lidocaine  patch, Percocet for back pain, UTI Reevaluation of the patient after these medicines showed that the patient improved I have reviewed the patients home medicines and have made adjustments as needed     Problem List / ED Course:  Lumbar back pain Tenderness to lumbar midline spinous tenderness and paraspinous musculature in lumbar region No complaints of urinary incontinence, saddle paresthesia. Neurovascularly intact to BLE Has hx of HIV.  CD4 count from 01/03/2024 are WNL.  HIV level are low but still detectable. With this history and initial borderline temp of 99.37F, will obtain labs and MRI ensure no spinal abscess Provided Percocet, Robaxin , lidocaine  patch for pain with some improvement Labs with no leukocytosis.  No elevated lactic acid.  No signs of sepsis.  hCG negative MRI lumbar spine without abscess, cauda equina.  Does show disc bulge with small compression on nerves likely contributing to lumbar radiculopathy.   Pain likely secondary to lumbar radiculopathy, UTI Provided neurosurgery outpatient follow-up  UTI No complaints of urinary symptoms other than back pain Provided Rocephin  in ED for UTI Keflex  prescription Urine culture pending  Bilateral leg pain No known traumatic injury No gross swelling, warmth, erythema to BLE. No signs of infection, cellulitis to BLE Compartments soft.  Low suspicion for compartment syndrome Legs are well-perfused.  DP 2+ bilaterally.  Sensation 2/2 BLE.  Low  suspicion for ischemic limb DVT ultrasound without DVT nor acute abnormalities.  No complaints of chest pain, shortness of breath.  No suspicion for PE.   Reevaluation:  After the interventions noted above, I reevaluated the  patient and found that they have :improved    Dispostion:  After consideration of the diagnostic results and the patients response to treatment, I feel that the patent would benefit from outpatient management symptomatic treatment.   Discussed ED workup, disposition, return to ED precautions with patient who expresses understanding agrees with plan.  All questions answered to their satisfaction.  They are agreeable to plan.  Discharge instructions provided on paperwork  Final diagnoses:  Sciatica, unspecified laterality  Bilateral leg pain  Urinary tract infection with hematuria, site unspecified    ED Discharge Orders          Ordered    cephALEXin  (KEFLEX ) 500 MG capsule  4 times daily        08/17/24 2006             Minnie Tinnie BRAVO, [place] 08/17/24 2339  "

## 2024-08-19 LAB — URINE CULTURE: Culture: >=100000 — AB

## 2024-08-20 ENCOUNTER — Telehealth (HOSPITAL_BASED_OUTPATIENT_CLINIC_OR_DEPARTMENT_OTHER): Payer: Self-pay | Admitting: *Deleted

## 2024-08-20 NOTE — Telephone Encounter (Signed)
 Post ED Visit - Positive Culture Follow-up  Culture report reviewed by antimicrobial stewardship pharmacist: [facility] []  Rankin Dee, Pharm.D. []  Venetia Gully, Pharm.D., BCPS AQ-ID []  Garrel Crews, Pharm.D., BCPS []  Almarie Lunger, [address].D., BCPS []  [place], [place].D., BCPS, AAHIVP []  Rosaline Bihari, Pharm.D., BCPS, AAHIVP []  Vernell Meier, PharmD, BCPS []  Latanya Hint, PharmD, BCPS []  Donald Medley, PharmD, BCPS []  Rocky Bold, PharmD []  Dorothyann Alert, PharmD, BCPS []  Morene Babe, PharmD  [facility] []  Rosaline Edison, PharmD []  Romona Bliss, PharmD []  Dolphus Roller, PharmD []  Veva Seip, Rph []  Vernell Daunt) Leonce, PharmD []  Eva Allis, PharmD []  Rosaline Millet, PharmD []  Iantha Batch, PharmD []  Arvin Gauss, PharmD []  Wanda Hasting, PharmD [x]  Ronal Rav, PharmD []  Rocky Slade, PharmD []  Bard Jeans, PharmD   Positive urine culture Treated with Cephalexin , organism sensitive to the same and no further patient follow-up is required at this time.  Beverly Johnson 08/20/2024, 12:42 PM

## 2024-08-21 ENCOUNTER — Ambulatory Visit: Payer: Self-pay

## 2024-08-21 ENCOUNTER — Other Ambulatory Visit (HOSPITAL_COMMUNITY): Payer: Self-pay

## 2024-08-21 NOTE — Telephone Encounter (Addendum)
 FYI Only or Action Required?: FYI only for provider: ED advised.  Patient was last seen in primary care on 07/26/2024 by Beverly Johnson, Beverly R, FNP.  Called Nurse Triage reporting Leg Pain.  Symptoms began several weeks ago.  Interventions attempted: OTC medications: icy hot, Rest, hydration, or home remedies, and Other: went to the ER 08/17/2024.  Symptoms are: gradually worsening.  Triage Disposition: See HCP Within 4 Hours (Or PCP Triage)  Patient/caregiver understands and will follow disposition?: Yes--patient states she is going to try and go back to the ER             Copied from CRM #[id number]. Topic: Clinical - Red Word Triage >> Aug 21, 2024  3:04 PM Rachelle Johnson wrote: Red Word that prompted transfer to Nurse Triage: Patient states she is having pain in her legs, has been ongoing for the last few weeks, went to the hospital and they said there was nothing wrong but is still having pain. Reason for Disposition  [1] SEVERE pain (e.g., excruciating, unable to do any normal activities) AND [2] not improved after 2 hours of pain medicine  Answer Assessment - Initial Assessment Questions Patient states that her legs have been bothering her for the past few weeks She doesn't know if it has anything with the shoes she has been wearing. Pain goes down both legs and a little bit of pain in lower back. Patient states the pain is usually around her knees  Patient denies chest pain, difficulty breathing, fevers, dark urine, chance of pregnancy Patient states her legs  Warm water with epsom salt, icy hot, and nothing helps Patient states that she doesn't know what is wrong with her legs but she is in a lot of pain and nothing is helping. Patient also states she has been taking the medication for the urinary symptoms. She is asked if she feels like the pain is worsening and she states she isnt sure and that she has never experienced pain like this. Patient is advised that if she is in  severe pain and nothing has helped so far that she has tried---it is recommended that she goes to Urgent Care back to the ER (and back to the ER especially if she feels like the pain is worse). Patient states that she will try to go back to the Emergency Room. She is advised to call us  back at any point with any further questions/concerns/changes and if anything worsens she can call 911 at any point. Patient verbalized understanding.  Protocols used: Leg Pain-A-AH

## 2024-08-23 ENCOUNTER — Other Ambulatory Visit: Payer: Self-pay | Admitting: Infectious Diseases

## 2024-08-23 ENCOUNTER — Other Ambulatory Visit: Payer: Self-pay | Admitting: Obstetrics and Gynecology

## 2024-08-23 DIAGNOSIS — N920 Excessive and frequent menstruation with regular cycle: Secondary | ICD-10-CM

## 2024-08-23 DIAGNOSIS — B20 Human immunodeficiency virus [HIV] disease: Secondary | ICD-10-CM

## 2024-08-24 NOTE — Telephone Encounter (Signed)
Appt 1/13 °

## 2024-08-29 ENCOUNTER — Ambulatory Visit: Payer: Self-pay | Admitting: Infectious Diseases

## 2024-08-30 ENCOUNTER — Encounter: Payer: Self-pay | Admitting: Nurse Practitioner

## 2024-08-30 ENCOUNTER — Ambulatory Visit: Payer: Self-pay | Admitting: Nurse Practitioner

## 2024-08-30 VITALS — BP 139/74 | HR 97 | Temp 97.0°F | Wt 268.0 lb

## 2024-08-30 DIAGNOSIS — M5416 Radiculopathy, lumbar region: Secondary | ICD-10-CM | POA: Diagnosis not present

## 2024-08-30 DIAGNOSIS — N3000 Acute cystitis without hematuria: Secondary | ICD-10-CM | POA: Diagnosis not present

## 2024-08-30 DIAGNOSIS — D500 Iron deficiency anemia secondary to blood loss (chronic): Secondary | ICD-10-CM | POA: Diagnosis not present

## 2024-08-30 MED ORDER — GABAPENTIN 300 MG PO CAPS: 300.0000 mg | ORAL_CAPSULE | Freq: Three times a day (TID) | ORAL | 3 refills | Status: AC

## 2024-08-30 MED ORDER — ACETAMINOPHEN-CODEINE 300-30 MG PO TABS: 1.0000 | ORAL_TABLET | Freq: Four times a day (QID) | ORAL | 0 refills | Status: AC | PRN

## 2024-08-30 NOTE — Patient Instructions (Addendum)
 Neurosurgery Contact: [address] [place] [address] [place] [address] [phone]    1. Acute lumbar radiculopathy (Primary) - gabapentin  (NEURONTIN ) 300 MG capsule; Take 1 capsule (300 mg total) by mouth 3 (three) times daily.  Dispense: 90 capsule; Refill: 3 - acetaminophen -codeine  (TYLENOL  #3) 300-30 MG tablet; Take 1 tablet by mouth every 6 (six) hours as needed for moderate pain (pain score 4-6).  Dispense: 12 tablet; Refill: 0    It is important that you exercise regularly at least 30 minutes 5 times a week as tolerated  Think about what you will eat, plan ahead. Choose  clean, green, fresh or frozen over canned, processed or packaged foods which are more sugary, salty and fatty. 70 to 75% of food eaten should be vegetables and fruit. Three meals at set times with snacks allowed between meals, but they must be fruit or vegetables. Aim to eat over a 12 hour period , example 7 am to 7 pm, and STOP after  your last meal of the day. Drink water,generally about 64 ounces per day, no other drink is as healthy. Fruit juice is best enjoyed in a healthy way, by EATING the fruit.  Thanks for choosing [facility] we consider it a privelige to serve you.

## 2024-08-30 NOTE — Progress Notes (Signed)
 "  Acute Office Visit  Subjective:     Patient ID: Beverly Johnson, female    DOB: 01/27/1976, 49 y.o.   MRN: [id number]  Chief Complaint  Patient presents with   Back Pain   Leg Pain    HPI    Discussed the use of AI scribe software for clinical note transcription with the patient, who gave verbal consent to proceed.  History of Present Illness The patient presents with bilateral leg pain and back pain.  She has been experiencing pain in both legs, with more severe pain in one leg, since last year. The pain extends from the back down to the ankles and is described as severe, with aching and shooting sensations. It is primarily internal.  Earlier this month, she visited the emergency room for the same pain and underwent an MRI, which revealed . Diffuse disc bulge at L5-S1 with lateral recess narrowing and moderate impingement of the right S1 nerve root and mild impingement of the left S1 nerve root. 2. Small disc bulge at L4-L5 with mild lateral recess narrowing. No significant spinal canal stenosis. 3. Tiny cystic focus with mild associated enhancement along the posterior aspect of the left facets at L4-L5, likely reflecting a small synovial cyst.    She was previously prescribed gabapentin  100 mg three times daily for neuropathy, but she is unsure if she has been taking it. She was also treated for a urinary tract infection and has three antibiotic pills remaining. No pain medication was provided to take home from the emergency room.  No numbness or tingling, but she reports severe pain that is difficult to describe.  Denies urinary or bowel incontinence  Assessment and Plan Assessment & Plan     Review of Systems  Constitutional:  Negative for appetite change, chills, fatigue and fever.  HENT:  Negative for congestion, postnasal drip, rhinorrhea and sneezing.   Respiratory:  Negative for cough, shortness of breath and wheezing.   Cardiovascular:  Negative for chest  pain, palpitations and leg swelling.  Gastrointestinal:  Negative for abdominal pain, constipation, nausea and vomiting.  Genitourinary:  Negative for difficulty urinating, dysuria, flank pain and frequency.  Musculoskeletal:  Positive for arthralgias and back pain. Negative for joint swelling and myalgias.  Skin:  Negative for color change, pallor, rash and wound.  Neurological:  Negative for dizziness, facial asymmetry, weakness, numbness and headaches.  Psychiatric/Behavioral:  Negative for behavioral problems, confusion, self-injury and suicidal ideas.         Objective:    BP 139/74   Pulse 97   Temp (!) 97 F (36.1 C)   Wt 268 lb (121.6 kg)   SpO2 100%   BMI 40.75 kg/m    Physical Exam Vitals and nursing note reviewed.  Constitutional:      General: She is not in acute distress.    Appearance: Normal appearance. She is obese. She is not ill-appearing, toxic-appearing or diaphoretic.  HENT:     Mouth/Throat:     Mouth: Mucous membranes are moist.     Pharynx: Oropharynx is clear. No oropharyngeal exudate or posterior oropharyngeal erythema.  Eyes:     General: No scleral icterus.       Right eye: No discharge.        Left eye: No discharge.     Extraocular Movements: Extraocular movements intact.     Conjunctiva/sclera: Conjunctivae normal.  Cardiovascular:     Rate and Rhythm: Normal rate and regular rhythm.  Pulses: Normal pulses.     Heart sounds: Normal heart sounds. No murmur heard.    No friction rub. No gallop.  Pulmonary:     Effort: Pulmonary effort is normal. No respiratory distress.     Breath sounds: Normal breath sounds. No stridor. No wheezing, rhonchi or rales.  Chest:     Chest wall: No tenderness.  Abdominal:     General: There is no distension.     Palpations: Abdomen is soft.     Tenderness: There is no abdominal tenderness. There is no right CVA tenderness, left CVA tenderness or guarding.  Musculoskeletal:        General: Tenderness  present. No swelling, deformity or signs of injury.     Right lower leg: No edema.     Left lower leg: No edema.     Comments: Tenderness on palpation of mid low back  Skin:    General: Skin is warm and dry.     Capillary Refill: Capillary refill takes less than 2 seconds.     Coloration: Skin is not jaundiced or pale.     Findings: No bruising, erythema or lesion.  Neurological:     Mental Status: She is alert and oriented to person, place, and time.     Motor: No weakness.     Coordination: Coordination normal.     Gait: Gait normal.  Psychiatric:        Mood and Affect: Mood normal.        Behavior: Behavior normal.        Thought Content: Thought content normal.        Judgment: Judgment normal.     Comments: Patient was tearful     No results found for any visits on 08/30/24.      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 Pain affects daily activities. - Increased gabapentin  to 300 mg three times daily. - Prescribed short course of Tylenol  3 for severe pain until neurosurgical evaluation. - Provided neurosurgeon contact for follow-up in [place]. - Advised immediate neurosurgeon appointment scheduling. - Offered work notes for time off due to pain.       Relevant Medications   gabapentin  (NEURONTIN ) 300 MG capsule   acetaminophen -codeine  (TYLENOL  #3) 300-30 MG tablet     Genitourinary   Acute cystitis   Urinary tract infection Recent UTI treated with antibiotics, three pills remaining. - Advised to complete full course of Keflex  ordered        Other   Anemia   Will follow up on this at her upcoming appointment , currently on Genvoya , will discuss referral to hematology for iron  infusion  Drug-Drug: Genvoya   and ferrous sulfateThe absorption and resultant effects of elvitegravir may be decreased when coadministered with polyvalent cation-containing supplements. Dose separation is recommended. Details  Lab Results  Component Value Date   WBC 3.1 (L) 08/17/2024   HGB 9.6 (L) 08/17/2024   HCT 32.1 (L) 08/17/2024   MCV 74.8 (L) 08/17/2024   PLT 176 08/17/2024          Meds ordered this encounter  Medications   gabapentin  (NEURONTIN ) 300 MG capsule    Sig: Take 1 capsule (300 mg total) by mouth 3 (three) times daily.  Dispense:  90 capsule    Refill:  3   acetaminophen -codeine  (TYLENOL  #3) 300-30 MG tablet    Sig: Take 1 tablet by mouth every 6 (six) hours as needed for moderate pain (pain score 4-6).    Dispense:  12 tablet    Refill:  0    Return for DM.  Aamari Strawderman R Naysa Puskas, FNP  "

## 2024-08-30 NOTE — Assessment & Plan Note (Addendum)
 Will follow up on this at her upcoming appointment , currently on Genvoya , will discuss referral to hematology for iron  infusion  Drug-Drug: Genvoya  and ferrous sulfateThe absorption and resultant effects of elvitegravir may be decreased when coadministered with polyvalent cation-containing supplements. Dose separation is recommended. Details  Lab Results  Component Value Date   WBC 3.1 (L) 08/17/2024   HGB 9.6 (L) 08/17/2024   HCT 32.1 (L) 08/17/2024   MCV 74.8 (L) 08/17/2024   PLT 176 08/17/2024

## 2024-08-30 NOTE — Assessment & Plan Note (Signed)
 Urinary tract infection Recent UTI treated with antibiotics, three pills remaining. - Advised to complete full course of Keflex  ordered

## 2024-08-30 NOTE — Assessment & Plan Note (Signed)
 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 Pain affects daily activities. - Increased gabapentin  to 300 mg three times daily. - Prescribed short course of Tylenol  3 for severe pain until neurosurgical evaluation. - Provided neurosurgeon contact for follow-up in [place]. - Advised immediate neurosurgeon appointment scheduling. - Offered work notes for time off due to pain.

## 2024-09-05 ENCOUNTER — Ambulatory Visit (INDEPENDENT_AMBULATORY_CARE_PROVIDER_SITE_OTHER): Payer: Self-pay | Admitting: Nurse Practitioner

## 2024-09-05 ENCOUNTER — Encounter: Payer: Self-pay | Admitting: Nurse Practitioner

## 2024-09-05 VITALS — BP 128/79 | HR 98 | Wt 269.0 lb

## 2024-09-05 DIAGNOSIS — D500 Iron deficiency anemia secondary to blood loss (chronic): Secondary | ICD-10-CM

## 2024-09-05 DIAGNOSIS — E1165 Type 2 diabetes mellitus with hyperglycemia: Secondary | ICD-10-CM | POA: Diagnosis not present

## 2024-09-05 DIAGNOSIS — M5416 Radiculopathy, lumbar region: Secondary | ICD-10-CM | POA: Diagnosis not present

## 2024-09-05 DIAGNOSIS — E785 Hyperlipidemia, unspecified: Secondary | ICD-10-CM

## 2024-09-05 MED ORDER — IBUPROFEN 600 MG PO TABS: 600.0000 mg | ORAL_TABLET | Freq: Three times a day (TID) | ORAL | 0 refills | Status: DC | PRN

## 2024-09-05 NOTE — Assessment & Plan Note (Signed)
 Lab Results  Component Value Date   CHOL 130 12/24/2023   HDL 52 12/24/2023   LDLCALC 61 12/24/2023   TRIG 83 12/24/2023   CHOLHDL 2.5 12/24/2023   Managed with atorvastatin  20 mg daily. - Continue atorvastatin  20 mg daily.

## 2024-09-05 NOTE — Assessment & Plan Note (Signed)
 Acute lumbar radiculopathy Chronic lower back pain radiating to the left leg due to lumbar radiculopathy. MRI shows nerve compression with a small disc bulge and mild impingement. Current medications ineffective. - Continue gabapentin  300 mg oral tid. - Prescribed Tylenol  3 for pain management as needed. - Prescribed prescription strength ibuprofen  tid as needed. - Advised to keep appointment with neurosurgery on January 27th.

## 2024-09-05 NOTE — Assessment & Plan Note (Addendum)
 Lab Results  Component Value Date   HGBA1C 8.7 (A) 07/26/2024    Type 2 diabetes mellitus Suboptimal glycemic control with A1c of 8.7. Inconsistent blood sugar monitoring. Current insulin  regimen may need adjustment. Discussed risks of complications if control not improved. - Encouraged regular blood sugar monitoring, aiming for fasting levels 80-120 mg/dL and postprandial levels around 170 mg/dL. - Advised to report fasting blood sugar levels via MyChart for potential insulin  dose adjustment as needed.  Increase Semglee  to 38 units if fasting blood sugar is greater then 130 continue Ozempic  2 mg once weekly - Discussed lifestyle modifications including low-carb diet, avoiding sugar, sweets, and soda, and staying active as tolerated. Follow-up in 2 months

## 2024-09-05 NOTE — Patient Instructions (Addendum)
 Goal for fasting blood sugar ranges from 80 to 120 and 2 hours after any meal or at bedtime should be between 130 to 170.   Pleas Increase semglee  to 38 units daily if your fasting blood sugar is greter than 130   .1. Dyslipidemia, goal LDL below 70 (Primary) - Direct LDL  2. Iron  deficiency anemia due to chronic blood loss - Iron , TIBC and Ferritin Panel  3. Acute lumbar radiculopathy - ibuprofen  (ADVIL ) 600 MG tablet; Take 1 tablet (600 mg total) by mouth every 8 (eight) hours as needed.  Dispense: 20 tablet; Refill: 0     It is important that you exercise regularly at least 30 minutes 5 times a week as tolerated  Think about what you will eat, plan ahead. Choose  clean, green, fresh or frozen over canned, processed or packaged foods which are more sugary, salty and fatty. 70 to 75% of food eaten should be vegetables and fruit. Three meals at set times with snacks allowed between meals, but they must be fruit or vegetables. Aim to eat over a 12 hour period , example 7 am to 7 pm, and STOP after  your last meal of the day. Drink water,generally about 64 ounces per day, no other drink is as healthy. Fruit juice is best enjoyed in a healthy way, by EATING the fruit.  Thanks for choosing [facility] we consider it a privelige to serve you.

## 2024-09-05 NOTE — Assessment & Plan Note (Signed)
 Lab Results  Component Value Date   IRON  17 (L) 04/30/2022   TIBC 456 (H) 04/30/2022   FERRITIN 3 (L) 04/30/2022   Iron  deficiency anemia due to chronic blood loss Anemia likely from chronic blood loss due to heavy menstrual cycles. Hemoglobin at 9.6. Iron  supplementation contraindicated due to Genvoya . - Ordered iron  panel to assess iron  levels. - Recommended iron  infusion if iron  levels are low. - Continue tranexamic acid  as needed to reduce heavy menstrual bleeding.

## 2024-09-05 NOTE — Progress Notes (Signed)
 "  Established Patient Office Visit  Subjective:  Patient ID: Beverly Johnson, female    DOB: 1976/06/09  Age: 49 y.o. MRN: [id number]  CC:  Chief Complaint  Patient presents with   Medical Management of Chronic Issues   Diabetes    HPI    Discussed the use of AI scribe software for clinical note transcription with the patient, who gave verbal consent to proceed.  History of Present Illness Beverly Johnson is a 49 year old female  has a past medical history of Back pain, DM2 (diabetes mellitus, type 2) (HCC), HIV infection (HCC), Human immunodeficiency virus (HIV) disease (HCC) (09/29/2006), Menorrhagia (11/05/2022), Microalbuminuria due to type 2 diabetes mellitus (HCC), Obesity (04/27/2012), Primary hypertension (03/02/2023), Uncontrolled type 2 diabetes mellitus with hyperglycemia (HCC) (03/02/2023), and Vitamin D  deficiency (03/21/2017).   who presents with persistent lower back and left leg pain.  She experiences persistent pain radiating from her lower back to her left leg, described as intermittent but not improving. The pain is localized towards the bottom of her back on the left side. Previous imaging, including an MRI, was performed. The patient recalls being told by the clinicians that nothing was wrong, and she was referred to a  neurosurgeon. She is currently taking gabapentin  300 mg three times daily and Tylenol  3 1 tablet every 6 hours as needed for pain management, but none of her medications are providing relief. She has also been prescribed Tylenol  3 for pain, which is not effective. The pain impacts her ability to stay active, which she attributes to her leg pain.  Her diabetes is managed with Semglee  at a dose of 34 units and Ozempic  2 mg. She reports a blood sugar reading of 150 mg/dL but is unsure if it was fasting. She rarely checks her blood sugar levels, making it difficult to adjust her insulin  dosage. Her last A1c was 8.7% a month ago.  She is anemic, with a recent  hemoglobin count of 9.6 g/dL. She experiences heavy menstrual bleeding, although her cycle has reduced from seven days to three days. She takes tranexamic acid  as needed to manage the bleeding. Due to her medication Genvoya , she cannot take oral iron  supplements. She has a history of fibroids, which have contributed to her heavy menstrual cycles.  Assessment & Plan     Lab Results  Component Value Date   HGBA1C 8.7 (A) 07/26/2024     Past Medical History:  Diagnosis Date   Back pain    DM2 (diabetes mellitus, type 2) (HCC)    HIV infection (HCC)    Human immunodeficiency virus (HIV) disease (HCC) 09/29/2006   Qualifier: Diagnosis of   By: Janna MD, Kelly         Menorrhagia 11/05/2022   Microalbuminuria due to type 2 diabetes mellitus (HCC)    Obesity 04/27/2012   Current weight 279lb 04/27/12     Primary hypertension 03/02/2023   Uncontrolled type 2 diabetes mellitus with hyperglycemia (HCC) 03/02/2023   Vitamin D  deficiency 03/21/2017    Past Surgical History:  Procedure Laterality Date   CESAREAN SECTION     x3   CHOLECYSTECTOMY  06/29/2011   Procedure: LAPAROSCOPIC CHOLECYSTECTOMY WITH INTRAOPERATIVE CHOLANGIOGRAM;  Surgeon: Deward GORMAN Curvin DOUGLAS, MD;  Location: [facility] OR;  Service: General;  Laterality: N/A;   TUBAL LIGATION      Family History  Problem Relation Age of Onset   Healthy Mother    Healthy Father    Breast cancer Neg Hx  Social History   Socioeconomic History   Marital status: Married    Spouse name: Not on file   Number of children: Not on file   Years of education: 10   Highest education level: Not on file  Occupational History    Employer: UNEMPLOYED  Tobacco Use   Smoking status: Never   Smokeless tobacco: Never  Vaping Use   Vaping status: Never Used  Substance and Sexual Activity   Alcohol use: No   Drug use: Not Currently    Types: Marijuana    Comment: CBD   Sexual activity: Yes    Partners: Male    Birth control/protection: None   Other Topics Concern   Not on file  Social History Narrative   Not on file   Social Drivers of Health   Tobacco Use: Low Risk (09/05/2024)   Patient History    Smoking Tobacco Use: Never    Smokeless Tobacco Use: Never    Passive Exposure: Not on file  Financial Resource Strain: Not on file  Food Insecurity: No Food Insecurity (08/30/2024)   Epic    Worried About Programme Researcher, Broadcasting/film/video in the Last Year: Never true    Ran Out of Food in the Last Year: Never true  Transportation Needs: No Transportation Needs (08/30/2024)   Epic    Lack of Transportation (Medical): No    Lack of Transportation (Non-Medical): No  Physical Activity: Not on file  Stress: Not on file  Social Connections: Not on file  Intimate Partner Violence: Not At Risk (08/30/2024)   Epic    Fear of Current or Ex-Partner: No    Emotionally Abused: No    Physically Abused: No    Sexually Abused: No  Depression (PHQ2-9): Low Risk (09/05/2024)   Depression (PHQ2-9)    PHQ-2 Score: 0  Alcohol Screen: Not on file  Housing: Low Risk (08/30/2024)   Epic    Unable to Pay for Housing in the Last Year: No    Number of Times Moved in the Last Year: 0    Homeless in the Last Year: No  Utilities: Not At Risk (08/30/2024)   Epic    Threatened with loss of utilities: No  Health Literacy: Not on file    Outpatient Medications Prior to Visit  Medication Sig Dispense Refill   Accu-Chek Softclix Lancets lancets 1 each 3 (three) times daily.     atorvastatin  (LIPITOR) 20 MG tablet TAKE 1 TABLET (20 MG TOTAL) BY MOUTH DAILY. 90 tablet 0   gabapentin  (NEURONTIN ) 300 MG capsule Take 1 capsule (300 mg total) by mouth 3 (three) times daily. 90 capsule 3   GENVOYA  150-150-200-10 MG TABS tablet TAKE 1 TABLET BY MOUTH DAILY WITH BREAKFAST. 30 tablet 0   GLOBAL EASE INJECT PEN NEEDLES 32G X 4 MM MISC      glucose blood (ACCU-CHEK GUIDE TEST) test strip 1 each by Other route in the morning and at bedtime. 100 each 4   glucose blood test  strip Check your sugar in the morning before you eat breakfast, and one hour after a meal. 100 each 2   insulin  glargine-yfgn (SEMGLEE ) 100 UNIT/ML Pen Inject 34 Units into the skin daily. 15 mL 4   linaclotide  (LINZESS ) 145 MCG CAPS capsule Take 1 capsule (145 mcg total) by mouth daily before breakfast. 60 capsule 2   omeprazole  (PRILOSEC  OTC) 20 MG tablet Take 1 tablet (20 mg total) by mouth daily. 30 tablet 2   RESTASIS 0.05 %  ophthalmic emulsion      Semaglutide , 2 MG/DOSE, (OZEMPIC , 2 MG/DOSE,) 8 MG/3ML SOPN INJECT TWO MG AS DIRECTED ONCE A WEEK. 6 mL 2   tranexamic acid  (LYSTEDA ) 650 MG TABS tablet TAKE TWO TABLETS BY MOUTH THREE TIMES A DAY . TAKE DURING MENSES FOR A MAX OF 5 DAYS 30 tablet 3   acetaminophen -codeine  (TYLENOL  #3) 300-30 MG tablet Take 1 tablet by mouth every 6 (six) hours as needed for moderate pain (pain score 4-6). 12 tablet 0   brompheniramine-pseudoephedrine -DM 30-2-10 MG/5ML syrup Take 10 mLs by mouth every 6 (six) hours as needed (cough and congestion). (Patient not taking: Reported on 08/30/2024) 120 mL 0   cephALEXin  (KEFLEX ) 500 MG capsule Take 1 capsule (500 mg total) by mouth 4 (four) times daily. (Patient not taking: Reported on 09/05/2024) 20 capsule 0   Continuous Glucose Sensor (FREESTYLE LIBRE 3 SENSOR) MISC Place 1 sensor on the skin every 14 days. Use to check glucose continuously (Patient not taking: Reported on 08/30/2024) 2 each 3   cyclobenzaprine  (FLEXERIL ) 10 MG tablet Take 1 tablet (10 mg total) by mouth at bedtime. (Patient not taking: Reported on 08/30/2024) 20 tablet 0   fluticasone  (FLONASE ) 50 MCG/ACT nasal spray Place 2 sprays into both nostrils daily. Shake well before use. Gently blow nose before spraying. Do not blow nose immediately after use. You should not taste the medication or feel it going down your throat; if you do, adjust your technique. (Patient not taking: Reported on 08/30/2024) 16 g 0   ibuprofen  (ADVIL ) 800 MG tablet Take 1 tablet (800  mg total) by mouth 3 (three) times daily. (Patient not taking: Reported on 08/30/2024) 15 tablet 0   No facility-administered medications prior to visit.    Allergies[1]  ROS Review of Systems  Constitutional:  Negative for appetite change, chills, fatigue and fever.  HENT:  Negative for congestion, postnasal drip, rhinorrhea and sneezing.   Respiratory:  Negative for cough, shortness of breath and wheezing.   Cardiovascular:  Negative for chest pain, palpitations and leg swelling.  Gastrointestinal:  Negative for abdominal pain, constipation, nausea and vomiting.  Genitourinary:  Negative for difficulty urinating, dysuria, flank pain and frequency.  Musculoskeletal:  Positive for arthralgias and back pain. Negative for joint swelling and myalgias.  Skin:  Negative for color change, pallor, rash and wound.  Neurological:  Negative for dizziness, facial asymmetry, weakness, numbness and headaches.  Psychiatric/Behavioral:  Negative for behavioral problems, confusion, self-injury and suicidal ideas.       Objective:    Physical Exam Vitals and nursing note reviewed.  Constitutional:      General: She is not in acute distress.    Appearance: Normal appearance. She is obese. She is not ill-appearing, toxic-appearing or diaphoretic.  HENT:     Mouth/Throat:     Mouth: Mucous membranes are moist.     Pharynx: Oropharynx is clear. No oropharyngeal exudate or posterior oropharyngeal erythema.  Eyes:     General: No scleral icterus.       Right eye: No discharge.        Left eye: No discharge.     Extraocular Movements: Extraocular movements intact.     Conjunctiva/sclera: Conjunctivae normal.  Cardiovascular:     Rate and Rhythm: Normal rate and regular rhythm.     Pulses: Normal pulses.     Heart sounds: Normal heart sounds. No murmur heard.    No friction rub. No gallop.  Pulmonary:     Effort: Pulmonary  effort is normal. No respiratory distress.     Breath sounds: Normal  breath sounds. No stridor. No wheezing, rhonchi or rales.  Chest:     Chest wall: No tenderness.  Abdominal:     General: There is no distension.     Palpations: Abdomen is soft.     Tenderness: There is no abdominal tenderness. There is no right CVA tenderness, left CVA tenderness or guarding.  Musculoskeletal:        General: Tenderness present. No swelling, deformity or signs of injury.     Right lower leg: No edema.     Left lower leg: No edema.  Skin:    General: Skin is warm and dry.     Capillary Refill: Capillary refill takes less than 2 seconds.     Coloration: Skin is not jaundiced or pale.     Findings: No bruising, erythema or lesion.  Neurological:     Mental Status: She is alert and oriented to person, place, and time.     Motor: No weakness.     Gait: Gait abnormal.     Comments: Using a cane for ambulation  Psychiatric:        Mood and Affect: Mood normal.        Behavior: Behavior normal.        Thought Content: Thought content normal.        Judgment: Judgment normal.     Comments: tearful     BP 128/79   Pulse 98   Wt 269 lb (122 kg)   SpO2 100%   BMI 40.90 kg/m  Wt Readings from Last 3 Encounters:  09/05/24 269 lb (122 kg)  08/30/24 268 lb (121.6 kg)  08/17/24 280 lb (127 kg)    Lab Results  Component Value Date   TSH 1.105 06/17/2019   Lab Results  Component Value Date   WBC 3.1 (L) 08/17/2024   HGB 9.6 (L) 08/17/2024   HCT 32.1 (L) 08/17/2024   MCV 74.8 (L) 08/17/2024   PLT 176 08/17/2024   Lab Results  Component Value Date   NA 137 08/17/2024   K 3.7 08/17/2024   CO2 24 08/17/2024   GLUCOSE 196 (H) 08/17/2024   BUN 7 08/17/2024   CREATININE 0.62 08/17/2024   BILITOT 0.5 06/12/2024   ALKPHOS 110 06/12/2024   AST 28 06/12/2024   ALT 11 06/12/2024   PROT 8.2 (H) 06/12/2024   ALBUMIN 3.7 06/12/2024   CALCIUM  9.0 08/17/2024   ANIONGAP 7 08/17/2024   EGFR 111 12/24/2023   Lab Results  Component Value Date   CHOL 130 12/24/2023    Lab Results  Component Value Date   HDL 52 12/24/2023   Lab Results  Component Value Date   LDLCALC 61 12/24/2023   Lab Results  Component Value Date   TRIG 83 12/24/2023   Lab Results  Component Value Date   CHOLHDL 2.5 12/24/2023   Lab Results  Component Value Date   HGBA1C 8.7 (A) 07/26/2024      Assessment & Plan:   Problem List Items Addressed This Visit       Endocrine   Uncontrolled type 2 diabetes mellitus with hyperglycemia (HCC)   Lab Results  Component Value Date   HGBA1C 8.7 (A) 07/26/2024    Type 2 diabetes mellitus Suboptimal glycemic control with A1c of 8.7. Inconsistent blood sugar monitoring. Current insulin  regimen may need adjustment. Discussed risks of complications if control not improved. - Encouraged regular blood sugar  monitoring, aiming for fasting levels 80-120 mg/dL and postprandial levels around 170 mg/dL. - Advised to report fasting blood sugar levels via MyChart for potential insulin  dose adjustment as needed.  Increase Semglee  to 38 units if fasting blood sugar is greater then 130 continue Ozempic  2 mg once weekly - Discussed lifestyle modifications including low-carb diet, avoiding sugar, sweets, and soda, and staying active as tolerated. Follow-up in 2 months        Nervous and Auditory   Acute lumbar radiculopathy   Acute lumbar radiculopathy Chronic lower back pain radiating to the left leg due to lumbar radiculopathy. MRI shows nerve compression with a small disc bulge and mild impingement. Current medications ineffective. - Continue gabapentin  300 mg oral tid. - Prescribed Tylenol  3 for pain management as needed. - Prescribed prescription strength ibuprofen  tid as needed. - Advised to keep appointment with neurosurgery on January 27th.        Relevant Medications   ibuprofen  (ADVIL ) 600 MG tablet     Other   Anemia   Lab Results  Component Value Date   IRON  17 (L) 04/30/2022   TIBC 456 (H) 04/30/2022   FERRITIN 3  (L) 04/30/2022   Iron  deficiency anemia due to chronic blood loss Anemia likely from chronic blood loss due to heavy menstrual cycles. Hemoglobin at 9.6. Iron  supplementation contraindicated due to Genvoya . - Ordered iron  panel to assess iron  levels. - Recommended iron  infusion if iron  levels are low. - Continue tranexamic acid  as needed to reduce heavy menstrual bleeding.       Relevant Orders   Iron , TIBC and Ferritin Panel   Dyslipidemia, goal LDL below 70 - Primary   Lab Results  Component Value Date   CHOL 130 12/24/2023   HDL 52 12/24/2023   LDLCALC 61 12/24/2023   TRIG 83 12/24/2023   CHOLHDL 2.5 12/24/2023   Managed with atorvastatin  20 mg daily. - Continue atorvastatin  20 mg daily.      Relevant Orders   Direct LDL    Meds ordered this encounter  Medications   ibuprofen  (ADVIL ) 600 MG tablet    Sig: Take 1 tablet (600 mg total) by mouth every 8 (eight) hours as needed.    Dispense:  20 tablet    Refill:  0    Follow-up: Return in about 2 months (around 11/03/2024) for DM.    Saverio Kader R Ghali Morissette, FNP     [1] No Known Allergies  "

## 2024-09-06 ENCOUNTER — Ambulatory Visit: Payer: Self-pay | Admitting: Nurse Practitioner

## 2024-09-06 ENCOUNTER — Other Ambulatory Visit: Payer: Self-pay | Admitting: Nurse Practitioner

## 2024-09-06 DIAGNOSIS — B20 Human immunodeficiency virus [HIV] disease: Secondary | ICD-10-CM

## 2024-09-06 LAB — IRON,TIBC AND FERRITIN PANEL
Ferritin: 8 ng/mL — ABNORMAL LOW (ref 15–150)
Iron Saturation: 8 % — CL (ref 15–55)
Iron: 32 ug/dL (ref 27–159)
Total Iron Binding Capacity: 397 ug/dL (ref 250–450)
UIBC: 365 ug/dL (ref 131–425)

## 2024-09-06 LAB — LDL CHOLESTEROL, DIRECT: LDL Direct: 51 mg/dL (ref 0–99)

## 2024-09-06 MED ORDER — ATORVASTATIN CALCIUM 20 MG PO TABS: 20.0000 mg | ORAL_TABLET | Freq: Every day | ORAL | 2 refills | Status: AC

## 2024-09-09 ENCOUNTER — Other Ambulatory Visit: Payer: Self-pay | Admitting: Nurse Practitioner

## 2024-09-09 DIAGNOSIS — K5900 Constipation, unspecified: Secondary | ICD-10-CM

## 2024-09-11 NOTE — Telephone Encounter (Signed)
 Please Advise.  CB.

## 2024-09-21 ENCOUNTER — Other Ambulatory Visit: Payer: Self-pay | Admitting: Nurse Practitioner

## 2024-09-21 DIAGNOSIS — M5416 Radiculopathy, lumbar region: Secondary | ICD-10-CM

## 2024-09-21 NOTE — Telephone Encounter (Signed)
 Please advise [facility]

## 2024-09-21 NOTE — Telephone Encounter (Signed)
 ibuprofen  (ADVIL ) 600 MG tablet [Pharmacy Med Name: IBUPROFEN  600MG  TAB]

## 2024-10-04 ENCOUNTER — Ambulatory Visit: Payer: Self-pay | Admitting: Infectious Diseases
